# Patient Record
Sex: Female | Born: 1960
Health system: Southern US, Community
[De-identification: ages and names within clinical notes are randomized; demographics above are authoritative.]

## PROBLEM LIST (undated history)

## (undated) DIAGNOSIS — Z8051 Family history of malignant neoplasm of kidney: Secondary | ICD-10-CM

## (undated) DIAGNOSIS — Z9221 Personal history of antineoplastic chemotherapy: Secondary | ICD-10-CM

## (undated) DIAGNOSIS — K219 Gastro-esophageal reflux disease without esophagitis: Secondary | ICD-10-CM

## (undated) DIAGNOSIS — R091 Pleurisy: Secondary | ICD-10-CM

## (undated) DIAGNOSIS — Z923 Personal history of irradiation: Secondary | ICD-10-CM

## (undated) DIAGNOSIS — E785 Hyperlipidemia, unspecified: Secondary | ICD-10-CM

## (undated) DIAGNOSIS — M810 Age-related osteoporosis without current pathological fracture: Secondary | ICD-10-CM

## (undated) DIAGNOSIS — R519 Headache, unspecified: Secondary | ICD-10-CM

## (undated) DIAGNOSIS — Z808 Family history of malignant neoplasm of other organs or systems: Secondary | ICD-10-CM

## (undated) DIAGNOSIS — C50919 Malignant neoplasm of unspecified site of unspecified female breast: Secondary | ICD-10-CM

## (undated) DIAGNOSIS — D649 Anemia, unspecified: Secondary | ICD-10-CM

## (undated) HISTORY — DX: Family history of malignant neoplasm of kidney: Z80.51

## (undated) HISTORY — DX: Hyperlipidemia, unspecified: E78.5

## (undated) HISTORY — DX: Family history of malignant neoplasm of other organs or systems: Z80.8

## (undated) HISTORY — PX: TUBAL LIGATION: SHX77

## (undated) HISTORY — DX: Age-related osteoporosis without current pathological fracture: M81.0

---

## 2015-05-13 DIAGNOSIS — R0602 Shortness of breath: Secondary | ICD-10-CM | POA: Insufficient documentation

## 2015-05-13 DIAGNOSIS — R079 Chest pain, unspecified: Secondary | ICD-10-CM | POA: Insufficient documentation

## 2018-02-23 HISTORY — PX: FRACTURE SURGERY: SHX138

## 2018-03-29 ENCOUNTER — Other Ambulatory Visit: Payer: Self-pay | Admitting: Obstetrics and Gynecology

## 2018-03-29 DIAGNOSIS — N6320 Unspecified lump in the left breast, unspecified quadrant: Secondary | ICD-10-CM

## 2018-03-30 ENCOUNTER — Other Ambulatory Visit: Payer: Self-pay | Admitting: Obstetrics and Gynecology

## 2018-03-30 DIAGNOSIS — N6320 Unspecified lump in the left breast, unspecified quadrant: Secondary | ICD-10-CM

## 2018-04-01 ENCOUNTER — Other Ambulatory Visit: Payer: Self-pay | Admitting: Obstetrics and Gynecology

## 2018-04-01 ENCOUNTER — Ambulatory Visit
Admission: RE | Admit: 2018-04-01 | Discharge: 2018-04-01 | Disposition: A | Discharge status: Home / Self Care | Payer: BLUE CROSS/BLUE SHIELD | Source: Ambulatory Visit | Attending: Obstetrics and Gynecology | Admitting: Obstetrics and Gynecology

## 2018-04-01 DIAGNOSIS — N6320 Unspecified lump in the left breast, unspecified quadrant: Secondary | ICD-10-CM

## 2018-04-01 DIAGNOSIS — R599 Enlarged lymph nodes, unspecified: Secondary | ICD-10-CM

## 2018-04-06 ENCOUNTER — Ambulatory Visit
Admission: RE | Admit: 2018-04-06 | Discharge: 2018-04-06 | Disposition: A | Discharge status: Home / Self Care | Payer: BLUE CROSS/BLUE SHIELD | Source: Ambulatory Visit | Attending: Obstetrics and Gynecology | Admitting: Obstetrics and Gynecology

## 2018-04-06 ENCOUNTER — Other Ambulatory Visit: Payer: Self-pay | Admitting: Obstetrics and Gynecology

## 2018-04-06 DIAGNOSIS — N6320 Unspecified lump in the left breast, unspecified quadrant: Secondary | ICD-10-CM

## 2018-04-06 DIAGNOSIS — R599 Enlarged lymph nodes, unspecified: Secondary | ICD-10-CM

## 2018-04-08 ENCOUNTER — Encounter: Payer: Self-pay | Admitting: *Deleted

## 2018-04-08 ENCOUNTER — Telehealth: Payer: Self-pay | Admitting: Oncology

## 2018-04-08 NOTE — Telephone Encounter (Signed)
Spoke with patient to confirm morning [facility] appointment for 2/19, packet emailed to patient

## 2018-04-11 ENCOUNTER — Other Ambulatory Visit: Payer: Self-pay | Admitting: *Deleted

## 2018-04-11 DIAGNOSIS — C50412 Malignant neoplasm of upper-outer quadrant of left female breast: Secondary | ICD-10-CM

## 2018-04-11 DIAGNOSIS — Z171 Estrogen receptor negative status [ER-]: Principal | ICD-10-CM

## 2018-04-12 NOTE — Progress Notes (Signed)
[facility]  [phone] [phone]    ID: Kathryn [name] DOB: October 03, 1960  MR#: [id number]  GYF#:[id number]  Patient Care Team: Kandace Blitz, MD as PCP - General (Obstetrics and Gynecology) Rockwell Germany, RN as Oncology Nurse Navigator Mauro Kaufmann, RN as Oncology Nurse Navigator Erroll Luna, MD as Consulting Physician (General Surgery) Dylin Ihnen, Virgie Dad, MD as Consulting Physician (Oncology) Kyung Rudd, MD as Consulting Physician (Radiation Oncology) OTHER MD: Rana Snare (OBGYN)   CHIEF COMPLAINT: Triple negative breast cancer  CURRENT TREATMENT: Neoadjuvant chemotherapy   HISTORY OF CURRENT ILLNESS: Kathryn [name] presented with a palpable subareolar left breast lump, nipple retraction, and discharge for approximately 1 week. She underwent bilateral diagnostic mammography with tomography and left breast ultrasonography at [facility] on 04/01/2018 showing: Breast Density Category C. An irregular hyperdense mass is seen in the left subareolar region. An additional oval, circumscribed mass is seen in the inferior central aspect at posterior depth. No additional suspicious findings are identified within the remainder of the left breast. On physical exam, there is a 2-3 cm firm, fixed lump in the subareolar region on the left. Subtle skin changes and retraction is noted along the left nipple. Targeted ultrasound is performed, showing an irregular hypoechoic mass with associated vascularity at the 12 o'clock retroareolar position on the left. Overall measurements are 2.6 x 2.3 x 1.2 cm. This corresponds with the mammographic finding. Two adjacent circumscribed hypoechoic masses are identified in the deep 6 o'clock position 2 cm from the nipple. They measure 0.8 x 0.6 x 0.4 cm and 1.3 x 1.1 x 0.7 cm. There is no seated vascularity. This corresponds with the additional mammographic finding and likely represents minimally complicated cyst. Evaluation  of the left axilla demonstrates a markedly enlarged abnormal lymph node with complete hilar replacement. It measures up to 5 cm in long axis dimension. No suspicious mammographic findings on the right.   Accordingly on 04/06/2018 she proceeded to biopsy of the left breast area in question. The pathology from this procedure showed ([id number]): invasive ductal carcinoma, grade III, with lymphovascular invasion. Prognostic indicators significant for: estrogen receptor, 0% negative and progesterone receptor, 0% negative. Proliferation marker Ki67 at 70%. HER2 equivocal (2+) by immunohistochemistry, but negative by FISH (print3ed report pending).  On the same day she underwent a biopsy of the left axillary lymph node on 04/06/2018 showing ([id number]): metastatic carcinoma in 1 of 1 lymph node (1/1).   The patient's subsequent history is as detailed below.   INTERVAL HISTORY: Kathryn [name] was evaluated in the multidisciplinary breast cancer clinic on 04/13/2018 accompanied by her husband, Kathryn [name], and her mother-in-law.   Her case was also presented at the multidisciplinary breast cancer conference on the same day. At that time a preliminary plan was proposed: Neoadjuvant chemotherapy, breast conserving surgery with targeted axillary dissection, port placement, adjuvant radiation, genetics, and referral to the upbeat trial     REVIEW OF SYSTEMS: Kynzley denies unusual headaches, visual changes, nausea, vomiting, stiff neck, dizziness, or gait imbalance. There has been no cough, phlegm production, or pleurisy, no chest pain or pressure, and no change in bowel or bladder habits. The patient denies fever, rash, bleeding, unexplained fatigue or unexplained weight loss. A detailed review of systems was otherwise entirely negative.   PAST MEDICAL HISTORY: No past medical history on file.   PAST SURGICAL HISTORY: Past Surgical History:  Procedure Laterality Date  . CESAREAN SECTION       FAMILY  HISTORY: Family History  Problem Relation  Age of Onset  . Kidney cancer Sister    Chennel's father died from congestive heart failure at age 48. Patients' mother is 56 as of 03/2018. The patient has 1 brother and 3 sisters. Patient denies anyone in her family having breast, ovarian, prostate, or pancreatic cancer. Kathryn's sister, Dyann [name], was diagnosed with Kidney Cancer at 12. Kathryn [name] has an uncle and a cousin that were diagnosed with lung cancer, but they were both heavy smokers.    GYNECOLOGIC HISTORY:  No LMP recorded. Patient is postmenopausal. 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 Her husband, Kathryn [name], owns [employer]. Together, they have three children, Kathryn [name], Kathryn [name], and Kathryn [name]. 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 Kathryn [name] is 74, lives in [place], and is a Equities trader at [name]. 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    ADVANCED DIRECTIVES: Kathryn [name]'s husband, Kathryn [name], is automatically her healthcare power of attorney.    HEALTH MAINTENANCE: Social History   Tobacco Use  . Smoking status: Never Smoker  Substance Use Topics  . Alcohol use: Not on file  . Drug use: Never    Colonoscopy: no  PAP: 2015  Bone density: no   No Known Allergies  No current outpatient medications on file.   No current facility-administered medications for this visit.      OBJECTIVE: Middle-aged white woman in no acute distress  Vitals:   04/13/18 0850  BP: (!) 156/79  Pulse: 91  Resp: 18  Temp: (!) 97.5 F (36.4 C)  SpO2: 100%     Body mass index is 27.27 kg/m.   Wt Readings from Last 3 Encounters:   04/13/18 160 lb 1.6 oz (72.6 kg)      ECOG FS:0 - Asymptomatic  Ocular: Sclerae unicteric, pupils round and equal Ear-nose-throat: Oropharynx clear and moist Lymphatic: No cervical or supraclavicular adenopathy Lungs no rales or rhonchi Heart regular rate and rhythm Abd soft, nontender, positive bowel sounds MSK no focal spinal tenderness, no joint edema Neuro: non-focal, well-oriented, appropriate affect Breasts: The right breast is unremarkable.  The left breast is imaged below.  There is considerable firmness in the upper outer quadrant area without well-defined edges.  Some of this may be secondary to the recent biopsy.  I do not palpate the axillary lymph node on the left.  Left breast 04/13/2018      LAB RESULTS:  CMP     Component Value Date/Time   NA 142 04/13/2018 0843   K 3.9 04/13/2018 0843   CL 106 04/13/2018 0843   CO2 28 04/13/2018 0843   GLUCOSE 92 04/13/2018 0843   BUN 9 04/13/2018 0843   CREATININE 0.83 04/13/2018 0843   CALCIUM 9.6 04/13/2018 0843   PROT 7.6 04/13/2018 0843   ALBUMIN 4.0 04/13/2018 0843   AST 15 04/13/2018 0843   ALT 16 04/13/2018 0843   ALKPHOS 106 04/13/2018 0843   BILITOT 0.5 04/13/2018 0843   GFRNONAA >60 04/13/2018 0843   GFRAA >60 04/13/2018 0843    No results found for: TOTALPROTELP, ALBUMINELP, A1GS, A2GS, BETS, BETA2SER, GAMS, MSPIKE, SPEI  No results found for: KPAFRELGTCHN, LAMBDASER, KAPLAMBRATIO  Lab Results  Component Value Date   WBC 5.2 04/13/2018   NEUTROABS 3.1 04/13/2018   HGB 13.7 04/13/2018   HCT 42.7 04/13/2018   MCV 91.6 04/13/2018   PLT 280 04/13/2018    '@LASTCHEMISTRY' @  No results found for: LABCA2  No components found for: GBMSXJ155  No results for input(s): INR in the last 168 hours.  No results found for: LABCA2  No results found for: MCE022  No results found for: VVK122  No results found for: ESL753  No results found for: CA2729  No components found for: HGQUANT  No results  found for: CEA1 / No results found for: CEA1   No results found for: AFPTUMOR  No results found for: CHROMOGRNA  No results found for: PSA1  Appointment on 04/13/2018  Component Date Value Ref Range Status  . Sodium 04/13/2018 142  135 - 145 mmol/L Final  . Potassium 04/13/2018 3.9  3.5 - 5.1 mmol/L Final  . Chloride 04/13/2018 106  98 - 111 mmol/L Final  . CO2 04/13/2018 28  22 - 32 mmol/L Final  . Glucose, Bld 04/13/2018 92  70 - 99 mg/dL Final  . BUN 04/13/2018 9  6 - 20 mg/dL Final  . Creatinine 04/13/2018 0.83  0.44 - 1.00 mg/dL Final  . Calcium 04/13/2018 9.6  8.9 - 10.3 mg/dL Final  . Total Protein 04/13/2018 7.6  6.5 - 8.1 g/dL Final  . Albumin 04/13/2018 4.0  3.5 - 5.0 g/dL Final  . AST 04/13/2018 15  15 - 41 U/L Final  . ALT 04/13/2018 16  0 - 44 U/L Final  . Alkaline Phosphatase 04/13/2018 106  38 - 126 U/L Final  . Total Bilirubin 04/13/2018 0.5  0.3 - 1.2 mg/dL Final  . GFR, Est Non Af Am 04/13/2018 >60  >60 mL/min Final  . GFR, Est AFR Am 04/13/2018 >60  >60 mL/min Final  . Anion gap 04/13/2018 8  5 - 15 Final   Performed at [facility], [place] [address], [place], [place] [address]  . WBC Count 04/13/2018 5.2  4.0 - 10.5 K/uL Final  . RBC 04/13/2018 4.66  3.87 - 5.11 MIL/uL Final  . Hemoglobin 04/13/2018 13.7  12.0 - 15.0 g/dL Final  . HCT 04/13/2018 42.7  36.0 - 46.0 % Final  . MCV 04/13/2018 91.6  80.0 - 100.0 fL Final  . MCH 04/13/2018 29.4  26.0 - 34.0 pg Final  . MCHC 04/13/2018 32.1  30.0 - 36.0 g/dL Final  . RDW 04/13/2018 13.0  11.5 - 15.5 % Final  . Platelet Count 04/13/2018 280  150 - 400 K/uL Final  . nRBC 04/13/2018 0.0  0.0 - 0.2 % Final  . Neutrophils Relative % 04/13/2018 61  % Final  . Neutro Abs 04/13/2018 3.1  1.7 - 7.7 K/uL Final  . Lymphocytes Relative 04/13/2018 28  % Final  . Lymphs Abs 04/13/2018 1.5  0.7 - 4.0 K/uL Final  . Monocytes Relative 04/13/2018 9  % Final  . Monocytes Absolute 04/13/2018 0.5  0.1 - 1.0  K/uL Final  . Eosinophils Relative 04/13/2018 1  % Final  . Eosinophils Absolute 04/13/2018 0.1  0.0 - 0.5 K/uL Final  . Basophils Relative 04/13/2018 1  % Final  . Basophils Absolute 04/13/2018 0.1  0.0 - 0.1 K/uL Final  . Immature Granulocytes 04/13/2018 0  % Final  . Abs Immature Granulocytes 04/13/2018 0.01  0.00 - 0.07 K/uL Final   Performed at [facility], [address] [place], [place] [address]    (this displays the last labs from the last 3 days)  No results found for: TOTALPROTELP, ALBUMINELP, A1GS, A2GS, BETS, BETA2SER, GAMS, MSPIKE, SPEI (this displays SPEP labs)  No results found for: KPAFRELGTCHN, LAMBDASER, KAPLAMBRATIO (kappa/lambda light chains)  No results found for: HGBA, HGBA2QUANT, HGBFQUANT, HGBSQUAN (Hemoglobinopathy evaluation)   No results found for: LDH  No results found for: IRON, TIBC, IRONPCTSAT (Iron and TIBC)  No results found for: FERRITIN  Urinalysis No results found for: COLORURINE, APPEARANCEUR, LABSPEC, PHURINE, GLUCOSEU, HGBUR, BILIRUBINUR, KETONESUR, PROTEINUR, UROBILINOGEN, NITRITE, LEUKOCYTESUR   STUDIES:  US Breast Ltd Uni Left Inc Axilla  Result Date: 04/01/2018 CLINICAL DATA:  58 year old female with palpable subareolar left breast lump, nipple retraction and discharge for approximately 1 week. EXAM: DIGITAL DIAGNOSTIC BILATERAL MAMMOGRAM WITH CAD AND TOMO ULTRASOUND LEFT BREAST COMPARISON:  Previous exam(s). ACR Breast Density Category c: The breast tissue is heterogeneously dense, which may obscure small masses. FINDINGS: An irregular hyperdense mass is seen in the left subareolar region. An additional oval, circumscribed mass is seen in the inferior central aspect at posterior depth. No additional suspicious findings are identified within the remainder of the left breast. No suspicious mammographic findings are noted on the right. Mammographic images were processed with CAD. On physical exam, there is a 2-3 cm  firm, fixed lump in the subareolar region on the left. Subtle skin changes and retraction is noted along the left nipple. Targeted ultrasound is performed, showing an irregular hypoechoic mass with associated vascularity at the 12 o'clock retroareolar position on the left. Overall measurements are 2.6 x 2.3 x 1.2 cm. This corresponds with the mammographic finding. Two adjacent circumscribed hypoechoic masses are identified in the deep 6 o'clock position 2 cm from the nipple. They measure 8 x 6 x 4 mm and 1.3 x 1.1 x 0.7 cm. There is no seated vascularity. This corresponds with the additional mammographic finding and likely represents minimally complicated cyst. Evaluation of the left axilla demonstrates a markedly enlarged abnormal lymph node with complete hilar replacement. It measures up to 5 cm in long axis dimension. IMPRESSION: 1. Highly suspicious retroareolar left breast mass. 2. Indeterminate masses at the 6 o'clock position in the left breast, possibly representing complicated cysts. 3. Suspicious left axillary lymphadenopathy. 4. No mammographic evidence of malignancy on the right. RECOMMENDATION: Recommend ultrasound-guided biopsies of the suspicious subareolar left breast mass and left axillary lymphadenopathy. Ultrasound-guided aspiration of the 2 additional masses at the 6 o'clock position on the left is recommended. If these areas do not aspirate to completion, additional ultrasound-guided biopsy is recommended. I have discussed the findings and recommendations with the patient. Results were also provided in writing at the conclusion of the visit. If applicable, a reminder letter will be sent to the patient regarding the next appointment. BI-RADS CATEGORY  5: Highly suggestive of malignancy. Electronically Signed   By: Kristopher Oppenheim M.D.   On: 04/01/2018 13:01   Mm Diag Breast Tomo Bilateral  Result Date: 04/01/2018 CLINICAL DATA:  58 year old female with palpable subareolar left breast lump,  nipple retraction and discharge for approximately 1 week. EXAM: DIGITAL DIAGNOSTIC BILATERAL MAMMOGRAM WITH CAD AND TOMO ULTRASOUND LEFT BREAST COMPARISON:  Previous exam(s). ACR Breast Density Category c: The breast tissue is heterogeneously dense, which may obscure small masses. FINDINGS: An irregular hyperdense mass is seen in the left subareolar region. An additional oval, circumscribed mass is seen in the inferior central aspect at posterior depth. No additional suspicious  findings are identified within the remainder of the left breast. No suspicious mammographic findings are noted on the right. Mammographic images were processed with CAD. On physical exam, there is a 2-3 cm firm, fixed lump in the subareolar region on the left. Subtle skin changes and retraction is noted along the left nipple. Targeted ultrasound is performed, showing an irregular hypoechoic mass with associated vascularity at the 12 o'clock retroareolar position on the left. Overall measurements are 2.6 x 2.3 x 1.2 cm. This corresponds with the mammographic finding. Two adjacent circumscribed hypoechoic masses are identified in the deep 6 o'clock position 2 cm from the nipple. They measure 8 x 6 x 4 mm and 1.3 x 1.1 x 0.7 cm. There is no seated vascularity. This corresponds with the additional mammographic finding and likely represents minimally complicated cyst. Evaluation of the left axilla demonstrates a markedly enlarged abnormal lymph node with complete hilar replacement. It measures up to 5 cm in long axis dimension. IMPRESSION: 1. Highly suspicious retroareolar left breast mass. 2. Indeterminate masses at the 6 o'clock position in the left breast, possibly representing complicated cysts. 3. Suspicious left axillary lymphadenopathy. 4. No mammographic evidence of malignancy on the right. RECOMMENDATION: Recommend ultrasound-guided biopsies of the suspicious subareolar left breast mass and left axillary lymphadenopathy. Ultrasound-guided  aspiration of the 2 additional masses at the 6 o'clock position on the left is recommended. If these areas do not aspirate to completion, additional ultrasound-guided biopsy is recommended. I have discussed the findings and recommendations with the patient. Results were also provided in writing at the conclusion of the visit. If applicable, a reminder letter will be sent to the patient regarding the next appointment. BI-RADS CATEGORY  5: Highly suggestive of malignancy. Electronically Signed   By: Kristopher Oppenheim M.D.   On: 04/01/2018 13:01   [place] Axillary Node Core Biopsy Left  Addendum Date: 04/08/2018   ADDENDUM REPORT: 04/08/2018 07:15 ADDENDUM: Pathology revealed GRADE III INVASIVE DUCTAL CARCINOMA, LYMPHOVASCULAR INVASION IS IDENTIFIED of the Left breast, 12 o'clock, retroareolar mass. METASTATIC CARCINOMA of the Left axillary lymph node. This was found to be concordant by Dr. Lajean Manes. Pathology results were discussed with the patient and her husband by telephone. The patient reported doing well after the biopsies with tenderness at the sites. Post biopsy instructions and care were reviewed and questions were answered. The patient was encouraged to call [facility] for any additional concerns. The patient was referred to [facility] on April 13, 2018. Pathology results reported by Terie Purser, RN on 04/08/2018. Electronically Signed   By: Lajean Manes M.D.   On: 04/08/2018 07:15   Result Date: 04/08/2018 CLINICAL DATA:  Patient presents for ultrasound-guided core needle biopsy a palpable retroareolar breast mass well as an abnormal left axillary lymph node/mass. EXAM: ULTRASOUND GUIDED LEFT BREAST CORE NEEDLE BIOPSY ULTRASOUND GUIDED LEFT AXILLA CORE NEEDLE BIOPSY COMPARISON:  Previous exam(s). FINDINGS: I met with the patient and we discussed the procedure of ultrasound-guided biopsy, including  benefits and alternatives. We discussed the high likelihood of a successful procedure. We discussed the risks of the procedure, including infection, bleeding, tissue injury, clip migration, and inadequate sampling. Informed written consent was given. The usual time-out protocol was performed immediately prior to the procedure. Lesion #1 quadrant: 12 o'clock, retroareolar, slightly outer quadrant. Using sterile technique and 1% Lidocaine as local anesthetic, under direct ultrasound visualization, a 12 gauge spring-loaded device was used to  perform biopsy of the retroareolar, 12 o'clock position left breast mass using a medial approach. At the conclusion of the procedure a ribbon shaped tissue marker clip was deployed into the biopsy cavity. Lesion #2: Inferior left axilla. Using sterile technique and 1% Lidocaine as local anesthetic, under direct ultrasound visualization, a 14 gauge spring-loaded device was used to perform biopsy of left axillary mass/enlarged abnormal lymph node using an inferior approach. At the conclusion of the procedure a HydroMARK tissue marker clip was deployed into the biopsy cavity. Follow up 2 view mammogram was performed and dictated separately. IMPRESSION: Ultrasound guided biopsy of a 12 o'clock position retroareolar left breast mass and a left axillary mass/enlarged abnormal lymph node. No apparent complications. Electronically Signed: By: Lajean Manes M.D. On: 04/06/2018 14:37   Mm Clip Placement Left  Result Date: 04/06/2018 CLINICAL DATA:  Status post ultrasound-guided core needle biopsy of a retroareolar, 12 o'clock position, left breast mass and a left axillary mass/enlarged abnormal lymph node. EXAM: DIAGNOSTIC LEFT MAMMOGRAM POST ULTRASOUND BIOPSY COMPARISON:  Previous exam(s). FINDINGS: Mammographic images were obtained following ultrasound guided biopsy of a retroareolar, 12 o'clock position left breast mass as well as left axillary mass/enlarged abnormal lymph node. The  ribbon shaped biopsy clip lies within the retroareolar mass. The HydroMARK clip and the axillary mass could not be visualized mammographically. The clip was well seen within the left axillary mass/enlarged lymph node on ultrasound. IMPRESSION: Well-positioned ribbon shaped biopsy clip within the retroareolar left breast mass. Final Assessment: Post Procedure Mammograms for Marker Placement Electronically Signed   By: Lajean Manes M.D.   On: 04/06/2018 14:51   [place] Lt Breast Bx W Loc Dev 1st Lesion Img Bx Spec US Guide  Addendum Date: 04/08/2018   ADDENDUM REPORT: 04/08/2018 07:15 ADDENDUM: Pathology revealed GRADE III INVASIVE DUCTAL CARCINOMA, LYMPHOVASCULAR INVASION IS IDENTIFIED of the Left breast, 12 o'clock, retroareolar mass. METASTATIC CARCINOMA of the Left axillary lymph node. This was found to be concordant by Dr. Lajean Manes. Pathology results were discussed with the patient and her husband by telephone. The patient reported doing well after the biopsies with tenderness at the sites. Post biopsy instructions and care were reviewed and questions were answered. The patient was encouraged to call [facility] for any additional concerns. The patient was referred to [facility] on April 13, 2018. Pathology results reported by Terie Purser, RN on 04/08/2018. Electronically Signed   By: Lajean Manes M.D.   On: 04/08/2018 07:15   Result Date: 04/08/2018 CLINICAL DATA:  Patient presents for ultrasound-guided core needle biopsy a palpable retroareolar breast mass well as an abnormal left axillary lymph node/mass. EXAM: ULTRASOUND GUIDED LEFT BREAST CORE NEEDLE BIOPSY ULTRASOUND GUIDED LEFT AXILLA CORE NEEDLE BIOPSY COMPARISON:  Previous exam(s). FINDINGS: I met with the patient and we discussed the procedure of ultrasound-guided biopsy, including benefits and alternatives. We discussed the high likelihood  of a successful procedure. We discussed the risks of the procedure, including infection, bleeding, tissue injury, clip migration, and inadequate sampling. Informed written consent was given. The usual time-out protocol was performed immediately prior to the procedure. Lesion #1 quadrant: 12 o'clock, retroareolar, slightly outer quadrant. Using sterile technique and 1% Lidocaine as local anesthetic, under direct ultrasound visualization, a 12 gauge spring-loaded device was used to perform biopsy of the retroareolar, 12 o'clock position left breast mass using a medial approach. At the conclusion of the procedure a ribbon shaped  tissue marker clip was deployed into the biopsy cavity. Lesion #2: Inferior left axilla. Using sterile technique and 1% Lidocaine as local anesthetic, under direct ultrasound visualization, a 14 gauge spring-loaded device was used to perform biopsy of left axillary mass/enlarged abnormal lymph node using an inferior approach. At the conclusion of the procedure a HydroMARK tissue marker clip was deployed into the biopsy cavity. Follow up 2 view mammogram was performed and dictated separately. IMPRESSION: Ultrasound guided biopsy of a 12 o'clock position retroareolar left breast mass and a left axillary mass/enlarged abnormal lymph node. No apparent complications. Electronically Signed: By: Lajean Manes M.D. On: 04/06/2018 14:37   US Breast Aspiration Left  Result Date: 04/06/2018 CLINICAL DATA:  Patient has a retroareolar left breast mass and an abnormal left axillary lymph node/mass, which were biopsied. She also has a benign appearing hypo to anechoic left breast mass measuring just over 1 cm with a smaller directly adjacent mass. These were felt likely to be cystic and aspiration was therefore performed. EXAM: ULTRASOUND GUIDED LEFT BREAST CYST ASPIRATION COMPARISON:  Previous exams. PROCEDURE: Using sterile technique, 1% lidocaine, under direct ultrasound visualization, needle  aspiration of the 6 o'clock position, 2 cm from the nipple, mass hand its directly adjacent smaller mass was performed. Both of the masses fully decompressed on aspiration. There is no residual mass. Yellowish fluid was recovered, 1-2 mL. IMPRESSION: Ultrasound-guided aspiration of 2 adjacent, apparently communicating, 6 o'clock position left breast cysts. No apparent complications. RECOMMENDATIONS: 1. No follow-up of the 6 o'clock position cysts indicated. 2. Followup recommendations will be according to the results of the biopsy from the retroareolar left breast mass and the left axillary mass/enlarged lymph node. Electronically Signed   By: Lajean Manes M.D.   On: 04/06/2018 14:40     ELIGIBLE FOR AVAILABLE RESEARCH PROTOCOL: UPBEAT; [id number]   ASSESSMENT: 58 y.o. [place], [place] woman status post left breast upper outer quadrant biopsy 04/06/2018 for a clinical T2 N1, stage IIIB invasive ductal carcinoma, grade 3, triple negative, with an MIB-1 of 70%  (a) staging studies pending  (b) baseline echocardiogram pending  (1) neoadjuvant chemotherapy will consist of doxorubicin and cyclophosphamide in dose dense fashion x4, starting 05/03/2018, followed by weekly paclitaxel and carboplatin x12  (2) definitive surgery to follow  (3) adjuvant radiation after surgery  (4) genetics testing pending   PLAN: I spent approximately 60 minutes face to face with Blia with more than 50% of that time spent in counseling and coordination of care. Specifically we reviewed the biology of the patient's diagnosis and the specifics of her situation. We first reviewed the fact that cancer is not one disease but more than 100 different diseases and that it is important to keep them separate-- otherwise when friends and relatives discuss their own cancer experiences with [place] confusion can result. Similarly we explained that if breast cancer spreads to the bone or liver, the patient would not have bone cancer or liver  cancer, but breast cancer in the bone and breast cancer in the liver: one cancer in three places-- not 3 different cancers which otherwise would have to be treated in 3 different ways.  We discussed the difference between local and systemic therapy. In terms of loco-regional treatment, lumpectomy plus radiation is equivalent to mastectomy as far as survival is concerned. For this reason, and because the cosmetic results are generally superior, we recommend breast conserving surgery.   We also noted that in terms of sequencing of treatments, whether systemic therapy or  surgery is done first does not affect the ultimate outcome.  In her case we recommend starting with chemotherapy, because it will make the definitive surgery easier and increase her chances of keeping her breast with a good cosmetic result, and also will give [place] prognostic information depending on results.  Finally if her results are not very good it will make her eligible for our S 1418 study.  Next we went over the options for systemic therapy which are anti-estrogens, anti-HER-2 immunotherapy, and chemotherapy. Cherly does not meet criteria for anti-HER-2 immunotherapy or anti-estrogens.  Her only option for systemic treatment is chemotherapy and that accordingly is what we recommend.  More specifically she will receive doxorubicin and cyclophosphamide in dose dense fashion x4 followed by carboplatin and paclitaxel weekly x12.  She will need an echocardiogram, port placement, and a visit with our chemotherapy school teaching nurse.  She will see me early March to discuss how to take her supportive medications and we are hoping to start her chemotherapy on 05/03/2018.  Shyra has a good understanding of the overall plan. She agrees with it. She knows the goal of treatment in her case is cure. She will call with any problems that may develop before her next visit here.   Chasten Blaze, Virgie Dad, MD  04/13/18 2:17 PM Medical Oncology and  Hematology [facility] [address] [place], [place] [address] Tel. [phone]    Fax. [phone]   I, Jacqualyn Posey am acting as a Education administrator for Chauncey Cruel, MD.   I, Lurline Del MD, have reviewed the above documentation for accuracy and completeness, and I agree with the above.

## 2018-04-13 ENCOUNTER — Ambulatory Visit: Payer: Self-pay | Admitting: Surgery

## 2018-04-13 ENCOUNTER — Inpatient Hospital Stay: Payer: BLUE CROSS/BLUE SHIELD

## 2018-04-13 ENCOUNTER — Inpatient Hospital Stay: Payer: BLUE CROSS/BLUE SHIELD | Attending: Oncology | Admitting: Oncology

## 2018-04-13 ENCOUNTER — Encounter: Payer: Self-pay | Admitting: *Deleted

## 2018-04-13 ENCOUNTER — Encounter: Payer: Self-pay | Admitting: Oncology

## 2018-04-13 ENCOUNTER — Ambulatory Visit
Admission: RE | Admit: 2018-04-13 | Discharge: 2018-04-13 | Disposition: A | Discharge status: Home / Self Care | Payer: BLUE CROSS/BLUE SHIELD | Source: Ambulatory Visit | Attending: Radiation Oncology | Admitting: Radiation Oncology

## 2018-04-13 ENCOUNTER — Ambulatory Visit: Payer: BLUE CROSS/BLUE SHIELD | Admitting: Physical Therapy

## 2018-04-13 ENCOUNTER — Encounter: Payer: Self-pay | Admitting: Physical Therapy

## 2018-04-13 ENCOUNTER — Other Ambulatory Visit: Payer: Self-pay

## 2018-04-13 VITALS — BP 156/79 | HR 91 | Temp 97.5°F | Resp 18 | Ht 64.25 in | Wt 160.1 lb

## 2018-04-13 DIAGNOSIS — Z171 Estrogen receptor negative status [ER-]: Secondary | ICD-10-CM | POA: Insufficient documentation

## 2018-04-13 DIAGNOSIS — R293 Abnormal posture: Secondary | ICD-10-CM | POA: Insufficient documentation

## 2018-04-13 DIAGNOSIS — C773 Secondary and unspecified malignant neoplasm of axilla and upper limb lymph nodes: Secondary | ICD-10-CM | POA: Insufficient documentation

## 2018-04-13 DIAGNOSIS — C50412 Malignant neoplasm of upper-outer quadrant of left female breast: Secondary | ICD-10-CM

## 2018-04-13 DIAGNOSIS — Z8051 Family history of malignant neoplasm of kidney: Secondary | ICD-10-CM

## 2018-04-13 LAB — CMP (CANCER CENTER ONLY)
ALT: 16 U/L (ref 0–44)
AST: 15 U/L (ref 15–41)
Albumin: 4 g/dL (ref 3.5–5.0)
Alkaline Phosphatase: 106 U/L (ref 38–126)
Anion gap: 8 (ref 5–15)
BUN: 9 mg/dL (ref 6–20)
CO2: 28 mmol/L (ref 22–32)
Calcium: 9.6 mg/dL (ref 8.9–10.3)
Chloride: 106 mmol/L (ref 98–111)
Creatinine: 0.83 mg/dL (ref 0.44–1.00)
GFR, Est AFR Am: >60 mL/min (ref >60)
GFR, Estimated: >60 mL/min (ref >60)
GLUCOSE: 92 mg/dL (ref 70–99)
Potassium: 3.9 mmol/L (ref 3.5–5.1)
Sodium: 142 mmol/L (ref 135–145)
Total Bilirubin: 0.5 mg/dL (ref 0.3–1.2)
Total Protein: 7.6 g/dL (ref 6.5–8.1)

## 2018-04-13 LAB — CBC WITH DIFFERENTIAL (CANCER CENTER ONLY)
Abs Immature Granulocytes: 0.01 10*3/uL (ref 0.00–0.07)
Basophils Absolute: 0.1 10*3/uL (ref 0.0–0.1)
Basophils Relative: 1 %
Eosinophils Absolute: 0.1 10*3/uL (ref 0.0–0.5)
Eosinophils Relative: 1 %
HCT: 42.7 % (ref 36.0–46.0)
Hemoglobin: 13.7 g/dL (ref 12.0–15.0)
Immature Granulocytes: 0 %
LYMPHS PCT: 28 %
Lymphs Abs: 1.5 10*3/uL (ref 0.7–4.0)
MCH: 29.4 pg (ref 26.0–34.0)
MCHC: 32.1 g/dL (ref 30.0–36.0)
MCV: 91.6 fL (ref 80.0–100.0)
Monocytes Absolute: 0.5 10*3/uL (ref 0.1–1.0)
Monocytes Relative: 9 %
Neutro Abs: 3.1 10*3/uL (ref 1.7–7.7)
Neutrophils Relative %: 61 %
Platelet Count: 280 10*3/uL (ref 150–400)
RBC: 4.66 MIL/uL (ref 3.87–5.11)
RDW: 13 % (ref 11.5–15.5)
WBC Count: 5.2 10*3/uL (ref 4.0–10.5)
nRBC: 0 % (ref 0.0–0.2)

## 2018-04-13 NOTE — Therapy (Signed)
[place], [place], [address] Phone: [phone]   Fax:  [phone]  Physical Therapy Evaluation  Patient Details  Name: Kathryn [name] MRN: [id number] Date of Birth: 04-04-1960 Referring Provider (PT): Dr. Erroll Luna   Encounter Date: 04/13/2018  PT End of Session - 04/13/18 1144    Visit Number  1    Number of Visits  1    PT Start Time  1026    PT Stop Time  1050    PT Time Calculation (min)  24 min    Activity Tolerance  Patient tolerated treatment well    Behavior During Therapy  [facility] for tasks assessed/performed       History reviewed. No pertinent past medical history.  Past Surgical History:  Procedure Laterality Date  . CESAREAN SECTION      There were no vitals filed for this visit.   Subjective Assessment - 04/13/18 1101    Subjective  Patient reports she is here today to be seen by her medical team for her newly diagnosed left breast cancer.    Patient is accompained by:  Family member    Pertinent History  Patient was diagnosed on 04/01/2018 with left triple negative invasive ductal carcinoma breast cancer. It measures 2.6 cm and is located in the upper outer quadrant with a Ki67 of 70%.    Patient Stated Goals  Reduce lymphedema risk and learn post op shoulder ROM HEP    Currently in Pain?  No/denies         [facility] PT Assessment - 04/13/18 0001      Assessment   Medical Diagnosis  Left breast cancer    Referring Provider (PT)  Dr. [name] Cornett    Onset Date/Surgical Date  04/01/18    Hand Dominance  Right    Prior Therapy  none      Precautions   Precautions  Other (comment)    Precaution Comments  active cancer      Restrictions   Weight Bearing Restrictions  No      Balance Screen   Has the patient fallen in the past 6 months  No    Has the patient had a decrease in activity level because of a fear of falling?   No    Is the patient reluctant to leave their  home because of a fear of falling?   No      Home Environment   Living Environment  Private residence    Living Arrangements  Spouse/significant other;Children   Husband and 50 y.o. 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 [employer]    Leisure  She walks but inconsistently      Cognition   Overall Cognitive Status  Within Functional Limits for tasks assessed      Posture/Postural Control   Posture/Postural Control  Postural limitations    Postural Limitations  Rounded Shoulders;Forward head      ROM / Strength   AROM / PROM / Strength  AROM;Strength      AROM   AROM Assessment Site  Shoulder;Cervical    Right/Left Shoulder  Right;Left    Right Shoulder Extension  55 Degrees    Right Shoulder Flexion  150 Degrees    Right Shoulder ABduction  155  Degrees    Right Shoulder Internal Rotation  62 Degrees    Right Shoulder External Rotation  84 Degrees    Left Shoulder Extension  59 Degrees    Left Shoulder Flexion  130 Degrees    Left Shoulder ABduction  150 Degrees    Left Shoulder Internal Rotation  60 Degrees    Left Shoulder External Rotation  86 Degrees    Cervical Flexion  WNL    Cervical Extension  WNL    Cervical - Right Side Bend  WNL    Cervical - Left Side Bend  WNL    Cervical - Right Rotation  WNL    Cervical - Left Rotation  WNL      Strength   Overall Strength  Within functional limits for tasks performed        LYMPHEDEMA/ONCOLOGY QUESTIONNAIRE - 04/13/18 1143      Type   Cancer Type  Left breast cancer      Lymphedema Assessments   Lymphedema Assessments  Upper extremities      Right Upper Extremity Lymphedema   10 cm Proximal to Olecranon Process  29.9 cm    Olecranon Process  24.7 cm    10 cm Proximal to Ulnar Styloid Process  23.4 cm    Just Proximal to Ulnar Styloid Process  15.9 cm     Across Hand at [employer]  18.6 cm    At [address] [place] of 2nd Digit  6.1 cm      Left Upper Extremity Lymphedema   10 cm Proximal to Olecranon Process  30.5 cm    Olecranon Process  25.8 cm    10 cm Proximal to Ulnar Styloid Process  22 cm    Just Proximal to Ulnar Styloid Process  15 cm    Across Hand at [employer]  17.9 cm    At [place] of 2nd Digit  5.8 cm          Quick Dash - 04/13/18 0001    Open a tight or new jar  No difficulty    Do heavy household chores (wash walls, wash floors)  No difficulty    Carry a shopping bag or briefcase  No difficulty    Wash your back  No difficulty    Use a knife to cut food  No difficulty    Recreational activities in which you take some force or impact through your arm, shoulder, or hand (golf, hammering, tennis)  No difficulty    During the past week, to what extent has your arm, shoulder or hand problem interfered with your normal social activities with family, friends, neighbors, or groups?  Not at all    During the past week, to what extent has your arm, shoulder or hand problem limited your work or other regular daily activities  Not at all    Arm, shoulder, or hand pain.  None    Tingling (pins and needles) in your arm, shoulder, or hand  None    Difficulty Sleeping  No difficulty    DASH Score  0 %        Objective measurements completed on examination: See above findings.        Patient was instructed today in a home exercise program today for post op shoulder range of motion. These included active assist shoulder flexion in sitting, scapular retraction, wall walking with shoulder abduction, and hands behind head external rotation.  She was encouraged to do these twice a  day, holding 3 seconds and repeating 5 times when permitted by her physician.          PT Education - 04/13/18 1144    Education Details  Lymphedema risk reduction and post op shoulder ROM HEP    Person(s) Educated  Patient;Spouse    Methods   Explanation;Demonstration;Handout    Comprehension  Returned demonstration;Verbalized understanding           Breast Clinic Goals - 04/13/18 1147      Patient will be able to verbalize understanding of pertinent lymphedema risk reduction practices relevant to her diagnosis specifically related to skin care.   Time  1    Period  Days    Status  Achieved      Patient will be able to return demonstrate and/or verbalize understanding of the post-op home exercise program related to regaining shoulder range of motion.   Time  1    Period  Days    Status  Achieved      Patient will be able to verbalize understanding of the importance of attending the postoperative After Breast Cancer Class for further lymphedema risk reduction education and therapeutic exercise.   Time  1    Period  Days    Status  Achieved            Plan - 04/13/18 1145    Clinical Impression Statement  Patient was diagnosed on 04/01/2018 with left triple negative invasive ductal carcinoma breast cancer. It measures 2.6 cm and is located in the upper outer quadrant with a Ki67 of 70%. Her multidisciplinary medical team met prior to her assessments to determine a recommended treatment plan. She is planning to have neoadjuvant chemotherapy followed by a left lumpectomy and targeted node dissection and radiation. She will benefit from a post op PT visit to determine needs.    History and Personal Factors relevant to plan of care:  None    Clinical Presentation  Stable    Clinical Decision Making  Low    Rehab Potential  Excellent    Clinical Impairments Affecting Rehab Potential  None    PT Frequency  One time visit    PT Treatment/Interventions  ADLs/Self Care Home Management;Patient/family education;Therapeutic exercise    PT Next Visit Plan  Will reassess if referred by MD post op    PT Home Exercise Plan  Post op shoulder ROM HEP    Consulted and Agree with Plan of Care  Patient;Family member/caregiver     Family Member Consulted  Husband       Patient will benefit from skilled therapeutic intervention in order to improve the following deficits and impairments:  Decreased range of motion, Impaired UE functional use, Pain, Decreased knowledge of precautions, Postural dysfunction  Visit Diagnosis: Malignant neoplasm of upper-outer quadrant of left breast in female, estrogen receptor negative ([place]) - Plan: PT plan of care cert/re-cert  Abnormal posture - Plan: PT plan of care cert/re-cert   Patient will follow up at outpatient cancer rehab 3-4 weeks following surgery.  If the patient requires physical therapy at that time, a specific plan will be dictated and sent to the referring physician for approval. The patient was educated today on appropriate basic range of motion exercises to begin post operatively and the importance of attending the After Breast Cancer class following surgery.  Patient was educated today on lymphedema risk reduction practices as it pertains to recommendations that will benefit the patient immediately following surgery.  She verbalized good understanding.  Problem List Patient Active Problem List   Diagnosis Date Noted  . Malignant neoplasm of upper-outer quadrant of left breast in female, estrogen receptor negative ([place]) 04/11/2018   Annia Friendly, PT 04/13/18 11:49 AM  [place], [place], [address] Phone: [phone]   Fax:  [phone]  Name: Kathryn [name] MRN: [id number] Date of Birth: Dec 12, 1960

## 2018-04-13 NOTE — Progress Notes (Signed)
Nutrition Assessment  Reason for Assessment:  Pt seen in Breast Clinic  ASSESSMENT:   58 year old female with new diagnosis of breast cancer, triple negative.  Past medical history reviewed.  Patient reports normal appetite.  Medications:  reviewed  Labs: reviewed  Anthropometrics:   Height: 64.25 inches Weight: 160 lb  BMI: 27   NUTRITION DIAGNOSIS: Food and nutrition related knowledge deficit related to new diagnosis of breast cancer as evidenced by no prior need for nutrition related information.  INTERVENTION:   Discussed and provided packet of information regarding nutritional tips for breast cancer patients.  Questions answered.  Teachback method used.  Contact information provided and patient knows to contact me with questions/concerns.    MONITORING, EVALUATION, and GOAL: Pt will consume a healthy plant based diet to maintain lean body mass throughout treatment.   Deddrick Saindon B. Zenia Resides, [place], [place] Registered Dietitian [phone] (pager)

## 2018-04-13 NOTE — H&P (Signed)
Kathryn [name] Documented: 04/13/2018 7:24 AM Location: [facility] Patient #: [id number] DOB: 1960/07/02 Undefined / Language: Suszanne [name] / Race: White Female  History of Present Illness Kathryn Moores A. Isiac Breighner MD; 04/13/2018 11:05 AM) Patient words: Pt sent at the request of Dr Kathryn [name] for 1 week history of left breast mass and nipple dischage. No significant pain and more sore since biopsy.  path Triple negative    No family history of breast cancer.  The patient is a 58 year old female.   Past Surgical History Kathryn Pummel, RN; 04/13/2018 7:24 AM) Breast Biopsy Left. Cesarean Section - Multiple  Diagnostic Studies History Kathryn Pummel, RN; 04/13/2018 7:24 AM) Colonoscopy never Mammogram within last year Pap Smear 1-5 years ago  Medication History Kathryn Pummel, RN; 04/13/2018 7:24 AM) Medications Reconciled  Social History Kathryn Pummel, RN; 04/13/2018 7:24 AM) Alcohol use Occasional alcohol use. Caffeine use Carbonated beverages, Coffee, Tea. No drug use Tobacco use Former smoker.  Family History Kathryn Pummel, RN; 04/13/2018 7:24 AM) Alcohol Abuse Father. Arthritis Mother. Breast Cancer Sister. Diabetes Mellitus Father. Heart Disease Father. Kidney Disease Sister. Melanoma Sister. Respiratory Condition Father.  Pregnancy / Birth History Kathryn Pummel, RN; 04/13/2018 7:24 AM) Age at menarche 39 years. Age of menopause 53-55 Contraceptive History Oral contraceptives. Gravida 3 Irregular periods Maternal age 49-35  Other Problems Kathryn Pummel, RN; 04/13/2018 7:24 AM) Anxiety Disorder Chest pain Migraine Headache     Review of Systems Kathryn Spillers Ledford RN; 04/13/2018 7:24 AM) General Not Present- Appetite Loss, Chills, Fatigue, Fever, Night Sweats, Weight Gain and Weight Loss. Skin Not Present- Change in Wart/Mole, Dryness, Hives, Jaundice, New Lesions, Non-Healing Wounds, Rash and Ulcer. HEENT Not Present-  Earache, Hearing Loss, Hoarseness, Nose Bleed, Oral Ulcers, Ringing in the Ears, Seasonal Allergies, Sinus Pain, Sore Throat, Visual Disturbances, Wears glasses/contact lenses and Yellow Eyes. Respiratory Not Present- Bloody sputum, Chronic Cough, Difficulty Breathing, Snoring and Wheezing. Breast Present- Skin Changes. Not Present- Breast Mass, Breast Pain and Nipple Discharge. Cardiovascular Not Present- Chest Pain, Difficulty Breathing Lying Down, Leg Cramps, Palpitations, Rapid Heart Rate, Shortness of Breath and Swelling of Extremities. Gastrointestinal Present- Chronic diarrhea. Not Present- Abdominal Pain, Bloating, Bloody Stool, Change in Bowel Habits, Constipation, Difficulty Swallowing, Excessive gas, Gets full quickly at meals, Hemorrhoids, Indigestion, Nausea, Rectal Pain and Vomiting. Female Genitourinary Not Present- Frequency, Nocturia, Painful Urination, Pelvic Pain and Urgency. Musculoskeletal Not Present- Back Pain, Joint Pain, Joint Stiffness, Muscle Pain, Muscle Weakness and Swelling of Extremities. Neurological Not Present- Decreased Memory, Fainting, Headaches, Numbness, Seizures, Tingling, Tremor, Trouble walking and Weakness. Psychiatric Not Present- Anxiety, Bipolar, Change in Sleep Pattern, Depression, Fearful and Frequent crying. Endocrine Present- Hot flashes. Not Present- Cold Intolerance, Excessive Hunger, Hair Changes, Heat Intolerance and New Diabetes.   Physical Exam (Kathryn [name] A. Kathryn Tibbs MD; 04/13/2018 11:07 AM)  General Mental Status-Alert. General Appearance-Consistent with stated age. Hydration-Well hydrated. Voice-Normal.  Head and Neck Head-normocephalic, atraumatic with no lesions or palpable masses. Trachea-midline. Thyroid Gland Characteristics - normal size and consistency.  Eye Eyeball - Bilateral-Extraocular movements intact. Sclera/Conjunctiva - Bilateral-No scleral icterus.  Chest and Lung Exam Chest and lung exam reveals  -quiet, even and easy respiratory effort with no use of accessory muscles and on auscultation, normal breath sounds, no adventitious sounds and normal vocal resonance. Inspection Chest Wall - Normal. Back - normal.  Breast Note: mass left retroareolar 4 cm nipple involvement right breast no mass  Cardiovascular Cardiovascular examination reveals -normal heart sounds, regular rate and rhythm with no murmurs and normal pedal  pulses bilaterally.  Neurologic Neurologic evaluation reveals -alert and oriented x 3 with no impairment of recent or remote memory. Mental Status-Normal.  Musculoskeletal Normal Exam - Left-Upper Extremity Strength Normal and Lower Extremity Strength Normal. Normal Exam - Right-Upper Extremity Strength Normal and Lower Extremity Strength Normal.  Lymphatic Head & Neck  General Head & Neck Lymphatics: Bilateral - Description - Normal. Axillary  General Axillary Region: Bilateral - Description - Normal. Tenderness - Non Tender.    Assessment & Plan (Kathryn [name] A. Kathryn Hewett MD; 04/13/2018 11:08 AM)  BREAST CANCER, LEFT (C50.912) Impression: neoadjuvant chemotherapy   port placement  Hopefully breast conservation to follow  Pt requires port placement for chemotherapy. Risk include bleeding, infection, pneumothorax, hemothorax, mediastinal injury, nerve injury , blood vessel injury, strke, blood clots, death, migration. embolization and need for additional procedures. Pt agrees to proceed.  Current Plans You are being scheduled for surgery- Our schedulers will call you.  You should hear from our office's scheduling department within 5 working days about the location, date, and time of surgery. We try to make accommodations for patient's preferences in scheduling surgery, but sometimes the OR schedule or the surgeon's schedule prevents [place] from making those accommodations.  If you have not heard from our office [phone]) in 5 working days, call  the office and ask for your surgeon's nurse.  If you have other questions about your diagnosis, plan, or surgery, call the office and ask for your surgeon's nurse.  Pt Education - [facility] Breast Cancer Information Given - [facility] "Breast Journey" Package Use of a central venous catheter for intravenous therapy was discussed. Technique of catheter placement using ultrasound and fluoroscopy guidance was discussed. Risks such as bleeding, infection, pneumothorax, catheter occlusion, reoperation, and other risks were discussed. I noted a good likelihood this will help address the problem. Questions were answered. The patient expressed understanding & wishes to proceed.

## 2018-04-13 NOTE — Patient Instructions (Signed)

## 2018-04-13 NOTE — Progress Notes (Signed)
Radiation Oncology         [phone] ________________________________  Name: Kathryn [name]        MRN: [id number]  Date of Service: 04/13/2018 DOB: [birth year]  CC:Kandace Blitz, MD  Erroll Luna, MD     REFERRING PHYSICIAN: Erroll Luna, MD   DIAGNOSIS: The encounter diagnosis was Malignant neoplasm of upper-outer quadrant of left breast in female, estrogen receptor negative ([place]).   HISTORY OF PRESENT ILLNESS: Kathryn [name] is a 58 y.o. female seen in the multidisciplinary breast clinic for a new diagnosis of left breast cancer. The patient was noted to have a palpable mass with nipple retraction, and nipple discharge for about 1 week. She had diagnostic imaging that revealed a mass at 12:00 measuring 2.6 x 2.3 x 1.2 cm and one enlarged axillary node. She underwent a biopsy on 04/06/2018 that revealed a grade 3 invasive ductal carcinoma with LVSI, and her node was also involved with carcinoma. Her tumor was triple negative with a Ki 67 of 70%. She comes today to discuss options of treatment for her cancer.    PREVIOUS RADIATION THERAPY: No   PAST MEDICAL HISTORY: No past medical history on file.     PAST SURGICAL HISTORY:  C-section x 3  FAMILY HISTORY:  Family History  Problem Relation Age of Onset  . Kidney cancer Sister      SOCIAL HISTORY:  reports that she has never smoked. She does not have any smokeless tobacco history on file. She reports that she does not use drugs. The patient is married and lives in [place]. She has three adult daughters.    ALLERGIES: Patient has no known allergies.   MEDICATIONS:  No current outpatient medications on file.   No current facility-administered medications for this encounter.      REVIEW OF SYSTEMS: On review of systems, the patient reports that she is doing well overall. No complaints are verbalized.   PHYSICAL EXAM:  Wt Readings from Last 3 Encounters:  04/13/18 160 lb 1.6 oz (72.6 kg)   Temp Readings from Last  3 Encounters:  04/13/18 (!) 97.5 F (36.4 C) (Oral)   BP Readings from Last 3 Encounters:  04/13/18 (!) 156/79   Pulse Readings from Last 3 Encounters:  04/13/18 91     In general this is a well appearing caucasian female in no acute distress. She is alert and oriented x4 and appropriate throughout the examination. HEENT reveals that the patient is normocephalic, atraumatic. EOMs are intact. Skin is intact without any evidence of gross lesions. Cardiopulmonary assessment is negative for acute distress and she exhibits normal effort. Bilateral breast exam is deferred.  ECOG = 0  0 - Asymptomatic (Fully active, able to carry on all predisease activities without restriction)  1 - Symptomatic but completely ambulatory (Restricted in physically strenuous activity but ambulatory and able to carry out work of a light or sedentary nature. For example, light housework, office work)  2 - Symptomatic, <50% in bed during the day (Ambulatory and capable of all self care but unable to carry out any work activities. Up and about more than 50% of waking hours)  3 - Symptomatic, >50% in bed, but not bedbound (Capable of only limited self-care, confined to bed or chair 50% or more of waking hours)  4 - Bedbound (Completely disabled. Cannot carry on any self-care. Totally confined to bed or chair)  5 - Death   Eustace Pen MM, Creech RH, Tormey DC, et al. [phone]). "Toxicity and response criteria  of the [facility]". [place] Oncol. 5 (6): 649-55    LABORATORY DATA:  Lab Results  Component Value Date   WBC 5.2 04/13/2018   HGB 13.7 04/13/2018   HCT 42.7 04/13/2018   MCV 91.6 04/13/2018   PLT 280 04/13/2018   Lab Results  Component Value Date   NA 142 04/13/2018   K 3.9 04/13/2018   CL 106 04/13/2018   CO2 28 04/13/2018   Lab Results  Component Value Date   ALT 16 04/13/2018   AST 15 04/13/2018   ALKPHOS 106 04/13/2018   BILITOT 0.5 04/13/2018      RADIOGRAPHY:  US Breast Ltd Uni Left Inc Axilla  Result Date: 04/01/2018 CLINICAL DATA:  58 year old female with palpable subareolar left breast lump, nipple retraction and discharge for approximately 1 week. EXAM: DIGITAL DIAGNOSTIC BILATERAL MAMMOGRAM WITH CAD AND TOMO ULTRASOUND LEFT BREAST COMPARISON:  Previous exam(s). ACR Breast Density Category c: The breast tissue is heterogeneously dense, which may obscure small masses. FINDINGS: An irregular hyperdense mass is seen in the left subareolar region. An additional oval, circumscribed mass is seen in the inferior central aspect at posterior depth. No additional suspicious findings are identified within the remainder of the left breast. No suspicious mammographic findings are noted on the right. Mammographic images were processed with CAD. On physical exam, there is a 2-3 cm firm, fixed lump in the subareolar region on the left. Subtle skin changes and retraction is noted along the left nipple. Targeted ultrasound is performed, showing an irregular hypoechoic mass with associated vascularity at the 12 o'clock retroareolar position on the left. Overall measurements are 2.6 x 2.3 x 1.2 cm. This corresponds with the mammographic finding. Two adjacent circumscribed hypoechoic masses are identified in the deep 6 o'clock position 2 cm from the nipple. They measure 8 x 6 x 4 mm and 1.3 x 1.1 x 0.7 cm. There is no seated vascularity. This corresponds with the additional mammographic finding and likely represents minimally complicated cyst. Evaluation of the left axilla demonstrates a markedly enlarged abnormal lymph node with complete hilar replacement. It measures up to 5 cm in long axis dimension. IMPRESSION: 1. Highly suspicious retroareolar left breast mass. 2. Indeterminate masses at the 6 o'clock position in the left breast, possibly representing complicated cysts. 3. Suspicious left axillary lymphadenopathy. 4. No mammographic evidence of malignancy on the right.  RECOMMENDATION: Recommend ultrasound-guided biopsies of the suspicious subareolar left breast mass and left axillary lymphadenopathy. Ultrasound-guided aspiration of the 2 additional masses at the 6 o'clock position on the left is recommended. If these areas do not aspirate to completion, additional ultrasound-guided biopsy is recommended. I have discussed the findings and recommendations with the patient. Results were also provided in writing at the conclusion of the visit. If applicable, a reminder letter will be sent to the patient regarding the next appointment. BI-RADS CATEGORY  5: Highly suggestive of malignancy. Electronically Signed   By: Kristopher Oppenheim M.D.   On: 04/01/2018 13:01   Mm Diag Breast Tomo Bilateral  Result Date: 04/01/2018 CLINICAL DATA:  58 year old female with palpable subareolar left breast lump, nipple retraction and discharge for approximately 1 week. EXAM: DIGITAL DIAGNOSTIC BILATERAL MAMMOGRAM WITH CAD AND TOMO ULTRASOUND LEFT BREAST COMPARISON:  Previous exam(s). ACR Breast Density Category c: The breast tissue is heterogeneously dense, which may obscure small masses. FINDINGS: An irregular hyperdense mass is seen in the left subareolar region. An additional oval, circumscribed mass is seen in the inferior central  aspect at posterior depth. No additional suspicious findings are identified within the remainder of the left breast. No suspicious mammographic findings are noted on the right. Mammographic images were processed with CAD. On physical exam, there is a 2-3 cm firm, fixed lump in the subareolar region on the left. Subtle skin changes and retraction is noted along the left nipple. Targeted ultrasound is performed, showing an irregular hypoechoic mass with associated vascularity at the 12 o'clock retroareolar position on the left. Overall measurements are 2.6 x 2.3 x 1.2 cm. This corresponds with the mammographic finding. Two adjacent circumscribed hypoechoic masses are  identified in the deep 6 o'clock position 2 cm from the nipple. They measure 8 x 6 x 4 mm and 1.3 x 1.1 x 0.7 cm. There is no seated vascularity. This corresponds with the additional mammographic finding and likely represents minimally complicated cyst. Evaluation of the left axilla demonstrates a markedly enlarged abnormal lymph node with complete hilar replacement. It measures up to 5 cm in long axis dimension. IMPRESSION: 1. Highly suspicious retroareolar left breast mass. 2. Indeterminate masses at the 6 o'clock position in the left breast, possibly representing complicated cysts. 3. Suspicious left axillary lymphadenopathy. 4. No mammographic evidence of malignancy on the right. RECOMMENDATION: Recommend ultrasound-guided biopsies of the suspicious subareolar left breast mass and left axillary lymphadenopathy. Ultrasound-guided aspiration of the 2 additional masses at the 6 o'clock position on the left is recommended. If these areas do not aspirate to completion, additional ultrasound-guided biopsy is recommended. I have discussed the findings and recommendations with the patient. Results were also provided in writing at the conclusion of the visit. If applicable, a reminder letter will be sent to the patient regarding the next appointment. BI-RADS CATEGORY  5: Highly suggestive of malignancy. Electronically Signed   By: Kristopher Oppenheim M.D.   On: 04/01/2018 13:01   [place] Axillary Node Core Biopsy Left  Addendum Date: 04/08/2018   ADDENDUM REPORT: 04/08/2018 07:15 ADDENDUM: Pathology revealed GRADE III INVASIVE DUCTAL CARCINOMA, LYMPHOVASCULAR INVASION IS IDENTIFIED of the Left breast, 12 o'clock, retroareolar mass. METASTATIC CARCINOMA of the Left axillary lymph node. This was found to be concordant by Dr. Lajean Manes. Pathology results were discussed with the patient and her husband by telephone. The patient reported doing well after the biopsies with tenderness at the sites. Post biopsy instructions and  care were reviewed and questions were answered. The patient was encouraged to call [facility] for any additional concerns. The patient was referred to [facility] on April 13, 2018. Pathology results reported by Terie Purser, RN on 04/08/2018. Electronically Signed   By: Lajean Manes M.D.   On: 04/08/2018 07:15   Result Date: 04/08/2018 CLINICAL DATA:  Patient presents for ultrasound-guided core needle biopsy a palpable retroareolar breast mass well as an abnormal left axillary lymph node/mass. EXAM: ULTRASOUND GUIDED LEFT BREAST CORE NEEDLE BIOPSY ULTRASOUND GUIDED LEFT AXILLA CORE NEEDLE BIOPSY COMPARISON:  Previous exam(s). FINDINGS: I met with the patient and we discussed the procedure of ultrasound-guided biopsy, including benefits and alternatives. We discussed the high likelihood of a successful procedure. We discussed the risks of the procedure, including infection, bleeding, tissue injury, clip migration, and inadequate sampling. Informed written consent was given. The usual time-out protocol was performed immediately prior to the procedure. Lesion #1 quadrant: 12 o'clock, retroareolar, slightly outer quadrant. Using sterile technique and 1% Lidocaine as local anesthetic, under direct ultrasound visualization, a  12 gauge spring-loaded device was used to perform biopsy of the retroareolar, 12 o'clock position left breast mass using a medial approach. At the conclusion of the procedure a ribbon shaped tissue marker clip was deployed into the biopsy cavity. Lesion #2: Inferior left axilla. Using sterile technique and 1% Lidocaine as local anesthetic, under direct ultrasound visualization, a 14 gauge spring-loaded device was used to perform biopsy of left axillary mass/enlarged abnormal lymph node using an inferior approach. At the conclusion of the procedure a HydroMARK tissue marker clip was deployed  into the biopsy cavity. Follow up 2 view mammogram was performed and dictated separately. IMPRESSION: Ultrasound guided biopsy of a 12 o'clock position retroareolar left breast mass and a left axillary mass/enlarged abnormal lymph node. No apparent complications. Electronically Signed: By: Lajean Manes M.D. On: 04/06/2018 14:37   Mm Clip Placement Left  Result Date: 04/06/2018 CLINICAL DATA:  Status post ultrasound-guided core needle biopsy of a retroareolar, 12 o'clock position, left breast mass and a left axillary mass/enlarged abnormal lymph node. EXAM: DIAGNOSTIC LEFT MAMMOGRAM POST ULTRASOUND BIOPSY COMPARISON:  Previous exam(s). FINDINGS: Mammographic images were obtained following ultrasound guided biopsy of a retroareolar, 12 o'clock position left breast mass as well as left axillary mass/enlarged abnormal lymph node. The ribbon shaped biopsy clip lies within the retroareolar mass. The HydroMARK clip and the axillary mass could not be visualized mammographically. The clip was well seen within the left axillary mass/enlarged lymph node on ultrasound. IMPRESSION: Well-positioned ribbon shaped biopsy clip within the retroareolar left breast mass. Final Assessment: Post Procedure Mammograms for Marker Placement Electronically Signed   By: Lajean Manes M.D.   On: 04/06/2018 14:51   [place] Lt Breast Bx W Loc Dev 1st Lesion Img Bx Spec US Guide  Addendum Date: 04/08/2018   ADDENDUM REPORT: 04/08/2018 07:15 ADDENDUM: Pathology revealed GRADE III INVASIVE DUCTAL CARCINOMA, LYMPHOVASCULAR INVASION IS IDENTIFIED of the Left breast, 12 o'clock, retroareolar mass. METASTATIC CARCINOMA of the Left axillary lymph node. This was found to be concordant by Dr. Lajean Manes. Pathology results were discussed with the patient and her husband by telephone. The patient reported doing well after the biopsies with tenderness at the sites. Post biopsy instructions and care were reviewed and questions were answered. The patient  was encouraged to call [facility] for any additional concerns. The patient was referred to [facility] on April 13, 2018. Pathology results reported by Terie Purser, RN on 04/08/2018. Electronically Signed   By: Lajean Manes M.D.   On: 04/08/2018 07:15   Result Date: 04/08/2018 CLINICAL DATA:  Patient presents for ultrasound-guided core needle biopsy a palpable retroareolar breast mass well as an abnormal left axillary lymph node/mass. EXAM: ULTRASOUND GUIDED LEFT BREAST CORE NEEDLE BIOPSY ULTRASOUND GUIDED LEFT AXILLA CORE NEEDLE BIOPSY COMPARISON:  Previous exam(s). FINDINGS: I met with the patient and we discussed the procedure of ultrasound-guided biopsy, including benefits and alternatives. We discussed the high likelihood of a successful procedure. We discussed the risks of the procedure, including infection, bleeding, tissue injury, clip migration, and inadequate sampling. Informed written consent was given. The usual time-out protocol was performed immediately prior to the procedure. Lesion #1 quadrant: 12 o'clock, retroareolar, slightly outer quadrant. Using sterile technique and 1% Lidocaine as local anesthetic, under direct ultrasound visualization, a 12 gauge spring-loaded device was used to perform biopsy of the retroareolar, 12 o'clock position left breast mass using a medial approach. At the  conclusion of the procedure a ribbon shaped tissue marker clip was deployed into the biopsy cavity. Lesion #2: Inferior left axilla. Using sterile technique and 1% Lidocaine as local anesthetic, under direct ultrasound visualization, a 14 gauge spring-loaded device was used to perform biopsy of left axillary mass/enlarged abnormal lymph node using an inferior approach. At the conclusion of the procedure a HydroMARK tissue marker clip was deployed into the biopsy cavity. Follow up 2 view mammogram was  performed and dictated separately. IMPRESSION: Ultrasound guided biopsy of a 12 o'clock position retroareolar left breast mass and a left axillary mass/enlarged abnormal lymph node. No apparent complications. Electronically Signed: By: Lajean Manes M.D. On: 04/06/2018 14:37   US Breast Aspiration Left  Result Date: 04/06/2018 CLINICAL DATA:  Patient has a retroareolar left breast mass and an abnormal left axillary lymph node/mass, which were biopsied. She also has a benign appearing hypo to anechoic left breast mass measuring just over 1 cm with a smaller directly adjacent mass. These were felt likely to be cystic and aspiration was therefore performed. EXAM: ULTRASOUND GUIDED LEFT BREAST CYST ASPIRATION COMPARISON:  Previous exams. PROCEDURE: Using sterile technique, 1% lidocaine, under direct ultrasound visualization, needle aspiration of the 6 o'clock position, 2 cm from the nipple, mass hand its directly adjacent smaller mass was performed. Both of the masses fully decompressed on aspiration. There is no residual mass. Yellowish fluid was recovered, 1-2 mL. IMPRESSION: Ultrasound-guided aspiration of 2 adjacent, apparently communicating, 6 o'clock position left breast cysts. No apparent complications. RECOMMENDATIONS: 1. No follow-up of the 6 o'clock position cysts indicated. 2. Followup recommendations will be according to the results of the biopsy from the retroareolar left breast mass and the left axillary mass/enlarged lymph node. Electronically Signed   By: Lajean Manes M.D.   On: 04/06/2018 14:40       IMPRESSION/PLAN: 1. Stage IIIB, cT2N1M0 grade 3 triple negative invasive ductal carcinoma of the left breast. Dr. Lisbeth Renshaw discusses the pathology findings and reviews the nature of triple negative left breast disease. The consensus from the breast conference includes proceeding with neoadjuvant chemotherapy followed by lumpectomy and targeted axillary dissection. She would benefit from external  radiotherapy to the left breast and regional lymph nodes to reduce the risk of local recurrence. We discussed the risks, benefits, short, and long term effects of radiotherapy, and the patient is interested in proceeding at the appropriate interval. Dr. Lisbeth Renshaw discusses the delivery and logistics of radiotherapy and anticipates a course of 6 1/2 weeks of radiotherapy with deep inspiration breath hold technique. We will see her back about 2 weeks after surgery to discuss the simulation process and anticipate we starting radiotherapy about 4-6 weeks after surgery.   The above documentation reflects my direct findings during this shared patient visit. Please see the separate note by Dr. Lisbeth Renshaw on this date for the remainder of the patient's plan of care.    Carola Rhine, PAC

## 2018-04-13 NOTE — Progress Notes (Signed)
Clinical Social Work [place] Psychosocial Distress Screening [place]  Patient completed distress screening protocol and scored a 4 on the Psychosocial Distress Thermometer which indicates mild distress. Clinical Social Worker met with patient and patients family in [facility] to assess for distress and other psychosocial needs. Patient stated she was feeling overwhelmed but felt "better" after meeting with the treatment team and getting more information on her treatment plan. CSW and patient discussed common feeling and emotions when being diagnosed with cancer, and the importance of support during treatment. CSW informed patient of the support team and support services at [facility]. CSW provided contact information and encouraged patient to call with any questions or concerns.  ONCBCN DISTRESS SCREENING 04/13/2018  Screening Type Initial Screening  Distress experienced in past week (1-10) 4  Emotional problem type Nervousness/Anxiety  Information Concerns Type Lack of info about treatment     Johnnye Lana, MSW, LCSW, OSW-C Clinical Social Worker [place] [phone]

## 2018-04-13 NOTE — H&P (View-Only) (Signed)
Kathryn [name] Documented: 04/13/2018 7:24 AM Location: [facility] Patient #: [id number] DOB: [birth year] Undefined / Language: Kathryn [name] / Race: White Female  History of Present Illness Kathryn [name] A. [name] MD; 04/13/2018 11:05 AM) Patient words: Pt sent at the request of Dr Jimmye Norman for 1 week history of left breast mass and nipple dischage. No significant pain and more sore since biopsy.  path Triple negative    No family history of breast cancer.  The patient is a 58 year old female.   Past Surgical History [name], RN; 04/13/2018 7:24 AM) Breast Biopsy Left. Cesarean Section - Multiple  Diagnostic Studies History [name], RN; 04/13/2018 7:24 AM) Colonoscopy never Mammogram within last year Pap Smear 1-5 years ago  Medication History [name], RN; 04/13/2018 7:24 AM) Medications Reconciled  Social History [name], RN; 04/13/2018 7:24 AM) Alcohol use Occasional alcohol use. Caffeine use Carbonated beverages, Coffee, Tea. No drug use Tobacco use Former smoker.  Family History [name], RN; 04/13/2018 7:24 AM) Alcohol Abuse Father. Arthritis Mother. Breast Cancer Sister. Diabetes Mellitus Father. Heart Disease Father. Kidney Disease Sister. Melanoma Sister. Respiratory Condition Father.  Pregnancy / Birth History [name], RN; 04/13/2018 7:24 AM) Age at menarche 57 years. Age of menopause 51-55 Contraceptive History Oral contraceptives. Gravida 3 Irregular periods Maternal age 78-35  Other Problems [name], RN; 04/13/2018 7:24 AM) Anxiety Disorder Chest pain Migraine Headache     Review of Systems [name] RN; 04/13/2018 7:24 AM) General Not Present- Appetite Loss, Chills, Fatigue, Fever, Night Sweats, Weight Gain and Weight Loss. Skin Not Present- Change in Wart/Mole, Dryness, Hives, Jaundice, New Lesions, Non-Healing Wounds, Rash and Ulcer. HEENT Not Present-  Earache, Hearing Loss, Hoarseness, Nose Bleed, Oral Ulcers, Ringing in the Ears, Seasonal Allergies, Sinus Pain, Sore Throat, Visual Disturbances, Wears glasses/contact lenses and Yellow Eyes. Respiratory Not Present- Bloody sputum, Chronic Cough, Difficulty Breathing, Snoring and Wheezing. Breast Present- Skin Changes. Not Present- Breast Mass, Breast Pain and Nipple Discharge. Cardiovascular Not Present- Chest Pain, Difficulty Breathing Lying Down, Leg Cramps, Palpitations, Rapid Heart Rate, Shortness of Breath and Swelling of Extremities. Gastrointestinal Present- Chronic diarrhea. Not Present- Abdominal Pain, Bloating, Bloody Stool, Change in Bowel Habits, Constipation, Difficulty Swallowing, Excessive gas, Gets full quickly at meals, Hemorrhoids, Indigestion, Nausea, Rectal Pain and Vomiting. Female Genitourinary Not Present- Frequency, Nocturia, Painful Urination, Pelvic Pain and Urgency. Musculoskeletal Not Present- Back Pain, Joint Pain, Joint Stiffness, Muscle Pain, Muscle Weakness and Swelling of Extremities. Neurological Not Present- Decreased Memory, Fainting, Headaches, Numbness, Seizures, Tingling, Tremor, Trouble walking and Weakness. Psychiatric Not Present- Anxiety, Bipolar, Change in Sleep Pattern, Depression, Fearful and Frequent crying. Endocrine Present- Hot flashes. Not Present- Cold Intolerance, Excessive Hunger, Hair Changes, Heat Intolerance and New Diabetes.   Physical Exam ([name] A. [name] MD; 04/13/2018 11:07 AM)  General Mental Status-Alert. General Appearance-Consistent with stated age. Hydration-Well hydrated. Voice-Normal.  Head and Neck Head-normocephalic, atraumatic with no lesions or palpable masses. Trachea-midline. Thyroid Gland Characteristics - normal size and consistency.  Eye Eyeball - Bilateral-Extraocular movements intact. Sclera/Conjunctiva - Bilateral-No scleral icterus.  Chest and Lung Exam Chest and lung exam reveals  -quiet, even and easy respiratory effort with no use of accessory muscles and on auscultation, normal breath sounds, no adventitious sounds and normal vocal resonance. Inspection Chest Wall - Normal. Back - normal.  Breast Note: mass left retroareolar 4 cm nipple involvement right breast no mass  Cardiovascular Cardiovascular examination reveals -normal heart sounds, regular rate and rhythm with no murmurs and normal pedal  pulses bilaterally.  Neurologic Neurologic evaluation reveals -alert and oriented x 3 with no impairment of recent or remote memory. Mental Status-Normal.  Musculoskeletal Normal Exam - Left-Upper Extremity Strength Normal and Lower Extremity Strength Normal. Normal Exam - Right-Upper Extremity Strength Normal and Lower Extremity Strength Normal.  Lymphatic Head & Neck  General Head & Neck Lymphatics: Bilateral - Description - Normal. Axillary  General Axillary Region: Bilateral - Description - Normal. Tenderness - Non Tender.    Assessment & Plan ([name] A. Chany Woolworth MD; 04/13/2018 11:08 AM)  BREAST CANCER, LEFT (C50.912) Impression: neoadjuvant chemotherapy   port placement  Hopefully breast conservation to follow  Pt requires port placement for chemotherapy. Risk include bleeding, infection, pneumothorax, hemothorax, mediastinal injury, nerve injury , blood vessel injury, strke, blood clots, death, migration. embolization and need for additional procedures. Pt agrees to proceed.  Current Plans You are being scheduled for surgery- Our schedulers will call you.  You should hear from our office's scheduling department within 5 working days about the location, date, and time of surgery. We try to make accommodations for patient's preferences in scheduling surgery, but sometimes the OR schedule or the surgeon's schedule prevents [place] from making those accommodations.  If you have not heard from our office [phone]) in 5 working days, call  the office and ask for your surgeon's nurse.  If you have other questions about your diagnosis, plan, or surgery, call the office and ask for your surgeon's nurse.  Pt Education - [facility] Breast Cancer Information Given - [facility] "Breast Journey" Package Use of a central venous catheter for intravenous therapy was discussed. Technique of catheter placement using ultrasound and fluoroscopy guidance was discussed. Risks such as bleeding, infection, pneumothorax, catheter occlusion, reoperation, and other risks were discussed. I noted a good likelihood this will help address the problem. Questions were answered. The patient expressed understanding & wishes to proceed.

## 2018-04-13 NOTE — Research (Signed)
[facility] [id number], UNDERSTANDING AND PREDICTING BREAST CANCER EVENTS AFTER TREATMENT (UPBEAT STUDY).  Dr. Jana Hakim referred patient for the Upbeat study. Met with patient, her husband and mother in law in exam room for 10 minutes.  Introduced myself and briefly explained the purpose of this study and reviewed the study procedures and corresponding time points for completing.Informed patient thatall baseline activities including cardiac MRI, questionnaires, neurocognitive tests and physical functions tests will need to be completed prior to starting on treatment. Informed patient that participation is voluntary. Gave patient a study consent and hippa form along with study brochure for her review. Patient stated she was interested and will read the materials this weekend. Patient agreed it is ok for this research nurse to review her chart for eligibility and to contact her again on Monday to follow up on her interest and see if she has any questions.  Gave patient a clinical trials informational brochure and my business card. Encouraged her to call if any questions or concerns prior to next week. Thanked patient for her time todayand willingness to consider this study. Foye Spurling, BSN, RN Clinical Research Nurse 04/13/2018 11:05 AM

## 2018-04-13 NOTE — Progress Notes (Signed)
START ON PATHWAY REGIMEN - Breast     A cycle is every 14 days (cycles 1-4):     Doxorubicin      Cyclophosphamide      Pegfilgrastim-xxxx    A cycle is every 21 days (cycles 5-8):     Paclitaxel      Carboplatin   **Always confirm dose/schedule in your pharmacy ordering system**  Patient Characteristics: Preoperative or Nonsurgical Candidate (Clinical Staging), Neoadjuvant Therapy followed by Surgery, Invasive Disease, Chemotherapy, HER2 Negative/Unknown/Equivocal, ER Negative/Unknown, Platinum Therapy Indicated Therapeutic Status: Preoperative or Nonsurgical Candidate (Clinical Staging) AJCC M Category: cM0 AJCC Grade: G3 Breast Surgical Plan: Neoadjuvant Therapy followed by Surgery ER Status: Negative (-) AJCC 8 Stage Grouping: IIIB HER2 Status: Negative (-) AJCC T Category: cT2 AJCC N Category: cN1 PR Status: Negative (-) Type of Therapy: Platinum Therapy Indicated Intent of Therapy: Curative Intent, Discussed with Patient 

## 2018-04-19 ENCOUNTER — Other Ambulatory Visit: Payer: Self-pay | Admitting: Oncology

## 2018-04-19 ENCOUNTER — Encounter (HOSPITAL_COMMUNITY): Payer: Self-pay

## 2018-04-19 ENCOUNTER — Ambulatory Visit (HOSPITAL_COMMUNITY)
Admission: RE | Admit: 2018-04-19 | Discharge: 2018-04-19 | Disposition: A | Discharge status: Home / Self Care | Payer: BLUE CROSS/BLUE SHIELD | Source: Ambulatory Visit | Attending: Oncology | Admitting: Oncology

## 2018-04-19 DIAGNOSIS — Z171 Estrogen receptor negative status [ER-]: Secondary | ICD-10-CM | POA: Diagnosis present

## 2018-04-19 DIAGNOSIS — C50412 Malignant neoplasm of upper-outer quadrant of left female breast: Secondary | ICD-10-CM | POA: Diagnosis present

## 2018-04-19 MED ORDER — IOHEXOL 300 MG/ML  SOLN
75.0000 mL | Freq: Once | INTRAMUSCULAR | Status: AC | PRN
  Administered 2018-04-19: 75 mL via INTRAVENOUS

## 2018-04-19 MED ORDER — SODIUM CHLORIDE (PF) 0.9 % IJ SOLN
INTRAMUSCULAR | Status: AC
  Filled 2018-04-19: qty 50

## 2018-04-19 MED ORDER — GADOBUTROL 1 MMOL/ML IV SOLN
7.0000 mL | Freq: Once | INTRAVENOUS | Status: AC | PRN
  Administered 2018-04-19: 7 mL via INTRAVENOUS

## 2018-04-20 ENCOUNTER — Other Ambulatory Visit: Payer: Self-pay | Admitting: Oncology

## 2018-04-20 NOTE — Pre-Procedure Instructions (Signed)
Kathryn [name]  04/20/2018      [address], [place], [place] [address] Phone: [phone] Fax: [phone]    Your procedure is scheduled on Thursday March 5th.  Report to [facility] Admitting at 12:00 PM.  Call this number if you have problems the morning of surgery:  [phone]   Remember:  Do not eat after midnight. You may drink clear liquids until 11:00 AM. Clear liquids include water, non-citrus juices without pulp,  carbonated beverages, clear tea, black coffee and gatorade.     Take these medicines the morning of surgery with A SIP OF WATER NONE  7 days prior to surgery STOP taking any Aspirin(unless otherwise instructed by your surgeon), Aleve, Naproxen, Ibuprofen, Motrin, Advil, Goody's, BC's, all herbal medications, fish oil, and all vitamins     Do not wear jewelry, make-up or nail polish.  Do not wear lotions, powders, or perfumes, or deodorant.  Do not shave 48 hours prior to surgery.  Men may shave face and neck.  Do not bring valuables to the hospital.  [facility] is not responsible for any belongings or valuables.  Contacts, dentures or bridgework may not be worn into surgery.  Leave your suitcase in the car.  After surgery it may be brought to your room.  For patients admitted to the hospital, discharge time will be determined by your treatment team.  Patients discharged the day of surgery will not be allowed to drive home.   [facility]- Preparing For Surgery  Before surgery, you can play an important role. Because skin is not sterile, your skin needs to be as free of germs as possible. You can reduce the number of germs on your skin by washing with CHG (chlorahexidine gluconate) Soap before surgery.  CHG is an antiseptic cleaner which kills germs and bonds with the skin to continue killing germs even after washing.    Oral Hygiene is also important to reduce your risk of infection.   Remember - BRUSH YOUR TEETH THE MORNING OF SURGERY WITH YOUR REGULAR TOOTHPASTE  Please do not use if you have an allergy to CHG or antibacterial soaps. If your skin becomes reddened/irritated stop using the CHG.  Do not shave (including legs and underarms) for at least 48 hours prior to first CHG shower. It is OK to shave your face.  Please follow these instructions carefully.   1. Shower the NIGHT BEFORE SURGERY and the MORNING OF SURGERY with CHG.   2. If you chose to wash your hair, wash your hair first as usual with your normal shampoo.  3. After you shampoo, rinse your hair and body thoroughly to remove the shampoo.  4. Use CHG as you would any other liquid soap. You can apply CHG directly to the skin and wash gently with a scrungie or a clean washcloth.   5. Apply the CHG Soap to your body ONLY FROM THE NECK DOWN.  Do not use on open wounds or open sores. Avoid contact with your eyes, ears, mouth and genitals (private parts). Wash Face and genitals (private parts)  with your normal soap.  6. Wash thoroughly, paying special attention to the area where your surgery will be performed.  7. Thoroughly rinse your body with warm water from the neck down.  8. DO NOT shower/wash with your normal soap after using and rinsing off the CHG Soap.  9. Pat yourself dry with a  CLEAN TOWEL.  10. Wear CLEAN PAJAMAS to bed the night before surgery, wear comfortable clothes the morning of surgery  11. Place CLEAN SHEETS on your bed the night of your first shower and DO NOT SLEEP WITH PETS.    Day of Surgery: Shower as stated above. Do not apply any deodorants/lotions.  Please wear clean clothes to the hospital/surgery center.   Remember to brush your teeth WITH YOUR REGULAR TOOTHPASTE.   Please read over the following fact sheets that you were given.

## 2018-04-21 ENCOUNTER — Telehealth: Payer: Self-pay | Admitting: *Deleted

## 2018-04-21 ENCOUNTER — Encounter (HOSPITAL_COMMUNITY): Payer: Self-pay

## 2018-04-21 ENCOUNTER — Encounter (HOSPITAL_COMMUNITY)
Admission: RE | Admit: 2018-04-21 | Discharge: 2018-04-21 | Disposition: A | Discharge status: Home / Self Care | Payer: BLUE CROSS/BLUE SHIELD | Source: Ambulatory Visit | Attending: Surgery | Admitting: Surgery

## 2018-04-21 ENCOUNTER — Other Ambulatory Visit: Payer: Self-pay

## 2018-04-21 DIAGNOSIS — Z01812 Encounter for preprocedural laboratory examination: Secondary | ICD-10-CM | POA: Insufficient documentation

## 2018-04-21 HISTORY — DX: Malignant neoplasm of unspecified site of unspecified female breast: C50.919

## 2018-04-21 LAB — CBC WITH DIFFERENTIAL/PLATELET
Abs Immature Granulocytes: 0.01 10*3/uL (ref 0.00–0.07)
Basophils Absolute: 0.1 10*3/uL (ref 0.0–0.1)
Basophils Relative: 1 %
Eosinophils Absolute: 0.1 10*3/uL (ref 0.0–0.5)
Eosinophils Relative: 1 %
HCT: 42.1 % (ref 36.0–46.0)
Hemoglobin: 13.2 g/dL (ref 12.0–15.0)
IMMATURE GRANULOCYTES: 0 %
Lymphocytes Relative: 33 %
Lymphs Abs: 1.8 10*3/uL (ref 0.7–4.0)
MCH: 28.8 pg (ref 26.0–34.0)
MCHC: 31.4 g/dL (ref 30.0–36.0)
MCV: 91.7 fL (ref 80.0–100.0)
Monocytes Absolute: 0.4 10*3/uL (ref 0.1–1.0)
Monocytes Relative: 7 %
Neutro Abs: 3.2 10*3/uL (ref 1.7–7.7)
Neutrophils Relative %: 58 %
Platelets: 288 10*3/uL (ref 150–400)
RBC: 4.59 MIL/uL (ref 3.87–5.11)
RDW: 12.9 % (ref 11.5–15.5)
WBC: 5.6 10*3/uL (ref 4.0–10.5)
nRBC: 0 % (ref 0.0–0.2)

## 2018-04-21 LAB — COMPREHENSIVE METABOLIC PANEL
ALT: 32 U/L (ref 0–44)
AST: 27 U/L (ref 15–41)
Albumin: 3.9 g/dL (ref 3.5–5.0)
Alkaline Phosphatase: 87 U/L (ref 38–126)
Anion gap: 6 (ref 5–15)
BUN: 10 mg/dL (ref 6–20)
CO2: 25 mmol/L (ref 22–32)
Calcium: 9 mg/dL (ref 8.9–10.3)
Chloride: 106 mmol/L (ref 98–111)
Creatinine, Ser: 0.68 mg/dL (ref 0.44–1.00)
GFR calc Af Amer: >60 mL/min (ref >60)
GFR calc non Af Amer: >60 mL/min (ref >60)
Glucose, Bld: 95 mg/dL (ref 70–99)
Potassium: 3.9 mmol/L (ref 3.5–5.1)
Sodium: 137 mmol/L (ref 135–145)
Total Bilirubin: 0.9 mg/dL (ref 0.3–1.2)
Total Protein: 7.4 g/dL (ref 6.5–8.1)

## 2018-04-21 NOTE — Telephone Encounter (Signed)
  Oncology Nurse Navigator Documentation  Navigator Location: [facility] (04/21/18 1300)   )Navigator Encounter Type: Telephone;[facility] Follow-up (04/21/18 1300) Telephone: Outgoing Call;Clinic/[facility] Follow-up (04/21/18 1300)       Genetic Counseling Date: 05/02/18 (04/21/18 1300) Genetic Counseling Type: Non-Urgent (04/21/18 1300)                                        Time Spent with Patient: 15 (04/21/18 1300)

## 2018-04-21 NOTE — Telephone Encounter (Signed)
LVM for patient to follow up on her interest in the [place] Upbeat Study.   Requested patient return call to research nurse if she is interested or has any questions.   Thanked patient for her time. Foye Spurling, BSN, RN Clinical Research Nurse 04/21/2018 9:32 AM

## 2018-04-21 NOTE — Progress Notes (Signed)
PCP - Kandace Blitz Cardiologist - saw one time [facility]- b/c her father had cardiac issues - no follow up  Chest x-ray - not needed EKG - not needed Stress Test - 05/27/15 ECHO - 05/27/15 Cardiac Cath - denies  Anesthesia review: NO  Patient denies shortness of breath, fever, cough and chest pain at PAT appointment   Patient verbalized understanding of instructions that were given to them at the PAT appointment. Patient was also instructed that they will need to review over the PAT instructions again at home before surgery.

## 2018-04-21 NOTE — Telephone Encounter (Signed)
Spoke with patient to reschedule her appointment with Dr. Jana Hakim from 3/5 to 3/4 due to her port is being placed on 3/5.  Patient is aware.

## 2018-04-22 ENCOUNTER — Encounter: Payer: Self-pay | Admitting: *Deleted

## 2018-04-22 DIAGNOSIS — C50412 Malignant neoplasm of upper-outer quadrant of left female breast: Secondary | ICD-10-CM

## 2018-04-22 DIAGNOSIS — Z171 Estrogen receptor negative status [ER-]: Principal | ICD-10-CM

## 2018-04-25 ENCOUNTER — Ambulatory Visit (HOSPITAL_COMMUNITY)
Admission: RE | Admit: 2018-04-25 | Discharge: 2018-04-25 | Disposition: A | Discharge status: Home / Self Care | Payer: BLUE CROSS/BLUE SHIELD | Source: Ambulatory Visit | Attending: Oncology | Admitting: Oncology

## 2018-04-25 ENCOUNTER — Encounter (HOSPITAL_COMMUNITY)
Admission: RE | Admit: 2018-04-25 | Discharge: 2018-04-25 | Disposition: A | Discharge status: Home / Self Care | Payer: BLUE CROSS/BLUE SHIELD | Source: Ambulatory Visit | Attending: Oncology | Admitting: Oncology

## 2018-04-25 ENCOUNTER — Encounter: Payer: Self-pay | Admitting: Oncology

## 2018-04-25 ENCOUNTER — Ambulatory Visit (HOSPITAL_BASED_OUTPATIENT_CLINIC_OR_DEPARTMENT_OTHER)
Admission: RE | Admit: 2018-04-25 | Discharge: 2018-04-25 | Disposition: A | Discharge status: Home / Self Care | Payer: BLUE CROSS/BLUE SHIELD | Source: Ambulatory Visit | Attending: Oncology | Admitting: Oncology

## 2018-04-25 ENCOUNTER — Inpatient Hospital Stay: Payer: BLUE CROSS/BLUE SHIELD | Attending: Oncology

## 2018-04-25 DIAGNOSIS — R42 Dizziness and giddiness: Secondary | ICD-10-CM | POA: Insufficient documentation

## 2018-04-25 DIAGNOSIS — C50412 Malignant neoplasm of upper-outer quadrant of left female breast: Secondary | ICD-10-CM | POA: Insufficient documentation

## 2018-04-25 DIAGNOSIS — Z7689 Persons encountering health services in other specified circumstances: Secondary | ICD-10-CM | POA: Insufficient documentation

## 2018-04-25 DIAGNOSIS — C773 Secondary and unspecified malignant neoplasm of axilla and upper limb lymph nodes: Secondary | ICD-10-CM | POA: Insufficient documentation

## 2018-04-25 DIAGNOSIS — Z171 Estrogen receptor negative status [ER-]: Secondary | ICD-10-CM | POA: Insufficient documentation

## 2018-04-25 DIAGNOSIS — K5900 Constipation, unspecified: Secondary | ICD-10-CM | POA: Insufficient documentation

## 2018-04-25 DIAGNOSIS — R112 Nausea with vomiting, unspecified: Secondary | ICD-10-CM | POA: Insufficient documentation

## 2018-04-25 DIAGNOSIS — I700 Atherosclerosis of aorta: Secondary | ICD-10-CM | POA: Insufficient documentation

## 2018-04-25 DIAGNOSIS — Z801 Family history of malignant neoplasm of trachea, bronchus and lung: Secondary | ICD-10-CM | POA: Insufficient documentation

## 2018-04-25 DIAGNOSIS — Z5111 Encounter for antineoplastic chemotherapy: Secondary | ICD-10-CM | POA: Insufficient documentation

## 2018-04-25 DIAGNOSIS — D709 Neutropenia, unspecified: Secondary | ICD-10-CM | POA: Insufficient documentation

## 2018-04-25 DIAGNOSIS — I959 Hypotension, unspecified: Secondary | ICD-10-CM | POA: Insufficient documentation

## 2018-04-25 DIAGNOSIS — Z79899 Other long term (current) drug therapy: Secondary | ICD-10-CM | POA: Insufficient documentation

## 2018-04-25 DIAGNOSIS — Z8051 Family history of malignant neoplasm of kidney: Secondary | ICD-10-CM | POA: Insufficient documentation

## 2018-04-25 DIAGNOSIS — R05 Cough: Secondary | ICD-10-CM | POA: Insufficient documentation

## 2018-04-25 DIAGNOSIS — J3489 Other specified disorders of nose and nasal sinuses: Secondary | ICD-10-CM | POA: Insufficient documentation

## 2018-04-25 MED ORDER — TECHNETIUM TC 99M MEDRONATE IV KIT
20.0000 | PACK | Freq: Once | INTRAVENOUS | Status: AC | PRN
  Administered 2018-04-25: 20 via INTRAVENOUS

## 2018-04-25 NOTE — Research (Signed)
Called patient to follow up on her interest in the Upbeat study and to see if she has any questions.  Patient states she does not feel she would have enough time to participate in this study due to family events.  Patient said she will call research nurse back if she changes her mind.  Informed patient that consent visit and all baseline study activities including cardiac MRI would need to be done prior to starting on chemotherapy.  Patient verbalized understanding and at this time states she needs to decline due to time constraints. Thanked patient very much for her time and consideration of this study.  Foye Spurling, BSN, Cabin crew

## 2018-04-25 NOTE — Progress Notes (Signed)
Met w/ pt tointroduce myself as her Arboriculturist and to discuss copay assistance.  Pt gave me consent to apply in her behalf so I enrolled her in the [place] Completeprogramfor [place] $15,000 for 12 monthsfrom 04/25/18.Pt will pay $0 for her first treatment and $0 for each subsequent treatment (up to $7,200 per treatment).  I also informed her of the [employer], went over what it covers and gave her the income requirement.  She would like to apply so she will bring proof of income on 04/27/18.  If approved I will give her an expense sheet.  She has my card for anyquestions or concernsshe may havein the future.

## 2018-04-25 NOTE — Progress Notes (Signed)
  Echocardiogram 2D Echocardiogram has been performed.  Darlina Sicilian M 04/25/2018, 11:50 AM

## 2018-04-25 NOTE — Research (Signed)
DCP-001 Use of a Clinical Trial Screening Tool to Address Cancer Health Disparities in the [place] Program [facility]) Informed patient of the DCP study which involves one time collection of demographic and medical data from patients who were screened on NCI trials.  Informed patient it is voluntary and asked if research nurse can give her more information during her second chemotherapy treatment.  If patient is interested then it would take about 20 minute of her time to consent and answer study questions during her treatment.  Patient agreed for research nurse to meet with her in infusion room during her second treatment.   Thanked patient for her time. Foye Spurling, BSN, Cabin crew

## 2018-04-26 ENCOUNTER — Other Ambulatory Visit: Payer: Self-pay | Admitting: Oncology

## 2018-04-26 NOTE — Progress Notes (Signed)
[facility]  [phone] [phone]    ID: Kathryn [name] DOB: [birth year]  MR#: [id number]  WCB#:[id number]  Patient Care Team: Kandace Blitz, MD as PCP - General (Obstetrics and Gynecology) Rockwell Germany, RN as Oncology Nurse Navigator Mauro Kaufmann, RN as Oncology Nurse Navigator Erroll Luna, MD as Consulting Physician (General Surgery) , Virgie Dad, MD as Consulting Physician (Oncology) Kyung Rudd, MD as Consulting Physician (Radiation Oncology) OTHER MD: Rana Snare (OBGYN)   CHIEF COMPLAINT: Triple negative breast cancer  CURRENT TREATMENT: Neoadjuvant chemotherapy   HISTORY OF CURRENT ILLNESS: From the original intake note:  Kathryn [name] presented with a palpable subareolar left breast lump, nipple retraction, and discharge for approximately 1 week. She underwent bilateral diagnostic mammography with tomography and left breast ultrasonography at [facility] on 04/01/2018 showing: Breast Density Category C. An irregular hyperdense mass is seen in the left subareolar region. An additional oval, circumscribed mass is seen in the inferior central aspect at posterior depth. No additional suspicious findings are identified within the remainder of the left breast. On physical exam, there is a 2-3 cm firm, fixed lump in the subareolar region on the left. Subtle skin changes and retraction is noted along the left nipple. Targeted ultrasound is performed, showing an irregular hypoechoic mass with associated vascularity at the 12 o'clock retroareolar position on the left. Overall measurements are 2.6 x 2.3 x 1.2 cm. This corresponds with the mammographic finding. Two adjacent circumscribed hypoechoic masses are identified in the deep 6 o'clock position 2 cm from the nipple. They measure 0.8 x 0.6 x 0.4 cm and 1.3 x 1.1 x 0.7 cm. There is no seated vascularity. This corresponds with the additional mammographic finding and likely represents  minimally complicated cyst. Evaluation of the left axilla demonstrates a markedly enlarged abnormal lymph node with complete hilar replacement. It measures up to 5 cm in long axis dimension. No suspicious mammographic findings on the right.   Accordingly on 04/06/2018 she proceeded to biopsy of the left breast area in question. The pathology from this procedure showed ([id number]): invasive ductal carcinoma, grade III, with lymphovascular invasion. Prognostic indicators significant for: estrogen receptor, 0% negative and progesterone receptor, 0% negative. Proliferation marker Ki67 at 70%. HER2 equivocal (2+) by immunohistochemistry, but negative by FISH (print3ed report pending).  On the same day she underwent a biopsy of the left axillary lymph node on 04/06/2018 showing ([id number]): metastatic carcinoma in 1 of 1 lymph node (1/1).   The patient's subsequent history is as detailed below.   INTERVAL HISTORY: Tashiba returns today for follow-up and treatment of her triple negative breast cancer. She is accompanied by her husband.  Since her last visit here, she underwent a chest CT with contrast on 03/30/2018 showing Large left breast periareolar mass with nipple involvement is identified compatible with primary breast carcinoma. A smaller, separate nodule is identified slightly more laterally which may represent an additional site of disease. Large left axillary lymph nodes compatible with metastatic adenopathy. No additional sites of disease identified within the chest. No mediastinal or hilar adenopathy or suspicious pulmonary nodules. No evidence for osseous metastasis.  She then underwent a bilateral breast MRI with and without contrast on 04/19/2018 showing Breast Density Category C. 5.0 centimeter mass involving the LEFT nipple in central portion of the LEFT breast. Numerous small surrounding satellite lesions are suspicious for malignancy. Enlarged LEFT axillary level 1 lymph node is 5  centimeters and previously biopsied. Enlarged LEFT axillary level 2 lymph  node is 6 millimeters, not Biopsied. Four 5-6 millimeter LEFT internal mammary lymph nodes are suspicious in light of the distribution. RIGHT breast, axilla, and internal mammary chain are negative.  In addition, she underwent an echocardiogram on 04/25/2018, showing an ejection fraction in the 60% - 65% range.   Finally, she underwent a whole body bone scan on 04/25/2018 showing Uptake at the shoulders, elbows, wrists, hips, knees, and feet, typically degenerative. Questionable abnormal increased osseous tracer accumulation at the RIGHT lateral fifth rib, anterior LEFT fifth rib, and anterior LEFT seventh rib. These could represent subtle sites of osseous metastases though the sites at the fifth ribs could be related to overlying breast localization of tracer. No definite abnormalities are seen at these sites on CT. No additional sites of abnormal osseous tracer accumulation are Identified. Expected urinary tract and soft tissue distribution of tracer.   REVIEW OF SYSTEMS: Aliyanna had questions about how change is measured to determine if chemotherapy is working. She also was curious about the duration of her treatment and if her regimen would change if she was having excellent results. Both her and her husband had a lot of questions regarding medications that were ok to take while she was receiving chemotherapy. She was also curious about the Dignicap.  She notes that she has a light cough and rhinorrhea, which she says is likely due to allergies. The patient denies unusual headaches, visual changes, nausea, vomiting, or dizziness. There has been no unusual cough, phlegm production, or pleurisy. This been no change in bowel or bladder habits. The patient denies unexplained fatigue or unexplained weight loss, bleeding, rash, or fever. A detailed review of systems was otherwise noncontributory.    PAST MEDICAL HISTORY: Past Medical  History:  Diagnosis Date  . Breast cancer ([place])    stage 3 - left     PAST SURGICAL HISTORY: Past Surgical History:  Procedure Laterality Date  . CESAREAN SECTION     x3     FAMILY HISTORY: Family History  Problem Relation Age of Onset  . Kidney cancer Sister    Samanatha's father died from congestive heart failure at age 8. Patients' mother is 80 as of 03/2018. The patient has 1 brother and 3 sisters. Patient denies anyone in her family having breast, ovarian, prostate, or pancreatic cancer. Jarah's sister, [name], was diagnosed with Kidney Cancer at 62. Katrine has an uncle and a cousin that were diagnosed with lung cancer, but they were both heavy smokers.    GYNECOLOGIC HISTORY:  No LMP recorded. Patient is postmenopausal. 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 Her husband, Sarabella Caprio, owns [employer]. Together, they have three children, [name], [place], and [place]. Bailynn Dyk is 28, lives in [place], and works as a Theme park manager for the [employer]. Emberli Ballester is 14, lives in [place], and is a Equities trader at [employer]. Inessa Wardrop is 85, is a Ship broker, and lives at home with [name] and [name].    ADVANCED DIRECTIVES: Chloee's husband, Sakinah Rosamond, is automatically her healthcare power of attorney.    HEALTH MAINTENANCE: Social History   Tobacco Use  . Smoking status: Never Smoker  . Smokeless tobacco: Never Used  Substance Use Topics  . Alcohol use: Yes    Comment:  socially  . Drug use: Never    Colonoscopy: no  PAP: 2015  Bone density: no   No Known Allergies  Current Outpatient Medications  Medication Sig Dispense Refill  . dexamethasone (DECADRON) 4 MG tablet Take 2 tablets by  mouth once a day on the day after chemotherapy and then take 2 tablets two times a day for 2 days. Take with food. 30 tablet 1  . lidocaine-prilocaine (EMLA) cream Apply to affected area once 30 g 3  . LORazepam (ATIVAN) 0.5 MG tablet Take 1 tablet (0.5 mg total) by mouth at bedtime as needed (Nausea or vomiting). 30 tablet 0  . prochlorperazine (COMPAZINE) 10 MG tablet Take 1 tablet (10 mg total) by mouth every 6 (six) hours as needed (Nausea or vomiting). 30 tablet 1   No current facility-administered medications for this visit.      OBJECTIVE: Middle-aged white woman who appears stated age  80:   04/27/18 0937  BP: 130/78  Pulse: 86  Resp: 18  Temp: 98 F (36.7 C)  SpO2: 100%     Body mass index is 27.05 kg/m.   Wt Readings from Last 3 Encounters:  04/27/18 158 lb 12.8 oz (72 kg)  04/21/18 158 lb 12.8 oz (72 kg)  04/13/18 160 lb 1.6 oz (72.6 kg)      ECOG FS:0 - Asymptomatic  Sclerae unicteric, EOMs intact Oropharynx clear and moist No cervical or supraclavicular adenopathy Lungs no rales or rhonchi Heart regular rate and rhythm Abd soft, nontender, positive bowel sounds MSK no focal spinal tenderness, no upper extremity lymphedema Neuro: nonfocal, well oriented, appropriate affect Breasts: The right breast is benign.  The left breast shows significant induration particularly inferiorly and laterally, without frank erythema or peau d'orange.  The nipple is markedly abnormal and is imaged below.  There is a fixed hard palpable mass in the left axilla.  Left breast 04/13/2018      LAB RESULTS:  CMP     Component Value Date/Time   NA 137 04/21/2018 1415   K 3.9 04/21/2018 1415   CL 106 04/21/2018 1415   CO2 25 04/21/2018 1415   GLUCOSE 95 04/21/2018 1415   BUN 10 04/21/2018 1415   CREATININE 0.68 04/21/2018 1415   CREATININE 0.83 04/13/2018 0843   CALCIUM 9.0 04/21/2018 1415   PROT 7.4 04/21/2018 1415   ALBUMIN 3.9 04/21/2018 1415   AST 27 04/21/2018  1415   AST 15 04/13/2018 0843   ALT 32 04/21/2018 1415   ALT 16 04/13/2018 0843   ALKPHOS 87 04/21/2018 1415   BILITOT 0.9 04/21/2018 1415   BILITOT 0.5 04/13/2018 0843   GFRNONAA >60 04/21/2018 1415   GFRNONAA >60 04/13/2018 0843   GFRAA >60 04/21/2018 1415   GFRAA >60 04/13/2018 0843    No results found for: TOTALPROTELP, ALBUMINELP, A1GS, A2GS, BETS, BETA2SER, GAMS, MSPIKE, SPEI  No results found for: KPAFRELGTCHN, LAMBDASER, [facility]  Lab Results  Component Value Date   WBC 5.6 04/21/2018   NEUTROABS 3.2 04/21/2018   HGB 13.2 04/21/2018   HCT 42.1 04/21/2018   MCV 91.7 04/21/2018   PLT 288 04/21/2018    _0 @  No results found for: LABCA2  No components found for: DXAJOI786  No results for input(s): INR in the last 168 hours.  No results found for: LABCA2  No results found for: VEH209  No results found for: OBS962  No results found for: EZM629  No results found for: CA2729  No components found for: HGQUANT  No results found for: CEA1 / No results found for: CEA1   No results found for: AFPTUMOR  No results found for: CHROMOGRNA  No results found for: PSA1  No visits with results within 3 Day(s) from this visit.  Latest known visit with results is:  Hospital Outpatient Visit on 04/21/2018  Component Date Value Ref Range Status  . WBC 04/21/2018 5.6  4.0 - 10.5 K/uL Final  . RBC 04/21/2018 4.59  3.87 - 5.11 MIL/uL Final  . Hemoglobin 04/21/2018 13.2  12.0 - 15.0 g/dL Final  . HCT 04/21/2018 42.1  36.0 - 46.0 % Final  . MCV 04/21/2018 91.7  80.0 - 100.0 fL Final  . MCH 04/21/2018 28.8  26.0 - 34.0 pg Final  . MCHC 04/21/2018 31.4  30.0 - 36.0 g/dL Final  . RDW 04/21/2018 12.9  11.5 - 15.5 % Final  . Platelets 04/21/2018 288  150 - 400 K/uL Final  . nRBC 04/21/2018 0.0  0.0 - 0.2 % Final  . Neutrophils Relative % 04/21/2018 58  % Final  . Neutro Abs 04/21/2018 3.2  1.7 - 7.7 K/uL Final  . Lymphocytes Relative 04/21/2018 33  % Final    . Lymphs Abs 04/21/2018 1.8  0.7 - 4.0 K/uL Final  . Monocytes Relative 04/21/2018 7  % Final  . Monocytes Absolute 04/21/2018 0.4  0.1 - 1.0 K/uL Final  . Eosinophils Relative 04/21/2018 1  % Final  . Eosinophils Absolute 04/21/2018 0.1  0.0 - 0.5 K/uL Final  . Basophils Relative 04/21/2018 1  % Final  . Basophils Absolute 04/21/2018 0.1  0.0 - 0.1 K/uL Final  . Immature Granulocytes 04/21/2018 0  % Final  . Abs Immature Granulocytes 04/21/2018 0.01  0.00 - 0.07 K/uL Final   Performed at [facility], [place] [address] [place], [place] [address]  . Sodium 04/21/2018 137  135 - 145 mmol/L Final  . Potassium 04/21/2018 3.9  3.5 - 5.1 mmol/L Final  . Chloride 04/21/2018 106  98 - 111 mmol/L Final  . CO2 04/21/2018 25  22 - 32 mmol/L Final  . Glucose, Bld 04/21/2018 95  70 - 99 mg/dL Final  . BUN 04/21/2018 10  6 - 20 mg/dL Final  . Creatinine, Ser 04/21/2018 0.68  0.44 - 1.00 mg/dL Final  . Calcium 04/21/2018 9.0  8.9 - 10.3 mg/dL Final  . Total Protein 04/21/2018 7.4  6.5 - 8.1 g/dL Final  . Albumin 04/21/2018 3.9  3.5 - 5.0 g/dL Final  . AST 04/21/2018 27  15 - 41 U/L Final  . ALT 04/21/2018 32  0 - 44 U/L Final  . Alkaline Phosphatase 04/21/2018 87  38 - 126 U/L Final  . Total Bilirubin 04/21/2018 0.9  0.3 - 1.2 mg/dL Final  . GFR calc non Af Amer 04/21/2018 >60  >60 mL/min Final  . GFR calc Af Amer 04/21/2018 >60  >60 mL/min Final  . Anion gap 04/21/2018 6  5 - 15 Final   Performed at [facility], [place] [address], [place], [place] [address]    (this displays the last labs from the last 3 days)  No results found for: TOTALPROTELP, ALBUMINELP, A1GS, A2GS, BETS, BETA2SER, GAMS, MSPIKE, SPEI (this displays SPEP labs)  No results found for: KPAFRELGTCHN, LAMBDASER, KAPLAMBRATIO (kappa/lambda light chains)  No results found for: HGBA, HGBA2QUANT, HGBFQUANT, HGBSQUAN (Hemoglobinopathy evaluation)   No results found for: LDH  No results found for: IRON, TIBC,  IRONPCTSAT (Iron and TIBC)  No  results found for: FERRITIN  Urinalysis No results found for: COLORURINE, APPEARANCEUR, LABSPEC, PHURINE, GLUCOSEU, HGBUR, BILIRUBINUR, KETONESUR, PROTEINUR, UROBILINOGEN, NITRITE, LEUKOCYTESUR   STUDIES:  Ct Chest W Contrast  Result Date: 04/19/2018 CLINICAL DATA:  New diagnosis of left breast cancer. EXAM: CT CHEST WITH CONTRAST TECHNIQUE: Multidetector CT imaging of the chest was performed during intravenous contrast administration. CONTRAST:  74m OMNIPAQUE IOHEXOL 300 MG/ML  SOLN COMPARISON:  None FINDINGS: Cardiovascular: Heart size normal. No pericardial effusion. Aortic atherosclerosis. Mediastinum/Nodes: Large left axillary lymph nodes are identified. Index node measures 4.2 by 2.5 cm, image 47/2. No enlarged retropectoral lymph nodes. No supraclavicular adenopathy. No enlarged mediastinal, or hilar lymph nodes. Thyroid gland, trachea, and esophagus demonstrate no significant findings. Lungs/Pleura: No pleural effusion. No suspicious pulmonary nodule or mass. Upper Abdomen: No acute abnormality. Musculoskeletal: No acute or significant osseous findings. Left breast periareolar mass, involving the nipple, measures 4.9 by 2.8 by 2.6 cm, image 74/2. A separate nodule is identified more laterally measuring 1 cm, image 70/2. IMPRESSION: 1. Large left breast periareolar mass with nipple involvement is identified compatible with primary breast carcinoma. A smaller, separate nodule is identified slightly more laterally which may represent an additional site of disease. 2. Large left axillary lymph nodes compatible with metastatic adenopathy. 3. No additional sites of disease identified within the chest. No mediastinal or hilar adenopathy or suspicious pulmonary nodules. No evidence for osseous metastasis. Electronically Signed   By: TKerby MoorsM.D.   On: 04/19/2018 09:37   Nm Bone Scan Whole Body  Result Date: 04/25/2018 CLINICAL DATA:  New diagnosis of invasive  breast cancer, staging EXAM: NUCLEAR MEDICINE WHOLE BODY BONE SCAN TECHNIQUE: Whole body anterior and posterior images were obtained approximately 3 hours after intravenous injection of radiopharmaceutical. RADIOPHARMACEUTICALS:  20 mCi Technetium-[id number] IV COMPARISON:  None Correlation: CT chest 04/19/2018 FINDINGS: Uptake at the shoulders, elbows, wrists, hips, knees, and feet, typically degenerative. Questionable abnormal increased osseous tracer accumulation at the RIGHT lateral fifth rib, anterior LEFT fifth rib, and anterior LEFT seventh rib. These could represent subtle sites of osseous metastases though the sites at the fifth ribs could be related to overlying breast localization of tracer. No definite abnormalities are seen at these sites on CT. No additional sites of abnormal osseous tracer accumulation are identified. Expected urinary tract and soft tissue distribution of tracer. IMPRESSION: Unable to exclude subtle osseous metastatic lesions involving anterior BILATERAL ribs as above. Electronically Signed   By: MaLavonia Dana.D.   On: 04/25/2018 16:55   Mr Breast Bilateral W Wo Contrast Inc Cad  Result Date: 04/19/2018 CLINICAL DATA:  Recent diagnosis of LEFT breast grade 3 invasive ductal carcinoma with lymphovascular invasion in the 12 o'clock location of the LEFT breast. Biopsy of enlarged LEFT axillary lymph node shows metastatic disease. LABS:  None obtained at the time of imaging. EXAM: BILATERAL BREAST MRI WITH AND WITHOUT CONTRAST TECHNIQUE: Multiplanar, multisequence MR images of both breasts were obtained prior to and following the intravenous administration of 7 ml of Gadavist Three-dimensional MR images were rendered by post-processing of the original MR data on an independent workstation. The three-dimensional MR images were interpreted, and findings are reported in the following complete MRI report for this study. Three dimensional images were evaluated at the independent DynaCad  workstation COMPARISON:  04/06/2018 and earlier FINDINGS: Breast composition: c. Heterogeneous fibroglandular tissue. Background parenchymal enhancement: Mild Right breast: No mass or abnormal enhancement. Left breast: Large central mass measures 5.0 x 2.6 x 3.4 centimeters  and involves the LEFT nipple, expanding the nipple and obscuring normal anatomy. Mass demonstrates heterogeneous enhancement and washout type kinetics. Numerous satellite nodules are identified around the mass. The 1 centimeter nodule in the posterior aspect of the LEFT breast identified on CT represents a cyst, and does not enhance. Lymph nodes: LEFT axillary mass is 5.0 x 2.6 centimeters, consistent with metastatic lymph node recently biopsied. A 6 millimeter level 2 LEFT axillary lymph node is present. There are 4 LEFT internal mammary lymph nodes, measuring 5 millimeters, 6 millimeters, 6 millimeters, and 6 millimeters. The RIGHT internal mammary chain and axilla are negative. Ancillary findings:  None. IMPRESSION: 1. 5.0 centimeter mass involving the LEFT nipple in central portion of the LEFT breast. Numerous small surrounding satellite lesions are suspicious for malignancy. 2. Enlarged LEFT axillary level 1 lymph node is 5 centimeters and previously biopsied. Enlarged LEFT axillary level 2 lymph node is 6 millimeters, not biopsied. 3. Four 5-6 millimeter LEFT internal mammary lymph nodes are suspicious in light of the distribution. 4. RIGHT breast, axilla, and internal mammary chain are negative. RECOMMENDATION: Treatment plan for known LEFT breast cancer. BI-RADS CATEGORY  6: Known biopsy-proven malignancy. Electronically Signed   By: Nolon Nations M.D.   On: 04/19/2018 13:16   US Breast Ltd Uni Left Inc Axilla  Result Date: 04/01/2018 CLINICAL DATA:  58 year old female with palpable subareolar left breast lump, nipple retraction and discharge for approximately 1 week. EXAM: DIGITAL DIAGNOSTIC BILATERAL MAMMOGRAM WITH CAD AND TOMO  ULTRASOUND LEFT BREAST COMPARISON:  Previous exam(s). ACR Breast Density Category c: The breast tissue is heterogeneously dense, which may obscure small masses. FINDINGS: An irregular hyperdense mass is seen in the left subareolar region. An additional oval, circumscribed mass is seen in the inferior central aspect at posterior depth. No additional suspicious findings are identified within the remainder of the left breast. No suspicious mammographic findings are noted on the right. Mammographic images were processed with CAD. On physical exam, there is a 2-3 cm firm, fixed lump in the subareolar region on the left. Subtle skin changes and retraction is noted along the left nipple. Targeted ultrasound is performed, showing an irregular hypoechoic mass with associated vascularity at the 12 o'clock retroareolar position on the left. Overall measurements are 2.6 x 2.3 x 1.2 cm. This corresponds with the mammographic finding. Two adjacent circumscribed hypoechoic masses are identified in the deep 6 o'clock position 2 cm from the nipple. They measure 8 x 6 x 4 mm and 1.3 x 1.1 x 0.7 cm. There is no seated vascularity. This corresponds with the additional mammographic finding and likely represents minimally complicated cyst. Evaluation of the left axilla demonstrates a markedly enlarged abnormal lymph node with complete hilar replacement. It measures up to 5 cm in long axis dimension. IMPRESSION: 1. Highly suspicious retroareolar left breast mass. 2. Indeterminate masses at the 6 o'clock position in the left breast, possibly representing complicated cysts. 3. Suspicious left axillary lymphadenopathy. 4. No mammographic evidence of malignancy on the right. RECOMMENDATION: Recommend ultrasound-guided biopsies of the suspicious subareolar left breast mass and left axillary lymphadenopathy. Ultrasound-guided aspiration of the 2 additional masses at the 6 o'clock position on the left is recommended. If these areas do not  aspirate to completion, additional ultrasound-guided biopsy is recommended. I have discussed the findings and recommendations with the patient. Results were also provided in writing at the conclusion of the visit. If applicable, a reminder letter will be sent to the patient regarding the next appointment. BI-RADS CATEGORY  5: Highly suggestive of malignancy. Electronically Signed   By: Kristopher Oppenheim M.D.   On: 04/01/2018 13:01   Mm Diag Breast Tomo Bilateral  Result Date: 04/01/2018 CLINICAL DATA:  58 year old female with palpable subareolar left breast lump, nipple retraction and discharge for approximately 1 week. EXAM: DIGITAL DIAGNOSTIC BILATERAL MAMMOGRAM WITH CAD AND TOMO ULTRASOUND LEFT BREAST COMPARISON:  Previous exam(s). ACR Breast Density Category c: The breast tissue is heterogeneously dense, which may obscure small masses. FINDINGS: An irregular hyperdense mass is seen in the left subareolar region. An additional oval, circumscribed mass is seen in the inferior central aspect at posterior depth. No additional suspicious findings are identified within the remainder of the left breast. No suspicious mammographic findings are noted on the right. Mammographic images were processed with CAD. On physical exam, there is a 2-3 cm firm, fixed lump in the subareolar region on the left. Subtle skin changes and retraction is noted along the left nipple. Targeted ultrasound is performed, showing an irregular hypoechoic mass with associated vascularity at the 12 o'clock retroareolar position on the left. Overall measurements are 2.6 x 2.3 x 1.2 cm. This corresponds with the mammographic finding. Two adjacent circumscribed hypoechoic masses are identified in the deep 6 o'clock position 2 cm from the nipple. They measure 8 x 6 x 4 mm and 1.3 x 1.1 x 0.7 cm. There is no seated vascularity. This corresponds with the additional mammographic finding and likely represents minimally complicated cyst. Evaluation of the  left axilla demonstrates a markedly enlarged abnormal lymph node with complete hilar replacement. It measures up to 5 cm in long axis dimension. IMPRESSION: 1. Highly suspicious retroareolar left breast mass. 2. Indeterminate masses at the 6 o'clock position in the left breast, possibly representing complicated cysts. 3. Suspicious left axillary lymphadenopathy. 4. No mammographic evidence of malignancy on the right. RECOMMENDATION: Recommend ultrasound-guided biopsies of the suspicious subareolar left breast mass and left axillary lymphadenopathy. Ultrasound-guided aspiration of the 2 additional masses at the 6 o'clock position on the left is recommended. If these areas do not aspirate to completion, additional ultrasound-guided biopsy is recommended. I have discussed the findings and recommendations with the patient. Results were also provided in writing at the conclusion of the visit. If applicable, a reminder letter will be sent to the patient regarding the next appointment. BI-RADS CATEGORY  5: Highly suggestive of malignancy. Electronically Signed   By: Kristopher Oppenheim M.D.   On: 04/01/2018 13:01   [place] Axillary Node Core Biopsy Left  Addendum Date: 04/08/2018   ADDENDUM REPORT: 04/08/2018 07:15 ADDENDUM: Pathology revealed GRADE III INVASIVE DUCTAL CARCINOMA, LYMPHOVASCULAR INVASION IS IDENTIFIED of the Left breast, 12 o'clock, retroareolar mass. METASTATIC CARCINOMA of the Left axillary lymph node. This was found to be concordant by Dr. Lajean Manes. Pathology results were discussed with the patient and her husband by telephone. The patient reported doing well after the biopsies with tenderness at the sites. Post biopsy instructions and care were reviewed and questions were answered. The patient was encouraged to call [facility] for any additional concerns. The patient was referred to [facility] at [facility] on  April 13, 2018. Pathology results reported by Terie Purser, RN on 04/08/2018. Electronically Signed   By: Lajean Manes M.D.   On: 04/08/2018 07:15   Result Date: 04/08/2018 CLINICAL DATA:  Patient presents for ultrasound-guided core needle biopsy a palpable retroareolar breast mass well as an abnormal left axillary  lymph node/mass. EXAM: ULTRASOUND GUIDED LEFT BREAST CORE NEEDLE BIOPSY ULTRASOUND GUIDED LEFT AXILLA CORE NEEDLE BIOPSY COMPARISON:  Previous exam(s). FINDINGS: I met with the patient and we discussed the procedure of ultrasound-guided biopsy, including benefits and alternatives. We discussed the high likelihood of a successful procedure. We discussed the risks of the procedure, including infection, bleeding, tissue injury, clip migration, and inadequate sampling. Informed written consent was given. The usual time-out protocol was performed immediately prior to the procedure. Lesion #1 quadrant: 12 o'clock, retroareolar, slightly outer quadrant. Using sterile technique and 1% Lidocaine as local anesthetic, under direct ultrasound visualization, a 12 gauge spring-loaded device was used to perform biopsy of the retroareolar, 12 o'clock position left breast mass using a medial approach. At the conclusion of the procedure a ribbon shaped tissue marker clip was deployed into the biopsy cavity. Lesion #2: Inferior left axilla. Using sterile technique and 1% Lidocaine as local anesthetic, under direct ultrasound visualization, a 14 gauge spring-loaded device was used to perform biopsy of left axillary mass/enlarged abnormal lymph node using an inferior approach. At the conclusion of the procedure a HydroMARK tissue marker clip was deployed into the biopsy cavity. Follow up 2 view mammogram was performed and dictated separately. IMPRESSION: Ultrasound guided biopsy of a 12 o'clock position retroareolar left breast mass and a left axillary mass/enlarged abnormal lymph node. No apparent complications.  Electronically Signed: By: Lajean Manes M.D. On: 04/06/2018 14:37   Mm Clip Placement Left  Result Date: 04/06/2018 CLINICAL DATA:  Status post ultrasound-guided core needle biopsy of a retroareolar, 12 o'clock position, left breast mass and a left axillary mass/enlarged abnormal lymph node. EXAM: DIAGNOSTIC LEFT MAMMOGRAM POST ULTRASOUND BIOPSY COMPARISON:  Previous exam(s). FINDINGS: Mammographic images were obtained following ultrasound guided biopsy of a retroareolar, 12 o'clock position left breast mass as well as left axillary mass/enlarged abnormal lymph node. The ribbon shaped biopsy clip lies within the retroareolar mass. The HydroMARK clip and the axillary mass could not be visualized mammographically. The clip was well seen within the left axillary mass/enlarged lymph node on ultrasound. IMPRESSION: Well-positioned ribbon shaped biopsy clip within the retroareolar left breast mass. Final Assessment: Post Procedure Mammograms for Marker Placement Electronically Signed   By: Lajean Manes M.D.   On: 04/06/2018 14:51   [place] Lt Breast Bx W Loc Dev 1st Lesion Img Bx Spec US Guide  Addendum Date: 04/08/2018   ADDENDUM REPORT: 04/08/2018 07:15 ADDENDUM: Pathology revealed GRADE III INVASIVE DUCTAL CARCINOMA, LYMPHOVASCULAR INVASION IS IDENTIFIED of the Left breast, 12 o'clock, retroareolar mass. METASTATIC CARCINOMA of the Left axillary lymph node. This was found to be concordant by Dr. Lajean Manes. Pathology results were discussed with the patient and her husband by telephone. The patient reported doing well after the biopsies with tenderness at the sites. Post biopsy instructions and care were reviewed and questions were answered. The patient was encouraged to call [facility] for any additional concerns. The patient was referred to [facility] at [facility] on April 13, 2018. Pathology results reported by  Terie Purser, RN on 04/08/2018. Electronically Signed   By: Lajean Manes M.D.   On: 04/08/2018 07:15   Result Date: 04/08/2018 CLINICAL DATA:  Patient presents for ultrasound-guided core needle biopsy a palpable retroareolar breast mass well as an abnormal left axillary lymph node/mass. EXAM: ULTRASOUND GUIDED LEFT BREAST CORE NEEDLE BIOPSY ULTRASOUND GUIDED LEFT AXILLA CORE NEEDLE BIOPSY COMPARISON:  Previous exam(s). FINDINGS: I met  with the patient and we discussed the procedure of ultrasound-guided biopsy, including benefits and alternatives. We discussed the high likelihood of a successful procedure. We discussed the risks of the procedure, including infection, bleeding, tissue injury, clip migration, and inadequate sampling. Informed written consent was given. The usual time-out protocol was performed immediately prior to the procedure. Lesion #1 quadrant: 12 o'clock, retroareolar, slightly outer quadrant. Using sterile technique and 1% Lidocaine as local anesthetic, under direct ultrasound visualization, a 12 gauge spring-loaded device was used to perform biopsy of the retroareolar, 12 o'clock position left breast mass using a medial approach. At the conclusion of the procedure a ribbon shaped tissue marker clip was deployed into the biopsy cavity. Lesion #2: Inferior left axilla. Using sterile technique and 1% Lidocaine as local anesthetic, under direct ultrasound visualization, a 14 gauge spring-loaded device was used to perform biopsy of left axillary mass/enlarged abnormal lymph node using an inferior approach. At the conclusion of the procedure a HydroMARK tissue marker clip was deployed into the biopsy cavity. Follow up 2 view mammogram was performed and dictated separately. IMPRESSION: Ultrasound guided biopsy of a 12 o'clock position retroareolar left breast mass and a left axillary mass/enlarged abnormal lymph node. No apparent complications. Electronically Signed: By: Lajean Manes M.D. On:  04/06/2018 14:37   US Breast Aspiration Left  Result Date: 04/06/2018 CLINICAL DATA:  Patient has a retroareolar left breast mass and an abnormal left axillary lymph node/mass, which were biopsied. She also has a benign appearing hypo to anechoic left breast mass measuring just over 1 cm with a smaller directly adjacent mass. These were felt likely to be cystic and aspiration was therefore performed. EXAM: ULTRASOUND GUIDED LEFT BREAST CYST ASPIRATION COMPARISON:  Previous exams. PROCEDURE: Using sterile technique, 1% lidocaine, under direct ultrasound visualization, needle aspiration of the 6 o'clock position, 2 cm from the nipple, mass hand its directly adjacent smaller mass was performed. Both of the masses fully decompressed on aspiration. There is no residual mass. Yellowish fluid was recovered, 1-2 mL. IMPRESSION: Ultrasound-guided aspiration of 2 adjacent, apparently communicating, 6 o'clock position left breast cysts. No apparent complications. RECOMMENDATIONS: 1. No follow-up of the 6 o'clock position cysts indicated. 2. Followup recommendations will be according to the results of the biopsy from the retroareolar left breast mass and the left axillary mass/enlarged lymph node. Electronically Signed   By: Lajean Manes M.D.   On: 04/06/2018 14:40     ELIGIBLE FOR AVAILABLE RESEARCH PROTOCOL: [id number]   ASSESSMENT: 58 y.o. [place], [place] woman status post left breast upper outer quadrant biopsy 04/06/2018 for a clinical T2 N2, stage IIIC invasive ductal carcinoma, grade 3, triple negative, with an MIB-1 of 70%  (a) breast MRI 04/19/2018 shows a 5 cm central breast lesion with multiple satellite nodules and greater then 3 abnormal axillary lymph nodes  (b) baseline echocardiogram 04/25/2018 shows an ejection fraction in the 60-65% range  (c) chest CT scan and bone scan showed no metastatic disease.  Nonspecific rib changes will need to be followed  (1) neoadjuvant chemotherapy will consist of  doxorubicin and cyclophosphamide in dose dense fashion x4, starting 05/03/2018, to be followed by weekly paclitaxel and carboplatin x12  (2) definitive surgery to follow  (3) adjuvant radiation after surgery  (4) genetics testing 04/27/2018   PLAN: Racquel has been doing quite a bit of praying to get herself ready to deal with her breast cancer and she is very prepared to start chemo next week.  We reviewed her echocardiogram results,  her CT scan results, and her bone scan results.  She understands that there are some stable abnormalities which are very subtle over some of her ribs and that these will need to be followed.  She tells me she has broken several ribs in the past and possibly we are seeing the residual of this.  Brought me a copy of her healthcare power of attorney which I am separately scanning.  Her husband is her healthcare power of attorney agent.  They are getting some advice from friends including the use of liquid silver to prevent breast cancer and other nonstandard interventions.  They are appropriately wary of proceeding with any of them and I think the best strategy for her is to try to take only the medications we prescribed while she is receiving chemotherapy to make sure there are no untoward interactions.  She has met with our chemotherapy teaching nurse and today we reviewed in detail how to take her supportive medications  We also discussed the [employer] And if she decides to use it she will let us know.  At this point she is not motivated.  She will meet with genetics today.  She will have a port placed this week.  She will have her first cycle of chemotherapy 05/03/2018 and see my nurse practitioner at that time.  She will see me a week later to review side effects and I have asked her to keep a detailed diary of side effects from the day of treatment on  They know to call for any other issues that may develop before her return visit.  , Virgie Dad, MD  04/27/18  1:16 PM Medical Oncology and Hematology [facility] [address] [place], [place] [address] Tel. [phone]    Fax. [phone]   I, Jacqualyn Posey am acting as a Education administrator for Chauncey Cruel, MD.   I, Lurline Del MD, have reviewed the above documentation for accuracy and completeness, and I agree with the above.

## 2018-04-27 ENCOUNTER — Encounter: Payer: Self-pay | Admitting: Oncology

## 2018-04-27 ENCOUNTER — Inpatient Hospital Stay: Payer: BLUE CROSS/BLUE SHIELD

## 2018-04-27 ENCOUNTER — Inpatient Hospital Stay (HOSPITAL_BASED_OUTPATIENT_CLINIC_OR_DEPARTMENT_OTHER): Payer: BLUE CROSS/BLUE SHIELD | Admitting: Oncology

## 2018-04-27 ENCOUNTER — Encounter: Payer: Self-pay | Admitting: Genetic Counselor

## 2018-04-27 ENCOUNTER — Inpatient Hospital Stay (HOSPITAL_BASED_OUTPATIENT_CLINIC_OR_DEPARTMENT_OTHER): Payer: BLUE CROSS/BLUE SHIELD | Admitting: Genetic Counselor

## 2018-04-27 VITALS — BP 130/78 | HR 86 | Temp 98.0°F | Resp 18 | Ht 64.25 in | Wt 158.8 lb

## 2018-04-27 DIAGNOSIS — Z7689 Persons encountering health services in other specified circumstances: Secondary | ICD-10-CM | POA: Diagnosis not present

## 2018-04-27 DIAGNOSIS — C773 Secondary and unspecified malignant neoplasm of axilla and upper limb lymph nodes: Secondary | ICD-10-CM | POA: Diagnosis not present

## 2018-04-27 DIAGNOSIS — Z801 Family history of malignant neoplasm of trachea, bronchus and lung: Secondary | ICD-10-CM

## 2018-04-27 DIAGNOSIS — Z808 Family history of malignant neoplasm of other organs or systems: Secondary | ICD-10-CM

## 2018-04-27 DIAGNOSIS — Z171 Estrogen receptor negative status [ER-]: Secondary | ICD-10-CM

## 2018-04-27 DIAGNOSIS — C50412 Malignant neoplasm of upper-outer quadrant of left female breast: Secondary | ICD-10-CM

## 2018-04-27 DIAGNOSIS — R112 Nausea with vomiting, unspecified: Secondary | ICD-10-CM | POA: Diagnosis not present

## 2018-04-27 DIAGNOSIS — Z79899 Other long term (current) drug therapy: Secondary | ICD-10-CM

## 2018-04-27 DIAGNOSIS — K5900 Constipation, unspecified: Secondary | ICD-10-CM | POA: Diagnosis not present

## 2018-04-27 DIAGNOSIS — Z8051 Family history of malignant neoplasm of kidney: Secondary | ICD-10-CM | POA: Diagnosis not present

## 2018-04-27 DIAGNOSIS — R42 Dizziness and giddiness: Secondary | ICD-10-CM | POA: Diagnosis not present

## 2018-04-27 DIAGNOSIS — I700 Atherosclerosis of aorta: Secondary | ICD-10-CM | POA: Diagnosis not present

## 2018-04-27 DIAGNOSIS — R05 Cough: Secondary | ICD-10-CM

## 2018-04-27 DIAGNOSIS — Z5111 Encounter for antineoplastic chemotherapy: Secondary | ICD-10-CM

## 2018-04-27 DIAGNOSIS — J3489 Other specified disorders of nose and nasal sinuses: Secondary | ICD-10-CM

## 2018-04-27 DIAGNOSIS — D709 Neutropenia, unspecified: Secondary | ICD-10-CM | POA: Diagnosis not present

## 2018-04-27 DIAGNOSIS — I959 Hypotension, unspecified: Secondary | ICD-10-CM | POA: Diagnosis not present

## 2018-04-27 DIAGNOSIS — Z7183 Encounter for nonprocreative genetic counseling: Secondary | ICD-10-CM

## 2018-04-27 LAB — CBC WITH DIFFERENTIAL/PLATELET
Abs Immature Granulocytes: 0.01 10*3/uL (ref 0.00–0.07)
Basophils Absolute: 0 10*3/uL (ref 0.0–0.1)
Basophils Relative: 1 %
Eosinophils Absolute: 0.1 10*3/uL (ref 0.0–0.5)
Eosinophils Relative: 1 %
HCT: 40.7 % (ref 36.0–46.0)
Hemoglobin: 12.9 g/dL (ref 12.0–15.0)
Immature Granulocytes: 0 %
Lymphocytes Relative: 27 %
Lymphs Abs: 1.6 10*3/uL (ref 0.7–4.0)
MCH: 29.2 pg (ref 26.0–34.0)
MCHC: 31.7 g/dL (ref 30.0–36.0)
MCV: 92.1 fL (ref 80.0–100.0)
MONO ABS: 0.4 10*3/uL (ref 0.1–1.0)
Monocytes Relative: 7 %
Neutro Abs: 3.8 10*3/uL (ref 1.7–7.7)
Neutrophils Relative %: 64 %
PLATELETS: 290 10*3/uL (ref 150–400)
RBC: 4.42 MIL/uL (ref 3.87–5.11)
RDW: 13.1 % (ref 11.5–15.5)
WBC: 5.8 10*3/uL (ref 4.0–10.5)
nRBC: 0 % (ref 0.0–0.2)

## 2018-04-27 LAB — COMPREHENSIVE METABOLIC PANEL
ALBUMIN: 4.2 g/dL (ref 3.5–5.0)
ALT: 30 U/L (ref 0–44)
AST: 25 U/L (ref 15–41)
Alkaline Phosphatase: 106 U/L (ref 38–126)
Anion gap: 7 (ref 5–15)
BUN: 11 mg/dL (ref 6–20)
CO2: 25 mmol/L (ref 22–32)
Calcium: 8.6 mg/dL — ABNORMAL LOW (ref 8.9–10.3)
Chloride: 107 mmol/L (ref 98–111)
Creatinine, Ser: 0.74 mg/dL (ref 0.44–1.00)
GFR calc Af Amer: >60 mL/min (ref >60)
GFR calc non Af Amer: >60 mL/min (ref >60)
GLUCOSE: 101 mg/dL — AB (ref 70–99)
Potassium: 4 mmol/L (ref 3.5–5.1)
Sodium: 139 mmol/L (ref 135–145)
TOTAL PROTEIN: 7.6 g/dL (ref 6.5–8.1)
Total Bilirubin: 0.5 mg/dL (ref 0.3–1.2)

## 2018-04-27 MED ORDER — LORAZEPAM 0.5 MG PO TABS: 0.5000 mg | ORAL_TABLET | Freq: Every evening | ORAL | 0 refills | Status: DC | PRN

## 2018-04-27 MED ORDER — DEXAMETHASONE 4 MG PO TABS: ORAL_TABLET | ORAL | 1 refills | Status: DC

## 2018-04-27 MED ORDER — PROCHLORPERAZINE MALEATE 10 MG PO TABS: 10.0000 mg | ORAL_TABLET | Freq: Four times a day (QID) | ORAL | 1 refills | Status: DC | PRN

## 2018-04-27 MED ORDER — LIDOCAINE-PRILOCAINE 2.5-2.5 % EX CREA: TOPICAL_CREAM | CUTANEOUS | 3 refills | Status: DC

## 2018-04-27 NOTE — Progress Notes (Signed)
Pt brought her proof of income to apply for the [employer] but unfortunately she is over the income requirement.  If anything changes with her income she can apply at that time if she's still in active treatment.

## 2018-04-27 NOTE — Progress Notes (Signed)
REFERRING PROVIDER: Chauncey Cruel, MD [address] [place], [place] [address]  PRIMARY PROVIDER:  Kandace Blitz, MD  PRIMARY REASON FOR VISIT:  1. Malignant neoplasm of upper-outer quadrant of left breast in female, estrogen receptor negative ([place])   2. Family history of melanoma   3. Family history of kidney cancer      HISTORY OF PRESENT ILLNESS:   Kathryn [name], a 58 y.o. female, was seen for a [facility] cancer genetics consultation at the request of Dr. Jana Hakim due to a personal and family history of cancer.  Kathryn [name] presents to clinic today to discuss the possibility of a hereditary predisposition to cancer, genetic testing, and to further clarify her future cancer risks, as well as potential cancer risks for family members.   In February 2020, at the age of 72, Kathryn [name] was diagnosed with triple negative invasive ductal carcinoma of the left breast. This will be treated with chemotherapy, surgery and radiation.      CANCER HISTORY:    Malignant neoplasm of upper-outer quadrant of left breast in female, estrogen receptor negative ([place])   04/11/2018 Initial Diagnosis    Malignant neoplasm of upper-outer quadrant of left breast in female, estrogen receptor negative ([place])    05/03/2018 -  Chemotherapy    The patient had DOXOrubicin (ADRIAMYCIN) chemo injection 108 mg, 60 mg/m2 = 108 mg, Intravenous,  Once, 0 of 4 cycles palonosetron (ALOXI) injection 0.25 mg, 0.25 mg, Intravenous,  Once, 0 of 8 cycles pegfilgrastim-cbqv (UDENYCA) injection 6 mg, 6 mg, Subcutaneous, Once, 0 of 4 cycles CARBOplatin (PARAPLATIN) in sodium chloride 0.9 % 100 mL chemo infusion, , Intravenous,  Once, 0 of 4 cycles cyclophosphamide (CYTOXAN) 1,080 mg in sodium chloride 0.9 % 250 mL chemo infusion, 600 mg/m2 = 1,080 mg, Intravenous,  Once, 0 of 4 cycles PACLitaxel (TAXOL) 144 mg in sodium chloride 0.9 % 250 mL chemo infusion (</= 40m/m2), 80 mg/m2, Intravenous,  Once, 0 of 4  cycles fosaprepitant (EMEND) 150 mg, dexamethasone (DECADRON) 12 mg in sodium chloride 0.9 % 145 mL IVPB, , Intravenous,  Once, 0 of 8 cycles  for chemotherapy treatment.       HORMONAL RISK FACTORS:  Menarche was at age 58  First live birth at age 58  OCP use for approximately 3 years.  Ovaries intact: yes.  Hysterectomy: no.  Menopausal status: postmenopausal.  HRT use: 0 years. Colonoscopy: no; not examined. Mammogram within the last year: yes. Number of breast biopsies: 1. Up to date with pelvic exams:  no. Any excessive radiation exposure in the past:  no  Past Medical History:  Diagnosis Date  . [place]    stage 11 - left  . Family history of kidney cancer   . Family history of melanoma     Past Surgical History:  Procedure Laterality Date  . CESAREAN SECTION     x3    Social History   Socioeconomic History  . Marital status: Married    Spouse name: Not on file  . Number of children: Not on file  . Years of education: Not on file  . Highest education level: Not on file  Occupational History  . Not on file  Social Needs  . Financial resource strain: Not on file  . Food insecurity:    Worry: Not on file    Inability: Not on file  . Transportation needs:    Medical: Not on file    Non-medical: Not on file  Tobacco  Use  . Smoking status: Never Smoker  . Smokeless tobacco: Never Used  Substance and Sexual Activity  . Alcohol use: Yes    Comment: socially  . Drug use: Never  . Sexual activity: Not on file  Lifestyle  . Physical activity:    Days per week: Not on file    Minutes per session: Not on file  . Stress: Not on file  Relationships  . Social connections:    Talks on phone: Not on file    Gets together: Not on file    Attends religious service: Not on file    Active member of club or organization: Not on file    Attends meetings of clubs or organizations: Not on file    Relationship status: Not on file  Other Topics Concern   . Not on file  Social History Narrative  . Not on file     FAMILY HISTORY:  We obtained a detailed, 4-generation family history.  Significant diagnoses are listed below: Family History  Problem Relation Age of Onset  . Kidney cancer Sister 9       d. 54  . Lung cancer Paternal Uncle   . Melanoma Sister   . Melanoma Sister     The patient has three daughters who are cancer free.  She has three sisters and a brother.  One sister had kidney cancer at 87 and died at 87, and the two other sisters have had melanoma.  Her father is deceased and her mother is living.  The patient's mother is alive at 2.  She is an only child.  Both of her parents died from non cancer related issues.  The patient's father is deceased at 40.  He had a sister and two brothers.  One brother had lung cancer, as did his daughter - both were smokers.  The paternal grandparents are deceased from non cancer related issues.  Kathryn [name] is unaware of previous family history of genetic testing for hereditary cancer risks. Patient's maternal ancestors are of Caucasian descent, and paternal ancestors are of Caucasian descent. There is no reported Ashkenazi Jewish ancestry. There is no known consanguinity.  GENETIC COUNSELING ASSESSMENT: Kathryn [name] is a 58 y.o. female with a personal and family history of cancer which is somewhat suggestive of a hereditary cancer syndrome and predisposition to cancer. We, therefore, discussed and recommended the following at today's visit.   DISCUSSION: We discussed that about 5-10% of breast cancer is hereditary with most cases due to BRCA mutation.  There are other genes associated with hereditary breast cancer, including PALB2 with an increased risk for triple negative cancer, as well as ATM and CHEK2.  We discussed that about 5-10% of melanoma is hereditary.  One gene called MITF has a specific mutation that can increase the risk for both melanoma and kidney cancer.    We reviewed  the characteristics, features and inheritance patterns of hereditary cancer syndromes. We also discussed genetic testing, including the appropriate family members to test, the process of testing, insurance coverage and turn-around-time for results. We discussed the implications of a negative, positive and/or variant of uncertain significant result. We recommended [name] pursue genetic testing for the multi-cancer gene panel. The Multi-Gene Panel offered by [facility] includes sequencing and/or deletion duplication testing of the following 84 genes: AIP, ALK, APC, ATM, AXIN2,BAP1,  BARD1, BLM, BMPR1A, BRCA1, BRCA2, BRIP1, CASR, CDC73, CDH1, CDK4, CDKN1B, CDKN1C, CDKN2A (p14ARF), CDKN2A (p16INK4a), CEBPA, CHEK2, CTNNA1, DICER1, DIS3L2, EGFR (c.2369C>T, p.Thr790Met  variant only), EPCAM (Deletion/duplication testing only), FH, FLCN, GATA2, GPC3, GREM1 (Promoter region deletion/duplication testing only), HOXB13 (c.251G>A, p.Gly84Glu), HRAS, KIT, MAX, MEN1, MET, MITF (c.952G>A, p.Glu318Lys variant only), MLH1, MSH2, MSH3, MSH6, MUTYH, NBN, NF1, NF2, NTHL1, PALB2, PDGFRA, PHOX2B, PMS2, POLD1, POLE, POT1, PRKAR1A, PTCH1, PTEN, RAD50, RAD51C, RAD51D, RB1, RECQL4, RET, RUNX1, SDHAF2, SDHA (sequence changes only), SDHB, SDHC, SDHD, SMAD4, SMARCA4, SMARCB1, SMARCE1, STK11, SUFU, TERC, TERT, TMEM127, TP53, TSC1, TSC2, VHL, WRN and WT1.    Based on Kathryn [name]'s personal and family history of cancer, she meets medical criteria for genetic testing. Despite that she meets criteria, she may still have an out of pocket cost. We discussed that if her out of pocket cost for testing is over $100, the laboratory will call and confirm whether she wants to proceed with testing.  If the out of pocket cost of testing is less than $100 she will be billed by the genetic testing laboratory.   PLAN: After considering the risks, benefits, and limitations, Kathryn [name]  provided informed consent to pursue genetic testing and the blood sample was  sent to [facility] for analysis of the Multi-cancer gene panel. Results should be available within approximately 2-3 weeks' time, at which point they will be disclosed by telephone to [name], as will any additional recommendations warranted by these results. Kathryn [name] will receive a summary of her genetic counseling visit and a copy of her results once available. This information will also be available in Epic. We encouraged Kathryn [name] to remain in contact with cancer genetics annually so that we can continuously update the family history and inform her of any changes in cancer genetics and testing that may be of benefit for her family. Kathryn [name] questions were answered to her satisfaction today. Our contact information was provided should additional questions or concerns arise.  Lastly, we encouraged Kathryn [name] to remain in contact with cancer genetics annually so that we can continuously update the family history and inform her of any changes in cancer genetics and testing that may be of benefit for this family.   Ms.  [name] questions were answered to her satisfaction today. Our contact information was provided should additional questions or concerns arise. Thank you for the referral and allowing [place] to share in the care of your patient.   Karen P. Florene Glen, [place], [facility] Certified Genetic Counselor [email or web address] phone: [phone]  The patient was seen for a total of 45 minutes in face-to-face genetic counseling.  This patient was discussed with Drs. Magrinat, Lindi Adie and/or Burr Medico who agrees with the above.    _______________________________________________________________________ For Office Staff:  Number of people involved in session: 2 Was an Intern/ student involved with case: yes: [employer]

## 2018-04-28 ENCOUNTER — Ambulatory Visit (HOSPITAL_COMMUNITY): Payer: BLUE CROSS/BLUE SHIELD | Admitting: Certified Registered Nurse Anesthetist

## 2018-04-28 ENCOUNTER — Ambulatory Visit: Payer: BLUE CROSS/BLUE SHIELD | Admitting: Oncology

## 2018-04-28 ENCOUNTER — Ambulatory Visit (HOSPITAL_COMMUNITY)
Admission: RE | Admit: 2018-04-28 | Discharge: 2018-04-28 | Disposition: A | Discharge status: Home / Self Care | Payer: BLUE CROSS/BLUE SHIELD | Attending: Surgery | Admitting: Surgery

## 2018-04-28 ENCOUNTER — Ambulatory Visit (HOSPITAL_COMMUNITY): Payer: BLUE CROSS/BLUE SHIELD

## 2018-04-28 ENCOUNTER — Encounter (HOSPITAL_COMMUNITY): Payer: Self-pay

## 2018-04-28 ENCOUNTER — Encounter (HOSPITAL_COMMUNITY)
Admission: RE | Disposition: A | Discharge status: Home / Self Care | Payer: Self-pay | Source: Home / Self Care | Attending: Surgery

## 2018-04-28 DIAGNOSIS — Z87891 Personal history of nicotine dependence: Secondary | ICD-10-CM | POA: Insufficient documentation

## 2018-04-28 DIAGNOSIS — C50919 Malignant neoplasm of unspecified site of unspecified female breast: Secondary | ICD-10-CM

## 2018-04-28 DIAGNOSIS — G43909 Migraine, unspecified, not intractable, without status migrainosus: Secondary | ICD-10-CM | POA: Insufficient documentation

## 2018-04-28 DIAGNOSIS — Z79899 Other long term (current) drug therapy: Secondary | ICD-10-CM | POA: Insufficient documentation

## 2018-04-28 DIAGNOSIS — C50912 Malignant neoplasm of unspecified site of left female breast: Secondary | ICD-10-CM | POA: Diagnosis not present

## 2018-04-28 DIAGNOSIS — F419 Anxiety disorder, unspecified: Secondary | ICD-10-CM | POA: Diagnosis not present

## 2018-04-28 DIAGNOSIS — Z95828 Presence of other vascular implants and grafts: Secondary | ICD-10-CM

## 2018-04-28 HISTORY — PX: PORTACATH PLACEMENT: SHX2246

## 2018-04-28 SURGERY — INSERTION, TUNNELED CENTRAL VENOUS DEVICE, WITH PORT
Anesthesia: General | Site: Chest

## 2018-04-28 MED ORDER — FENTANYL CITRATE (PF) 250 MCG/5ML IJ SOLN
INTRAMUSCULAR | Status: AC
  Filled 2018-04-28: qty 5

## 2018-04-28 MED ORDER — SODIUM CHLORIDE 0.9 % IV SOLN
INTRAVENOUS | Status: AC
  Filled 2018-04-28: qty 1.2

## 2018-04-28 MED ORDER — OXYCODONE HCL 5 MG PO TABS: 5.0000 mg | ORAL_TABLET | Freq: Four times a day (QID) | ORAL | 0 refills | Status: DC | PRN

## 2018-04-28 MED ORDER — ONDANSETRON HCL 4 MG/2ML IJ SOLN
INTRAMUSCULAR | Status: DC | PRN
  Administered 2018-04-28: 4 mg via INTRAVENOUS

## 2018-04-28 MED ORDER — BUPIVACAINE-EPINEPHRINE 0.25% -1:200000 IJ SOLN
INTRAMUSCULAR | Status: DC | PRN
  Administered 2018-04-28: 10 mL

## 2018-04-28 MED ORDER — HEPARIN SOD (PORK) LOCK FLUSH 100 UNIT/ML IV SOLN
INTRAVENOUS | Status: DC | PRN
  Administered 2018-04-28: 500 [IU] via INTRAVENOUS

## 2018-04-28 MED ORDER — ACETAMINOPHEN 500 MG PO TABS
1000.0000 mg | ORAL_TABLET | ORAL | Status: AC
  Administered 2018-04-28: 1000 mg via ORAL
  Filled 2018-04-28: qty 2

## 2018-04-28 MED ORDER — MIDAZOLAM HCL 2 MG/2ML IJ SOLN
INTRAMUSCULAR | Status: AC
  Filled 2018-04-28: qty 2

## 2018-04-28 MED ORDER — MIDAZOLAM HCL 2 MG/2ML IJ SOLN
INTRAMUSCULAR | Status: DC | PRN
  Administered 2018-04-28: 2 mg via INTRAVENOUS

## 2018-04-28 MED ORDER — PROPOFOL 10 MG/ML IV BOLUS
INTRAVENOUS | Status: AC
  Filled 2018-04-28: qty 20

## 2018-04-28 MED ORDER — PROPOFOL 10 MG/ML IV BOLUS
INTRAVENOUS | Status: DC | PRN
  Administered 2018-04-28: 120 mg via INTRAVENOUS

## 2018-04-28 MED ORDER — DEXAMETHASONE SODIUM PHOSPHATE 10 MG/ML IJ SOLN
INTRAMUSCULAR | Status: DC | PRN
  Administered 2018-04-28: 5 mg via INTRAVENOUS

## 2018-04-28 MED ORDER — BUPIVACAINE-EPINEPHRINE (PF) 0.25% -1:200000 IJ SOLN
INTRAMUSCULAR | Status: AC
  Filled 2018-04-28: qty 30

## 2018-04-28 MED ORDER — FENTANYL CITRATE (PF) 250 MCG/5ML IJ SOLN
INTRAMUSCULAR | Status: DC | PRN
  Administered 2018-04-28: 50 ug via INTRAVENOUS

## 2018-04-28 MED ORDER — CHLORHEXIDINE GLUCONATE CLOTH 2 % EX PADS: 6.0000 | MEDICATED_PAD | Freq: Once | CUTANEOUS | Status: DC

## 2018-04-28 MED ORDER — CEFAZOLIN SODIUM-DEXTROSE 2-4 GM/100ML-% IV SOLN
2.0000 g | INTRAVENOUS | Status: AC
  Administered 2018-04-28: 2 g via INTRAVENOUS
  Filled 2018-04-28: qty 100

## 2018-04-28 MED ORDER — GABAPENTIN 300 MG PO CAPS
300.0000 mg | ORAL_CAPSULE | ORAL | Status: AC
  Administered 2018-04-28: 300 mg via ORAL
  Filled 2018-04-28: qty 1

## 2018-04-28 MED ORDER — LACTATED RINGERS IV SOLN
INTRAVENOUS | Status: DC | PRN
  Administered 2018-04-28: 12:00:00 via INTRAVENOUS

## 2018-04-28 MED ORDER — IBUPROFEN 800 MG PO TABS: 800.0000 mg | ORAL_TABLET | Freq: Three times a day (TID) | ORAL | 0 refills | Status: DC | PRN

## 2018-04-28 MED ORDER — PHENYLEPHRINE 40 MCG/ML (10ML) SYRINGE FOR IV PUSH (FOR BLOOD PRESSURE SUPPORT)
PREFILLED_SYRINGE | INTRAVENOUS | Status: DC | PRN
  Administered 2018-04-28: 80 ug via INTRAVENOUS

## 2018-04-28 MED ORDER — SODIUM CHLORIDE 0.9 % IV SOLN
INTRAVENOUS | Status: DC | PRN
  Administered 2018-04-28: 14:00:00

## 2018-04-28 MED ORDER — HEPARIN SOD (PORK) LOCK FLUSH 100 UNIT/ML IV SOLN
INTRAVENOUS | Status: AC
  Filled 2018-04-28: qty 5

## 2018-04-28 MED ORDER — 0.9 % SODIUM CHLORIDE (POUR BTL) OPTIME
TOPICAL | Status: DC | PRN
  Administered 2018-04-28: 1000 mL

## 2018-04-28 MED ORDER — LIDOCAINE 2% (20 MG/ML) 5 ML SYRINGE
INTRAMUSCULAR | Status: DC | PRN
  Administered 2018-04-28: 60 mg via INTRAVENOUS

## 2018-04-28 MED ORDER — CELECOXIB 200 MG PO CAPS
200.0000 mg | ORAL_CAPSULE | ORAL | Status: AC
  Administered 2018-04-28: 200 mg via ORAL
  Filled 2018-04-28: qty 1

## 2018-04-28 SURGICAL SUPPLY — 39 items
BAG DECANTER FOR FLEXI CONT (MISCELLANEOUS) ×3 IMPLANT
CHLORAPREP W/TINT 26ML (MISCELLANEOUS) ×3 IMPLANT
COVER SURGICAL LIGHT HANDLE (MISCELLANEOUS) ×3 IMPLANT
COVER TRANSDUCER ULTRASND GEL (DRAPE) ×3 IMPLANT
COVER WAND RF STERILE (DRAPES) ×3 IMPLANT
CRADLE DONUT ADULT HEAD (MISCELLANEOUS) ×3 IMPLANT
DERMABOND ADVANCED (GAUZE/BANDAGES/DRESSINGS) ×2
DERMABOND ADVANCED .7 DNX12 (GAUZE/BANDAGES/DRESSINGS) ×1 IMPLANT
DRAPE C-ARM 42X72 X-RAY (DRAPES) ×3 IMPLANT
ELECT CAUTERY BLADE 6.4 (BLADE) ×3 IMPLANT
ELECT REM PT RETURN 9FT ADLT (ELECTROSURGICAL) ×3
ELECTRODE REM PT RTRN 9FT ADLT (ELECTROSURGICAL) ×1 IMPLANT
GAUZE 4X4 16PLY RFD (DISPOSABLE) ×3 IMPLANT
GEL ULTRASOUND 20GR AQUASONIC (MISCELLANEOUS) IMPLANT
GLOVE BIO SURGEON STRL SZ8 (GLOVE) ×3 IMPLANT
GLOVE BIOGEL PI IND STRL 8 (GLOVE) ×1 IMPLANT
GLOVE BIOGEL PI INDICATOR 8 (GLOVE) ×2
GOWN STRL REUS W/ TWL LRG LVL3 (GOWN DISPOSABLE) ×1 IMPLANT
GOWN STRL REUS W/ TWL XL LVL3 (GOWN DISPOSABLE) ×1 IMPLANT
GOWN STRL REUS W/TWL LRG LVL3 (GOWN DISPOSABLE) ×2
GOWN STRL REUS W/TWL XL LVL3 (GOWN DISPOSABLE) ×2
INTRODUCER COOK 11FR (CATHETERS) IMPLANT
KIT BASIN OR (CUSTOM PROCEDURE TRAY) ×3 IMPLANT
KIT PORT POWER 8FR ISP CVUE (Port) ×3 IMPLANT
KIT TURNOVER KIT B (KITS) ×3 IMPLANT
NS IRRIG 1000ML POUR BTL (IV SOLUTION) ×3 IMPLANT
PAD ARMBOARD 7.5X6 YLW CONV (MISCELLANEOUS) ×3 IMPLANT
PENCIL BUTTON HOLSTER BLD 10FT (ELECTRODE) ×3 IMPLANT
SET INTRODUCER 12FR PACEMAKER (INTRODUCER) IMPLANT
SET SHEATH INTRODUCER 10FR (MISCELLANEOUS) IMPLANT
SHEATH COOK PEEL AWAY SET 9F (SHEATH) IMPLANT
SUT MNCRL AB 4-0 PS2 18 (SUTURE) ×3 IMPLANT
SUT PROLENE 2 0 SH 30 (SUTURE) ×3 IMPLANT
SUT VIC AB 3-0 SH 27 (SUTURE) ×2
SUT VIC AB 3-0 SH 27X BRD (SUTURE) ×1 IMPLANT
SYR 5ML LUER SLIP (SYRINGE) ×3 IMPLANT
TOWEL OR 17X24 6PK STRL BLUE (TOWEL DISPOSABLE) ×3 IMPLANT
TOWEL OR 17X26 10 PK STRL BLUE (TOWEL DISPOSABLE) ×3 IMPLANT
TRAY LAPAROSCOPIC MC (CUSTOM PROCEDURE TRAY) ×3 IMPLANT

## 2018-04-28 NOTE — Discharge Instructions (Signed)
    PORT-A-CATH: POST OP INSTRUCTIONS  Always review your discharge instruction sheet given to you by the facility where your surgery was performed.   1. A prescription for pain medication may be given to you upon discharge. Take your pain medication as prescribed, if needed. If narcotic pain medicine is not needed, then you make take acetaminophen (Tylenol) or ibuprofen (Advil) as needed.  2. Take your usually prescribed medications unless otherwise directed. 3. If you need a refill on your pain medication, please contact our office. All narcotic pain medicine now requires a paper prescription.  Phoned in and fax refills are no longer allowed by law.  Prescriptions will not be filled after 5 pm or on weekends.  4. You should follow a light diet for the remainder of the day after your procedure. 5. Most patients will experience some mild swelling and/or bruising in the area of the incision. It may take several days to resolve. 6. It is common to experience some constipation if taking pain medication after surgery. Increasing fluid intake and taking a stool softener (such as Colace) will usually help or prevent this problem from occurring. A mild laxative (Milk of Magnesia or Miralax) should be taken according to package directions if there are no bowel movements after 48 hours.  7. Unless discharge instructions indicate otherwise, you may remove your bandages 48 hours after surgery, and you may shower at that time. You may have steri-strips (small white skin tapes) in place directly over the incision.  These strips should be left on the skin for 7-10 days.  If your surgeon used Dermabond (skin glue) on the incision, you may shower in 24 hours.  The glue will flake off over the next 2-3 weeks.  8. If your port is left accessed at the end of surgery (needle left in port), the dressing cannot get wet and should only by changed by a healthcare professional. When the port is no longer accessed (when the  needle has been removed), follow step 7.   9. ACTIVITIES:  Limit activity involving your arms for the next 72 hours. Do no strenuous exercise or activity for 1 week. You may drive when you are no longer taking prescription pain medication, you can comfortably wear a seatbelt, and you can maneuver your car. 10.You may need to see your doctor in the office for a follow-up appointment.  Please       check with your doctor.  11.When you receive a new Port-a-Cath, you will get a product guide and        ID card.  Please keep them in case you need them.  WHEN TO CALL YOUR DOCTOR ([phone]): 1. Fever over 101.0 2. Chills 3. Continued bleeding from incision 4. Increased redness and tenderness at the site 5. Shortness of breath, difficulty breathing   The clinic staff is available to answer your questions during regular business hours. Please don't hesitate to call and ask to speak to one of the nurses or medical assistants for clinical concerns. If you have a medical emergency, go to the nearest emergency room or call 911.  A surgeon from [facility] is always on call at the hospital.     For further information, please visit [email or web address]      

## 2018-04-28 NOTE — Anesthesia Postprocedure Evaluation (Signed)
Anesthesia Post Note  Patient: Kathryn [name]  Procedure(s) Performed: INSERTION PORT-A-CATH WITH ULTRASOUND (N/A Chest)     Patient location during evaluation: PACU Anesthesia Type: General Level of consciousness: awake and alert Pain management: pain level controlled Vital Signs Assessment: post-procedure vital signs reviewed and stable Respiratory status: spontaneous breathing, nonlabored ventilation and respiratory function stable Cardiovascular status: blood pressure returned to baseline and stable Postop Assessment: no apparent nausea or vomiting Anesthetic complications: no    Last Vitals:  Vitals:   04/28/18 1450 04/28/18 1520  BP: 122/65 122/69  Pulse: 92 69  Resp: 13 11  Temp: (!) 36.1 C   SpO2: 100% 98%    Last Pain:  Vitals:   04/28/18 1450  TempSrc:   PainSc: 0-No pain                 [name],W. EDMOND

## 2018-04-28 NOTE — Op Note (Signed)
Preoperative diagnosis: PAC needed for chemotherapy   Postoperative diagnosis: Same  Procedure: Portacath Placement with C arm and U/S guidance   Surgeon: Rene Sizelove A Morse Brueggemann, MD, FACS  Anesthesia: General and 0.25 % marcaine with epinephrine  Clinical History and Indications: The patient is getting ready to begin chemotherapy for her cancer. She  needs a Port-A-Cath for venous access. Risk of bleeding, infection,  Collapse lung,  Death,  DVT,  Organ injury,  Mediastinal injury,  Injury to heart,  Injury to blood vessels,  Nerves,  Migration of catheter,  Embolization of catheter and the need for more surgery.  Description of Procedure: I have seen the patient in the holding area and confirmed the plans for the procedure as noted above. I reviewed the risks and complications again and the patient has no further questions. She wishes to proceed.   The patient was then taken to the operating room. After satisfactory general  anesthesia had been obtained the upper chest and lower neck were prepped and draped as a sterile field. The timeout was done.  The right internal jugular vein  was entered under U/S guidance  and the guidewire threaded into the superior vena cava right atrial area under fluoroscopic guidance. An incision was then made on the anterior chest wall and a subcutaneous pocket fashioned for the port reservoir.  The port tubing was then brought through a subcutaneous tunnel from the port site to the guidewire site.  The port and catheter were attached, locked  and flushed. The catheter was measured and cut to appropriate length.The dilator and peel-away sheath were then advanced over the guidewire while monitoring this with fluoroscopy. The guidewire and dilator were removed and the tubing threaded to approximately 21 cm. The peel-away sheath was then removed. The catheter aspirated and flushed easily. Using fluoroscopy the tip was in the superior vena cava right atrial junction area. It  aspirated and flushed easily. That aspirated and flushed easily.  The reservoir was secured to the fascia with 1 sutures of 2-0 Prolene. A final check with fluoroscopy was done to make sure we had no kinks and good positioning of the tip of the catheter. Everything appeared to be okay. The catheter was aspirated, flushed with dilute heparin and then concentrated aqueous heparin.  The incision was then closed with interrupted 3-0 Vicryl, and 4-0 Monocryl subcuticular with Dermabond on the skin.  There were no operative complications. Estimated blood loss was minimal. All counts were correct. The patient tolerated the procedure well.  Terik Haughey A Devlin Brink, MD, FACS  

## 2018-04-28 NOTE — Transfer of Care (Signed)
Immediate Anesthesia Transfer of Care Note  Patient: Kathryn [name]  Procedure(s) Performed: INSERTION PORT-A-CATH WITH ULTRASOUND (N/A Chest)  Patient Location: PACU  Anesthesia Type:General  Level of Consciousness: awake, alert  and patient cooperative  Airway & Oxygen Therapy: Patient Spontanous Breathing  Post-op Assessment: Report given to RN and Post -op Vital signs reviewed and stable  Post vital signs: Reviewed and stable  Last Vitals:  Vitals Value Taken Time  BP 122/65 04/28/2018  2:49 PM  Temp    Pulse 92 04/28/2018  2:54 PM  Resp 20 04/28/2018  2:54 PM  SpO2 98 % 04/28/2018  2:54 PM  Vitals shown include unvalidated device data.  Last Pain:  Vitals:   04/28/18 1216  TempSrc: Oral  PainSc: 0-No pain      Patients Stated Pain Goal: 3 (55/21/74 [id number])  Complications: No apparent anesthesia complications

## 2018-04-28 NOTE — Anesthesia Preprocedure Evaluation (Addendum)
Anesthesia Evaluation  Patient identified by MRN, date of birth, ID band Patient awake    Reviewed: Allergy & Precautions, NPO status , Patient's Chart, lab work & pertinent test results  History of Anesthesia Complications Negative for: history of anesthetic complications  Airway Mallampati: II  TM Distance: >3 FB Neck ROM: Full    Dental no notable dental hx. (+) Dental Advisory Given   Pulmonary neg pulmonary ROS,    Pulmonary exam normal        Cardiovascular negative cardio ROS Normal cardiovascular exam     Neuro/Psych negative neurological ROS  negative psych ROS   GI/Hepatic negative GI ROS, Neg liver ROS,   Endo/Other  negative endocrine ROS  Renal/GU negative Renal ROS  negative genitourinary   Musculoskeletal negative musculoskeletal ROS (+)   Abdominal   Peds negative pediatric ROS (+)  Hematology negative hematology ROS (+)   Anesthesia Other Findings   Reproductive/Obstetrics negative OB ROS                             Anesthesia Physical Anesthesia Plan  ASA: II  Anesthesia Plan: General   Post-op Pain Management:    Induction: Intravenous  PONV Risk Score and Plan: 3 and Ondansetron, Dexamethasone and Diphenhydramine  Airway Management Planned: LMA  Additional Equipment:   Intra-op Plan:   Post-operative Plan: Extubation in OR  Informed Consent: I have reviewed the patients History and Physical, chart, labs and discussed the procedure including the risks, benefits and alternatives for the proposed anesthesia with the patient or authorized representative who has indicated his/her understanding and acceptance.     Dental advisory given  Plan Discussed with: Anesthesiologist  Anesthesia Plan Comments:         Anesthesia Quick Evaluation

## 2018-04-28 NOTE — Interval H&P Note (Signed)
History and Physical Interval Note:  04/28/2018 1:39 PM  Kathryn [name]  has presented today for surgery, with the diagnosis of breast cancer  The various methods of treatment have been discussed with the patient and family. After consideration of risks, benefits and other options for treatment, the patient has consented to  Procedure(s): INSERTION PORT-A-CATH WITH ULTRASOUND (N/A) as a surgical intervention .  The patient's history has been reviewed, patient examined, no change in status, stable for surgery.  I have reviewed the patient's chart and labs.  Questions were answered to the patient's satisfaction.     [place]

## 2018-04-29 ENCOUNTER — Encounter (HOSPITAL_COMMUNITY): Payer: Self-pay | Admitting: Surgery

## 2018-05-02 ENCOUNTER — Encounter: Payer: BLUE CROSS/BLUE SHIELD | Admitting: Licensed Clinical Social Worker

## 2018-05-02 ENCOUNTER — Other Ambulatory Visit: Payer: BLUE CROSS/BLUE SHIELD

## 2018-05-03 ENCOUNTER — Inpatient Hospital Stay: Payer: BLUE CROSS/BLUE SHIELD

## 2018-05-03 ENCOUNTER — Encounter: Payer: Self-pay | Admitting: Adult Health

## 2018-05-03 ENCOUNTER — Telehealth: Payer: Self-pay | Admitting: Adult Health

## 2018-05-03 ENCOUNTER — Inpatient Hospital Stay (HOSPITAL_BASED_OUTPATIENT_CLINIC_OR_DEPARTMENT_OTHER): Payer: BLUE CROSS/BLUE SHIELD | Admitting: Adult Health

## 2018-05-03 ENCOUNTER — Telehealth: Payer: Self-pay

## 2018-05-03 VITALS — BP 140/79 | HR 83 | Temp 97.8°F | Resp 19 | Ht 64.25 in | Wt 158.7 lb

## 2018-05-03 DIAGNOSIS — C773 Secondary and unspecified malignant neoplasm of axilla and upper limb lymph nodes: Secondary | ICD-10-CM

## 2018-05-03 DIAGNOSIS — Z5111 Encounter for antineoplastic chemotherapy: Secondary | ICD-10-CM

## 2018-05-03 DIAGNOSIS — Z171 Estrogen receptor negative status [ER-]: Secondary | ICD-10-CM

## 2018-05-03 DIAGNOSIS — I700 Atherosclerosis of aorta: Secondary | ICD-10-CM

## 2018-05-03 DIAGNOSIS — J3489 Other specified disorders of nose and nasal sinuses: Secondary | ICD-10-CM

## 2018-05-03 DIAGNOSIS — R05 Cough: Secondary | ICD-10-CM

## 2018-05-03 DIAGNOSIS — C50412 Malignant neoplasm of upper-outer quadrant of left female breast: Secondary | ICD-10-CM

## 2018-05-03 DIAGNOSIS — Z8051 Family history of malignant neoplasm of kidney: Secondary | ICD-10-CM

## 2018-05-03 DIAGNOSIS — Z79899 Other long term (current) drug therapy: Secondary | ICD-10-CM

## 2018-05-03 DIAGNOSIS — Z7689 Persons encountering health services in other specified circumstances: Secondary | ICD-10-CM

## 2018-05-03 DIAGNOSIS — Z801 Family history of malignant neoplasm of trachea, bronchus and lung: Secondary | ICD-10-CM

## 2018-05-03 LAB — CBC WITH DIFFERENTIAL/PLATELET
Abs Immature Granulocytes: 0.01 10*3/uL (ref 0.00–0.07)
Basophils Absolute: 0 10*3/uL (ref 0.0–0.1)
Basophils Relative: 1 %
Eosinophils Absolute: 0.1 10*3/uL (ref 0.0–0.5)
Eosinophils Relative: 2 %
HCT: 42.5 % (ref 36.0–46.0)
Hemoglobin: 13.5 g/dL (ref 12.0–15.0)
Immature Granulocytes: 0 %
Lymphocytes Relative: 24 %
Lymphs Abs: 1.4 10*3/uL (ref 0.7–4.0)
MCH: 29.3 pg (ref 26.0–34.0)
MCHC: 31.8 g/dL (ref 30.0–36.0)
MCV: 92.4 fL (ref 80.0–100.0)
MONOS PCT: 8 %
Monocytes Absolute: 0.4 10*3/uL (ref 0.1–1.0)
Neutro Abs: 3.8 10*3/uL (ref 1.7–7.7)
Neutrophils Relative %: 65 %
Platelets: 255 10*3/uL (ref 150–400)
RBC: 4.6 MIL/uL (ref 3.87–5.11)
RDW: 13.4 % (ref 11.5–15.5)
WBC: 5.7 10*3/uL (ref 4.0–10.5)
nRBC: 0 % (ref 0.0–0.2)

## 2018-05-03 LAB — COMPREHENSIVE METABOLIC PANEL
ALT: 47 U/L — ABNORMAL HIGH (ref 0–44)
ANION GAP: 8 (ref 5–15)
AST: 34 U/L (ref 15–41)
Albumin: 3.7 g/dL (ref 3.5–5.0)
Alkaline Phosphatase: 116 U/L (ref 38–126)
BUN: 13 mg/dL (ref 6–20)
CO2: 26 mmol/L (ref 22–32)
Calcium: 9.1 mg/dL (ref 8.9–10.3)
Chloride: 106 mmol/L (ref 98–111)
Creatinine, Ser: 0.75 mg/dL (ref 0.44–1.00)
GFR calc Af Amer: >60 mL/min (ref >60)
GFR calc non Af Amer: >60 mL/min (ref >60)
Glucose, Bld: 73 mg/dL (ref 70–99)
Potassium: 4.2 mmol/L (ref 3.5–5.1)
Sodium: 140 mmol/L (ref 135–145)
Total Bilirubin: 0.7 mg/dL (ref 0.3–1.2)
Total Protein: 7.4 g/dL (ref 6.5–8.1)

## 2018-05-03 MED ORDER — PALONOSETRON HCL INJECTION 0.25 MG/5ML
INTRAVENOUS | Status: AC
  Filled 2018-05-03: qty 5

## 2018-05-03 MED ORDER — PALONOSETRON HCL INJECTION 0.25 MG/5ML
0.2500 mg | Freq: Once | INTRAVENOUS | Status: AC
  Administered 2018-05-03: 0.25 mg via INTRAVENOUS

## 2018-05-03 MED ORDER — SODIUM CHLORIDE 0.9 % IV SOLN
Freq: Once | INTRAVENOUS | Status: AC
  Administered 2018-05-03: 11:00:00 via INTRAVENOUS
  Filled 2018-05-03: qty 5

## 2018-05-03 MED ORDER — SODIUM CHLORIDE 0.9 % IV SOLN
Freq: Once | INTRAVENOUS | Status: AC
  Administered 2018-05-03: 10:00:00 via INTRAVENOUS
  Filled 2018-05-03: qty 250

## 2018-05-03 MED ORDER — SODIUM CHLORIDE 0.9% FLUSH
10.0000 mL | INTRAVENOUS | Status: DC | PRN
  Administered 2018-05-03: 10 mL
  Filled 2018-05-03: qty 10

## 2018-05-03 MED ORDER — DOXORUBICIN HCL CHEMO IV INJECTION 2 MG/ML
60.0000 mg/m2 | Freq: Once | INTRAVENOUS | Status: AC
  Administered 2018-05-03: 108 mg via INTRAVENOUS
  Filled 2018-05-03: qty 54

## 2018-05-03 MED ORDER — SODIUM CHLORIDE 0.9 % IV SOLN
600.0000 mg/m2 | Freq: Once | INTRAVENOUS | Status: AC
  Administered 2018-05-03: 1080 mg via INTRAVENOUS
  Filled 2018-05-03: qty 54

## 2018-05-03 MED ORDER — HEPARIN SOD (PORK) LOCK FLUSH 100 UNIT/ML IV SOLN
500.0000 [IU] | Freq: Once | INTRAVENOUS | Status: AC | PRN
  Administered 2018-05-03: 500 [IU]
  Filled 2018-05-03: qty 5

## 2018-05-03 NOTE — Progress Notes (Signed)
[facility]  [phone] [phone]    ID: Kathryn [name] DOB: [birth year]  MR#: [id number]  MCN#:[id number]  Patient Care Team: Kandace Blitz, MD as PCP - General (Obstetrics and Gynecology) Rockwell Germany, RN as Oncology Nurse Navigator Mauro Kaufmann, RN as Oncology Nurse Navigator Erroll Luna, MD as Consulting Physician (General Surgery) Magrinat, Virgie Dad, MD as Consulting Physician (Oncology) Kyung Rudd, MD as Consulting Physician (Radiation Oncology) OTHER MD: Rana Snare (OBGYN)   CHIEF COMPLAINT: Triple negative breast cancer  CURRENT TREATMENT: Neoadjuvant chemotherapy   HISTORY OF CURRENT ILLNESS: From the original intake note:  Kathryn [name] presented with a palpable subareolar left breast lump, nipple retraction, and discharge for approximately 1 week. She underwent bilateral diagnostic mammography with tomography and left breast ultrasonography at [facility] on 04/01/2018 showing: Breast Density Category C. An irregular hyperdense mass is seen in the left subareolar region. An additional oval, circumscribed mass is seen in the inferior central aspect at posterior depth. No additional suspicious findings are identified within the remainder of the left breast. On physical exam, there is a 2-3 cm firm, fixed lump in the subareolar region on the left. Subtle skin changes and retraction is noted along the left nipple. Targeted ultrasound is performed, showing an irregular hypoechoic mass with associated vascularity at the 12 o'clock retroareolar position on the left. Overall measurements are 2.6 x 2.3 x 1.2 cm. This corresponds with the mammographic finding. Two adjacent circumscribed hypoechoic masses are identified in the deep 6 o'clock position 2 cm from the nipple. They measure 0.8 x 0.6 x 0.4 cm and 1.3 x 1.1 x 0.7 cm. There is no seated vascularity. This corresponds with the additional mammographic finding and likely represents  minimally complicated cyst. Evaluation of the left axilla demonstrates a markedly enlarged abnormal lymph node with complete hilar replacement. It measures up to 5 cm in long axis dimension. No suspicious mammographic findings on the right.   Accordingly on 04/06/2018 she proceeded to biopsy of the left breast area in question. The pathology from this procedure showed ([id number]): invasive ductal carcinoma, grade III, with lymphovascular invasion. Prognostic indicators significant for: estrogen receptor, 0% negative and progesterone receptor, 0% negative. Proliferation marker Ki67 at 70%. HER2 equivocal (2+) by immunohistochemistry, but negative by FISH (print3ed report pending).  On the same day she underwent a biopsy of the left axillary lymph node on 04/06/2018 showing ([id number]): metastatic carcinoma in 1 of 1 lymph node (1/1).   The patient's subsequent history is as detailed below.   INTERVAL HISTORY: Kathryn [name] returns today for follow-up and treatment of her triple negative breast cancer. She is accompanied by her daughter Fara Chute.    Today Kathryn [name] will start receiving neoadjuvant chemotherapy with Doxorubicin and Cyclophosphamide.  She will receive this on day 1 of a 14 day cycle with udenyca on day 3.    She has undergone her chemotherapy education, she has received her anti emetic map and has her medications.  She has undergone port placement and tolerated it well.    REVIEW OF SYSTEMS: Kathryn [name] is feeling positive and ready to start receiving chemotherapy today.  She denies any unusual headaches, vision changes, new lumps or bumps, new pain.  She is without chest pain, palpitations, cough, or shortness of breath.  She denies nausea, vomiting, bowel or bladder changes.  A detailed ROS was otherwise non contributory.   PAST MEDICAL HISTORY: Past Medical History:  Diagnosis Date  . Breast cancer ([place])  stage 3 - left  . Family history of kidney cancer   . Family history of melanoma       PAST SURGICAL HISTORY: Past Surgical History:  Procedure Laterality Date  . CESAREAN SECTION     x3  . PORTACATH PLACEMENT N/A 04/28/2018   Procedure: INSERTION PORT-A-CATH WITH ULTRASOUND;  Surgeon: Erroll Luna, MD;  Location: [facility] OR;  Service: General;  Laterality: N/A;     FAMILY HISTORY: Family History  Problem Relation Age of Onset  . Kidney cancer Sister 65       d. 36  . Lung cancer Paternal Uncle   . Melanoma Sister   . Melanoma Sister    Lovene's father died from congestive heart failure at age 23. Patients' mother is 55 as of 03/2018. The patient has 1 brother and 3 sisters. Patient denies anyone in her family having breast, ovarian, prostate, or pancreatic cancer. Carlos's sister, [name], was diagnosed with Kidney Cancer at 48. Essa has an uncle and a cousin that were diagnosed with lung cancer, but they were both heavy smokers.    GYNECOLOGIC HISTORY:  No LMP recorded. Patient is postmenopausal. 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 Her husband, Keatyn Luck, owns [employer]. Together, they have three children, [name], [place], and [place]. Henryetta Corriveau is 34, lives in [place], and works as a Theme park manager for the [employer]. Xela Oregel is 2, lives in [place], and is a Equities trader at [employer]. Averly Ericson is 63, is a Ship broker, and lives at home with [name] and [name].    ADVANCED DIRECTIVES: Kathryn [name]'s husband, Anaija Wissink, is automatically her healthcare power of attorney.    HEALTH MAINTENANCE: Social History   Tobacco Use  . Smoking status: Never Smoker  . Smokeless tobacco: Never Used  Substance Use Topics  . Alcohol use: Yes    Comment: socially  . Drug use: Never     Colonoscopy: no  PAP: 2015  Bone density: no   No Known Allergies  Current Outpatient Medications  Medication Sig Dispense Refill  . dexamethasone (DECADRON) 4 MG tablet Take 2 tablets by mouth once a day on the day after chemotherapy and then take 2 tablets two times a day for 2 days. Take with food. 30 tablet 1  . lidocaine-prilocaine (EMLA) cream Apply to affected area once 30 g 3  . LORazepam (ATIVAN) 0.5 MG tablet Take 1 tablet (0.5 mg total) by mouth at bedtime as needed (Nausea or vomiting). 30 tablet 0  . prochlorperazine (COMPAZINE) 10 MG tablet Take 1 tablet (10 mg total) by mouth every 6 (six) hours as needed (Nausea or vomiting). 30 tablet 1  . ibuprofen (ADVIL,MOTRIN) 800 MG tablet Take 1 tablet (800 mg total) by mouth every 8 (eight) hours as needed. (Patient not taking: Reported on 05/03/2018) 30 tablet 0  . oxyCODONE (OXY IR/ROXICODONE) 5 MG immediate release tablet Take 1 tablet (5 mg total) by mouth every 6 (six) hours as needed for severe pain. (Patient not taking: Reported on 05/03/2018) 12 tablet 0   No current facility-administered medications for this visit.      OBJECTIVE:   Vitals:   05/03/18 0919  BP: 140/79  Pulse: 83  Resp: 19  Temp: 97.8 F (36.6 C)  SpO2: 100%     Body mass index is 27.03 kg/m.   Wt Readings from Last 3 Encounters:  05/03/18 158 lb 11.2 oz (72 kg)  04/28/18 158 lb 12.8 oz (72 kg)  04/27/18 158 lb 12.8 oz (72 kg)  ECOG FS:0 - Asymptomatic GENERAL: Patient is a well appearing female in no acute distress HEENT:  Sclerae anicteric.  Oropharynx clear and moist. No ulcerations or evidence of oropharyngeal candidiasis. Neck is supple.  NODES:  No cervical, supraclavicular, or right axillary lymphadenopathy palpated. Left 2cm axillary adenopathy palpated BREAST EXAM:  2-3cm firm mass underneath left nipple nipple is flattened and fixed  LUNGS:  Clear to auscultation bilaterally.  No wheezes or rhonchi. HEART:  Regular rate and rhythm.  No murmur appreciated. ABDOMEN:  Soft, nontender.  Positive, normoactive bowel sounds. No organomegaly palpated. MSK:  No focal spinal tenderness to palpation. Full range of motion bilaterally in the upper extremities. EXTREMITIES:  No peripheral edema.   SKIN:  Clear with no obvious rashes or skin changes. No nail dyscrasia. NEURO:  Nonfocal. Well oriented.  Appropriate affect.         LAB RESULTS:  CMP     Component Value Date/Time   NA 140 05/03/2018 0841   K 4.2 05/03/2018 0841   CL 106 05/03/2018 0841   CO2 26 05/03/2018 0841   GLUCOSE 73 05/03/2018 0841   BUN 13 05/03/2018 0841   CREATININE 0.75 05/03/2018 0841   CREATININE 0.83 04/13/2018 0843   CALCIUM 9.1 05/03/2018 0841   PROT 7.4 05/03/2018 0841   ALBUMIN 3.7 05/03/2018 0841   AST 34 05/03/2018 0841   AST 15 04/13/2018 0843   ALT 47 (H) 05/03/2018 0841   ALT 16 04/13/2018 0843   ALKPHOS 116 05/03/2018 0841   BILITOT 0.7 05/03/2018 0841   BILITOT 0.5 04/13/2018 0843   GFRNONAA >60 05/03/2018 0841   GFRNONAA >60 04/13/2018 0843   GFRAA >60 05/03/2018 0841   GFRAA >60 04/13/2018 0843    No results found for: TOTALPROTELP, ALBUMINELP, A1GS, A2GS, BETS, BETA2SER, GAMS, MSPIKE, SPEI  No results found for: Nils Pyle, [facility]  Lab Results  Component Value Date   WBC 5.7 05/03/2018   NEUTROABS 3.8 05/03/2018   HGB 13.5 05/03/2018   HCT 42.5 05/03/2018   MCV 92.4 05/03/2018   PLT 255 05/03/2018    '@LASTCHEMISTRY' @  No results found for: LABCA2  No components found for: ZOXWRU045  No results for input(s): INR in the last 168 hours.  No results found for: LABCA2  No results found for: WUJ811  No results found for: BJY782  No results found for: NFA213  No results found for: CA2729  No components found for: HGQUANT  No results found for: CEA1 / No results found for: CEA1   No results found for: AFPTUMOR  No results found for: CHROMOGRNA  No results found for:  PSA1  Appointment on 05/03/2018  Component Date Value Ref Range Status  . WBC 05/03/2018 5.7  4.0 - 10.5 K/uL Final  . RBC 05/03/2018 4.60  3.87 - 5.11 MIL/uL Final  . Hemoglobin 05/03/2018 13.5  12.0 - 15.0 g/dL Final  . HCT 05/03/2018 42.5  36.0 - 46.0 % Final  . MCV 05/03/2018 92.4  80.0 - 100.0 fL Final  . MCH 05/03/2018 29.3  26.0 - 34.0 pg Final  . MCHC 05/03/2018 31.8  30.0 - 36.0 g/dL Final  . RDW 05/03/2018 13.4  11.5 - 15.5 % Final  . Platelets 05/03/2018 255  150 - 400 K/uL Final  . nRBC 05/03/2018 0.0  0.0 - 0.2 % Final  . Neutrophils Relative % 05/03/2018 65  % Final  . Neutro Abs 05/03/2018 3.8  1.7 - 7.7 K/uL Final  . Lymphocytes Relative 05/03/2018 24  % Final  . Lymphs Abs 05/03/2018 1.4  0.7 - 4.0 K/uL Final  . Monocytes Relative 05/03/2018 8  % Final  . Monocytes Absolute 05/03/2018 0.4  0.1 - 1.0 K/uL Final  . Eosinophils Relative 05/03/2018 2  % Final  . Eosinophils Absolute 05/03/2018 0.1  0.0 - 0.5 K/uL Final  . Basophils Relative 05/03/2018 1  % Final  . Basophils Absolute 05/03/2018 0.0  0.0 - 0.1 K/uL Final  . Immature Granulocytes 05/03/2018 0  % Final  . Abs Immature Granulocytes 05/03/2018 0.01  0.00 - 0.07 K/uL Final   Performed at [facility], [place] [address], [place], [place] [address]  . Sodium 05/03/2018 140  135 - 145 mmol/L Final  . Potassium 05/03/2018 4.2  3.5 - 5.1 mmol/L Final  . Chloride 05/03/2018 106  98 - 111 mmol/L Final  . CO2 05/03/2018 26  22 - 32 mmol/L Final  . Glucose, Bld 05/03/2018 73  70 - 99 mg/dL Final  . BUN 05/03/2018 13  6 - 20 mg/dL Final  . Creatinine, Ser 05/03/2018 0.75  0.44 - 1.00 mg/dL Final  . Calcium 05/03/2018 9.1  8.9 - 10.3 mg/dL Final  . Total Protein 05/03/2018 7.4  6.5 - 8.1 g/dL Final  . Albumin 05/03/2018 3.7  3.5 - 5.0 g/dL Final  . AST 05/03/2018 34  15 - 41 U/L Final  . ALT 05/03/2018 47* 0 - 44 U/L Final  . Alkaline Phosphatase 05/03/2018 116  38 - 126 U/L Final  . Total  Bilirubin 05/03/2018 0.7  0.3 - 1.2 mg/dL Final  . GFR calc non Af Amer 05/03/2018 >60  >60 mL/min Final  . GFR calc Af Amer 05/03/2018 >60  >60 mL/min Final  . Anion gap 05/03/2018 8  5 - 15 Final   Performed at [facility], [place] [address] [place], [place] [address]    (this displays the last labs from the last 3 days)  No results found for: TOTALPROTELP, ALBUMINELP, A1GS, A2GS, BETS, BETA2SER, GAMS, MSPIKE, SPEI (this displays SPEP labs)  No results found for: KPAFRELGTCHN, LAMBDASER, KAPLAMBRATIO (kappa/lambda light chains)  No results found for: HGBA, HGBA2QUANT, HGBFQUANT, HGBSQUAN (Hemoglobinopathy evaluation)   No results found for: LDH  No results found for: IRON, TIBC, IRONPCTSAT (Iron and TIBC)  No results found for: FERRITIN  Urinalysis No results found for: COLORURINE, APPEARANCEUR, LABSPEC, PHURINE, GLUCOSEU, HGBUR, BILIRUBINUR, KETONESUR, PROTEINUR, UROBILINOGEN, NITRITE, LEUKOCYTESUR   STUDIES:     ELIGIBLE FOR AVAILABLE RESEARCH PROTOCOL: [id number]   ASSESSMENT: 58 y.o. [place], [place] woman status post left breast upper outer quadrant biopsy 04/06/2018 for a clinical T2 N2, stage IIIC invasive ductal carcinoma, grade 3, triple negative, with an MIB-1 of 70%  (a) breast MRI 04/19/2018 shows a 5 cm central breast lesion with multiple satellite nodules and greater then 3 abnormal axillary lymph nodes  (b) baseline echocardiogram 04/25/2018 shows an ejection fraction in the 60-65% range  (c) chest CT scan and bone scan showed no metastatic disease.  Nonspecific rib changes will need to be followed  (1) neoadjuvant chemotherapy will consist of doxorubicin and cyclophosphamide in dose dense fashion x4, starting 05/03/2018, to be followed by weekly paclitaxel and carboplatin x12  (  2) definitive surgery to follow  (3) adjuvant radiation after surgery  (4) genetics testing 04/27/2018   PLAN: Kathryn [name] is doing well today.  Her labs are normal.   She has undergone her chemotherapy school and port placement.  Her echocardiogram was normal.  She has her anti emetics and understands how to take them.  She will proceed with her first cycle of neoadjuvant Doxorubicin and Cyclophosphamide today.  She will return in 2 days for [facility] support.    We reviewed her anti nausea medication and possible side effects she may experience.  I reviewed with her to please call us for any questions or concerns whatsoever, as we are happy to answer anything she is unsure about, or see her acutely even, if needed.  Kathryn [name] will return in 2 days for injection, and then in one week for labs and f/u with Dr. Jana Hakim to review how she tolerated her chemotherapy.  She knows to call for any questions or concerns prior to her next appointment with [place].  A total of (20) minutes of face-to-face time was spent with this patient with greater than 50% of that time in counseling and care-coordination.   Wilber Bihari, NP  05/03/18 9:49 AM Medical Oncology and Hematology [facility] [address] [place], [place] [address] Tel. [phone]    Fax. [phone]

## 2018-05-03 NOTE — Telephone Encounter (Signed)
Magic mouthwash called in to [facility] drug for patient.

## 2018-05-03 NOTE — Telephone Encounter (Signed)
Tried to reach regarding 3/24 I did leave a message

## 2018-05-03 NOTE — Patient Instructions (Signed)
[place] Discharge Instructions for Patients Receiving Chemotherapy  Today you received the following chemotherapy agents Adriamycin and Cytoxan  To help prevent nausea and vomiting after your treatment, we encourage you to take your nausea medication as directed   If you develop nausea and vomiting that is not controlled by your nausea medication, call the clinic.   BELOW ARE SYMPTOMS THAT SHOULD BE REPORTED IMMEDIATELY:  *FEVER GREATER THAN 100.5 F  *CHILLS WITH OR WITHOUT FEVER  NAUSEA AND VOMITING THAT IS NOT CONTROLLED WITH YOUR NAUSEA MEDICATION  *UNUSUAL SHORTNESS OF BREATH  *UNUSUAL BRUISING OR BLEEDING  TENDERNESS IN MOUTH AND THROAT WITH OR WITHOUT PRESENCE OF ULCERS  *URINARY PROBLEMS  *BOWEL PROBLEMS  UNUSUAL RASH Items with * indicate a potential emergency and should be followed up as soon as possible.  Feel free to call the clinic should you have any questions or concerns. The clinic phone number is [phone].  Please show the [facility] at check-in to the Emergency Department and triage nurse.   Doxorubicin (adriamycin) injection What is this medicine? DOXORUBICIN (dox oh ROO bi sin) is a chemotherapy drug. It is used to treat many kinds of cancer like leukemia, lymphoma, neuroblastoma, sarcoma, and Wilms' tumor. It is also used to treat bladder cancer, breast cancer, lung cancer, ovarian cancer, stomach cancer, and thyroid cancer. This medicine may be used for other purposes; ask your health care provider or pharmacist if you have questions. COMMON BRAND NAME(S): Adriamycin, Adriamycin PFS, Adriamycin RDF, Rubex What should I tell my health care provider before I take this medicine? They need to know if you have any of these conditions: -heart disease -history of low blood counts caused by a medicine -liver disease -recent or ongoing radiation therapy -an unusual or allergic reaction to doxorubicin, other chemotherapy agents,  other medicines, foods, dyes, or preservatives -pregnant or trying to get pregnant -breast-feeding How should I use this medicine? This drug is given as an infusion into a vein. It is administered in a hospital or clinic by a specially trained health care professional. If you have pain, swelling, burning or any unusual feeling around the site of your injection, tell your health care professional right away. Talk to your pediatrician regarding the use of this medicine in children. Special care may be needed. Overdosage: If you think you have taken too much of this medicine contact a poison control center or emergency room at once. NOTE: This medicine is only for you. Do not share this medicine with others. What if I miss a dose? It is important not to miss your dose. Call your doctor or health care professional if you are unable to keep an appointment. What may interact with this medicine? This medicine may interact with the following medications: -6-mercaptopurine -paclitaxel -phenytoin -St. John's Wort -trastuzumab -verapamil This list may not describe all possible interactions. Give your health care provider a list of all the medicines, herbs, non-prescription drugs, or dietary supplements you use. Also tell them if you smoke, drink alcohol, or use illegal drugs. Some items may interact with your medicine. What should I watch for while using this medicine? This drug may make you feel generally unwell. This is not uncommon, as chemotherapy can affect healthy cells as well as cancer cells. Report any side effects. Continue your course of treatment even though you feel ill unless your doctor tells you to stop. There is a maximum amount of this medicine you should receive throughout your life. The amount  depends on the medical condition being treated and your overall health. Your doctor will watch how much of this medicine you receive in your lifetime. Tell your doctor if you have taken this  medicine before. You may need blood work done while you are taking this medicine. Your urine may turn red for a few days after your dose. This is not blood. If your urine is dark or brown, call your doctor. In some cases, you may be given additional medicines to help with side effects. Follow all directions for their use. Call your doctor or health care professional for advice if you get a fever, chills or sore throat, or other symptoms of a cold or flu. Do not treat yourself. This drug decreases your body's ability to fight infections. Try to avoid being around people who are sick. This medicine may increase your risk to bruise or bleed. Call your doctor or health care professional if you notice any unusual bleeding. Talk to your doctor about your risk of cancer. You may be more at risk for certain types of cancers if you take this medicine. Do not become pregnant while taking this medicine or for 6 months after stopping it. Women should inform their doctor if they wish to become pregnant or think they might be pregnant. Men should not father a child while taking this medicine and for 6 months after stopping it. There is a potential for serious side effects to an unborn child. Talk to your health care professional or pharmacist for more information. Do not breast-feed an infant while taking this medicine. This medicine has caused ovarian failure in some women and reduced sperm counts in some men This medicine may interfere with the ability to have a child. Talk with your doctor or health care professional if you are concerned about your fertility. This medicine may cause a decrease in Co-Enzyme Q-10. You should make sure that you get enough Co-Enzyme Q-10 while you are taking this medicine. Discuss the foods you eat and the vitamins you take with your health care professional. What side effects may I notice from receiving this medicine? Side effects that you should report to your doctor or health care  professional as soon as possible: -allergic reactions like skin rash, itching or hives, swelling of the face, lips, or tongue -breathing problems -chest pain -fast or irregular heartbeat -low blood counts - this medicine may decrease the number of white blood cells, red blood cells and platelets. You may be at increased risk for infections and bleeding. -pain, redness, or irritation at site where injected -signs of infection - fever or chills, cough, sore throat, pain or difficulty passing urine -signs of decreased platelets or bleeding - bruising, pinpoint red spots on the skin, black, tarry stools, blood in the urine -swelling of the ankles, feet, hands -tiredness -weakness Side effects that usually do not require medical attention (report to your doctor or health care professional if they continue or are bothersome): -diarrhea -hair loss -mouth sores -nail discoloration or damage -nausea -red colored urine -vomiting This list may not describe all possible side effects. Call your doctor for medical advice about side effects. You may report side effects to FDA at 1-800-FDA-1088. Where should I keep my medicine? This drug is given in a hospital or clinic and will not be stored at home. NOTE: This sheet is a summary. It may not cover all possible information. If you have questions about this medicine, talk to your doctor, pharmacist, or health care provider.  2019 Elsevier/Gold Standard (2016-09-23 11:01:26)  Cyclophosphamide (cytoxan) injection What is this medicine? CYCLOPHOSPHAMIDE (sye kloe FOSS fa mide) is a chemotherapy drug. It slows the growth of cancer cells. This medicine is used to treat many types of cancer like lymphoma, myeloma, leukemia, breast cancer, and ovarian cancer, to name a few. This medicine may be used for other purposes; ask your health care provider or pharmacist if you have questions. COMMON BRAND NAME(S): Cytoxan, Neosar What should I tell my health care  provider before I take this medicine? They need to know if you have any of these conditions: -blood disorders -history of other chemotherapy -infection -kidney disease -liver disease -recent or ongoing radiation therapy -tumors in the bone marrow -an unusual or allergic reaction to cyclophosphamide, other chemotherapy, other medicines, foods, dyes, or preservatives -pregnant or trying to get pregnant -breast-feeding How should I use this medicine? This drug is usually given as an injection into a vein or muscle or by infusion into a vein. It is administered in a hospital or clinic by a specially trained health care professional. Talk to your pediatrician regarding the use of this medicine in children. Special care may be needed. Overdosage: If you think you have taken too much of this medicine contact a poison control center or emergency room at once. NOTE: This medicine is only for you. Do not share this medicine with others. What if I miss a dose? It is important not to miss your dose. Call your doctor or health care professional if you are unable to keep an appointment. What may interact with this medicine? This medicine may interact with the following medications: -amiodarone -amphotericin B -azathioprine -certain antiviral medicines for HIV or AIDS such as protease inhibitors (e.g., indinavir, ritonavir) and zidovudine -certain blood pressure medications such as benazepril, captopril, enalapril, fosinopril, lisinopril, moexipril, monopril, perindopril, quinapril, ramipril, trandolapril -certain cancer medications such as anthracyclines (e.g., daunorubicin, doxorubicin), busulfan, cytarabine, paclitaxel, pentostatin, tamoxifen, trastuzumab -certain diuretics such as chlorothiazide, chlorthalidone, hydrochlorothiazide, indapamide, metolazone -certain medicines that treat or prevent blood clots like warfarin -certain muscle relaxants such as  succinylcholine -cyclosporine -etanercept -indomethacin -medicines to increase blood counts like filgrastim, pegfilgrastim, sargramostim -medicines used as general anesthesia -metronidazole -natalizumab This list may not describe all possible interactions. Give your health care provider a list of all the medicines, herbs, non-prescription drugs, or dietary supplements you use. Also tell them if you smoke, drink alcohol, or use illegal drugs. Some items may interact with your medicine. What should I watch for while using this medicine? Visit your doctor for checks on your progress. This drug may make you feel generally unwell. This is not uncommon, as chemotherapy can affect healthy cells as well as cancer cells. Report any side effects. Continue your course of treatment even though you feel ill unless your doctor tells you to stop. Drink water or other fluids as directed. Urinate often, even at night. In some cases, you may be given additional medicines to help with side effects. Follow all directions for their use. Call your doctor or health care professional for advice if you get a fever, chills or sore throat, or other symptoms of a cold or flu. Do not treat yourself. This drug decreases your body's ability to fight infections. Try to avoid being around people who are sick. This medicine may increase your risk to bruise or bleed. Call your doctor or health care professional if you notice any unusual bleeding. Be careful brushing and flossing your teeth or using a toothpick because  you may get an infection or bleed more easily. If you have any dental work done, tell your dentist you are receiving this medicine. You may get drowsy or dizzy. Do not drive, use machinery, or do anything that needs mental alertness until you know how this medicine affects you. Do not become pregnant while taking this medicine or for 1 year after stopping it. Women should inform their doctor if they wish to become  pregnant or think they might be pregnant. Men should not father a child while taking this medicine and for 4 months after stopping it. There is a potential for serious side effects to an unborn child. Talk to your health care professional or pharmacist for more information. Do not breast-feed an infant while taking this medicine. This medicine may interfere with the ability to have a child. This medicine has caused ovarian failure in some women. This medicine has caused reduced sperm counts in some men. You should talk with your doctor or health care professional if you are concerned about your fertility. If you are going to have surgery, tell your doctor or health care professional that you have taken this medicine. What side effects may I notice from receiving this medicine? Side effects that you should report to your doctor or health care professional as soon as possible: -allergic reactions like skin rash, itching or hives, swelling of the face, lips, or tongue -low blood counts - this medicine may decrease the number of white blood cells, red blood cells and platelets. You may be at increased risk for infections and bleeding. -signs of infection - fever or chills, cough, sore throat, pain or difficulty passing urine -signs of decreased platelets or bleeding - bruising, pinpoint red spots on the skin, black, tarry stools, blood in the urine -signs of decreased red blood cells - unusually weak or tired, fainting spells, lightheadedness -breathing problems -dark urine -dizziness -palpitations -swelling of the ankles, feet, hands -trouble passing urine or change in the amount of urine -weight gain -yellowing of the eyes or skin Side effects that usually do not require medical attention (report to your doctor or health care professional if they continue or are bothersome): -changes in nail or skin color -hair loss -missed menstrual periods -mouth sores -nausea, vomiting This list may not  describe all possible side effects. Call your doctor for medical advice about side effects. You may report side effects to FDA at 1-800-FDA-1088. Where should I keep my medicine? This drug is given in a hospital or clinic and will not be stored at home. NOTE: This sheet is a summary. It may not cover all possible information. If you have questions about this medicine, talk to your doctor, pharmacist, or health care provider.  2019 Elsevier/Gold Standard (2011-12-25 16:22:58)

## 2018-05-04 ENCOUNTER — Telehealth: Payer: Self-pay

## 2018-05-04 NOTE — Telephone Encounter (Signed)
Spoke with patient regarding first time chemo follow up.  Patient "feels great."  Denies any fever, nausea/vomiting, or diarrhea.  Pt tolerating liquids and foods well.    Questions answered regarding anti emetics.  No further needs at this time.

## 2018-05-05 ENCOUNTER — Other Ambulatory Visit: Payer: Self-pay

## 2018-05-05 ENCOUNTER — Inpatient Hospital Stay: Payer: BLUE CROSS/BLUE SHIELD

## 2018-05-05 DIAGNOSIS — C50412 Malignant neoplasm of upper-outer quadrant of left female breast: Secondary | ICD-10-CM | POA: Diagnosis not present

## 2018-05-05 DIAGNOSIS — Z171 Estrogen receptor negative status [ER-]: Principal | ICD-10-CM

## 2018-05-05 MED ORDER — PEGFILGRASTIM-CBQV 6 MG/0.6ML ~~LOC~~ SOSY
6.0000 mg | PREFILLED_SYRINGE | Freq: Once | SUBCUTANEOUS | Status: AC
  Administered 2018-05-05: 6 mg via SUBCUTANEOUS

## 2018-05-05 NOTE — Patient Instructions (Signed)
Pegfilgrastim injection  What is this medicine?  PEGFILGRASTIM (PEG fil gra stim) is a long-acting granulocyte colony-stimulating factor that stimulates the growth of neutrophils, a type of white blood cell important in the body's fight against infection. It is used to reduce the incidence of fever and infection in patients with certain types of cancer who are receiving chemotherapy that affects the bone marrow, and to increase survival after being exposed to high doses of radiation.  This medicine may be used for other purposes; ask your health care provider or pharmacist if you have questions.  COMMON BRAND NAME(S): Fulphila, Neulasta, UDENYCA  What should I tell my health care provider before I take this medicine?  They need to know if you have any of these conditions:  -kidney disease  -latex allergy  -ongoing radiation therapy  -sickle cell disease  -skin reactions to acrylic adhesives (On-Body Injector only)  -an unusual or allergic reaction to pegfilgrastim, filgrastim, other medicines, foods, dyes, or preservatives  -pregnant or trying to get pregnant  -breast-feeding  How should I use this medicine?  This medicine is for injection under the skin. If you get this medicine at home, you will be taught how to prepare and give the pre-filled syringe or how to use the On-body Injector. Refer to the patient Instructions for Use for detailed instructions. Use exactly as directed. Tell your healthcare provider immediately if you suspect that the On-body Injector may not have performed as intended or if you suspect the use of the On-body Injector resulted in a missed or partial dose.  It is important that you put your used needles and syringes in a special sharps container. Do not put them in a trash can. If you do not have a sharps container, call your pharmacist or healthcare provider to get one.  Talk to your pediatrician regarding the use of this medicine in children. While this drug may be prescribed for  selected conditions, precautions do apply.  Overdosage: If you think you have taken too much of this medicine contact a poison control center or emergency room at once.  NOTE: This medicine is only for you. Do not share this medicine with others.  What if I miss a dose?  It is important not to miss your dose. Call your doctor or health care professional if you miss your dose. If you miss a dose due to an On-body Injector failure or leakage, a new dose should be administered as soon as possible using a single prefilled syringe for manual use.  What may interact with this medicine?  Interactions have not been studied.  Give your health care provider a list of all the medicines, herbs, non-prescription drugs, or dietary supplements you use. Also tell them if you smoke, drink alcohol, or use illegal drugs. Some items may interact with your medicine.  This list may not describe all possible interactions. Give your health care provider a list of all the medicines, herbs, non-prescription drugs, or dietary supplements you use. Also tell them if you smoke, drink alcohol, or use illegal drugs. Some items may interact with your medicine.  What should I watch for while using this medicine?  You may need blood work done while you are taking this medicine.  If you are going to need a MRI, CT scan, or other procedure, tell your doctor that you are using this medicine (On-Body Injector only).  What side effects may I notice from receiving this medicine?  Side effects that you should report to   your doctor or health care professional as soon as possible:  -allergic reactions like skin rash, itching or hives, swelling of the face, lips, or tongue  -back pain  -dizziness  -fever  -pain, redness, or irritation at site where injected  -pinpoint red spots on the skin  -red or dark-brown urine  -shortness of breath or breathing problems  -stomach or side pain, or pain at the shoulder  -swelling  -tiredness  -trouble passing urine or  change in the amount of urine  Side effects that usually do not require medical attention (report to your doctor or health care professional if they continue or are bothersome):  -bone pain  -muscle pain  This list may not describe all possible side effects. Call your doctor for medical advice about side effects. You may report side effects to FDA at 1-800-FDA-1088.  Where should I keep my medicine?  Keep out of the reach of children.  If you are using this medicine at home, you will be instructed on how to store it. Throw away any unused medicine after the expiration date on the label.  NOTE: This sheet is a summary. It may not cover all possible information. If you have questions about this medicine, talk to your doctor, pharmacist, or health care provider.   2019 [employer]vier/Gold Standard (2017-05-17 16:57:08)

## 2018-05-09 NOTE — Progress Notes (Signed)
[place]  [phone]) [phone] [phone]    ID: Kathryn [name] DOB: 11-02-1960  MR#: [id number]  GYI#:[id number]  Patient Care Team: Kandace Blitz, MD as PCP - General (Obstetrics and Gynecology) Rockwell Germany, RN as Oncology Nurse Navigator Mauro Kaufmann, RN as Oncology Nurse Navigator Erroll Luna, MD as Consulting Physician (General Surgery) Shanora Christensen, Virgie Dad, MD as Consulting Physician (Oncology) Kyung Rudd, MD as Consulting Physician (Radiation Oncology) OTHER MD: Rana Snare (OBGYN)   CHIEF COMPLAINT: Triple negative breast cancer  CURRENT TREATMENT: Neoadjuvant chemotherapy   HISTORY OF CURRENT ILLNESS: From the original intake note:  Kathryn [name] presented with a palpable subareolar left breast lump, nipple retraction, and discharge for approximately 1 week. She underwent bilateral diagnostic mammography with tomography and left breast ultrasonography at [facility] on 04/01/2018 showing: Breast Density Category C. An irregular hyperdense mass is seen in the left subareolar region. An additional oval, circumscribed mass is seen in the inferior central aspect at posterior depth. No additional suspicious findings are identified within the remainder of the left breast. On physical exam, there is a 2-3 cm firm, fixed lump in the subareolar region on the left. Subtle skin changes and retraction is noted along the left nipple. Targeted ultrasound is performed, showing an irregular hypoechoic mass with associated vascularity at the 12 o'clock retroareolar position on the left. Overall measurements are 2.6 x 2.3 x 1.2 cm. This corresponds with the mammographic finding. Two adjacent circumscribed hypoechoic masses are identified in the deep 6 o'clock position 2 cm from the nipple. They measure 0.8 x 0.6 x 0.4 cm and 1.3 x 1.1 x 0.7 cm. There is no seated vascularity. This corresponds with the additional mammographic finding and likely represents  minimally complicated cyst. Evaluation of the left axilla demonstrates a markedly enlarged abnormal lymph node with complete hilar replacement. It measures up to 5 cm in long axis dimension. No suspicious mammographic findings on the right.   Accordingly on 04/06/2018 she proceeded to biopsy of the left breast area in question. The pathology from this procedure showed ([id number]): invasive ductal carcinoma, grade III, with lymphovascular invasion. Prognostic indicators significant for: estrogen receptor, 0% negative and progesterone receptor, 0% negative. Proliferation marker Ki67 at 70%. HER2 equivocal (2+) by immunohistochemistry, but negative by FISH (print3ed report pending).  On the same day she underwent a biopsy of the left axillary lymph node on 04/06/2018 showing ([id number]): metastatic carcinoma in 1 of 1 lymph node (1/1).   The patient's subsequent history is as detailed below.   INTERVAL HISTORY: Kathryn [name] returns today for follow-up and treatment of her triple negative breast cancer. She is accompanied by her husband.  She continues on neoadjuvant chemotherapy with Doxorubicin and Cyclophosphamide, on day 1 of a 14 day cycle with udenyca on day 3. Today is day 8 cycle 1. For the first three days following chemotherapy, she felt fine and was able to be active. She did have a mild headache on day 3. On day 4, however, she was unable to even keep water down. She said that when she took Compazine, it would make her extremely nauseous as well. She developed diarrhea on Day 7, which she describes as very soft. She feels very weak, but she says it may be due to dehydration. She has a dry cough that developed Day 7.   Since her last visit here, she has not undergone any additional studies.     REVIEW OF SYSTEMS: The patient denies unusual visual changes  or dizziness. There has been no unusual phlegm production, or pleurisy. This been no change in bladder habits. The patient denies unexplained  weight loss, bleeding, rash, or fever. A detailed review of systems was otherwise noncontributory.    PAST MEDICAL HISTORY: Past Medical History:  Diagnosis Date  . Breast cancer ([place])    stage 3 - left  . Family history of kidney cancer   . Family history of melanoma      PAST SURGICAL HISTORY: Past Surgical History:  Procedure Laterality Date  . CESAREAN SECTION     x3  . PORTACATH PLACEMENT N/A 04/28/2018   Procedure: INSERTION PORT-A-CATH WITH ULTRASOUND;  Surgeon: Erroll Luna, MD;  Location: [facility] OR;  Service: General;  Laterality: N/A;     FAMILY HISTORY: Family History  Problem Relation Age of Onset  . Kidney cancer [name] 69       d. 45  . Lung cancer Paternal Uncle   . Melanoma [name]   . Melanoma [name]    Kathryn [name] died from congestive heart failure at age 5. Patients' mother is 26 as of 03/2018. The patient has 1 brother and 3 sisters. Patient denies anyone in her family having breast, ovarian, prostate, or pancreatic cancer. Kathryn [name], Kathryn [name], was diagnosed with Kidney Cancer at 5. Kamri has an uncle and a cousin that were diagnosed with lung cancer, but they were both heavy smokers.    GYNECOLOGIC HISTORY:  No LMP recorded. Patient is postmenopausal. 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 Her husband, Kathryn [name], owns [employer]. Together, they have three children, Kathryn [name], Kathryn [name], and Kathryn [name]. 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 Kathryn [name] is 15, lives in [place], and is a Equities trader at [employer]. 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   ADVANCED DIRECTIVES: Kathryn [name]'s husband, Kathryn [name], is automatically her healthcare power of attorney.    HEALTH MAINTENANCE: Social History   Tobacco Use  . Smoking status: Never Smoker  . Smokeless tobacco: Never Used  Substance Use Topics  . Alcohol use: Yes    Comment: socially  . Drug use: Never    Colonoscopy: no  PAP: 2015  Bone density: no   No Known Allergies  Current Outpatient Medications  Medication Sig Dispense Refill  . ciprofloxacin (CIPRO) 500 MG tablet Take 1 tablet (500 mg total) by mouth 2 (two) times daily. 40 tablet 1  . dexamethasone (DECADRON) 4 MG tablet Take 2 tablets by mouth once a day on the day after chemotherapy and then take 2 tablets two times a day for 2 days. Take with food. 30 tablet 1  . ibuprofen (ADVIL,MOTRIN) 800 MG tablet Take 1 tablet (800 mg total) by mouth every 8 (eight) hours as needed. (Patient not taking: Reported on 05/03/2018) 30 tablet 0  . lidocaine-prilocaine (EMLA) cream Apply to affected area once 30 g 3  . loratadine (CLARITIN) 10 MG tablet Take 1 tablet (10 mg total) by mouth daily.    Marland Kitchen LORazepam (ATIVAN) 0.5 MG tablet Take 1 tablet (0.5 mg total) by mouth at bedtime as needed (Nausea or vomiting). 30 tablet 0  . omeprazole (PRILOSEC) 20 MG  capsule Take 1 capsule (20 mg total) by mouth daily as needed. 30 capsule 3  . ondansetron (ZOFRAN) 8 MG tablet Take 0.5-1 tablets (4-8 mg total) by mouth 3 (three) times daily as needed for nausea or vomiting. 20 tablet 0   No current facility-administered medications for this visit.      OBJECTIVE: Middle-aged white woman in no acute distress  Vitals:   05/10/18 1317  BP: 118/85  Pulse: 91  Resp: 18  Temp: 97.7 F (36.5 C)  SpO2: 100%     Body mass index is 26.18 kg/m.   Wt Readings from Last 3 Encounters:  05/10/18 153 lb 11.2 oz (69.7 kg)  05/03/18 158 lb 11.2 oz (72 kg)  04/28/18 158 lb 12.8 oz (72 kg)  ECOG FS:0 - Asymptomatic  Sclerae unicteric, pupils round and equal Oropharynx clear and moist No cervical  or supraclavicular adenopathy Lungs no rales or rhonchi Heart regular rate and rhythm Abd soft, nontender, positive bowel sounds MSK no focal spinal tenderness, no upper extremity lymphedema Neuro: nonfocal, well oriented, appropriate affect Breasts: The right breast is unremarkable.  The nipple and upper outer quadrant of the left breast continue to look abnormal as before and there is still palpable adenopathy in the left axilla.  LAB RESULTS:  CMP     Component Value Date/Time   NA 135 05/10/2018 1259   K 4.5 05/10/2018 1259   CL 103 05/10/2018 1259   CO2 22 05/10/2018 1259   GLUCOSE 91 05/10/2018 1259   BUN 13 05/10/2018 1259   CREATININE 0.67 05/10/2018 1259   CREATININE 0.83 04/13/2018 0843   CALCIUM 8.7 (L) 05/10/2018 1259   PROT 7.3 05/10/2018 1259   ALBUMIN 3.5 05/10/2018 1259   AST 14 (L) 05/10/2018 1259   AST 15 04/13/2018 0843   ALT 35 05/10/2018 1259   ALT 16 04/13/2018 0843   ALKPHOS 119 05/10/2018 1259   BILITOT 0.9 05/10/2018 1259   BILITOT 0.5 04/13/2018 0843   GFRNONAA >60 05/10/2018 1259   GFRNONAA >60 04/13/2018 0843   GFRAA >60 05/10/2018 1259   GFRAA >60 04/13/2018 0843    No results found for: TOTALPROTELP, ALBUMINELP, A1GS, A2GS, BETS, BETA2SER, GAMS, MSPIKE, SPEI  No results found for: KPAFRELGTCHN, LAMBDASER, KAPLAMBRATIO  Lab Results  Component Value Date   WBC 0.6 (LL) 05/10/2018   NEUTROABS PENDING 05/10/2018   HGB 13.1 05/10/2018   HCT 40.6 05/10/2018   MCV 91.9 05/10/2018   PLT 93 (L) 05/10/2018    _0 @  No results found for: LABCA2  No components found for: MGQQPY195  No results for input(s): INR in the last 168 hours.  No results found for: LABCA2  No results found for: KDT267  No results found for: TIW580  No results found for: DXI338  No results found for: CA2729  No components found for: HGQUANT  No results found for: CEA1 / No results found for: CEA1   No results found for: AFPTUMOR  No results  found for: CHROMOGRNA  No results found for: PSA1  Appointment on 05/10/2018  Component Date Value Ref Range Status  . Sodium 05/10/2018 135  135 - 145 mmol/L Final  . Potassium 05/10/2018 4.5  3.5 - 5.1 mmol/L Final  . Chloride 05/10/2018 103  98 - 111 mmol/L Final  . CO2 05/10/2018 22  22 - 32 mmol/L Final  . Glucose, Bld 05/10/2018 91  70 - 99 mg/dL Final  . BUN 05/10/2018 13  6 - 20 mg/dL Final  .  Creatinine, Ser 05/10/2018 0.67  0.44 - 1.00 mg/dL Final  . Calcium 05/10/2018 8.7* 8.9 - 10.3 mg/dL Final  . Total Protein 05/10/2018 7.3  6.5 - 8.1 g/dL Final  . Albumin 05/10/2018 3.5  3.5 - 5.0 g/dL Final  . AST 05/10/2018 14* 15 - 41 U/L Final  . ALT 05/10/2018 35  0 - 44 U/L Final  . Alkaline Phosphatase 05/10/2018 119  38 - 126 U/L Final  . Total Bilirubin 05/10/2018 0.9  0.3 - 1.2 mg/dL Final  . GFR calc non Af Amer 05/10/2018 >60  >60 mL/min Final  . GFR calc Af Amer 05/10/2018 >60  >60 mL/min Final  . Anion gap 05/10/2018 10  5 - 15 Final   Performed at [facility], [place] [address] [place], [place] [address]  . WBC 05/10/2018 0.6* 4.0 - 10.5 K/uL Final   This critical result has verified and been called to VAL DODD, RN by Modena Slater on 03 17 2020 at 1319, and has been read back.   Marland Kitchen RBC 05/10/2018 4.42  3.87 - 5.11 MIL/uL Final  . Hemoglobin 05/10/2018 13.1  12.0 - 15.0 g/dL Final  . HCT 05/10/2018 40.6  36.0 - 46.0 % Final  . MCV 05/10/2018 91.9  80.0 - 100.0 fL Final  . MCH 05/10/2018 29.6  26.0 - 34.0 pg Final  . MCHC 05/10/2018 32.3  30.0 - 36.0 g/dL Final  . RDW 05/10/2018 12.7  11.5 - 15.5 % Final  . Platelets 05/10/2018 93* 150 - 400 K/uL Final  . nRBC 05/10/2018 0.0  0.0 - 0.2 % Final   Performed at [facility], [place] [address], [place], [place] [address]  . Neutrophils Relative % 05/10/2018 PENDING  % Incomplete  . Neutro Abs 05/10/2018 PENDING  1.7 - 7.7 K/uL Incomplete  . Band Neutrophils 05/10/2018  PENDING  % Incomplete  . Lymphocytes Relative 05/10/2018 PENDING  % Incomplete  . Lymphs Abs 05/10/2018 PENDING  0.7 - 4.0 K/uL Incomplete  . Monocytes Relative 05/10/2018 PENDING  % Incomplete  . Monocytes Absolute 05/10/2018 PENDING  0.1 - 1.0 K/uL Incomplete  . Eosinophils Relative 05/10/2018 PENDING  % Incomplete  . Eosinophils Absolute 05/10/2018 PENDING  0.0 - 0.5 K/uL Incomplete  . Basophils Relative 05/10/2018 PENDING  % Incomplete  . Basophils Absolute 05/10/2018 PENDING  0.0 - 0.1 K/uL Incomplete  . WBC Morphology 05/10/2018 PENDING   Incomplete  . RBC Morphology 05/10/2018 PENDING   Incomplete  . Smear Review 05/10/2018 PENDING   Incomplete  . Other 05/10/2018 PENDING  % Incomplete  . nRBC 05/10/2018 PENDING  0 /100 WBC Incomplete  . Metamyelocytes Relative 05/10/2018 PENDING  % Incomplete  . Myelocytes 05/10/2018 PENDING  % Incomplete  . Promyelocytes Relative 05/10/2018 PENDING  % Incomplete  . Blasts 05/10/2018 PENDING  % Incomplete    (this displays the last labs from the last 3 days)  No results found for: TOTALPROTELP, ALBUMINELP, A1GS, A2GS, BETS, BETA2SER, GAMS, MSPIKE, SPEI (this displays SPEP labs)  No results found for: KPAFRELGTCHN, LAMBDASER, KAPLAMBRATIO (kappa/lambda light chains)  No results found for: HGBA, HGBA2QUANT, HGBFQUANT, HGBSQUAN (Hemoglobinopathy evaluation)   No results found for: LDH  No results found for: IRON, TIBC, IRONPCTSAT (Iron and TIBC)  No results found for: FERRITIN  Urinalysis No results found for: COLORURINE, APPEARANCEUR, LABSPEC, PHURINE, GLUCOSEU, HGBUR, BILIRUBINUR, KETONESUR, PROTEINUR, UROBILINOGEN, NITRITE, LEUKOCYTESUR   STUDIES:     ELIGIBLE FOR AVAILABLE RESEARCH PROTOCOL: [id number]   ASSESSMENT: 58  y.o. [place], [place] woman status post left breast upper outer quadrant biopsy 04/06/2018 for a clinical T2 N2, stage IIIC invasive ductal carcinoma, grade 3, triple negative, with an MIB-1 of 70%  (a) breast MRI  04/19/2018 shows a 5 cm central breast lesion with multiple satellite nodules and greater then 3 abnormal axillary lymph nodes  (b) baseline echocardiogram 04/25/2018 shows an ejection fraction in the 60-65% range  (c) chest CT scan and bone scan showed no metastatic disease.  Nonspecific rib changes will need to be followed  (1) neoadjuvant chemotherapy will consist of doxorubicin and cyclophosphamide in dose dense fashion x4, starting 05/03/2018, to be followed by weekly paclitaxel and carboplatin x12  (2) definitive surgery to follow  (3) adjuvant radiation after surgery  (4) genetics testing 04/27/2018   PLAN: Dona did moderately well with her first cycle of chemotherapy but there are some things that we can improve on.  Her ANC is quite low.  She will receive ciprofloxacin 500 mg twice daily for the next 5 days.  If she develops a fever she needs to call and in that case we would consider admission but also lower the dose of the next cycle by 20%.  I am hoping not to have to do that  She does not tolerate Compazine.  I have eliminated that and written for ondansetron instead.  She may take 4-8 mg 3 times a day as needed on days 2-3.  Of course she will continue the dexamethasone.  I have eliminated the palonosetron from intravenous premeds.  I am hoping with this change she will feel better and still have quite well controlled nausea.  For the burning she feels on days 4 and 5 she will take Prilosec.  She understands she will lose her hair in about a week.  She already has some hats with hair attached to them and is planning to get a wig.  She knows to call for any other issues that may develop before the next visit here.   Tamotsu Wiederholt, Virgie Dad, MD  05/10/18 1:52 PM Medical Oncology and Hematology [facility] [address] [place], [place] [address] Tel. [phone]    Fax. [phone]  I, Jacqualyn Posey am acting as a Education administrator for Chauncey Cruel, MD.   I,  Lurline Del MD, have reviewed the above documentation for accuracy and completeness, and I agree with the above.

## 2018-05-10 ENCOUNTER — Encounter: Payer: Self-pay | Admitting: Genetic Counselor

## 2018-05-10 ENCOUNTER — Other Ambulatory Visit: Payer: Self-pay

## 2018-05-10 ENCOUNTER — Inpatient Hospital Stay: Payer: BLUE CROSS/BLUE SHIELD

## 2018-05-10 ENCOUNTER — Telehealth: Payer: Self-pay | Admitting: Genetic Counselor

## 2018-05-10 ENCOUNTER — Inpatient Hospital Stay (HOSPITAL_BASED_OUTPATIENT_CLINIC_OR_DEPARTMENT_OTHER): Payer: BLUE CROSS/BLUE SHIELD | Admitting: Oncology

## 2018-05-10 VITALS — BP 118/85 | HR 91 | Temp 97.7°F | Resp 18 | Ht 64.25 in | Wt 153.7 lb

## 2018-05-10 DIAGNOSIS — R05 Cough: Secondary | ICD-10-CM

## 2018-05-10 DIAGNOSIS — Z801 Family history of malignant neoplasm of trachea, bronchus and lung: Secondary | ICD-10-CM

## 2018-05-10 DIAGNOSIS — Z5111 Encounter for antineoplastic chemotherapy: Secondary | ICD-10-CM | POA: Diagnosis not present

## 2018-05-10 DIAGNOSIS — C50412 Malignant neoplasm of upper-outer quadrant of left female breast: Secondary | ICD-10-CM | POA: Diagnosis not present

## 2018-05-10 DIAGNOSIS — R112 Nausea with vomiting, unspecified: Secondary | ICD-10-CM

## 2018-05-10 DIAGNOSIS — J3489 Other specified disorders of nose and nasal sinuses: Secondary | ICD-10-CM

## 2018-05-10 DIAGNOSIS — Z7689 Persons encountering health services in other specified circumstances: Secondary | ICD-10-CM | POA: Diagnosis not present

## 2018-05-10 DIAGNOSIS — Z79899 Other long term (current) drug therapy: Secondary | ICD-10-CM

## 2018-05-10 DIAGNOSIS — C773 Secondary and unspecified malignant neoplasm of axilla and upper limb lymph nodes: Secondary | ICD-10-CM

## 2018-05-10 DIAGNOSIS — Z8051 Family history of malignant neoplasm of kidney: Secondary | ICD-10-CM

## 2018-05-10 DIAGNOSIS — I700 Atherosclerosis of aorta: Secondary | ICD-10-CM

## 2018-05-10 DIAGNOSIS — Z1379 Encounter for other screening for genetic and chromosomal anomalies: Secondary | ICD-10-CM | POA: Insufficient documentation

## 2018-05-10 DIAGNOSIS — Z171 Estrogen receptor negative status [ER-]: Principal | ICD-10-CM

## 2018-05-10 DIAGNOSIS — Z95828 Presence of other vascular implants and grafts: Secondary | ICD-10-CM

## 2018-05-10 LAB — CBC WITH DIFFERENTIAL/PLATELET
ABS IMMATURE GRANULOCYTES: 0 10*3/uL (ref 0.00–0.07)
Band Neutrophils: 1 %
Basophils Absolute: 0 10*3/uL (ref 0.0–0.1)
Basophils Relative: 3 %
Eosinophils Absolute: 0.1 10*3/uL (ref 0.0–0.5)
Eosinophils Relative: 10 %
HCT: 40.6 % (ref 36.0–46.0)
HEMOGLOBIN: 13.1 g/dL (ref 12.0–15.0)
Lymphocytes Relative: 82 %
Lymphs Abs: 0.5 10*3/uL — ABNORMAL LOW (ref 0.7–4.0)
MCH: 29.6 pg (ref 26.0–34.0)
MCHC: 32.3 g/dL (ref 30.0–36.0)
MCV: 91.9 fL (ref 80.0–100.0)
Monocytes Absolute: 0 10*3/uL — ABNORMAL LOW (ref 0.1–1.0)
Monocytes Relative: 1 %
NEUTROS ABS: 0 10*3/uL — AB (ref 1.7–17.7)
Neutrophils Relative %: 3 %
Platelets: 93 10*3/uL — ABNORMAL LOW (ref 150–400)
RBC: 4.42 MIL/uL (ref 3.87–5.11)
RDW: 12.7 % (ref 11.5–15.5)
WBC: 0.6 10*3/uL — CL (ref 4.0–10.5)
nRBC: 0 % (ref 0.0–0.2)

## 2018-05-10 LAB — COMPREHENSIVE METABOLIC PANEL
ALT: 35 U/L (ref 0–44)
AST: 14 U/L — ABNORMAL LOW (ref 15–41)
Albumin: 3.5 g/dL (ref 3.5–5.0)
Alkaline Phosphatase: 119 U/L (ref 38–126)
Anion gap: 10 (ref 5–15)
BUN: 13 mg/dL (ref 6–20)
CO2: 22 mmol/L (ref 22–32)
Calcium: 8.7 mg/dL — ABNORMAL LOW (ref 8.9–10.3)
Chloride: 103 mmol/L (ref 98–111)
Creatinine, Ser: 0.67 mg/dL (ref 0.44–1.00)
GFR calc Af Amer: >60 mL/min (ref >60)
GFR calc non Af Amer: >60 mL/min (ref >60)
GLUCOSE: 91 mg/dL (ref 70–99)
Potassium: 4.5 mmol/L (ref 3.5–5.1)
Sodium: 135 mmol/L (ref 135–145)
Total Bilirubin: 0.9 mg/dL (ref 0.3–1.2)
Total Protein: 7.3 g/dL (ref 6.5–8.1)

## 2018-05-10 MED ORDER — SODIUM CHLORIDE 0.9% FLUSH
10.0000 mL | INTRAVENOUS | Status: DC | PRN
  Administered 2018-05-10: 10 mL via INTRAVENOUS
  Filled 2018-05-10: qty 10

## 2018-05-10 MED ORDER — HEPARIN SOD (PORK) LOCK FLUSH 100 UNIT/ML IV SOLN
500.0000 [IU] | Freq: Once | INTRAVENOUS | Status: AC
  Administered 2018-05-10: 500 [IU] via INTRAVENOUS
  Filled 2018-05-10: qty 5

## 2018-05-10 MED ORDER — OMEPRAZOLE 20 MG PO CPDR: 20.0000 mg | DELAYED_RELEASE_CAPSULE | Freq: Every day | ORAL | 3 refills | Status: DC | PRN

## 2018-05-10 MED ORDER — CIPROFLOXACIN HCL 500 MG PO TABS: 500.0000 mg | ORAL_TABLET | Freq: Two times a day (BID) | ORAL | 1 refills | Status: DC

## 2018-05-10 MED ORDER — ONDANSETRON HCL 8 MG PO TABS: 4.0000 mg | ORAL_TABLET | Freq: Three times a day (TID) | ORAL | 0 refills | Status: DC | PRN

## 2018-05-10 NOTE — Telephone Encounter (Signed)
She is waiting to see the Dr.  She will call back when finished.

## 2018-05-11 ENCOUNTER — Ambulatory Visit: Payer: Self-pay | Admitting: Genetic Counselor

## 2018-05-11 DIAGNOSIS — Z1379 Encounter for other screening for genetic and chromosomal anomalies: Secondary | ICD-10-CM

## 2018-05-11 NOTE — Telephone Encounter (Signed)
Revealed negative genetic testing.  Discussed that we do not know why she has breast cancer or why there is cancer in the family. It could be due to a different gene that we are not testing, or maybe our current technology may not be able to pick something up.  It will be important for her to keep in contact with genetics to keep up with whether additional testing may be needed.  We recommend that her sisters undergo genetic testing based on their diagnosis of Melanoma and the kidney cancer in the family.

## 2018-05-11 NOTE — Progress Notes (Signed)
HPI:  [name] was previously seen in the [facility] clinic due to a personal and family history of cancer and concerns regarding a hereditary predisposition to cancer. Please refer to our prior cancer genetics clinic note for more information regarding our discussion, assessment and recommendations, at the time. [name] recent genetic test results were disclosed to her, as were recommendations warranted by these results. These results and recommendations are discussed in more detail below.  CANCER HISTORY:    Malignant neoplasm of upper-outer quadrant of left breast in female, estrogen receptor negative ([place])   04/11/2018 Initial Diagnosis    Malignant neoplasm of upper-outer quadrant of left breast in female, estrogen receptor negative ([place])    05/03/2018 -  Chemotherapy    The patient had DOXOrubicin (ADRIAMYCIN) chemo injection 108 mg, 60 mg/m2 = 108 mg, Intravenous,  Once, 1 of 4 cycles Administration: 108 mg (05/03/2018) palonosetron (ALOXI) injection 0.25 mg, 0.25 mg, Intravenous,  Once, 1 of 1 cycle Administration: 0.25 mg (05/03/2018) pegfilgrastim-cbqv (UDENYCA) injection 6 mg, 6 mg, Subcutaneous, Once, 1 of 4 cycles Administration: 6 mg (05/05/2018) CARBOplatin (PARAPLATIN) in sodium chloride 0.9 % 100 mL chemo infusion, , Intravenous,  Once, 0 of 4 cycles cyclophosphamide (CYTOXAN) 1,080 mg in sodium chloride 0.9 % 250 mL chemo infusion, 600 mg/m2 = 1,080 mg, Intravenous,  Once, 1 of 4 cycles Administration: 1,080 mg (05/03/2018) PACLitaxel (TAXOL) 144 mg in sodium chloride 0.9 % 250 mL chemo infusion (</= 70m/m2), 80 mg/m2, Intravenous,  Once, 0 of 4 cycles fosaprepitant (EMEND) 150 mg, dexamethasone (DECADRON) 12 mg in sodium chloride 0.9 % 145 mL IVPB, , Intravenous,  Once, 1 of 8 cycles Administration:  (05/03/2018)  for chemotherapy treatment.      FAMILY HISTORY:  We obtained a detailed, 4-generation family history.  Significant diagnoses are listed  below: Family History  Problem Relation Age of Onset  . Kidney cancer Sister 33      d. 335 . Lung cancer Paternal Uncle   . Melanoma Sister   . Melanoma Sister     The patient has three daughters who are cancer free.  She has three sisters and a brother.  One sister had kidney cancer at 37and died at 359 and the two other sisters have had melanoma.  Her father is deceased and her mother is living.  The patient's mother is alive at 963  She is an only child.  Both of her parents died from non cancer related issues.  The patient's father is deceased at 871  He had a sister and two brothers.  One brother had lung cancer, as did his daughter - both were smokers.  The paternal grandparents are deceased from non cancer related issues.  [name] unaware of previous family history of genetic testing for hereditary cancer risks. Patient's maternal ancestors are of Caucasian descent, and paternal ancestors are of Caucasian descent. There is no reported Ashkenazi Jewish ancestry. There is no known consanguinity.  GENETIC TEST RESULTS: Genetic testing reported out on May 09, 2018 through the Multi-cancer panel found no pathogenic mutations.The Multi-Gene Panel offered by [facility] includes sequencing and/or deletion duplication testing of the following 85 genes: AIP, ALK, APC, ATM, AXIN2,BAP1,  BARD1, BLM, BMPR1A, BRCA1, BRCA2, BRIP1, CASR, CDC73, CDH1, CDK4, CDKN1B, CDKN1C, CDKN2A (p14ARF), CDKN2A (p16INK4a), CEBPA, CHEK2, CTNNA1, DICER1, DIS3L2, EGFR (c.2369C>T, p.Thr790Met variant only), EPCAM (Deletion/duplication testing only), FH, FLCN, GATA2, GPC3, GREM1 (Promoter region deletion/duplication testing only), HOXB13 (c.251G>A, p.Gly84Glu), HRAS, KIT,  MAX, MEN1, MET, MITF (c.952G>A, p.Glu318Lys variant only), MLH1, MSH2, MSH3, MSH6, MUTYH, NBN, NF1, NF2, NTHL1, PALB2, PDGFRA, PHOX2B, PMS2, POLD1, POLE, POT1, PRKAR1A, PTCH1, PTEN, RAD50, RAD51C, RAD51D, RB1, RECQL4, RET, RNF43, RUNX1, SDHAF2, SDHA  (sequence changes only), SDHB, SDHC, SDHD, SMAD4, SMARCA4, SMARCB1, SMARCE1, STK11, SUFU, TERC, TERT, TMEM127, TP53, TSC1, TSC2, VHL, WRN and WT1.  The test report has been scanned into EPIC and is located under the Molecular Pathology section of the Results Review tab.  A portion of the result report is included below for reference.     We discussed with [name] that because current genetic testing is not perfect, it is possible there may be a gene mutation in one of these genes that current testing cannot detect, but that chance is small.  We also discussed, that there could be another gene that has not yet been discovered, or that we have not yet tested, that is responsible for the cancer diagnoses in the family. It is also possible there is a hereditary cause for the cancer in the family that [name] did not inherit and therefore was not identified in her testing.  Therefore, it is important to remain in touch with cancer genetics in the future so that we can continue to offer [name] the most up to date genetic testing.   CANCER SCREENING RECOMMENDATIONS: [name] test result is considered negative (normal).  This means that we have not identified a hereditary cause for her personal and family history of cancer at this time. Most cancers happen by chance and this negative test suggests that her cancer may fall into this category.    While reassuring, this does not definitively rule out a hereditary predisposition to cancer. It is still possible that there could be genetic mutations that are undetectable by current technology. There could be genetic mutations in genes that have not been tested or identified to increase cancer risk.  Therefore, it is recommended she continue to follow the cancer management and screening guidelines provided by her oncology and primary healthcare provider.   An individual's cancer risk and medical management are not determined by genetic test results alone.  Overall cancer risk assessment incorporates additional factors, including personal medical history, family history, and any available genetic information that may result in a personalized plan for cancer prevention and surveillance  RECOMMENDATIONS FOR FAMILY MEMBERS:  Individuals in this family might be at some increased risk of developing cancer, over the general population risk, simply due to the family history of cancer.  We recommended women in this family have a yearly mammogram beginning at age 81, or 9 years younger than the earliest onset of cancer, an annual clinical breast exam, and perform monthly breast self-exams. Women in this family should also have a gynecological exam as recommended by their primary provider. All family members should have a colonoscopy by age 45.  It is also possible there is a hereditary cause for the cancer in [name]'s family that she did not inherit and therefore was not identified in her.  Based on [name]'s family history, we recommended her sisters, who was diagnosed with melanoma and have a sister who passed away at 62 from kidney cancer, have genetic counseling and testing. [name] will let us know if we can be of any assistance in coordinating genetic counseling and/or testing for this family member.   FOLLOW-UP: Lastly, we discussed with [name] that cancer genetics is a rapidly advancing field and it is  possible that new genetic tests will be appropriate for her and/or her family members in the future. We encouraged her to remain in contact with cancer genetics on an annual basis so we can update her personal and family histories and let her know of advances in cancer genetics that may benefit this family.   Our contact number was provided. [name] questions were answered to her satisfaction, and she knows she is welcome to call us at anytime with additional questions or concerns.   Roma Kayser, MS, [facility] Certified Genetic Counselor  [email or web address]

## 2018-05-16 ENCOUNTER — Encounter: Payer: BLUE CROSS/BLUE SHIELD | Admitting: Licensed Clinical Social Worker

## 2018-05-17 ENCOUNTER — Inpatient Hospital Stay: Payer: BLUE CROSS/BLUE SHIELD

## 2018-05-17 ENCOUNTER — Encounter: Payer: Self-pay | Admitting: *Deleted

## 2018-05-17 ENCOUNTER — Other Ambulatory Visit: Payer: Self-pay

## 2018-05-17 ENCOUNTER — Inpatient Hospital Stay (HOSPITAL_BASED_OUTPATIENT_CLINIC_OR_DEPARTMENT_OTHER): Payer: BLUE CROSS/BLUE SHIELD | Admitting: Adult Health

## 2018-05-17 ENCOUNTER — Encounter: Payer: Self-pay | Admitting: Adult Health

## 2018-05-17 ENCOUNTER — Ambulatory Visit: Payer: BLUE CROSS/BLUE SHIELD

## 2018-05-17 VITALS — BP 130/71 | HR 89 | Temp 97.6°F | Resp 18 | Ht 64.25 in | Wt 157.1 lb

## 2018-05-17 DIAGNOSIS — C50412 Malignant neoplasm of upper-outer quadrant of left female breast: Secondary | ICD-10-CM

## 2018-05-17 DIAGNOSIS — Z5111 Encounter for antineoplastic chemotherapy: Secondary | ICD-10-CM | POA: Diagnosis not present

## 2018-05-17 DIAGNOSIS — Z171 Estrogen receptor negative status [ER-]: Principal | ICD-10-CM

## 2018-05-17 DIAGNOSIS — R112 Nausea with vomiting, unspecified: Secondary | ICD-10-CM

## 2018-05-17 DIAGNOSIS — Z801 Family history of malignant neoplasm of trachea, bronchus and lung: Secondary | ICD-10-CM

## 2018-05-17 DIAGNOSIS — R05 Cough: Secondary | ICD-10-CM

## 2018-05-17 DIAGNOSIS — Z7689 Persons encountering health services in other specified circumstances: Secondary | ICD-10-CM | POA: Diagnosis not present

## 2018-05-17 DIAGNOSIS — C773 Secondary and unspecified malignant neoplasm of axilla and upper limb lymph nodes: Secondary | ICD-10-CM | POA: Diagnosis not present

## 2018-05-17 DIAGNOSIS — Z79899 Other long term (current) drug therapy: Secondary | ICD-10-CM

## 2018-05-17 DIAGNOSIS — Z95828 Presence of other vascular implants and grafts: Secondary | ICD-10-CM

## 2018-05-17 DIAGNOSIS — D709 Neutropenia, unspecified: Secondary | ICD-10-CM

## 2018-05-17 DIAGNOSIS — I700 Atherosclerosis of aorta: Secondary | ICD-10-CM

## 2018-05-17 DIAGNOSIS — J3489 Other specified disorders of nose and nasal sinuses: Secondary | ICD-10-CM

## 2018-05-17 DIAGNOSIS — Z8051 Family history of malignant neoplasm of kidney: Secondary | ICD-10-CM

## 2018-05-17 LAB — CBC WITH DIFFERENTIAL/PLATELET
Abs Immature Granulocytes: 0.52 10*3/uL — ABNORMAL HIGH (ref 0.00–0.07)
Basophils Absolute: 0.1 10*3/uL (ref 0.0–0.1)
Basophils Relative: 1 %
Eosinophils Absolute: 0 10*3/uL (ref 0.0–0.5)
Eosinophils Relative: 0 %
HCT: 36.3 % (ref 36.0–46.0)
Hemoglobin: 11.7 g/dL — ABNORMAL LOW (ref 12.0–15.0)
Immature Granulocytes: 9 %
Lymphocytes Relative: 20 %
Lymphs Abs: 1.2 10*3/uL (ref 0.7–4.0)
MCH: 29.8 pg (ref 26.0–34.0)
MCHC: 32.2 g/dL (ref 30.0–36.0)
MCV: 92.4 fL (ref 80.0–100.0)
Monocytes Absolute: 0.7 10*3/uL (ref 0.1–1.0)
Monocytes Relative: 13 %
NEUTROS PCT: 57 %
Neutro Abs: 3.3 10*3/uL (ref 1.7–7.7)
Platelets: 256 10*3/uL (ref 150–400)
RBC: 3.93 MIL/uL (ref 3.87–5.11)
RDW: 12.4 % (ref 11.5–15.5)
WBC: 5.8 10*3/uL (ref 4.0–10.5)
nRBC: 0.3 % — ABNORMAL HIGH (ref 0.0–0.2)

## 2018-05-17 LAB — COMPREHENSIVE METABOLIC PANEL
ALT: 29 U/L (ref 0–44)
ANION GAP: 11 (ref 5–15)
AST: 19 U/L (ref 15–41)
Albumin: 3.3 g/dL — ABNORMAL LOW (ref 3.5–5.0)
Alkaline Phosphatase: 105 U/L (ref 38–126)
BUN: 10 mg/dL (ref 6–20)
CO2: 22 mmol/L (ref 22–32)
Calcium: 8.5 mg/dL — ABNORMAL LOW (ref 8.9–10.3)
Chloride: 107 mmol/L (ref 98–111)
Creatinine, Ser: 0.67 mg/dL (ref 0.44–1.00)
GFR calc Af Amer: >60 mL/min (ref >60)
GFR calc non Af Amer: >60 mL/min (ref >60)
Glucose, Bld: 105 mg/dL — ABNORMAL HIGH (ref 70–99)
POTASSIUM: 4 mmol/L (ref 3.5–5.1)
Sodium: 140 mmol/L (ref 135–145)
Total Bilirubin: <0.2 mg/dL — ABNORMAL LOW (ref 0.3–1.2)
Total Protein: 6.6 g/dL (ref 6.5–8.1)

## 2018-05-17 MED ORDER — HEPARIN SOD (PORK) LOCK FLUSH 100 UNIT/ML IV SOLN
500.0000 [IU] | Freq: Once | INTRAVENOUS | Status: AC | PRN
  Administered 2018-05-17: 500 [IU]
  Filled 2018-05-17: qty 5

## 2018-05-17 MED ORDER — SODIUM CHLORIDE 0.9 % IV SOLN
Freq: Once | INTRAVENOUS | Status: AC
  Administered 2018-05-17: 11:00:00 via INTRAVENOUS
  Filled 2018-05-17: qty 5

## 2018-05-17 MED ORDER — DOXORUBICIN HCL CHEMO IV INJECTION 2 MG/ML
60.0000 mg/m2 | Freq: Once | INTRAVENOUS | Status: AC
  Administered 2018-05-17: 108 mg via INTRAVENOUS
  Filled 2018-05-17: qty 54

## 2018-05-17 MED ORDER — SODIUM CHLORIDE 0.9 % IV SOLN
Freq: Once | INTRAVENOUS | Status: AC
  Administered 2018-05-17: 10:00:00 via INTRAVENOUS
  Filled 2018-05-17: qty 250

## 2018-05-17 MED ORDER — SODIUM CHLORIDE 0.9% FLUSH
10.0000 mL | INTRAVENOUS | Status: DC | PRN
  Administered 2018-05-17: 10 mL
  Filled 2018-05-17: qty 10

## 2018-05-17 MED ORDER — SODIUM CHLORIDE 0.9% FLUSH
10.0000 mL | Freq: Once | INTRAVENOUS | Status: AC
  Administered 2018-05-17: 10 mL
  Filled 2018-05-17: qty 10

## 2018-05-17 MED ORDER — SODIUM CHLORIDE 0.9 % IV SOLN
600.0000 mg/m2 | Freq: Once | INTRAVENOUS | Status: AC
  Administered 2018-05-17: 1080 mg via INTRAVENOUS
  Filled 2018-05-17: qty 54

## 2018-05-17 NOTE — Patient Instructions (Signed)
[facility] Discharge Instructions for Patients Receiving Chemotherapy  Today you received the following chemotherapy agents Doxorubicin (ADRIAMYCIN) & Cyclophosphamide (CYTOXAN).  To help prevent nausea and vomiting after your treatment, we encourage you to take your nausea medication as prescribed.   If you develop nausea and vomiting that is not controlled by your nausea medication, call the clinic.   BELOW ARE SYMPTOMS THAT SHOULD BE REPORTED IMMEDIATELY:  *FEVER GREATER THAN 100.5 F  *CHILLS WITH OR WITHOUT FEVER  NAUSEA AND VOMITING THAT IS NOT CONTROLLED WITH YOUR NAUSEA MEDICATION  *UNUSUAL SHORTNESS OF BREATH  *UNUSUAL BRUISING OR BLEEDING  TENDERNESS IN MOUTH AND THROAT WITH OR WITHOUT PRESENCE OF ULCERS  *URINARY PROBLEMS  *BOWEL PROBLEMS  UNUSUAL RASH Items with * indicate a potential emergency and should be followed up as soon as possible.  Feel free to call the clinic should you have any questions or concerns. The clinic phone number is [phone].  Please show the CHEMO ALERT CARD at check-in to the Emergency Department and triage nurse.  Coronavirus (COVID-19) Are you at risk?  Are you at risk for the Coronavirus (COVID-19)?  To be considered HIGH RISK for Coronavirus (COVID-19), you have to meet the following criteria:  . Traveled to [place], [place], [place], [place] or [place]; or in the [place] to [place], [place], [place], or [place]; and have fever, cough, and shortness of breath within the last 2 weeks of travel OR . Been in close contact with a person diagnosed with COVID-19 within the last 2 weeks and have fever, cough, and shortness of breath . IF YOU DO NOT MEET THESE CRITERIA, YOU ARE CONSIDERED LOW RISK FOR COVID-19.  What to do if you are HIGH RISK for COVID-19?  . If you are having a medical emergency, call 911. . Seek medical care right away. Before you go to a doctor's office, urgent care or emergency  department, call ahead and tell them about your recent travel, contact with someone diagnosed with COVID-19, and your symptoms. You should receive instructions from your physician's office regarding next steps of care.  . When you arrive at healthcare provider, tell the healthcare staff immediately you have returned from visiting [place], [place], [place], [place] or [place]; or traveled in the [place] to [place], [place], [place], or [place]; in the last two weeks or you have been in close contact with a person diagnosed with COVID-19 in the last 2 weeks.   . Tell the health care staff about your symptoms: fever, cough and shortness of breath. . After you have been seen by a medical provider, you will be either: o Tested for (COVID-19) and discharged home on quarantine except to seek medical care if symptoms worsen, and asked to  - Stay home and avoid contact with others until you get your results (4-5 days)  - Avoid travel on public transportation if possible (such as bus, train, or airplane) or o Sent to the Emergency Department by EMS for evaluation, COVID-19 testing, and possible admission depending on your condition and test results.  What to do if you are LOW RISK for COVID-19?  Reduce your risk of any infection by using the same precautions used for avoiding the common cold or flu:  . Wash your hands often with soap and warm water for at least 20 seconds.  If soap and water are not readily available, use an alcohol-based hand sanitizer with at least 60% alcohol.  .   If coughing or sneezing, cover your mouth and nose by coughing or sneezing into the elbow areas of your shirt or coat, into a tissue or into your sleeve (not your hands). . Avoid shaking hands with others and consider head nods or verbal greetings only. . Avoid touching your eyes, nose, or mouth with unwashed hands.  . Avoid close contact with people who are sick. . Avoid places or events with large numbers of people  in one location, like concerts or sporting events. . Carefully consider travel plans you have or are making. . If you are planning any travel outside or inside the [place], visit the CDC's Travelers' Health webpage for the latest health notices. . If you have some symptoms but not all symptoms, continue to monitor at home and seek medical attention if your symptoms worsen. . If you are having a medical emergency, call 911.   [place] / e-Visit: [email or web address]         [facility]: [facility]: [phone]                   [facility]: [phone]

## 2018-05-17 NOTE — Research (Signed)
DCP-001 USE OF A CLINICAL TRIAL SCREENING TOOL TO ADDRESS CANCER HEALTH DISPARITIES IN THE [employer] COMMUNITY ONCOLOGY RESEARCH PROGRAM (NCORP). Patient declined to enroll on the [employer] study and in clinic today for her chemotherapy treatment. Met with patient in infusion room and asked her if she was still interested in participating in theDCP study to which she answered "yes." Informed patient her participation in this study is completelyvoluntary. Reminded patient that this study involvesa one time consent and collection of demographic variables with the majority ofdata collected fromhermedical record. No patient identifiers are being reported via the screening tool. Reviewed the consent form for protocol version date2/21/19 and HIPPA form dated 04/16/14 in their entirety with patient, includingthe purpose of the study, potential risksand benefits. Patient verbalized understanding, denied any questions and agreed to participate. She signed and dated the above forms. Research nurse then spent 5 minutes asking patient the demographic questions for the study. Copy of signed consent/hippa forms given topatient for her records.  Thanked patient for her time today and participation in this study. Patient meets eligibility and will be enrolled on this study. Original signed consent and HIPPA forms along with completed worksheet placed in basket for research specialist, Remer Macho, to register patient to study.  Foye Spurling, BSN, RN Clinical Research Nurse 05/17/2018 12:00 PM

## 2018-05-17 NOTE — Progress Notes (Signed)
Kathryn [name]  [phone] [phone]    ID: Kathryn [name] DOB: 11-14-1960  MR#: [id number]  BDZ#:[id number]  Patient Care Team: Kathryn Blitz, MD as PCP - General (Obstetrics and Gynecology) Kathryn Germany, RN as Oncology Nurse Navigator Kathryn Kaufmann, RN as Oncology Nurse Navigator Kathryn Luna, MD as Consulting Physician (General Surgery) [name], Kathryn Dad, MD as Consulting Physician (Oncology) Kathryn Rudd, MD as Consulting Physician (Radiation Oncology) OTHER MD: Kathryn [name] (OBGYN)   CHIEF COMPLAINT: Triple negative breast cancer  CURRENT TREATMENT: Neoadjuvant chemotherapy   HISTORY OF CURRENT ILLNESS: From the original intake note:  Kathryn [name] presented with a palpable subareolar left breast lump, nipple retraction, and discharge for approximately 1 week. She underwent bilateral diagnostic mammography with tomography and left breast ultrasonography at [facility] on 04/01/2018 showing: Breast Density Category C. An irregular hyperdense mass is seen in the left subareolar region. An additional oval, circumscribed mass is seen in the inferior central aspect at posterior depth. No additional suspicious findings are identified within the remainder of the left breast. On physical exam, there is a 2-3 cm firm, fixed lump in the subareolar region on the left. Subtle skin changes and retraction is noted along the left nipple. Targeted ultrasound is performed, showing an irregular hypoechoic mass with associated vascularity at the 12 o'clock retroareolar position on the left. Overall measurements are 2.6 x 2.3 x 1.2 cm. This corresponds with the mammographic finding. Two adjacent circumscribed hypoechoic masses are identified in the deep 6 o'clock position 2 cm from the nipple. They measure 0.8 x 0.6 x 0.4 cm and 1.3 x 1.1 x 0.7 cm. There is no seated vascularity. This corresponds with the additional mammographic finding and likely represents  minimally complicated cyst. Evaluation of the left axilla demonstrates a markedly enlarged abnormal lymph node with complete hilar replacement. It measures up to 5 cm in long axis dimension. No suspicious mammographic findings on the right.   Accordingly on 04/06/2018 she proceeded to biopsy of the left breast area in question. The pathology from this procedure showed ([id number]): invasive ductal carcinoma, grade III, with lymphovascular invasion. Prognostic indicators significant for: estrogen receptor, 0% negative and progesterone receptor, 0% negative. Proliferation marker Ki67 at 70%. HER2 equivocal (2+) by immunohistochemistry, but negative by FISH (print3ed report pending).  On the same day she underwent a biopsy of the left axillary lymph node on 04/06/2018 showing ([id number]): metastatic carcinoma in 1 of 1 lymph node (1/1).   The patient's subsequent history is as detailed below.   INTERVAL HISTORY: Kathryn [name] returns today for follow-up and treatment of her triple negative breast cancer. She is unaccompanied.    She continues on neoadjuvant chemotherapy with Doxorubicin and Cyclophosphamide, on day 1 of a 14 day cycle with udenyca on day 3. Today is day 1 cycle 2.  REVIEW OF SYSTEMS: She had a difficult time with nausea and subsequent dehydration following cycle 1 of her treatment.  She notes the compazine didn't work and her anti emetics were adjusted by Dr. Jana [name].  She also received IV fluids.  She is feeling much better now that all of that was operationalized.    Kathryn [name] is doing well today.  She denies any new issues today such as fever, chills, cough, shortness of breath, nausea or vomiting.  She denies headaches, bowel/bladder changes.  She notes she is feeling well and is ready to proceed with treatment.  She does want to know what days to take Cipro on, as  Dr. Jana [name] had instructed her to take it daily for 5 days following chemotherapy.  Otherwise, a detailed ROS was non  contributory.     PAST MEDICAL HISTORY: Past Medical History:  Diagnosis Date   Breast cancer ([name])    stage 3 - left   Family history of kidney cancer    Family history of melanoma      PAST SURGICAL HISTORY: Past Surgical History:  Procedure Laterality Date   CESAREAN SECTION     x3   PORTACATH PLACEMENT N/A 04/28/2018   Procedure: INSERTION PORT-A-CATH WITH ULTRASOUND;  Surgeon: Kathryn Luna, MD;  Location: [facility] OR;  Service: General;  Laterality: N/A;     FAMILY HISTORY: Family History  Problem Relation Age of Onset   Kidney cancer Sister 64       d. 46   Lung cancer Paternal Uncle    Melanoma Sister    Melanoma Sister    Kathryn [name]'s father died from congestive heart failure at age 67. Patients' mother is 51 as of 03/2018. The patient has 1 brother and 3 sisters. Patient denies anyone in her family having breast, ovarian, prostate, or pancreatic cancer. Kathryn [name]'s sister, Kathryn [name], was diagnosed with Kidney Cancer at 33. Kathryn [name] has an uncle and a cousin that were diagnosed with lung cancer, but they were both heavy smokers.    GYNECOLOGIC HISTORY:  No LMP recorded. Patient is postmenopausal. 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 Her husband, Kathryn [name], owns [employer]. Together, they have three children, Kathryn [name], Kathryn [name], and Kathryn [name]. 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 Kathryn [name] is 76, lives in [place], and is a Equities trader at [employer]. 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   ADVANCED DIRECTIVES: Kathryn [name]'s husband, Kathryn [name], is automatically her healthcare power of attorney.    HEALTH  MAINTENANCE: Social History   Tobacco Use   Smoking status: Never Smoker   Smokeless tobacco: Never Used  Substance Use Topics   Alcohol use: Yes    Comment: socially   Drug use: Never    Colonoscopy: no  PAP: 2015  Bone density: no   No Known Allergies  Current Outpatient Medications  Medication Sig Dispense Refill   ciprofloxacin (CIPRO) 500 MG tablet Take 1 tablet (500 mg total) by mouth 2 (two) times daily. 40 tablet 1   dexamethasone (DECADRON) 4 MG tablet Take 2 tablets by mouth once a day on the day after chemotherapy and then take 2 tablets two times a day for 2 days. Take with food. 30 tablet 1   ibuprofen (ADVIL,MOTRIN) 800 MG tablet Take 1 tablet (800 mg total) by mouth every 8 (eight) hours as needed. 30 tablet 0   lidocaine-prilocaine (EMLA) cream Apply to affected area once 30 g 3   loratadine (CLARITIN) 10 MG tablet Take 1 tablet (10 mg total) by mouth daily.     LORazepam (ATIVAN) 0.5 MG tablet Take 1 tablet (0.5 mg total) by mouth at bedtime as needed (Nausea or vomiting). 30 tablet 0   magic mouthwash w/lidocaine SOLN swish, gargle & spit 5-10 MILLILITER EVERY 6 HOURS AS NEEDED     omeprazole (PRILOSEC) 20 MG  capsule Take 1 capsule (20 mg total) by mouth daily as needed. 30 capsule 3   ondansetron (ZOFRAN) 8 MG tablet Take 0.5-1 tablets (4-8 mg total) by mouth 3 (three) times daily as needed for nausea or vomiting. 20 tablet 0   No current facility-administered medications for this visit.      OBJECTIVE:  Vitals:   05/17/18 0922  BP: 130/71  Pulse: 89  Resp: 18  Temp: 97.6 F (36.4 C)  SpO2: 100%     Body mass index is 26.76 kg/m.   Wt Readings from Last 3 Encounters:  05/17/18 157 lb 1.6 oz (71.3 kg)  05/10/18 153 lb 11.2 oz (69.7 kg)  05/03/18 158 lb 11.2 oz (72 kg)  ECOG FS:1  GENERAL: Patient is a well appearing female in no acute distress HEENT:  Sclerae anicteric.  Oropharynx clear and moist. No ulcerations or evidence of  oropharyngeal candidiasis. Neck is supple.  NODES:  No cervical, supraclavicular, or axillary lymphadenopathy palpated.  BREAST EXAM: left breast with palpable tumor, and fixed nipple, tumor softer per patient. LUNGS:  Clear to auscultation bilaterally.  No wheezes or rhonchi. HEART:  Regular rate and rhythm. No murmur appreciated. ABDOMEN:  Soft, nontender.  Positive, normoactive bowel sounds. No organomegaly palpated. MSK:  No focal spinal tenderness to palpation. Full range of motion bilaterally in the upper extremities. EXTREMITIES:  No peripheral edema.   SKIN:  Clear with no obvious rashes or skin changes. No nail dyscrasia. NEURO:  Nonfocal. Well oriented.  Appropriate affect.    LAB RESULTS:  CMP     Component Value Date/Time   NA 135 05/10/2018 1259   K 4.5 05/10/2018 1259   CL 103 05/10/2018 1259   CO2 22 05/10/2018 1259   GLUCOSE 91 05/10/2018 1259   BUN 13 05/10/2018 1259   CREATININE 0.67 05/10/2018 1259   CREATININE 0.83 04/13/2018 0843   CALCIUM 8.7 (L) 05/10/2018 1259   PROT 7.3 05/10/2018 1259   ALBUMIN 3.5 05/10/2018 1259   AST 14 (L) 05/10/2018 1259   AST 15 04/13/2018 0843   ALT 35 05/10/2018 1259   ALT 16 04/13/2018 0843   ALKPHOS 119 05/10/2018 1259   BILITOT 0.9 05/10/2018 1259   BILITOT 0.5 04/13/2018 0843   GFRNONAA >60 05/10/2018 1259   GFRNONAA >60 04/13/2018 0843   GFRAA >60 05/10/2018 1259   GFRAA >60 04/13/2018 0843    No results found for: TOTALPROTELP, ALBUMINELP, A1GS, A2GS, BETS, BETA2SER, GAMS, MSPIKE, SPEI  No results found for: KPAFRELGTCHN, LAMBDASER, KAPLAMBRATIO  Lab Results  Component Value Date   WBC 5.8 05/17/2018   NEUTROABS PENDING 05/17/2018   HGB 11.7 (L) 05/17/2018   HCT 36.3 05/17/2018   MCV 92.4 05/17/2018   PLT 256 05/17/2018    _0 @  No results found for: LABCA2  No components found for: TDVVOH607  No results for input(s): INR in the last 168 hours.  No results found for: LABCA2  No  results found for: PXT062  No results found for: IRS854  No results found for: OEV035  No results found for: CA2729  No components found for: HGQUANT  No results found for: CEA1 / No results found for: CEA1   No results found for: AFPTUMOR  No results found for: CHROMOGRNA  No results found for: PSA1  Appointment on 05/17/2018  Component Date Value Ref Range Status   WBC 05/17/2018 5.8  4.0 - 10.5 K/uL Final   RBC 05/17/2018 3.93  3.87 - 5.11 MIL/uL Final  Hemoglobin 05/17/2018 11.7* 12.0 - 15.0 g/dL Final   HCT 05/17/2018 36.3  36.0 - 46.0 % Final   MCV 05/17/2018 92.4  80.0 - 100.0 fL Final   MCH 05/17/2018 29.8  26.0 - 34.0 pg Final   MCHC 05/17/2018 32.2  30.0 - 36.0 g/dL Final   RDW 05/17/2018 12.4  11.5 - 15.5 % Final   Platelets 05/17/2018 256  150 - 400 K/uL Final   nRBC 05/17/2018 0.3* 0.0 - 0.2 % Final   Performed at [facility], [place] [address], [place], [place] [address]   Neutrophils Relative % 05/17/2018 PENDING  % Incomplete   Neutro Abs 05/17/2018 PENDING  1.7 - 7.7 K/uL Incomplete   Band Neutrophils 05/17/2018 PENDING  % Incomplete   Lymphocytes Relative 05/17/2018 PENDING  % Incomplete   Lymphs Abs 05/17/2018 PENDING  0.7 - 4.0 K/uL Incomplete   Monocytes Relative 05/17/2018 PENDING  % Incomplete   Monocytes Absolute 05/17/2018 PENDING  0.1 - 1.0 K/uL Incomplete   Eosinophils Relative 05/17/2018 PENDING  % Incomplete   Eosinophils Absolute 05/17/2018 PENDING  0.0 - 0.5 K/uL Incomplete   Basophils Relative 05/17/2018 PENDING  % Incomplete   Basophils Absolute 05/17/2018 PENDING  0.0 - 0.1 K/uL Incomplete   WBC Morphology 05/17/2018 PENDING   Incomplete   RBC Morphology 05/17/2018 PENDING   Incomplete   Smear Review 05/17/2018 PENDING   Incomplete   Other 05/17/2018 PENDING  % Incomplete   nRBC 05/17/2018 PENDING  0 /100 WBC Incomplete   Metamyelocytes Relative 05/17/2018 PENDING  % Incomplete    Myelocytes 05/17/2018 PENDING  % Incomplete   Promyelocytes Relative 05/17/2018 PENDING  % Incomplete   Blasts 05/17/2018 PENDING  % Incomplete    (this displays the last labs from the last 3 days)  No results found for: TOTALPROTELP, ALBUMINELP, A1GS, A2GS, BETS, BETA2SER, GAMS, MSPIKE, SPEI (this displays SPEP labs)  No results found for: KPAFRELGTCHN, LAMBDASER, KAPLAMBRATIO (kappa/lambda light chains)  No results found for: HGBA, HGBA2QUANT, HGBFQUANT, HGBSQUAN (Hemoglobinopathy evaluation)   No results found for: LDH  No results found for: IRON, TIBC, IRONPCTSAT (Iron and TIBC)  No results found for: FERRITIN  Urinalysis No results found for: COLORURINE, APPEARANCEUR, LABSPEC, PHURINE, GLUCOSEU, HGBUR, BILIRUBINUR, KETONESUR, PROTEINUR, UROBILINOGEN, NITRITE, LEUKOCYTESUR   STUDIES:     ELIGIBLE FOR AVAILABLE RESEARCH PROTOCOL: [id number]   ASSESSMENT: 58 y.o. [place], [place] woman status post left breast upper outer quadrant biopsy 04/06/2018 for a clinical T2 N2, stage IIIC invasive ductal carcinoma, grade 3, triple negative, with an MIB-1 of 70%  (a) breast MRI 04/19/2018 shows a 5 cm central breast lesion with multiple satellite nodules and greater then 3 abnormal axillary lymph nodes  (b) baseline echocardiogram 04/25/2018 shows an ejection fraction in the 60-65% range  (c) chest CT scan and bone scan showed no metastatic disease.  Nonspecific rib changes will need to be followed  (1) neoadjuvant chemotherapy will consist of doxorubicin and cyclophosphamide in dose dense fashion x4, starting 05/03/2018, to be followed by weekly paclitaxel and carboplatin x12  (2) definitive surgery to follow  (3) adjuvant radiation after surgery  (4) genetics testing 04/27/2018   PLAN: Kathryn [name] is doing well today.  Her labs are stable and she will proceed with her second cycle of chemotherapy with Doxorubicin and Cyclophosphamide today.  She will return in two days for [facility].     Yaileen had issues with neutropenia and Dr. Jana [name] sent in 40 tablets of Cipro to cover her for 5  days following her chemotherapy.  I recommended she start the Cipro 566m PO BID on Saturday, 05/21/2018.  She tolerated the cipro well.    Aliceson has IV fluids scheduled for two days following her treatments.  I have placed fluid orders for such in the supportive care area of the treatment plan.  She knows that if she is eating and drinking well, and feeling well she can call and cancel those appointments.    We will see her back in one week for labs and f/u.  She knows to call for any other issues that may develop before the next visit here.  A total of (30) minutes of face-to-face time was spent with this patient with greater than 50% of that time in counseling and care-coordination.  LWilber Bihari NP  05/17/18 9:50 AM Medical Oncology and Hematology [facility]ADamascus [place] [id number]. [phone]   Fax. [phone]

## 2018-05-19 ENCOUNTER — Other Ambulatory Visit: Payer: Self-pay

## 2018-05-19 ENCOUNTER — Inpatient Hospital Stay: Payer: BLUE CROSS/BLUE SHIELD

## 2018-05-19 DIAGNOSIS — Z171 Estrogen receptor negative status [ER-]: Principal | ICD-10-CM

## 2018-05-19 DIAGNOSIS — C50412 Malignant neoplasm of upper-outer quadrant of left female breast: Secondary | ICD-10-CM | POA: Diagnosis not present

## 2018-05-19 MED ORDER — PEGFILGRASTIM-CBQV 6 MG/0.6ML ~~LOC~~ SOSY
PREFILLED_SYRINGE | SUBCUTANEOUS | Status: AC
  Filled 2018-05-19: qty 0.6

## 2018-05-19 MED ORDER — PEGFILGRASTIM-CBQV 6 MG/0.6ML ~~LOC~~ SOSY
6.0000 mg | PREFILLED_SYRINGE | Freq: Once | SUBCUTANEOUS | Status: AC
  Administered 2018-05-19: 6 mg via SUBCUTANEOUS

## 2018-05-19 NOTE — Patient Instructions (Signed)
Pegfilgrastim injection  What is this medicine?  PEGFILGRASTIM (PEG fil gra stim) is a long-acting granulocyte colony-stimulating factor that stimulates the growth of neutrophils, a type of white blood cell important in the body's fight against infection. It is used to reduce the incidence of fever and infection in patients with certain types of cancer who are receiving chemotherapy that affects the bone marrow, and to increase survival after being exposed to high doses of radiation.  This medicine may be used for other purposes; ask your health care provider or pharmacist if you have questions.  COMMON BRAND NAME(S): Fulphila, Neulasta, UDENYCA  What should I tell my health care provider before I take this medicine?  They need to know if you have any of these conditions:  -kidney disease  -latex allergy  -ongoing radiation therapy  -sickle cell disease  -skin reactions to acrylic adhesives (On-Body Injector only)  -an unusual or allergic reaction to pegfilgrastim, filgrastim, other medicines, foods, dyes, or preservatives  -pregnant or trying to get pregnant  -breast-feeding  How should I use this medicine?  This medicine is for injection under the skin. If you get this medicine at home, you will be taught how to prepare and give the pre-filled syringe or how to use the On-body Injector. Refer to the patient Instructions for Use for detailed instructions. Use exactly as directed. Tell your healthcare provider immediately if you suspect that the On-body Injector may not have performed as intended or if you suspect the use of the On-body Injector resulted in a missed or partial dose.  It is important that you put your used needles and syringes in a special sharps container. Do not put them in a trash can. If you do not have a sharps container, call your pharmacist or healthcare provider to get one.  Talk to your pediatrician regarding the use of this medicine in children. While this drug may be prescribed for  selected conditions, precautions do apply.  Overdosage: If you think you have taken too much of this medicine contact a poison control center or emergency room at once.  NOTE: This medicine is only for you. Do not share this medicine with others.  What if I miss a dose?  It is important not to miss your dose. Call your doctor or health care professional if you miss your dose. If you miss a dose due to an On-body Injector failure or leakage, a new dose should be administered as soon as possible using a single prefilled syringe for manual use.  What may interact with this medicine?  Interactions have not been studied.  Give your health care provider a list of all the medicines, herbs, non-prescription drugs, or dietary supplements you use. Also tell them if you smoke, drink alcohol, or use illegal drugs. Some items may interact with your medicine.  This list may not describe all possible interactions. Give your health care provider a list of all the medicines, herbs, non-prescription drugs, or dietary supplements you use. Also tell them if you smoke, drink alcohol, or use illegal drugs. Some items may interact with your medicine.  What should I watch for while using this medicine?  You may need blood work done while you are taking this medicine.  If you are going to need a MRI, CT scan, or other procedure, tell your doctor that you are using this medicine (On-Body Injector only).  What side effects may I notice from receiving this medicine?  Side effects that you should report to   your doctor or health care professional as soon as possible:  -allergic reactions like skin rash, itching or hives, swelling of the face, lips, or tongue  -back pain  -dizziness  -fever  -pain, redness, or irritation at site where injected  -pinpoint red spots on the skin  -red or dark-brown urine  -shortness of breath or breathing problems  -stomach or side pain, or pain at the shoulder  -swelling  -tiredness  -trouble passing urine or  change in the amount of urine  Side effects that usually do not require medical attention (report to your doctor or health care professional if they continue or are bothersome):  -bone pain  -muscle pain  This list may not describe all possible side effects. Call your doctor for medical advice about side effects. You may report side effects to FDA at 1-800-FDA-1088.  Where should I keep my medicine?  Keep out of the reach of children.  If you are using this medicine at home, you will be instructed on how to store it. Throw away any unused medicine after the expiration date on the label.  NOTE: This sheet is a summary. It may not cover all possible information. If you have questions about this medicine, talk to your doctor, pharmacist, or health care provider.   2019 [employer]vier/Gold Standard (2017-05-17 16:57:08)

## 2018-05-20 ENCOUNTER — Inpatient Hospital Stay: Payer: BLUE CROSS/BLUE SHIELD

## 2018-05-20 ENCOUNTER — Other Ambulatory Visit: Payer: Self-pay

## 2018-05-20 VITALS — BP 113/70 | HR 77 | Temp 97.7°F | Resp 18

## 2018-05-20 DIAGNOSIS — Z171 Estrogen receptor negative status [ER-]: Secondary | ICD-10-CM

## 2018-05-20 DIAGNOSIS — Z95828 Presence of other vascular implants and grafts: Secondary | ICD-10-CM

## 2018-05-20 DIAGNOSIS — C50412 Malignant neoplasm of upper-outer quadrant of left female breast: Secondary | ICD-10-CM

## 2018-05-20 MED ORDER — SODIUM CHLORIDE 0.9 % IV SOLN
INTRAVENOUS | Status: AC
  Administered 2018-05-20: 09:00:00 via INTRAVENOUS
  Filled 2018-05-20 (×2): qty 250

## 2018-05-20 MED ORDER — SODIUM CHLORIDE 0.9% FLUSH
10.0000 mL | Freq: Once | INTRAVENOUS | Status: AC
  Administered 2018-05-20: 10 mL
  Filled 2018-05-20: qty 10

## 2018-05-20 MED ORDER — HEPARIN SOD (PORK) LOCK FLUSH 100 UNIT/ML IV SOLN
500.0000 [IU] | Freq: Once | INTRAVENOUS | Status: AC
  Administered 2018-05-20: 500 [IU]
  Filled 2018-05-20: qty 5

## 2018-05-20 NOTE — Patient Instructions (Signed)

## 2018-05-21 ENCOUNTER — Other Ambulatory Visit: Payer: Self-pay

## 2018-05-21 ENCOUNTER — Inpatient Hospital Stay: Payer: BLUE CROSS/BLUE SHIELD

## 2018-05-21 VITALS — BP 108/65 | HR 73 | Temp 97.9°F | Resp 18

## 2018-05-21 DIAGNOSIS — Z95828 Presence of other vascular implants and grafts: Secondary | ICD-10-CM

## 2018-05-21 DIAGNOSIS — Z171 Estrogen receptor negative status [ER-]: Secondary | ICD-10-CM

## 2018-05-21 DIAGNOSIS — C50412 Malignant neoplasm of upper-outer quadrant of left female breast: Secondary | ICD-10-CM

## 2018-05-21 MED ORDER — SODIUM CHLORIDE 0.9 % IV SOLN
INTRAVENOUS | Status: AC
  Administered 2018-05-21: 10:00:00 via INTRAVENOUS
  Filled 2018-05-21 (×2): qty 250

## 2018-05-21 MED ORDER — SODIUM CHLORIDE 0.9% FLUSH
10.0000 mL | Freq: Once | INTRAVENOUS | Status: AC
  Administered 2018-05-21: 10 mL
  Filled 2018-05-21: qty 10

## 2018-05-21 MED ORDER — HEPARIN SOD (PORK) LOCK FLUSH 100 UNIT/ML IV SOLN
500.0000 [IU] | Freq: Once | INTRAVENOUS | Status: AC
  Administered 2018-05-21: 500 [IU]
  Filled 2018-05-21: qty 5

## 2018-05-21 NOTE — Patient Instructions (Signed)
Coronavirus (COVID-19) Are you at risk?  Are you at risk for the Coronavirus (COVID-19)?  To be considered HIGH RISK for Coronavirus (COVID-19), you have to meet the following criteria:  . Traveled to [place], [place], [place], [place] or [place]; or in the [place] to [place], [place], [place], or [place]; and have fever, cough, and shortness of breath within the last 2 weeks of travel OR . Been in close contact with a person diagnosed with COVID-19 within the last 2 weeks and have fever, cough, and shortness of breath . IF YOU DO NOT MEET THESE CRITERIA, YOU ARE CONSIDERED LOW RISK FOR COVID-19.  What to do if you are HIGH RISK for COVID-19?  . If you are having a medical emergency, call 911. . Seek medical care right away. Before you go to a doctor's office, urgent care or emergency department, call ahead and tell them about your recent travel, contact with someone diagnosed with COVID-19, and your symptoms. You should receive instructions from your physician's office regarding next steps of care.  . When you arrive at healthcare provider, tell the healthcare staff immediately you have returned from visiting [place], [place], [place], [place] or [place]; or traveled in the [place] to [place], [place], [place], or [place]; in the last two weeks or you have been in close contact with a person diagnosed with COVID-19 in the last 2 weeks.   . Tell the health care staff about your symptoms: fever, cough and shortness of breath. . After you have been seen by a medical provider, you will be either: o Tested for (COVID-19) and discharged home on quarantine except to seek medical care if symptoms worsen, and asked to  - Stay home and avoid contact with others until you get your results (4-5 days)  - Avoid travel on public transportation if possible (such as bus, train, or airplane) or o Sent to the Emergency Department by EMS for evaluation, COVID-19 testing, and possible  admission depending on your condition and test results.  What to do if you are LOW RISK for COVID-19?  Reduce your risk of any infection by using the same precautions used for avoiding the common cold or flu:  . Wash your hands often with soap and warm water for at least 20 seconds.  If soap and water are not readily available, use an alcohol-based hand sanitizer with at least 60% alcohol.  . If coughing or sneezing, cover your mouth and nose by coughing or sneezing into the elbow areas of your shirt or coat, into a tissue or into your sleeve (not your hands). . Avoid shaking hands with others and consider head nods or verbal greetings only. . Avoid touching your eyes, nose, or mouth with unwashed hands.  . Avoid close contact with people who are sick. . Avoid places or events with large numbers of people in one location, like concerts or sporting events. . Carefully consider travel plans you have or are making. . If you are planning any travel outside or inside the [place], visit the CDC's Travelers' Health webpage for the latest health notices. . If you have some symptoms but not all symptoms, continue to monitor at home and seek medical attention if your symptoms worsen. . If you are having a medical emergency, call 911.   ADDITIONAL HEALTHCARE OPTIONS FOR PATIENTS  [facility] / e-Visit: [email or web address]         [facility]: [phone]  [facility]: [phone]                   [facility]: [phone]    Dehydration, Adult  Dehydration is a condition in which there is not enough fluid or water in the body. This happens when you lose more fluids than you take in. Important organs, such as the kidneys, brain, and heart, cannot function without a proper amount of fluids. Any loss of fluids from the body can lead to dehydration. Dehydration can range from mild to severe. This condition  should be treated right away to prevent it from becoming severe. What are the causes? This condition may be caused by:  Vomiting.  Diarrhea.  Excessive sweating, such as from heat exposure or exercise.  Not drinking enough fluid, especially: ? When ill. ? While doing activity that requires a lot of energy.  Excessive urination.  Fever.  Infection.  Certain medicines, such as medicines that cause the body to lose excess fluid (diuretics).  Inability to access safe drinking water.  Reduced physical ability to get adequate water and food. What increases the risk? This condition is more likely to develop in people:  Who have a poorly controlled long-term (chronic) illness, such as diabetes, heart disease, or kidney disease.  Who are age 65 or older.  Who are disabled.  Who live in a place with high altitude.  Who play endurance sports. What are the signs or symptoms? Symptoms of mild dehydration may include:  Thirst.  Dry lips.  Slightly dry mouth.  Dry, warm skin.  Dizziness. Symptoms of moderate dehydration may include:  Very dry mouth.  Muscle cramps.  Dark urine. Urine may be the color of tea.  Decreased urine production.  Decreased tear production.  Heartbeat that is irregular or faster than normal (palpitations).  Headache.  Light-headedness, especially when you stand up from a sitting position.  Fainting (syncope). Symptoms of severe dehydration may include:  Changes in skin, such as: ? Cold and clammy skin. ? Blotchy (mottled) or pale skin. ? Skin that does not quickly return to normal after being lightly pinched and released (poor skin turgor).  Changes in body fluids, such as: ? Extreme thirst. ? No tear production. ? Inability to sweat when body temperature is high, such as in hot weather. ? Very little urine production.  Changes in vital signs, such as: ? Weak pulse. ? Pulse that is more than 100 beats a minute when sitting  still. ? Rapid breathing. ? Low blood pressure.  Other changes, such as: ? Sunken eyes. ? Cold hands and feet. ? Confusion. ? Lack of energy (lethargy). ? Difficulty waking up from sleep. ? Short-term weight loss. ? Unconsciousness. How is this diagnosed? This condition is diagnosed based on your symptoms and a physical exam. Blood and urine tests may be done to help confirm the diagnosis. How is this treated? Treatment for this condition depends on the severity. Mild or moderate dehydration can often be treated at home. Treatment should be started right away. Do not wait until dehydration becomes severe. Severe dehydration is an emergency and it needs to be treated in a hospital. Treatment for mild dehydration may include:  Drinking more fluids.  Replacing salts and minerals in your blood (electrolytes) that you may have lost. Treatment for moderate dehydration may include:  Drinking an oral rehydration solution (ORS). This is a drink that helps you replace fluids and electrolytes (rehydrate). It can be found at pharmacies and retail   stores. Treatment for severe dehydration may include:  Receiving fluids through an IV tube.  Receiving an electrolyte solution through a feeding tube that is passed through your nose and into your stomach (nasogastric tube, or NG tube).  Correcting any abnormalities in electrolytes.  Treating the underlying cause of dehydration. Follow these instructions at home:  If directed by your health care provider, drink an ORS: ? Make an ORS by following instructions on the package. ? Start by drinking small amounts, about  cup (120 mL) every 5-10 minutes. ? Slowly increase how much you drink until you have taken the amount recommended by your health care provider.  Drink enough clear fluid to keep your urine clear or pale yellow. If you were told to drink an ORS, finish the ORS first, then start slowly drinking other clear fluids. Drink fluids such as:  ? Water. Do not drink only water. Doing that can lead to having too little salt (sodium) in the body (hyponatremia). ? Ice chips. ? Fruit juice that you have added water to (diluted fruit juice). ? Low-calorie sports drinks.  Avoid: ? Alcohol. ? Drinks that contain a lot of sugar. These include high-calorie sports drinks, fruit juice that is not diluted, and soda. ? Caffeine. ? Foods that are greasy or contain a lot of fat or sugar.  Take over-the-counter and prescription medicines only as told by your health care provider.  Do not take sodium tablets. This can lead to having too much sodium in the body (hypernatremia).  Eat foods that contain a healthy balance of electrolytes, such as bananas, oranges, potatoes, tomatoes, and spinach.  Keep all follow-up visits as told by your health care provider. This is important. Contact a health care provider if:  You have abdominal pain that: ? Gets worse. ? Stays in one area (localizes).  You have a rash.  You have a stiff neck.  You are more irritable than usual.  You are sleepier or more difficult to wake up than usual.  You feel weak or dizzy.  You feel very thirsty.  You have urinated only a small amount of very dark urine over 6-8 hours. Get help right away if:  You have symptoms of severe dehydration.  You cannot drink fluids without vomiting.  Your symptoms get worse with treatment.  You have a fever.  You have a severe headache.  You have vomiting or diarrhea that: ? Gets worse. ? Does not go away.  You have blood or green matter (bile) in your vomit.  You have blood in your stool. This may cause stool to look black and tarry.  You have not urinated in 6-8 hours.  You faint.  Your heart rate while sitting still is over 100 beats a minute.  You have trouble breathing. This information is not intended to replace advice given to you by your health care provider. Make sure you discuss any questions you have  with your health care provider. Document Released: 02/09/2005 Document Revised: 09/06/2015 Document Reviewed: 04/05/2015 Elsevier Interactive Patient Education  2019 [employer]  

## 2018-05-24 ENCOUNTER — Telehealth: Payer: Self-pay | Admitting: *Deleted

## 2018-05-24 ENCOUNTER — Encounter: Payer: Self-pay | Admitting: Oncology

## 2018-05-24 ENCOUNTER — Inpatient Hospital Stay: Payer: BLUE CROSS/BLUE SHIELD

## 2018-05-24 ENCOUNTER — Inpatient Hospital Stay (HOSPITAL_BASED_OUTPATIENT_CLINIC_OR_DEPARTMENT_OTHER): Payer: BLUE CROSS/BLUE SHIELD | Admitting: Adult Health

## 2018-05-24 ENCOUNTER — Other Ambulatory Visit: Payer: Self-pay

## 2018-05-24 ENCOUNTER — Encounter: Payer: Self-pay | Admitting: Adult Health

## 2018-05-24 VITALS — BP 98/54 | HR 96 | Resp 18

## 2018-05-24 VITALS — BP 98/51 | HR 111 | Temp 97.1°F | Resp 18 | Ht 64.0 in | Wt 151.9 lb

## 2018-05-24 DIAGNOSIS — Z7689 Persons encountering health services in other specified circumstances: Secondary | ICD-10-CM

## 2018-05-24 DIAGNOSIS — J3489 Other specified disorders of nose and nasal sinuses: Secondary | ICD-10-CM

## 2018-05-24 DIAGNOSIS — I959 Hypotension, unspecified: Secondary | ICD-10-CM

## 2018-05-24 DIAGNOSIS — Z8051 Family history of malignant neoplasm of kidney: Secondary | ICD-10-CM

## 2018-05-24 DIAGNOSIS — C50412 Malignant neoplasm of upper-outer quadrant of left female breast: Secondary | ICD-10-CM | POA: Diagnosis not present

## 2018-05-24 DIAGNOSIS — K5900 Constipation, unspecified: Secondary | ICD-10-CM

## 2018-05-24 DIAGNOSIS — Z5111 Encounter for antineoplastic chemotherapy: Secondary | ICD-10-CM

## 2018-05-24 DIAGNOSIS — D709 Neutropenia, unspecified: Secondary | ICD-10-CM

## 2018-05-24 DIAGNOSIS — Z171 Estrogen receptor negative status [ER-]: Principal | ICD-10-CM

## 2018-05-24 DIAGNOSIS — I700 Atherosclerosis of aorta: Secondary | ICD-10-CM

## 2018-05-24 DIAGNOSIS — Z95828 Presence of other vascular implants and grafts: Secondary | ICD-10-CM

## 2018-05-24 DIAGNOSIS — C773 Secondary and unspecified malignant neoplasm of axilla and upper limb lymph nodes: Secondary | ICD-10-CM

## 2018-05-24 DIAGNOSIS — R42 Dizziness and giddiness: Secondary | ICD-10-CM

## 2018-05-24 DIAGNOSIS — Z801 Family history of malignant neoplasm of trachea, bronchus and lung: Secondary | ICD-10-CM

## 2018-05-24 DIAGNOSIS — R05 Cough: Secondary | ICD-10-CM

## 2018-05-24 DIAGNOSIS — Z79899 Other long term (current) drug therapy: Secondary | ICD-10-CM

## 2018-05-24 DIAGNOSIS — R112 Nausea with vomiting, unspecified: Secondary | ICD-10-CM

## 2018-05-24 LAB — CBC WITH DIFFERENTIAL/PLATELET
Abs Immature Granulocytes: 0.01 10*3/uL (ref 0.00–0.07)
Basophils Absolute: 0 10*3/uL (ref 0.0–0.1)
Basophils Relative: 0 %
EOS ABS: 0 10*3/uL (ref 0.0–0.5)
Eosinophils Relative: 0 %
HCT: 37.8 % (ref 36.0–46.0)
Hemoglobin: 12.3 g/dL (ref 12.0–15.0)
IMMATURE GRANULOCYTES: 4 %
Lymphocytes Relative: 84 %
Lymphs Abs: 0.2 10*3/uL — ABNORMAL LOW (ref 0.7–4.0)
MCH: 29.4 pg (ref 26.0–34.0)
MCHC: 32.5 g/dL (ref 30.0–36.0)
MCV: 90.2 fL (ref 80.0–100.0)
Monocytes Absolute: 0 10*3/uL — ABNORMAL LOW (ref 0.1–1.0)
Monocytes Relative: 4 %
Neutro Abs: 0 10*3/uL — CL (ref 1.7–7.7)
Neutrophils Relative %: 8 %
Platelets: 168 10*3/uL (ref 150–400)
RBC: 4.19 MIL/uL (ref 3.87–5.11)
RDW: 12.4 % (ref 11.5–15.5)
WBC: 0.3 10*3/uL — CL (ref 4.0–10.5)
nRBC: 0 % (ref 0.0–0.2)

## 2018-05-24 LAB — COMPREHENSIVE METABOLIC PANEL
ALT: 24 U/L (ref 0–44)
AST: 11 U/L — ABNORMAL LOW (ref 15–41)
Albumin: 3.4 g/dL — ABNORMAL LOW (ref 3.5–5.0)
Alkaline Phosphatase: 91 U/L (ref 38–126)
Anion gap: 11 (ref 5–15)
BUN: 13 mg/dL (ref 6–20)
CALCIUM: 8.7 mg/dL — AB (ref 8.9–10.3)
CO2: 22 mmol/L (ref 22–32)
Chloride: 102 mmol/L (ref 98–111)
Creatinine, Ser: 0.65 mg/dL (ref 0.44–1.00)
GFR calc Af Amer: >60 mL/min (ref >60)
Glucose, Bld: 126 mg/dL — ABNORMAL HIGH (ref 70–99)
Potassium: 4.1 mmol/L (ref 3.5–5.1)
Sodium: 135 mmol/L (ref 135–145)
Total Bilirubin: 0.8 mg/dL (ref 0.3–1.2)
Total Protein: 6.7 g/dL (ref 6.5–8.1)

## 2018-05-24 MED ORDER — SODIUM CHLORIDE 0.9 % IV SOLN
INTRAVENOUS | Status: AC
  Administered 2018-05-24: 10:00:00 via INTRAVENOUS
  Filled 2018-05-24 (×2): qty 250

## 2018-05-24 MED ORDER — HEPARIN SOD (PORK) LOCK FLUSH 100 UNIT/ML IV SOLN
500.0000 [IU] | Freq: Once | INTRAVENOUS | Status: AC
  Administered 2018-05-24: 500 [IU]
  Filled 2018-05-24: qty 5

## 2018-05-24 MED ORDER — ONDANSETRON HCL 4 MG/2ML IJ SOLN
8.0000 mg | Freq: Once | INTRAMUSCULAR | Status: AC
  Administered 2018-05-24: 8 mg via INTRAVENOUS

## 2018-05-24 MED ORDER — SODIUM CHLORIDE 0.9% FLUSH
10.0000 mL | Freq: Once | INTRAVENOUS | Status: AC
  Administered 2018-05-24: 10 mL
  Filled 2018-05-24: qty 10

## 2018-05-24 MED ORDER — ONDANSETRON HCL 4 MG/2ML IJ SOLN
INTRAMUSCULAR | Status: AC
  Filled 2018-05-24: qty 4

## 2018-05-24 NOTE — Telephone Encounter (Signed)
[facility] WBC 0.3  and ANC 0.02 called report received from lab and given in writing to LCC/NP

## 2018-05-24 NOTE — Patient Instructions (Signed)
You can take Senokot-S as much as two tablets twice a day for your constipation (I would start out taking it like this).  You can take Miralax daily as needed for constipation.  If you go three to four days without a bowel movement, we recommend magnesium citrate 1/2 bottle, followed by 8 ounces of water.  If no response in 6 hours you can take the rest of the bottle.    If you develop any abdominal pain, cramping, or vomiting, please call us immediately.      Constipation, Adult Constipation is when a person has fewer bowel movements in a week than normal, has difficulty having a bowel movement, or has stools that are dry, hard, or larger than normal. Constipation may be caused by an underlying condition. It may become worse with age if a person takes certain medicines and does not take in enough fluids. Follow these instructions at home: Eating and drinking   Eat foods that have a lot of fiber, such as fresh fruits and vegetables, whole grains, and beans.  Limit foods that are high in fat, low in fiber, or overly processed, such as french fries, hamburgers, cookies, candies, and soda.  Drink enough fluid to keep your urine clear or pale yellow. General instructions  Exercise regularly or as told by your health care provider.  Go to the restroom when you have the urge to go. Do not hold it in.  Take over-the-counter and prescription medicines only as told by your health care provider. These include any fiber supplements.  Practice pelvic floor retraining exercises, such as deep breathing while relaxing the lower abdomen and pelvic floor relaxation during bowel movements.  Watch your condition for any changes.  Keep all follow-up visits as told by your health care provider. This is important. Contact a health care provider if:  You have pain that gets worse.  You have a fever.  You do not have a bowel movement after 4 days.  You vomit.  You are not hungry.  You lose  weight.  You are bleeding from the anus.  You have thin, pencil-like stools. Get help right away if:  You have a fever and your symptoms suddenly get worse.  You leak stool or have blood in your stool.  Your abdomen is bloated.  You have severe pain in your abdomen.  You feel dizzy or you faint. This information is not intended to replace advice given to you by your health care provider. Make sure you discuss any questions you have with your health care provider. Document Released: 11/08/2003 Document Revised: 08/30/2015 Document Reviewed: 07/31/2015 Elsevier Interactive Patient Education  2019 [employer].

## 2018-05-24 NOTE — Patient Instructions (Signed)
Coronavirus (COVID-19) Are you at risk?  Are you at risk for the Coronavirus (COVID-19)?  To be considered HIGH RISK for Coronavirus (COVID-19), you have to meet the following criteria:  . Traveled to [place], [place], [place], [place] or [place]; or in the [place] to [place], [place], [place], or [place]; and have fever, cough, and shortness of breath within the last 2 weeks of travel OR . Been in close contact with a person diagnosed with COVID-19 within the last 2 weeks and have fever, cough, and shortness of breath . IF YOU DO NOT MEET THESE CRITERIA, YOU ARE CONSIDERED LOW RISK FOR COVID-19.  What to do if you are HIGH RISK for COVID-19?  . If you are having a medical emergency, call 911. . Seek medical care right away. Before you go to a doctor's office, urgent care or emergency department, call ahead and tell them about your recent travel, contact with someone diagnosed with COVID-19, and your symptoms. You should receive instructions from your physician's office regarding next steps of care.  . When you arrive at healthcare provider, tell the healthcare staff immediately you have returned from visiting [place], [place], [place], [place] or [place]; or traveled in the [place] to [place], [place], [place], or [place]; in the last two weeks or you have been in close contact with a person diagnosed with COVID-19 in the last 2 weeks.   . Tell the health care staff about your symptoms: fever, cough and shortness of breath. . After you have been seen by a medical provider, you will be either: o Tested for (COVID-19) and discharged home on quarantine except to seek medical care if symptoms worsen, and asked to  - Stay home and avoid contact with others until you get your results (4-5 days)  - Avoid travel on public transportation if possible (such as bus, train, or airplane) or o Sent to the Emergency Department by EMS for evaluation, COVID-19 testing, and possible  admission depending on your condition and test results.  What to do if you are LOW RISK for COVID-19?  Reduce your risk of any infection by using the same precautions used for avoiding the common cold or flu:  . Wash your hands often with soap and warm water for at least 20 seconds.  If soap and water are not readily available, use an alcohol-based hand sanitizer with at least 60% alcohol.  . If coughing or sneezing, cover your mouth and nose by coughing or sneezing into the elbow areas of your shirt or coat, into a tissue or into your sleeve (not your hands). . Avoid shaking hands with others and consider head nods or verbal greetings only. . Avoid touching your eyes, nose, or mouth with unwashed hands.  . Avoid close contact with people who are sick. . Avoid places or events with large numbers of people in one location, like concerts or sporting events. . Carefully consider travel plans you have or are making. . If you are planning any travel outside or inside the [place], visit the CDC's Travelers' Health webpage for the latest health notices. . If you have some symptoms but not all symptoms, continue to monitor at home and seek medical attention if your symptoms worsen. . If you are having a medical emergency, call 911.   ADDITIONAL HEALTHCARE OPTIONS FOR PATIENTS  [facility] / e-Visit: [email or web address]         [facility]: [phone]  [facility]: [phone]                   [facility]: [phone]    Dehydration, Adult  Dehydration is a condition in which there is not enough fluid or water in the body. This happens when you lose more fluids than you take in. Important organs, such as the kidneys, brain, and heart, cannot function without a proper amount of fluids. Any loss of fluids from the body can lead to dehydration. Dehydration can range from mild to severe. This condition  should be treated right away to prevent it from becoming severe. What are the causes? This condition may be caused by:  Vomiting.  Diarrhea.  Excessive sweating, such as from heat exposure or exercise.  Not drinking enough fluid, especially: ? When ill. ? While doing activity that requires a lot of energy.  Excessive urination.  Fever.  Infection.  Certain medicines, such as medicines that cause the body to lose excess fluid (diuretics).  Inability to access safe drinking water.  Reduced physical ability to get adequate water and food. What increases the risk? This condition is more likely to develop in people:  Who have a poorly controlled long-term (chronic) illness, such as diabetes, heart disease, or kidney disease.  Who are age 65 or older.  Who are disabled.  Who live in a place with high altitude.  Who play endurance sports. What are the signs or symptoms? Symptoms of mild dehydration may include:  Thirst.  Dry lips.  Slightly dry mouth.  Dry, warm skin.  Dizziness. Symptoms of moderate dehydration may include:  Very dry mouth.  Muscle cramps.  Dark urine. Urine may be the color of tea.  Decreased urine production.  Decreased tear production.  Heartbeat that is irregular or faster than normal (palpitations).  Headache.  Light-headedness, especially when you stand up from a sitting position.  Fainting (syncope). Symptoms of severe dehydration may include:  Changes in skin, such as: ? Cold and clammy skin. ? Blotchy (mottled) or pale skin. ? Skin that does not quickly return to normal after being lightly pinched and released (poor skin turgor).  Changes in body fluids, such as: ? Extreme thirst. ? No tear production. ? Inability to sweat when body temperature is high, such as in hot weather. ? Very little urine production.  Changes in vital signs, such as: ? Weak pulse. ? Pulse that is more than 100 beats a minute when sitting  still. ? Rapid breathing. ? Low blood pressure.  Other changes, such as: ? Sunken eyes. ? Cold hands and feet. ? Confusion. ? Lack of energy (lethargy). ? Difficulty waking up from sleep. ? Short-term weight loss. ? Unconsciousness. How is this diagnosed? This condition is diagnosed based on your symptoms and a physical exam. Blood and urine tests may be done to help confirm the diagnosis. How is this treated? Treatment for this condition depends on the severity. Mild or moderate dehydration can often be treated at home. Treatment should be started right away. Do not wait until dehydration becomes severe. Severe dehydration is an emergency and it needs to be treated in a hospital. Treatment for mild dehydration may include:  Drinking more fluids.  Replacing salts and minerals in your blood (electrolytes) that you may have lost. Treatment for moderate dehydration may include:  Drinking an oral rehydration solution (ORS). This is a drink that helps you replace fluids and electrolytes (rehydrate). It can be found at pharmacies and retail   stores. Treatment for severe dehydration may include:  Receiving fluids through an IV tube.  Receiving an electrolyte solution through a feeding tube that is passed through your nose and into your stomach (nasogastric tube, or NG tube).  Correcting any abnormalities in electrolytes.  Treating the underlying cause of dehydration. Follow these instructions at home:  If directed by your health care provider, drink an ORS: ? Make an ORS by following instructions on the package. ? Start by drinking small amounts, about  cup (120 mL) every 5-10 minutes. ? Slowly increase how much you drink until you have taken the amount recommended by your health care provider.  Drink enough clear fluid to keep your urine clear or pale yellow. If you were told to drink an ORS, finish the ORS first, then start slowly drinking other clear fluids. Drink fluids such as:  ? Water. Do not drink only water. Doing that can lead to having too little salt (sodium) in the body (hyponatremia). ? Ice chips. ? Fruit juice that you have added water to (diluted fruit juice). ? Low-calorie sports drinks.  Avoid: ? Alcohol. ? Drinks that contain a lot of sugar. These include high-calorie sports drinks, fruit juice that is not diluted, and soda. ? Caffeine. ? Foods that are greasy or contain a lot of fat or sugar.  Take over-the-counter and prescription medicines only as told by your health care provider.  Do not take sodium tablets. This can lead to having too much sodium in the body (hypernatremia).  Eat foods that contain a healthy balance of electrolytes, such as bananas, oranges, potatoes, tomatoes, and spinach.  Keep all follow-up visits as told by your health care provider. This is important. Contact a health care provider if:  You have abdominal pain that: ? Gets worse. ? Stays in one area (localizes).  You have a rash.  You have a stiff neck.  You are more irritable than usual.  You are sleepier or more difficult to wake up than usual.  You feel weak or dizzy.  You feel very thirsty.  You have urinated only a small amount of very dark urine over 6-8 hours. Get help right away if:  You have symptoms of severe dehydration.  You cannot drink fluids without vomiting.  Your symptoms get worse with treatment.  You have a fever.  You have a severe headache.  You have vomiting or diarrhea that: ? Gets worse. ? Does not go away.  You have blood or green matter (bile) in your vomit.  You have blood in your stool. This may cause stool to look black and tarry.  You have not urinated in 6-8 hours.  You faint.  Your heart rate while sitting still is over 100 beats a minute.  You have trouble breathing. This information is not intended to replace advice given to you by your health care provider. Make sure you discuss any questions you have  with your health care provider. Document Released: 02/09/2005 Document Revised: 09/06/2015 Document Reviewed: 04/05/2015 Elsevier Interactive Patient Education  2019 [employer]  

## 2018-05-24 NOTE — Progress Notes (Signed)
[place]  Telephone:([phone] Fax:([phone]) [phone]    ID: Kathryn [name] DOB: 07-19-1960  MR#: [id number]  CLE#:[id number]  Patient Care Team: Kandace Blitz, MD as PCP - General (Obstetrics and Gynecology) Rockwell Germany, RN as Oncology Nurse Navigator Mauro Kaufmann, RN as Oncology Nurse Navigator Erroll Luna, MD as Consulting Physician (General Surgery) Magrinat, Virgie Dad, MD as Consulting Physician (Oncology) Kyung Rudd, MD as Consulting Physician (Radiation Oncology) OTHER MD: Rana Snare (OBGYN)   CHIEF COMPLAINT: Triple negative breast cancer  CURRENT TREATMENT: Neoadjuvant chemotherapy   HISTORY OF CURRENT ILLNESS: From the original intake note:  Kathryn [name] presented with a palpable subareolar left breast lump, nipple retraction, and discharge for approximately 1 week. She underwent bilateral diagnostic mammography with tomography and left breast ultrasonography at [facility] on 04/01/2018 showing: Breast Density Category C. An irregular hyperdense mass is seen in the left subareolar region. An additional oval, circumscribed mass is seen in the inferior central aspect at posterior depth. No additional suspicious findings are identified within the remainder of the left breast. On physical exam, there is a 2-3 cm firm, fixed lump in the subareolar region on the left. Subtle skin changes and retraction is noted along the left nipple. Targeted ultrasound is performed, showing an irregular hypoechoic mass with associated vascularity at the 12 o'clock retroareolar position on the left. Overall measurements are 2.6 x 2.3 x 1.2 cm. This corresponds with the mammographic finding. Two adjacent circumscribed hypoechoic masses are identified in the deep 6 o'clock position 2 cm from the nipple. They measure 0.8 x 0.6 x 0.4 cm and 1.3 x 1.1 x 0.7 cm. There is no seated vascularity. This corresponds with the additional mammographic finding and likely represents  minimally complicated cyst. Evaluation of the left axilla demonstrates a markedly enlarged abnormal lymph node with complete hilar replacement. It measures up to 5 cm in long axis dimension. No suspicious mammographic findings on the right.   Accordingly on 04/06/2018 she proceeded to biopsy of the left breast area in question. The pathology from this procedure showed ([id number]): invasive ductal carcinoma, grade III, with lymphovascular invasion. Prognostic indicators significant for: estrogen receptor, 0% negative and progesterone receptor, 0% negative. Proliferation marker Ki67 at 70%. HER2 equivocal (2+) by immunohistochemistry, but negative by FISH (print3ed report pending).  On the same day she underwent a biopsy of the left axillary lymph node on 04/06/2018 showing ([id number]): metastatic carcinoma in 1 of 1 lymph node (1/1).   The patient's subsequent history is as detailed below.   INTERVAL HISTORY: Kaari returns today for follow-up and treatment of her triple negative breast cancer. She is unaccompanied.    She continues on neoadjuvant chemotherapy with Doxorubicin and Cyclophosphamide, on day 1 of a 14 day cycle with udenyca on day 3. Today is day 8 cycle 2.  She also received IV fluids on days 4 and 5 of treatment.  REVIEW OF SYSTEMS: Colie is not feeling well today.  She reports feeling very tired. This has been worse with this cycle.  She notes that she was taken off of compazine after her last treatment and changed to Zofran. Her nausea has been improved.  She is eating and drinking well.  She notes that she is constipated much more than with her first cycle.  She says that she had some nausea and dry heaves last night. This resolved after going to the restroom and having a bowel movement.  She is planning on getting some colace  today for her bowels.    She is taking Cipro BID.  She says that she is tolerating this moderately well.  She does occasionally get dizzy.  She is tired.   She denies fevers, chills, cough, shortness of breath, chest pain, or palpitations.  She hasn't had any urinary issues.  She says that her eyes are matting together on occasion.  She has no drainage, and this resolves after warm compresses.  Otherwise, a detailed ROS was non contributory.     PAST MEDICAL HISTORY: Past Medical History:  Diagnosis Date   Breast cancer ([place])    stage 3 - left   Family history of kidney cancer    Family history of melanoma      PAST SURGICAL HISTORY: Past Surgical History:  Procedure Laterality Date   CESAREAN SECTION     x3   PORTACATH PLACEMENT N/A 04/28/2018   Procedure: INSERTION PORT-A-CATH WITH ULTRASOUND;  Surgeon: Erroll Luna, MD;  Location: [facility] OR;  Service: General;  Laterality: N/A;     FAMILY HISTORY: Family History  Problem Relation Age of Onset   Kidney cancer Sister 48       d. 85   Lung cancer Paternal Uncle    Melanoma Sister    Melanoma Sister    Neeka's father died from congestive heart failure at age 56. Patients' mother is 56 as of 03/2018. The patient has 1 brother and 3 sisters. Patient denies anyone in her family having breast, ovarian, prostate, or pancreatic cancer. Kenlee's sister, [name], was diagnosed with Kidney Cancer at 50. Niasha has an uncle and a cousin that were diagnosed with lung cancer, but they were both heavy smokers.    GYNECOLOGIC HISTORY:  No LMP recorded. Patient is postmenopausal. 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 Her husband, Cortina Vultaggio, owns [employer]. Together, they have three children, [name], [place], and [place]. Ashyah Quizon is 56, lives in [place], and works as a Theme park manager for the [employer]. Dawnn Nam is 58, lives in [place], and is  a Equities trader at [employer]. Tauri Ethington is 56, is a Ship broker, and lives at home with [name] and [name].   ADVANCED DIRECTIVES: Claudine's husband, Jerzy Crotteau, is automatically her healthcare power of attorney.    HEALTH MAINTENANCE: Social History   Tobacco Use   Smoking status: Never Smoker   Smokeless tobacco: Never Used  Substance Use Topics   Alcohol use: Yes    Comment: socially   Drug use: Never    Colonoscopy: no  PAP: 2015  Bone density: no   No Known Allergies  Current Outpatient Medications  Medication Sig Dispense Refill   ciprofloxacin (CIPRO) 500 MG tablet Take 1 tablet (500 mg total) by mouth 2 (two) times daily. 40 tablet 1   dexamethasone (DECADRON) 4 MG tablet Take 2 tablets by mouth once a day on the day after chemotherapy and then take 2 tablets two times a day for 2 days. Take with food. 30 tablet 1   Docusate Sodium (COLACE PO) Take by mouth as needed.     ibuprofen (ADVIL,MOTRIN) 800 MG tablet Take 1 tablet (800 mg total) by mouth every 8 (eight) hours as needed. 30 tablet 0   lidocaine-prilocaine (EMLA) cream Apply to affected  area once 30 g 3   loratadine (CLARITIN) 10 MG tablet Take 1 tablet (10 mg total) by mouth daily.     LORazepam (ATIVAN) 0.5 MG tablet Take 1 tablet (0.5 mg total) by mouth at bedtime as needed (Nausea or vomiting). 30 tablet 0   magic mouthwash w/lidocaine SOLN swish, gargle & spit 5-10 MILLILITER EVERY 6 HOURS AS NEEDED     omeprazole (PRILOSEC) 20 MG capsule Take 1 capsule (20 mg total) by mouth daily as needed. 30 capsule 3   ondansetron (ZOFRAN) 8 MG tablet Take 0.5-1 tablets (4-8 mg total) by mouth 3 (three) times daily as needed for nausea or vomiting. 20 tablet 0   No current facility-administered medications for this visit.      OBJECTIVE:  Vitals:   05/24/18 0952  BP: (!) 98/51  Pulse: (!) 111  Resp: 18  Temp: (!) 97.1 F (36.2 C)  SpO2: 100%     Body mass  index is 26.07 kg/m.   Wt Readings from Last 3 Encounters:  05/24/18 151 lb 14.4 oz (68.9 kg)  05/17/18 157 lb 1.6 oz (71.3 kg)  05/10/18 153 lb 11.2 oz (69.7 kg)  ECOG FS:1 GENERAL: Patient is a well appearing female in no acute distress HEENT:  Sclerae anicteric.  Oropharynx clear and moist. No ulcerations or evidence of oropharyngeal candidiasis. Neck is supple.  NODES:  No cervical, supraclavicular, or axillary lymphadenopathy palpated.  BREAST EXAM: left breast with palpable tumor, and fixed nipple, tumor softer per patient, skin change is improved as compared to last weeks exam LUNGS:  Clear to auscultation bilaterally.  No wheezes or rhonchi. HEART:  Regular rate and rhythm. No murmur appreciated. ABDOMEN:  Soft, nontender.  Positive, normoactive bowel sounds. No organomegaly palpated. MSK:  No focal spinal tenderness to palpation. Full range of motion bilaterally in the upper extremities. EXTREMITIES:  No peripheral edema.   SKIN:  Clear with no obvious rashes or skin changes. No nail dyscrasia. NEURO:  Nonfocal. Well oriented.  Appropriate affect.    LAB RESULTS:  CMP     Component Value Date/Time   NA 140 05/17/2018 0835   K 4.0 05/17/2018 0835   CL 107 05/17/2018 0835   CO2 22 05/17/2018 0835   GLUCOSE 105 (H) 05/17/2018 0835   BUN 10 05/17/2018 0835   CREATININE 0.67 05/17/2018 0835   CREATININE 0.83 04/13/2018 0843   CALCIUM 8.5 (L) 05/17/2018 0835   PROT 6.6 05/17/2018 0835   ALBUMIN 3.3 (L) 05/17/2018 0835   AST 19 05/17/2018 0835   AST 15 04/13/2018 0843   ALT 29 05/17/2018 0835   ALT 16 04/13/2018 0843   ALKPHOS 105 05/17/2018 0835   BILITOT <0.2 (L) 05/17/2018 0835   BILITOT 0.5 04/13/2018 0843   GFRNONAA >60 05/17/2018 0835   GFRNONAA >60 04/13/2018 0843   GFRAA >60 05/17/2018 0835   GFRAA >60 04/13/2018 0843    No results found for: TOTALPROTELP, ALBUMINELP, A1GS, A2GS, BETS, BETA2SER, GAMS, MSPIKE, SPEI  No results found for: KPAFRELGTCHN,  LAMBDASER, KAPLAMBRATIO  Lab Results  Component Value Date   WBC 0.3 (LL) 05/24/2018   NEUTROABS 0.0 (LL) 05/24/2018   HGB 12.3 05/24/2018   HCT 37.8 05/24/2018   MCV 90.2 05/24/2018   PLT 168 05/24/2018    _0 @  No results found for: LABCA2  No components found for: FYBOFB510  No results for input(s): INR in the last 168 hours.  No results found for: LABCA2  No results found for: CHE527  No results found for: UMP536  No results found for: RWE315  No results found for: CA2729  No components found for: HGQUANT  No results found for: CEA1 / No results found for: CEA1   No results found for: AFPTUMOR  No results found for: CHROMOGRNA  No results found for: PSA1  Appointment on 05/24/2018  Component Date Value Ref Range Status   WBC 05/24/2018 0.3* 4.0 - 10.5 K/uL Final   This critical result has verified and been called to [place] DODD, RN by Modena Slater on 03 31 2020 at [id number], and has been read back.    RBC 05/24/2018 4.19  3.87 - 5.11 MIL/uL Final   Hemoglobin 05/24/2018 12.3  12.0 - 15.0 g/dL Final   HCT 05/24/2018 37.8  36.0 - 46.0 % Final   MCV 05/24/2018 90.2  80.0 - 100.0 fL Final   MCH 05/24/2018 29.4  26.0 - 34.0 pg Final   MCHC 05/24/2018 32.5  30.0 - 36.0 g/dL Final   RDW 05/24/2018 12.4  11.5 - 15.5 % Final   Platelets 05/24/2018 168  150 - 400 K/uL Final   nRBC 05/24/2018 0.0  0.0 - 0.2 % Final   Neutrophils Relative % 05/24/2018 8  % Final   Neutro Abs 05/24/2018 0.0* 1.7 - 7.7 K/uL Final   This critical result has verified and been called to [place], 019 by Lianne Moris on 03 31 2020 at 0955, and has been read back.    Lymphocytes Relative 05/24/2018 84  % Final   Lymphs Abs 05/24/2018 0.2* 0.7 - 4.0 K/uL Final   Monocytes Relative 05/24/2018 4  % Final   Monocytes Absolute 05/24/2018 0.0* 0.1 - 1.0 K/uL Final   Eosinophils Relative 05/24/2018 0  % Final   Eosinophils Absolute 05/24/2018 0.0  0.0 - 0.5 K/uL  Final   Basophils Relative 05/24/2018 0  % Final   Basophils Absolute 05/24/2018 0.0  0.0 - 0.1 K/uL Final   Immature Granulocytes 05/24/2018 4  % Final   Abs Immature Granulocytes 05/24/2018 0.01  0.00 - 0.07 K/uL Final   Performed at [facility], [place] [address], [place], [place] [address]    (this displays the last labs from the last 3 days)  No results found for: TOTALPROTELP, ALBUMINELP, A1GS, A2GS, BETS, BETA2SER, GAMS, MSPIKE, SPEI (this displays SPEP labs)  No results found for: KPAFRELGTCHN, LAMBDASER, KAPLAMBRATIO (kappa/lambda light chains)  No results found for: HGBA, HGBA2QUANT, HGBFQUANT, HGBSQUAN (Hemoglobinopathy evaluation)   No results found for: LDH  No results found for: IRON, TIBC, IRONPCTSAT (Iron and TIBC)  No results found for: FERRITIN  Urinalysis No results found for: COLORURINE, APPEARANCEUR, LABSPEC, PHURINE, GLUCOSEU, HGBUR, BILIRUBINUR, KETONESUR, PROTEINUR, UROBILINOGEN, NITRITE, LEUKOCYTESUR   STUDIES:     ELIGIBLE FOR AVAILABLE RESEARCH PROTOCOL: [id number]   ASSESSMENT: 58 y.o. [place], [place] woman status post left breast upper outer quadrant biopsy 04/06/2018 for a clinical T2 N2, stage IIIC invasive ductal carcinoma, grade 3, triple negative, with an MIB-1 of 70%  (a) breast MRI 04/19/2018 shows a 5 cm central breast lesion with multiple satellite nodules and greater then 3 abnormal axillary lymph nodes  (b) baseline echocardiogram 04/25/2018 shows an ejection fraction in the 60-65% range  (c) chest CT scan and bone scan showed no metastatic disease.  Nonspecific rib changes will need to be followed  (1) neoadjuvant chemotherapy will consist of doxorubicin and cyclophosphamide in dose dense fashion x4, starting 05/03/2018, to be followed by weekly paclitaxel  and carboplatin x12   (a) Doxorubicin and Cyclophosphamide dose reduced for cycles 3 and 4 due to neutropenia  (2) definitive surgery to follow  (3) adjuvant  radiation after surgery  (4) genetics testing 04/27/2018   PLAN: Jatoria is having a difficult time today.  She is neutropenic.  Her WBC is 0 and her ANC is 0.3.  She is not febrile.  She is taking Cipro 569m PO bid and started this on Saturday, 05/21/2018.  She will continue this.  I reviewed neutropenic precautions with her in detail.  I also reviewed her persistent neutropenia with her chemotherapy with Dr. MJana Hakim  She will have her chemotherapy dose reduced for cycles three and four of treatment.    RNdiais also increasingly constipated.  She is still passing gas and is not having any stomach pain.  This is likely due to the Zofran, as it can be more constipating than Prochlorperazine is.  I wrote out a bowel regimen in her AVS for her to start which included Senokot S two tabs PO BID, Miralax daily PRN, and magnesium citrate if needed if no bowel movement after four days.  I also gave her information in her AVS about constipation, and red flags associated with it that may be a sign for her to call or seek immediate care.    RDailahis mildly hypotensive today, and has some mild dizziness.  We are going to give her one liter of IV fluids today.  I called the pharmacy and we have no constipation medication on hand to give her while she is in treatment.    I reviewed with her our recommendations to continue with social distancing and isolating from others during her chemotherapy.  As she and I reviewed at her last visit, she is aware that she is at increased risk for complications should she contract the COVID-19 virus.    Due to her low counts, increased fatigue and difficulty with this most recent cycle, Felita will take an extra week off before returning to get her third cycle of treatment.  I reviewed this with her in detail.  She will instead return on 4/14 for labs, flu, and chemotherapy. I requested her schedule be updated accordingly.    A total of (30) minutes of face-to-face time was spent  with this patient with greater than 50% of that time in counseling and care-coordination.  LWilber Bihari NP  05/24/18 10:07 AM Medical Oncology and H[facility] [address]Tel. [phone]   Fax. [phone]

## 2018-05-30 ENCOUNTER — Telehealth: Payer: Self-pay | Admitting: *Deleted

## 2018-05-30 NOTE — Telephone Encounter (Signed)
Received call from patient stating someone was supposed to call her with her new schedule as her chemo was being delayed by a week.  Confirmed appointment with patient for 4/14 at 915am for lab,flush, see Dr. Jana Hakim and treatment.

## 2018-05-31 ENCOUNTER — Ambulatory Visit: Payer: BLUE CROSS/BLUE SHIELD | Admitting: Oncology

## 2018-05-31 ENCOUNTER — Other Ambulatory Visit: Payer: BLUE CROSS/BLUE SHIELD

## 2018-05-31 ENCOUNTER — Ambulatory Visit: Payer: BLUE CROSS/BLUE SHIELD

## 2018-06-02 ENCOUNTER — Ambulatory Visit: Payer: BLUE CROSS/BLUE SHIELD

## 2018-06-03 ENCOUNTER — Ambulatory Visit: Payer: BLUE CROSS/BLUE SHIELD

## 2018-06-04 ENCOUNTER — Ambulatory Visit: Payer: BLUE CROSS/BLUE SHIELD

## 2018-06-06 NOTE — Progress Notes (Signed)
[place]  [phone] [phone]    ID: Kathryn [name] DOB: [birth year]  MR#: [id number]  IHK#:[id number]  Patient Care Team: Kathryn Blitz, MD as PCP - General (Obstetrics and Gynecology) Kathryn Germany, RN as Oncology Nurse Navigator Kathryn Kaufmann, RN as Oncology Nurse Navigator Kathryn Luna, MD as Consulting Physician (General Surgery) Kathryn [name], Kathryn Dad, MD as Consulting Physician (Oncology) Kathryn Rudd, MD as Consulting Physician (Radiation Oncology) OTHER MD: Kathryn [name] (OBGYN)   CHIEF COMPLAINT: Triple negative breast cancer  CURRENT TREATMENT: Neoadjuvant chemotherapy   HISTORY OF CURRENT ILLNESS: From the original intake note:  Kathryn [name] presented with a palpable subareolar left breast lump, nipple retraction, and discharge for approximately 1 week. She underwent bilateral diagnostic mammography with tomography and left breast ultrasonography at [facility] on 04/01/2018 showing: Breast Density Category C. An irregular hyperdense mass is seen in the left subareolar region. An additional oval, circumscribed mass is seen in the inferior central aspect at posterior depth. No additional suspicious findings are identified within the remainder of the left breast. On physical exam, there is a 2-3 cm firm, fixed lump in the subareolar region on the left. Subtle skin changes and retraction is noted along the left nipple. Targeted ultrasound is performed, showing an irregular hypoechoic mass with associated vascularity at the 12 o'clock retroareolar position on the left. Overall measurements are 2.6 x 2.3 x 1.2 cm. This corresponds with the mammographic finding. Two adjacent circumscribed hypoechoic masses are identified in the deep 6 o'clock position 2 cm from the nipple. They measure 0.8 x 0.6 x 0.4 cm and 1.3 x 1.1 x 0.7 cm. There is no seated vascularity. This corresponds with the additional mammographic finding and likely represents  minimally complicated cyst. Evaluation of the left axilla demonstrates a markedly enlarged abnormal lymph node with complete hilar replacement. It measures up to 5 cm in long axis dimension. No suspicious mammographic findings on the right.   Accordingly on 04/06/2018 she proceeded to biopsy of the left breast area in question. The pathology from this procedure showed ([id number]): invasive ductal carcinoma, grade III, with lymphovascular invasion. Prognostic indicators significant for: estrogen receptor, 0% negative and progesterone receptor, 0% negative. Proliferation marker Ki67 at 70%. HER2 equivocal (2+) by immunohistochemistry, but negative by FISH (print3ed report pending).  On the same day she underwent a biopsy of the left axillary lymph node on 04/06/2018 showing ([id number]): metastatic carcinoma in 1 of 1 lymph node (1/1).   The patient's subsequent history is as detailed below.   INTERVAL HISTORY: Kathryn [name] returns today for follow-up and treatment of her triple negative breast cancer.   She continues on neoadjuvant chemotherapy with Doxorubicin and Cyclophosphamide, on day 1 of a 14 day cycle with udenyca on day 3.  However after cycle 2 because of significant side effects and cytopenias we moved her treatments to every 3 weeks.  Today is day 1 cycle 3.   Since her last visit here, she has not undergone any additional studies.     REVIEW OF SYSTEMS: Kathryn [name] tells me she is feeling "much better" since we gave her that extra week off.  She is more active, blowing out some, but is having significant pollen allergies.  She is taking the Claritin every day now.  She has had no intercurrent fevers, shortness of breath, cough, or diarrhea.  Her [name] is doing all the shopping for them.  Her 30 year old mother currently is staying with them for protection also.  A  detailed review of systems today was stable.   PAST MEDICAL HISTORY: Past Medical History:  Diagnosis Date   Breast cancer ([place])     stage 3 - left   Family history of kidney cancer    Family history of melanoma      PAST SURGICAL HISTORY: Past Surgical History:  Procedure Laterality Date   CESAREAN SECTION     x3   PORTACATH PLACEMENT N/A 04/28/2018   Procedure: INSERTION PORT-A-CATH WITH ULTRASOUND;  Surgeon: Kathryn Luna, MD;  Location: [facility] OR;  Service: General;  Laterality: N/A;     FAMILY HISTORY: Family History  Problem Relation Age of Onset   Kidney cancer [name] 77       d. 2   Lung cancer Paternal Uncle    Melanoma [name]    Melanoma [name]    Kathryn [name] died from congestive heart failure at age 47. Patients' mother is 26 as of 03/2018. The patient has 1 brother and 3 sisters. Patient denies anyone in her family having breast, ovarian, prostate, or pancreatic cancer. Kathryn [name], Kathryn [name], was diagnosed with Kidney Cancer at 56. Kathryn [name] has an uncle and a cousin that were diagnosed with lung cancer, but they were both heavy smokers.    GYNECOLOGIC HISTORY:  No LMP recorded. Patient is postmenopausal. 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 Her [name], Kathryn [name], owns [employer]. Together, they have three children, Kathryn [name], Kathryn [name], and Kathryn [name]. Kathryn [name] is 56, lives in [place], and works as a Theme park manager for the [employer]. Kathryn [name] is 96, lives in [place], and is a Equities trader at [employer]. Kathryn [name] is 38, is a Ship broker, and lives at home with Kathryn [name] and Kathryn [name].   ADVANCED DIRECTIVES: Kathryn [name], Kathryn [name], is automatically her healthcare power of attorney.    HEALTH MAINTENANCE: Social History   Tobacco Use   Smoking status: Never Smoker   Smokeless tobacco: Never Used    Substance Use Topics   Alcohol use: Yes    Comment: socially   Drug use: Never    Colonoscopy: no  PAP: 2015  Bone density: no   No Known Allergies  Current Outpatient Medications  Medication Sig Dispense Refill   ciprofloxacin (CIPRO) 500 MG tablet Take 1 tablet (500 mg total) by mouth 2 (two) times daily. 40 tablet 1   dexamethasone (DECADRON) 4 MG tablet Take 2 tablets by mouth once a day on the day after chemotherapy and then take 2 tablets two times a day for 2 days. Take with food. 30 tablet 1   Docusate Sodium (COLACE PO) Take by mouth as needed.     ibuprofen (ADVIL,MOTRIN) 800 MG tablet Take 1 tablet (800 mg total) by mouth every 8 (eight) hours as needed. 30 tablet 0   lidocaine-prilocaine (EMLA) cream Apply to affected area once 30 g 3   loratadine (CLARITIN) 10 MG tablet Take 1 tablet (10 mg total) by mouth daily.     LORazepam (ATIVAN) 0.5 MG tablet Take 1 tablet (0.5 mg total) by mouth at bedtime as needed (Nausea or vomiting). 30 tablet 0   magic mouthwash w/lidocaine SOLN swish, gargle & spit 5-10 MILLILITER EVERY 6 HOURS AS NEEDED     omeprazole (PRILOSEC) 20 MG capsule Take 1  capsule (20 mg total) by mouth daily as needed. 30 capsule 3   ondansetron (ZOFRAN) 8 MG tablet Take 0.5-1 tablets (4-8 mg total) by mouth 3 (three) times daily as needed for nausea or vomiting. 20 tablet 0   No current facility-administered medications for this visit.      OBJECTIVE: Middle-aged white woman in no acute distress Vitals:   06/07/18 1049  BP: 116/70  Pulse: (!) 101  Resp: 18  Temp: 98.1 F (36.7 C)  SpO2: 100%     Body mass index is 26.19 kg/m.   Wt Readings from Last 3 Encounters:  06/07/18 152 lb 9.6 oz (69.2 kg)  05/24/18 151 lb 14.4 oz (68.9 kg)  05/17/18 157 lb 1.6 oz (71.3 kg)  ECOG FS:1  Sclerae unicteric, EOMs intact Oropharynx clear and moist No cervical or supraclavicular adenopathy Lungs no rales or rhonchi Heart regular rate and  rhythm Abd soft, nontender, positive bowel sounds MSK no focal spinal tenderness, no Kathryn extremity lymphedema Neuro: nonfocal, well oriented, appropriate affect Breasts: Deferred    LAB RESULTS:  CMP     Component Value Date/Time   NA 140 06/07/2018 0937   K 3.7 06/07/2018 0937   CL 107 06/07/2018 0937   CO2 21 (L) 06/07/2018 0937   GLUCOSE 97 06/07/2018 0937   BUN 11 06/07/2018 0937   CREATININE 0.72 06/07/2018 0937   CREATININE 0.83 04/13/2018 0843   CALCIUM 8.9 06/07/2018 0937   PROT 6.8 06/07/2018 0937   ALBUMIN 3.5 06/07/2018 0937   AST 31 06/07/2018 0937   AST 15 04/13/2018 0843   ALT 47 (H) 06/07/2018 0937   ALT 16 04/13/2018 0843   ALKPHOS 81 06/07/2018 0937   BILITOT 0.2 (L) 06/07/2018 0937   BILITOT 0.5 04/13/2018 0843   GFRNONAA >60 06/07/2018 0937   GFRNONAA >60 04/13/2018 0843   GFRAA >60 06/07/2018 0937   GFRAA >60 04/13/2018 0843    No results found for: TOTALPROTELP, ALBUMINELP, A1GS, A2GS, BETS, BETA2SER, GAMS, MSPIKE, SPEI  No results found for: KPAFRELGTCHN, LAMBDASER, KAPLAMBRATIO  Lab Results  Component Value Date   WBC 7.1 06/07/2018   NEUTROABS 5.2 06/07/2018   HGB 11.4 (L) 06/07/2018   HCT 35.6 (L) 06/07/2018   MCV 92.0 06/07/2018   PLT 373 06/07/2018    _0 @  No results found for: LABCA2  No components found for: KYHCWC376  No results for input(s): INR in the last 168 hours.  No results found for: LABCA2  No results found for: EGB151  No results found for: VOH607  No results found for: PXT062  No results found for: CA2729  No components found for: HGQUANT  No results found for: CEA1 / No results found for: CEA1   No results found for: AFPTUMOR  No results found for: CHROMOGRNA  No results found for: PSA1  Appointment on 06/07/2018  Component Date Value Ref Range Status   Sodium 06/07/2018 140  135 - 145 mmol/L Final   Potassium 06/07/2018 3.7  3.5 - 5.1 mmol/L Final   Chloride 06/07/2018 107   98 - 111 mmol/L Final   CO2 06/07/2018 21* 22 - 32 mmol/L Final   Glucose, Bld 06/07/2018 97  70 - 99 mg/dL Final   BUN 06/07/2018 11  6 - 20 mg/dL Final   Creatinine, Ser 06/07/2018 0.72  0.44 - 1.00 mg/dL Final   Calcium 06/07/2018 8.9  8.9 - 10.3 mg/dL Final   Total Protein 06/07/2018 6.8  6.5 - 8.1 g/dL Final  Albumin 06/07/2018 3.5  3.5 - 5.0 g/dL Final   AST 06/07/2018 31  15 - 41 U/L Final   ALT 06/07/2018 47* 0 - 44 U/L Final   Alkaline Phosphatase 06/07/2018 81  38 - 126 U/L Final   Total Bilirubin 06/07/2018 0.2* 0.3 - 1.2 mg/dL Final   GFR calc non Af Amer 06/07/2018 >60  >60 mL/min Final   GFR calc Af Amer 06/07/2018 >60  >60 mL/min Final   Anion gap 06/07/2018 12  5 - 15 Final   Performed at [facility], [place] [address], [place], [place] [address]   WBC 06/07/2018 7.1  4.0 - 10.5 K/uL Final   RBC 06/07/2018 3.87  3.87 - 5.11 MIL/uL Final   Hemoglobin 06/07/2018 11.4* 12.0 - 15.0 g/dL Final   HCT 06/07/2018 35.6* 36.0 - 46.0 % Final   MCV 06/07/2018 92.0  80.0 - 100.0 fL Final   MCH 06/07/2018 29.5  26.0 - 34.0 pg Final   MCHC 06/07/2018 32.0  30.0 - 36.0 g/dL Final   RDW 06/07/2018 14.5  11.5 - 15.5 % Final   Platelets 06/07/2018 373  150 - 400 K/uL Final   nRBC 06/07/2018 0.3* 0.0 - 0.2 % Final   Neutrophils Relative % 06/07/2018 73  % Final   Neutro Abs 06/07/2018 5.2  1.7 - 7.7 K/uL Final   Lymphocytes Relative 06/07/2018 9  % Final   Lymphs Abs 06/07/2018 0.7  0.7 - 4.0 K/uL Final   Monocytes Relative 06/07/2018 14  % Final   Monocytes Absolute 06/07/2018 1.0  0.1 - 1.0 K/uL Final   Eosinophils Relative 06/07/2018 0  % Final   Eosinophils Absolute 06/07/2018 0.0  0.0 - 0.5 K/uL Final   Basophils Relative 06/07/2018 1  % Final   Basophils Absolute 06/07/2018 0.1  0.0 - 0.1 K/uL Final   Immature Granulocytes 06/07/2018 3  % Final   Abs Immature Granulocytes 06/07/2018 0.21* 0.00 - 0.07 K/uL Final    Performed at [facility], [place] [address], [place], [place] [address]    (this displays the last labs from the last 3 days)  No results found for: TOTALPROTELP, ALBUMINELP, A1GS, A2GS, BETS, BETA2SER, GAMS, MSPIKE, SPEI (this displays SPEP labs)  No results found for: KPAFRELGTCHN, LAMBDASER, KAPLAMBRATIO (kappa/lambda light chains)  No results found for: HGBA, HGBA2QUANT, HGBFQUANT, HGBSQUAN (Hemoglobinopathy evaluation)   No results found for: LDH  No results found for: IRON, TIBC, IRONPCTSAT (Iron and TIBC)  No results found for: FERRITIN  Urinalysis No results found for: COLORURINE, APPEARANCEUR, LABSPEC, PHURINE, GLUCOSEU, HGBUR, BILIRUBINUR, KETONESUR, PROTEINUR, UROBILINOGEN, NITRITE, LEUKOCYTESUR   STUDIES:  No results found.  ELIGIBLE FOR AVAILABLE RESEARCH PROTOCOL: A1937   ASSESSMENT: 58 y.o. [place] woman status post left breast Kathryn outer quadrant biopsy 04/06/2018 for a clinical T2 N2, stage IIIC invasive ductal carcinoma, grade 3, triple negative, with an MIB-1 of 70%  (a) breast MRI 04/19/2018 shows a 5 cm central breast lesion with multiple satellite nodules and greater then 3 abnormal axillary lymph nodes  (b) baseline echocardiogram 04/25/2018 shows an ejection fraction in the 60-65% range  (c) chest CT scan and bone scan showed no metastatic disease.  Nonspecific rib changes will need to be followed  (1) neoadjuvant chemotherapy will consist of doxorubicin and cyclophosphamide in dose dense fashion x4, starting 05/03/2018, to be followed by weekly paclitaxel and carboplatin x12   (a) Doxorubicin and Cyclophosphamide dose reduced for cycles 3 and 4 due to  neutropenia  (b) dose dense changed to every 3 weeks for doxorubicin/cyclophosphamide secondary to cytopenias and side effects  (2) definitive surgery to follow  (3) adjuvant radiation after surgery  (4) genetics testing 04/27/2018   PLAN: Alyana is tolerating the  chemotherapy much better given every 3 weeks and we are going to give her the fourth cycle also with that extra week in between.  We again discussed today neutropenic precautions.  She will start ciprofloxacin on day 7.  Of course she will call us if she develops any fever shortness of breath or cough.  We have set her up for fluids on days 3 and 4, but she may cancel those if she feels they are not necessary.  She will see me again 06/28/2018, for her final cycle of AC.  We will follow-up on a full breast exam that day.  And she will be set up for the carboplatin and paclitaxel weekly treatments to follow  She knows to call for any other issue that may develop before the next visit here.  Eda Magnussen, Kathryn Dad, MD  06/07/18 11:12 AM Medical Oncology and Hematology [facility] [address] [place], [place] [address] Tel. [phone]    Fax. [phone]  I, Jacqualyn Posey am acting as a Education administrator for Chauncey Cruel, MD.   I, Lurline Del MD, have reviewed the above documentation for accuracy and completeness, and I agree with the above.

## 2018-06-07 ENCOUNTER — Encounter: Payer: Self-pay | Admitting: *Deleted

## 2018-06-07 ENCOUNTER — Inpatient Hospital Stay: Payer: BLUE CROSS/BLUE SHIELD

## 2018-06-07 ENCOUNTER — Other Ambulatory Visit: Payer: Self-pay

## 2018-06-07 ENCOUNTER — Inpatient Hospital Stay (HOSPITAL_BASED_OUTPATIENT_CLINIC_OR_DEPARTMENT_OTHER): Payer: BLUE CROSS/BLUE SHIELD | Admitting: Oncology

## 2018-06-07 ENCOUNTER — Inpatient Hospital Stay: Payer: BLUE CROSS/BLUE SHIELD | Attending: Oncology

## 2018-06-07 VITALS — HR 92

## 2018-06-07 VITALS — BP 116/70 | HR 101 | Temp 98.1°F | Resp 18 | Ht 64.0 in | Wt 152.6 lb

## 2018-06-07 DIAGNOSIS — Z801 Family history of malignant neoplasm of trachea, bronchus and lung: Secondary | ICD-10-CM | POA: Insufficient documentation

## 2018-06-07 DIAGNOSIS — C50412 Malignant neoplasm of upper-outer quadrant of left female breast: Secondary | ICD-10-CM | POA: Insufficient documentation

## 2018-06-07 DIAGNOSIS — Z85820 Personal history of malignant melanoma of skin: Secondary | ICD-10-CM | POA: Diagnosis not present

## 2018-06-07 DIAGNOSIS — Z79899 Other long term (current) drug therapy: Secondary | ICD-10-CM | POA: Insufficient documentation

## 2018-06-07 DIAGNOSIS — C773 Secondary and unspecified malignant neoplasm of axilla and upper limb lymph nodes: Secondary | ICD-10-CM

## 2018-06-07 DIAGNOSIS — Z171 Estrogen receptor negative status [ER-]: Secondary | ICD-10-CM

## 2018-06-07 DIAGNOSIS — Z7689 Persons encountering health services in other specified circumstances: Secondary | ICD-10-CM

## 2018-06-07 DIAGNOSIS — Z8051 Family history of malignant neoplasm of kidney: Secondary | ICD-10-CM

## 2018-06-07 DIAGNOSIS — Z5111 Encounter for antineoplastic chemotherapy: Secondary | ICD-10-CM

## 2018-06-07 DIAGNOSIS — Z95828 Presence of other vascular implants and grafts: Secondary | ICD-10-CM

## 2018-06-07 LAB — COMPREHENSIVE METABOLIC PANEL
ALT: 47 U/L — ABNORMAL HIGH (ref 0–44)
AST: 31 U/L (ref 15–41)
Albumin: 3.5 g/dL (ref 3.5–5.0)
Alkaline Phosphatase: 81 U/L (ref 38–126)
Anion gap: 12 (ref 5–15)
BUN: 11 mg/dL (ref 6–20)
CO2: 21 mmol/L — ABNORMAL LOW (ref 22–32)
Calcium: 8.9 mg/dL (ref 8.9–10.3)
Chloride: 107 mmol/L (ref 98–111)
Creatinine, Ser: 0.72 mg/dL (ref 0.44–1.00)
GFR calc Af Amer: >60 mL/min (ref >60)
GFR calc non Af Amer: >60 mL/min (ref >60)
Glucose, Bld: 97 mg/dL (ref 70–99)
Potassium: 3.7 mmol/L (ref 3.5–5.1)
Sodium: 140 mmol/L (ref 135–145)
Total Bilirubin: 0.2 mg/dL — ABNORMAL LOW (ref 0.3–1.2)
Total Protein: 6.8 g/dL (ref 6.5–8.1)

## 2018-06-07 LAB — CBC WITH DIFFERENTIAL/PLATELET
Abs Immature Granulocytes: 0.21 10*3/uL — ABNORMAL HIGH (ref 0.00–0.07)
Basophils Absolute: 0.1 10*3/uL (ref 0.0–0.1)
Basophils Relative: 1 %
Eosinophils Absolute: 0 10*3/uL (ref 0.0–0.5)
Eosinophils Relative: 0 %
HCT: 35.6 % — ABNORMAL LOW (ref 36.0–46.0)
Hemoglobin: 11.4 g/dL — ABNORMAL LOW (ref 12.0–15.0)
Immature Granulocytes: 3 %
Lymphocytes Relative: 9 %
Lymphs Abs: 0.7 10*3/uL (ref 0.7–4.0)
MCH: 29.5 pg (ref 26.0–34.0)
MCHC: 32 g/dL (ref 30.0–36.0)
MCV: 92 fL (ref 80.0–100.0)
Monocytes Absolute: 1 10*3/uL (ref 0.1–1.0)
Monocytes Relative: 14 %
Neutro Abs: 5.2 10*3/uL (ref 1.7–7.7)
Neutrophils Relative %: 73 %
Platelets: 373 10*3/uL (ref 150–400)
RBC: 3.87 MIL/uL (ref 3.87–5.11)
RDW: 14.5 % (ref 11.5–15.5)
WBC: 7.1 10*3/uL (ref 4.0–10.5)
nRBC: 0.3 % — ABNORMAL HIGH (ref 0.0–0.2)

## 2018-06-07 MED ORDER — SODIUM CHLORIDE 0.9% FLUSH
10.0000 mL | Freq: Once | INTRAVENOUS | Status: AC
  Administered 2018-06-07: 10 mL
  Filled 2018-06-07: qty 10

## 2018-06-07 MED ORDER — SODIUM CHLORIDE 0.9 % IV SOLN
Freq: Once | INTRAVENOUS | Status: AC
  Administered 2018-06-07: 12:00:00 via INTRAVENOUS
  Filled 2018-06-07: qty 5

## 2018-06-07 MED ORDER — SODIUM CHLORIDE 0.9 % IV SOLN
500.0000 mg/m2 | Freq: Once | INTRAVENOUS | Status: AC
  Administered 2018-06-07: 13:00:00 900 mg via INTRAVENOUS
  Filled 2018-06-07: qty 45

## 2018-06-07 MED ORDER — SODIUM CHLORIDE 0.9 % IV SOLN
Freq: Once | INTRAVENOUS | Status: AC
  Administered 2018-06-07: 12:00:00 via INTRAVENOUS
  Filled 2018-06-07: qty 250

## 2018-06-07 MED ORDER — SODIUM CHLORIDE 0.9% FLUSH
10.0000 mL | INTRAVENOUS | Status: DC | PRN
  Administered 2018-06-07: 14:00:00 10 mL
  Filled 2018-06-07: qty 10

## 2018-06-07 MED ORDER — HEPARIN SOD (PORK) LOCK FLUSH 100 UNIT/ML IV SOLN
500.0000 [IU] | Freq: Once | INTRAVENOUS | Status: AC | PRN
  Administered 2018-06-07: 14:00:00 500 [IU]
  Filled 2018-06-07: qty 5

## 2018-06-07 MED ORDER — DOXORUBICIN HCL CHEMO IV INJECTION 2 MG/ML
50.0000 mg/m2 | Freq: Once | INTRAVENOUS | Status: AC
  Administered 2018-06-07: 90 mg via INTRAVENOUS
  Filled 2018-06-07: qty 45

## 2018-06-07 NOTE — Patient Instructions (Signed)
[facility] Discharge Instructions for Patients Receiving Chemotherapy  Today you received the following chemotherapy agents Doxorubicin (ADRIAMYCIN) & Cyclophosphamide (CYTOXAN).  To help prevent nausea and vomiting after your treatment, we encourage you to take your nausea medication as prescribed.   If you develop nausea and vomiting that is not controlled by your nausea medication, call the clinic.   BELOW ARE SYMPTOMS THAT SHOULD BE REPORTED IMMEDIATELY:  *FEVER GREATER THAN 100.5 F  *CHILLS WITH OR WITHOUT FEVER  NAUSEA AND VOMITING THAT IS NOT CONTROLLED WITH YOUR NAUSEA MEDICATION  *UNUSUAL SHORTNESS OF BREATH  *UNUSUAL BRUISING OR BLEEDING  TENDERNESS IN MOUTH AND THROAT WITH OR WITHOUT PRESENCE OF ULCERS  *URINARY PROBLEMS  *BOWEL PROBLEMS  UNUSUAL RASH Items with * indicate a potential emergency and should be followed up as soon as possible.  Feel free to call the clinic should you have any questions or concerns. The clinic phone number is [phone].  Please show the CHEMO ALERT CARD at check-in to the Emergency Department and triage nurse.  Coronavirus (COVID-19) Are you at risk?  Are you at risk for the Coronavirus (COVID-19)?  To be considered HIGH RISK for Coronavirus (COVID-19), you have to meet the following criteria:  . Traveled to [place], [place], [place], [place] or [place]; or in the [place] to [place], [place], [place], or [place]; and have fever, cough, and shortness of breath within the last 2 weeks of travel OR . Been in close contact with a person diagnosed with COVID-19 within the last 2 weeks and have fever, cough, and shortness of breath . IF YOU DO NOT MEET THESE CRITERIA, YOU ARE CONSIDERED LOW RISK FOR COVID-19.  What to do if you are HIGH RISK for COVID-19?  . If you are having a medical emergency, call 911. . Seek medical care right away. Before you go to a doctor's office, urgent care or emergency  department, call ahead and tell them about your recent travel, contact with someone diagnosed with COVID-19, and your symptoms. You should receive instructions from your physician's office regarding next steps of care.  . When you arrive at healthcare provider, tell the healthcare staff immediately you have returned from visiting [place], [place], [place], [place] or [place]; or traveled in the [place] to [place], [place], [place], or [place]; in the last two weeks or you have been in close contact with a person diagnosed with COVID-19 in the last 2 weeks.   . Tell the health care staff about your symptoms: fever, cough and shortness of breath. . After you have been seen by a medical provider, you will be either: o Tested for (COVID-19) and discharged home on quarantine except to seek medical care if symptoms worsen, and asked to  - Stay home and avoid contact with others until you get your results (4-5 days)  - Avoid travel on public transportation if possible (such as bus, train, or airplane) or o Sent to the Emergency Department by EMS for evaluation, COVID-19 testing, and possible admission depending on your condition and test results.  What to do if you are LOW RISK for COVID-19?  Reduce your risk of any infection by using the same precautions used for avoiding the common cold or flu:  . Wash your hands often with soap and warm water for at least 20 seconds.  If soap and water are not readily available, use an alcohol-based hand sanitizer with at least 60% alcohol.  .   If coughing or sneezing, cover your mouth and nose by coughing or sneezing into the elbow areas of your shirt or coat, into a tissue or into your sleeve (not your hands). . Avoid shaking hands with others and consider head nods or verbal greetings only. . Avoid touching your eyes, nose, or mouth with unwashed hands.  . Avoid close contact with people who are sick. . Avoid places or events with large numbers of people  in one location, like concerts or sporting events. . Carefully consider travel plans you have or are making. . If you are planning any travel outside or inside the [place], visit the CDC's Travelers' Health webpage for the latest health notices. . If you have some symptoms but not all symptoms, continue to monitor at home and seek medical attention if your symptoms worsen. . If you are having a medical emergency, call 911.   [place] / e-Visit: [email or web address]         [facility]: [facility]: [phone]                   [facility]: [phone]

## 2018-06-07 NOTE — Progress Notes (Signed)
Discussed Cytoxan infusion time with RN. She will set pump to infuse over 45 minutes as with cycle 2.   B. Corey Skains, PharmD, BCPS, BCOP

## 2018-06-09 ENCOUNTER — Telehealth: Payer: Self-pay

## 2018-06-09 ENCOUNTER — Inpatient Hospital Stay: Payer: BLUE CROSS/BLUE SHIELD

## 2018-06-09 ENCOUNTER — Other Ambulatory Visit: Payer: Self-pay

## 2018-06-09 VITALS — BP 120/70 | HR 98 | Temp 98.1°F | Resp 16

## 2018-06-09 DIAGNOSIS — Z95828 Presence of other vascular implants and grafts: Secondary | ICD-10-CM

## 2018-06-09 DIAGNOSIS — Z171 Estrogen receptor negative status [ER-]: Secondary | ICD-10-CM

## 2018-06-09 DIAGNOSIS — C50412 Malignant neoplasm of upper-outer quadrant of left female breast: Secondary | ICD-10-CM

## 2018-06-09 MED ORDER — SODIUM CHLORIDE 0.9% FLUSH
10.0000 mL | Freq: Once | INTRAVENOUS | Status: AC
  Administered 2018-06-09: 12:00:00 10 mL
  Filled 2018-06-09: qty 10

## 2018-06-09 MED ORDER — SODIUM CHLORIDE 0.9 % IV SOLN
INTRAVENOUS | Status: AC
  Administered 2018-06-09: 10:00:00 via INTRAVENOUS
  Filled 2018-06-09 (×2): qty 250

## 2018-06-09 MED ORDER — PEGFILGRASTIM-CBQV 6 MG/0.6ML ~~LOC~~ SOSY
PREFILLED_SYRINGE | SUBCUTANEOUS | Status: AC
  Filled 2018-06-09: qty 0.6

## 2018-06-09 MED ORDER — PEGFILGRASTIM-CBQV 6 MG/0.6ML ~~LOC~~ SOSY
6.0000 mg | PREFILLED_SYRINGE | Freq: Once | SUBCUTANEOUS | Status: AC
  Administered 2018-06-09: 11:00:00 6 mg via SUBCUTANEOUS

## 2018-06-09 MED ORDER — HEPARIN SOD (PORK) LOCK FLUSH 100 UNIT/ML IV SOLN
500.0000 [IU] | Freq: Once | INTRAVENOUS | Status: AC
  Administered 2018-06-09: 500 [IU]
  Filled 2018-06-09: qty 5

## 2018-06-09 NOTE — Telephone Encounter (Signed)
Spoke with pt about switching to Neulasta On-Pro. Pt states she would prefer to discuss this change with MD at next office visit before making a decision.

## 2018-06-10 ENCOUNTER — Inpatient Hospital Stay: Payer: BLUE CROSS/BLUE SHIELD

## 2018-06-14 ENCOUNTER — Ambulatory Visit: Payer: BLUE CROSS/BLUE SHIELD | Admitting: Adult Health

## 2018-06-14 ENCOUNTER — Other Ambulatory Visit: Payer: BLUE CROSS/BLUE SHIELD

## 2018-06-14 ENCOUNTER — Ambulatory Visit: Payer: BLUE CROSS/BLUE SHIELD

## 2018-06-16 ENCOUNTER — Telehealth: Payer: Self-pay | Admitting: *Deleted

## 2018-06-16 ENCOUNTER — Ambulatory Visit: Payer: BLUE CROSS/BLUE SHIELD

## 2018-06-16 NOTE — Telephone Encounter (Signed)
Neulasta Onpro Patient Outreach Note  Patient was contacted on 06/15/2018 in regards to switching G-CSF therapy to Neulasta Onpro ([email or web address]). Patient was educated on the purpose of this proposed change in therapy due to COVID-19 pandemic. Patient was educated about Neulasta Onpro on-body injector and patient will be provided with an educational video while in infusion on their next scheduled date if changed to Neulasta Onpro.   [x]  Patient agrees to change in therapy. Begin process to change to Neulasta Onpro therapy.  []  Patient does not agree to change in therapy. No change to Neulasta Onpro at this time.    Thank You,  Caprice Renshaw  06/16/2018 3:12 PM

## 2018-06-20 ENCOUNTER — Other Ambulatory Visit: Payer: Self-pay | Admitting: Oncology

## 2018-06-21 ENCOUNTER — Inpatient Hospital Stay: Payer: BLUE CROSS/BLUE SHIELD

## 2018-06-21 ENCOUNTER — Inpatient Hospital Stay: Payer: BLUE CROSS/BLUE SHIELD | Admitting: Oncology

## 2018-06-23 ENCOUNTER — Inpatient Hospital Stay: Payer: BLUE CROSS/BLUE SHIELD

## 2018-06-27 ENCOUNTER — Telehealth: Payer: Self-pay | Admitting: Adult Health

## 2018-06-27 NOTE — Telephone Encounter (Signed)
Spoke with Dr. Heron Nay to request approval of Onpro for Kathryn [name].  Reviewed the fact that patient has bene persistently neutropenic following her chemotherapy with use of Udenyca despite dose reductions and the fact that we would like to avoid her having repeat appointments in clinic due to the [place] 19 pandemic.  I was told that this was reviewed by a medical oncologist and that they would not approve it because, "Udenyca and Neulasta work the same as they are biosimilars" and that, "she only has one more dose left."    She gave me the choice to withdraw the case so the patient would not receive a denial.  I let her know that I would not withdraw the case, that the patient knows that we recommend the onpro due to the above concerns/medical issues listed and that it was her insurance company's choice to deny it.  When I asked her how to spell her name, her tone became aggressive and she wanted to know why I needed to know her name.  I reviewed that it was for my documentation purposes.  She then spelled her name for me and then before I could respond, she said goodbye and abruptly ended call.    Time spent: 11 minutes.    Wilber Bihari, NP

## 2018-06-27 NOTE — Progress Notes (Signed)
Kathryn [name]  [phone] [phone]    ID: Kathryn [name] DOB: Jul 22, 1960  MR#: [id number]  LNL#:[id number]  Patient Care Team: Kandace Blitz, MD as PCP - General (Obstetrics and Gynecology) Rockwell Germany, RN as Oncology Nurse Navigator Mauro Kaufmann, RN as Oncology Nurse Navigator Erroll Luna, MD as Consulting Physician (General Surgery) Zionah Criswell, Virgie Dad, MD as Consulting Physician (Oncology) Kyung Rudd, MD as Consulting Physician (Radiation Oncology) OTHER MD: Rana Snare (OBGYN)   CHIEF COMPLAINT: Triple negative breast cancer  CURRENT TREATMENT: Neoadjuvant chemotherapy   INTERVAL HISTORY: Kathryn [name] returns today for follow-up and treatment of her triple negative breast cancer.   She continues on neoadjuvant chemotherapy with Doxorubicin and Cyclophosphamide, on day 1 of a 14 day cycle with udenyca on day 3.  However after cycle 2 because of significant side effects and cytopenias we moved her treatments to every 3 weeks.  Today is day 1 of cycle 4.  Also we have obtained OnPro for her for this last cycle.  Since her last visit here, she has not undergone any additional studies.     REVIEW OF SYSTEMS: Kathryn [name] reports doing well overall. She also reports the Cipro caused excessive salivation, which lead her to stop taking it. She states trying to get outside when weather permits. She notes her allergies can prevent her from going out as they cause her eyes to close shut. She uses baby shampoo for relief. She reports constipation with relief from colace. The patient denies unusual headaches, visual changes, nausea, vomiting, stiff neck, dizziness, or gait imbalance. There has been no cough, phlegm production, or pleurisy, no chest pain or pressure, and no change in bowel or bladder habits. The patient denies fever, rash, bleeding, unexplained fatigue or unexplained weight loss. A detailed review of systems was otherwise entirely  negative.   HISTORY OF CURRENT ILLNESS: From the original intake note:  Kathryn [name] presented with a palpable subareolar left breast lump, nipple retraction, and discharge for approximately 1 week. She underwent bilateral diagnostic mammography with tomography and left breast ultrasonography at [facility] on 04/01/2018 showing: Breast Density Category C. An irregular hyperdense mass is seen in the left subareolar region. An additional oval, circumscribed mass is seen in the inferior central aspect at posterior depth. No additional suspicious findings are identified within the remainder of the left breast. On physical exam, there is a 2-3 cm firm, fixed lump in the subareolar region on the left. Subtle skin changes and retraction is noted along the left nipple. Targeted ultrasound is performed, showing an irregular hypoechoic mass with associated vascularity at the 12 o'clock retroareolar position on the left. Overall measurements are 2.6 x 2.3 x 1.2 cm. This corresponds with the mammographic finding. Two adjacent circumscribed hypoechoic masses are identified in the deep 6 o'clock position 2 cm from the nipple. They measure 0.8 x 0.6 x 0.4 cm and 1.3 x 1.1 x 0.7 cm. There is no seated vascularity. This corresponds with the additional mammographic finding and likely represents minimally complicated cyst. Evaluation of the left axilla demonstrates a markedly enlarged abnormal lymph node with complete hilar replacement. It measures up to 5 cm in long axis dimension. No suspicious mammographic findings on the right.   Accordingly on 04/06/2018 she proceeded to biopsy of the left breast area in question. The pathology from this procedure showed ([id number]): invasive ductal carcinoma, grade III, with lymphovascular invasion. Prognostic indicators significant for: estrogen receptor, 0% negative and progesterone receptor, 0% negative. Proliferation  marker Ki67 at 70%. HER2 equivocal (2+) by  immunohistochemistry, but negative by FISH (print3ed report pending).  On the same day she underwent a biopsy of the left axillary lymph node on 04/06/2018 showing ([id number]): metastatic carcinoma in 1 of 1 lymph node (1/1).   The patient's subsequent history is as detailed below.   PAST MEDICAL HISTORY: Past Medical History:  Diagnosis Date   Breast cancer ([place])    stage 3 - left   Family history of kidney cancer    Family history of melanoma      PAST SURGICAL HISTORY: Past Surgical History:  Procedure Laterality Date   CESAREAN SECTION     x3   PORTACATH PLACEMENT N/A 04/28/2018   Procedure: INSERTION PORT-A-CATH WITH ULTRASOUND;  Surgeon: Erroll Luna, MD;  Location: [facility] OR;  Service: General;  Laterality: N/A;     FAMILY HISTORY: Family History  Problem Relation Age of Onset   Kidney cancer Sister 14       d. 69   Lung cancer Paternal Uncle    Melanoma Sister    Melanoma Sister    Kathryn [name]'s father died from congestive heart failure at age 98. Patients' mother is 15 as of 03/2018. The patient has 1 brother and 3 sisters. Patient denies anyone in her family having breast, ovarian, prostate, or pancreatic cancer. Kathryn [name]'s sister, Kathryn [name], was diagnosed with Kidney Cancer at 46. Kathryn [name] has an uncle and a cousin that were diagnosed with lung cancer, but they were both heavy smokers.    GYNECOLOGIC HISTORY:  No LMP recorded. Patient is postmenopausal. 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 Her husband, Kathryn [name], owns [employer]. Together, they have three children, Kathryn [name], Kathryn [name], and Kathryn [name]. 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 Kathryn [name] is 33, lives in [place], and is a  Equities trader at [employer]. 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   ADVANCED DIRECTIVES: Kathryn [name]'s husband, Kathryn [name], is automatically her healthcare power of attorney.    HEALTH MAINTENANCE: Social History   Tobacco Use   Smoking status: Never Smoker   Smokeless tobacco: Never Used  Substance Use Topics   Alcohol use: Yes    Comment: socially   Drug use: Never    Colonoscopy: no  PAP: 2015  Bone density: no   No Known Allergies  Current Outpatient Medications  Medication Sig Dispense Refill   ciprofloxacin (CIPRO) 500 MG tablet Take 1 tablet (500 mg total) by mouth 2 (two) times daily. 40 tablet 1   dexamethasone (DECADRON) 4 MG tablet Take 2 tablets by mouth once a day on the day after chemotherapy and then take 2 tablets two times a day for 2 days. Take with food. 30 tablet 1   Docusate Sodium (COLACE PO) Take by mouth as needed.     ibuprofen (ADVIL,MOTRIN) 800 MG tablet Take 1 tablet (800 mg total) by mouth every 8 (eight) hours as needed. 30 tablet 0   lidocaine-prilocaine (EMLA) cream Apply to affected area once 30 g 3   loratadine (CLARITIN) 10 MG tablet Take 1 tablet (10 mg total) by mouth daily.     LORazepam (ATIVAN) 0.5 MG tablet  Take 1 tablet (0.5 mg total) by mouth at bedtime as needed (Nausea or vomiting). 30 tablet 0   magic mouthwash w/lidocaine SOLN swish, gargle & spit 5-10 MILLILITER EVERY 6 HOURS AS NEEDED     omeprazole (PRILOSEC) 20 MG capsule Take 1 capsule (20 mg total) by mouth daily as needed. 30 capsule 3   ondansetron (ZOFRAN) 8 MG tablet Take 0.5-1 tablets (4-8 mg total) by mouth 3 (three) times daily as needed for nausea or vomiting. 20 tablet 0   No current facility-administered medications for this visit.      OBJECTIVE: Middle-aged white woman who appears stated age 37:   06/28/18 1205  BP: (!) 121/59  Pulse: (!) 115  Resp: 18  Temp: 97.9 F  (36.6 C)  SpO2: 98%     Body mass index is 25.8 kg/m.   Wt Readings from Last 3 Encounters:  06/28/18 150 lb 4.8 oz (68.2 kg)  06/07/18 152 lb 9.6 oz (69.2 kg)  05/24/18 151 lb 14.4 oz (68.9 kg)  ECOG FS:1  Sclerae unicteric, pupils round and equal Wearing a mask No cervical or supraclavicular adenopathy Lungs no rales or rhonchi Heart regular rate and rhythm Abd soft, nontender, positive bowel sounds MSK no focal spinal tenderness, no upper extremity lymphedema Neuro: nonfocal, well oriented, appropriate affect Breasts: The right breast is unremarkable.  I do not palpate a mass in the left breast.  The left nipple is minimally inverted, with a little bit of scaliness at the tip.  Both axillae are benign.     LAB RESULTS:  CMP     Component Value Date/Time   NA 140 06/28/2018 1136   K 3.9 06/28/2018 1136   CL 106 06/28/2018 1136   CO2 24 06/28/2018 1136   GLUCOSE 103 (H) 06/28/2018 1136   BUN 9 06/28/2018 1136   CREATININE 0.68 06/28/2018 1136   CREATININE 0.83 04/13/2018 0843   CALCIUM 9.1 06/28/2018 1136   PROT 6.7 06/28/2018 1136   ALBUMIN 3.4 (L) 06/28/2018 1136   AST 23 06/28/2018 1136   AST 15 04/13/2018 0843   ALT 29 06/28/2018 1136   ALT 16 04/13/2018 0843   ALKPHOS 106 06/28/2018 1136   BILITOT 0.3 06/28/2018 1136   BILITOT 0.5 04/13/2018 0843   GFRNONAA >60 06/28/2018 1136   GFRNONAA >60 04/13/2018 0843   GFRAA >60 06/28/2018 1136   GFRAA >60 04/13/2018 0843    No results found for: TOTALPROTELP, ALBUMINELP, A1GS, A2GS, BETS, BETA2SER, GAMS, MSPIKE, SPEI  No results found for: KPAFRELGTCHN, LAMBDASER, KAPLAMBRATIO  Lab Results  Component Value Date   WBC 5.6 06/28/2018   NEUTROABS 3.9 06/28/2018   HGB 10.6 (L) 06/28/2018   HCT 33.4 (L) 06/28/2018   MCV 94.4 06/28/2018   PLT 305 06/28/2018    '@LASTCHEMISTRY' @  No results found for: LABCA2  No components found for: [id number]  No results for input(s): INR in the last 168 hours.  No  results found for: LABCA2  No results found for: PHK327  No results found for: MDY709  No results found for: KHV747  No results found for: CA2729  No components found for: HGQUANT  No results found for: CEA1 / No results found for: CEA1   No results found for: AFPTUMOR  No results found for: CHROMOGRNA  No results found for: PSA1  Appointment on 06/28/2018  Component Date Value Ref Range Status   Sodium 06/28/2018 140  135 - 145 mmol/L Final   Potassium 06/28/2018 3.9  3.5 -  5.1 mmol/L Final   Chloride 06/28/2018 106  98 - 111 mmol/L Final   CO2 06/28/2018 24  22 - 32 mmol/L Final   Glucose, Bld 06/28/2018 103* 70 - 99 mg/dL Final   BUN 06/28/2018 9  6 - 20 mg/dL Final   Creatinine, Ser 06/28/2018 0.68  0.44 - 1.00 mg/dL Final   Calcium 06/28/2018 9.1  8.9 - 10.3 mg/dL Final   Total Protein 06/28/2018 6.7  6.5 - 8.1 g/dL Final   Albumin 06/28/2018 3.4* 3.5 - 5.0 g/dL Final   AST 06/28/2018 23  15 - 41 U/L Final   ALT 06/28/2018 29  0 - 44 U/L Final   Alkaline Phosphatase 06/28/2018 106  38 - 126 U/L Final   Total Bilirubin 06/28/2018 0.3  0.3 - 1.2 mg/dL Final   GFR calc non Af Amer 06/28/2018 >60  >60 mL/min Final   GFR calc Af Amer 06/28/2018 >60  >60 mL/min Final   Anion gap 06/28/2018 10  5 - 15 Final   Performed at [facility], [place] [address], [place], [place] [address]   WBC 06/28/2018 5.6  4.0 - 10.5 K/uL Final   RBC 06/28/2018 3.54* 3.87 - 5.11 MIL/uL Final   Hemoglobin 06/28/2018 10.6* 12.0 - 15.0 g/dL Final   HCT 06/28/2018 33.4* 36.0 - 46.0 % Final   MCV 06/28/2018 94.4  80.0 - 100.0 fL Final   MCH 06/28/2018 29.9  26.0 - 34.0 pg Final   MCHC 06/28/2018 31.7  30.0 - 36.0 g/dL Final   RDW 06/28/2018 17.8* 11.5 - 15.5 % Final   Platelets 06/28/2018 305  150 - 400 K/uL Final   nRBC 06/28/2018 0.0  0.0 - 0.2 % Final   Neutrophils Relative % 06/28/2018 70  % Final   Neutro Abs 06/28/2018 3.9  1.7 - 7.7  K/uL Final   Lymphocytes Relative 06/28/2018 10  % Final   Lymphs Abs 06/28/2018 0.6* 0.7 - 4.0 K/uL Final   Monocytes Relative 06/28/2018 17  % Final   Monocytes Absolute 06/28/2018 0.9  0.1 - 1.0 K/uL Final   Eosinophils Relative 06/28/2018 0  % Final   Eosinophils Absolute 06/28/2018 0.0  0.0 - 0.5 K/uL Final   Basophils Relative 06/28/2018 2  % Final   Basophils Absolute 06/28/2018 0.1  0.0 - 0.1 K/uL Final   Immature Granulocytes 06/28/2018 1  % Final   Abs Immature Granulocytes 06/28/2018 0.04  0.00 - 0.07 K/uL Final   Performed at [facility], [place] [address], [place], [place] [address]    (this displays the last labs from the last 3 days)  No results found for: TOTALPROTELP, ALBUMINELP, A1GS, A2GS, BETS, BETA2SER, GAMS, MSPIKE, SPEI (this displays SPEP labs)  No results found for: KPAFRELGTCHN, LAMBDASER, KAPLAMBRATIO (kappa/lambda light chains)  No results found for: HGBA, HGBA2QUANT, HGBFQUANT, HGBSQUAN (Hemoglobinopathy evaluation)   No results found for: LDH  No results found for: IRON, TIBC, IRONPCTSAT (Iron and TIBC)  No results found for: FERRITIN  Urinalysis No results found for: COLORURINE, APPEARANCEUR, LABSPEC, PHURINE, GLUCOSEU, HGBUR, BILIRUBINUR, KETONESUR, PROTEINUR, UROBILINOGEN, NITRITE, LEUKOCYTESUR   STUDIES:  No results found.  ELIGIBLE FOR AVAILABLE RESEARCH PROTOCOL: [id number]   ASSESSMENT: 59 y.o. [place], [place] woman status post left breast upper outer quadrant biopsy 04/06/2018 for a clinical T2 N2, stage IIIC invasive ductal carcinoma, grade 3, triple negative, with an MIB-1 of 70%  (a) breast MRI 04/19/2018 shows a 5 cm central breast lesion with multiple satellite  nodules and greater then 3 abnormal axillary lymph nodes  (b) baseline echocardiogram 04/25/2018 shows an ejection fraction in the 60-65% range  (c) chest CT scan and bone scan showed no metastatic disease.  Nonspecific rib changes will need to be  followed  (1) neoadjuvant chemotherapy will consist of doxorubicin and cyclophosphamide in dose dense fashion x4, starting 05/03/2018, to be followed by weekly paclitaxel and carboplatin x12   (a) Doxorubicin and Cyclophosphamide dose reduced for cycles 3 and 4 due to neutropenia  (b) dose dense changed to every 3 weeks for doxorubicin/cyclophosphamide secondary to cytopenias and side effects  (2) definitive surgery to follow  (3) adjuvant radiation after surgery  (4) genetics testing 04/27/2018   PLAN: Kathryn [name] will complete the first part of her chemotherapy today.  This is of course the more intense part.  There have been some delays in dose reductions but overall she has gotten it done which is the most important thing.  She had very unusual side effects from the ciprofloxacin which we are stopping.  Instead she will take Zithromax, beginning a week from today.  She has taken this in the past with good tolerance.  We are going to do cryo prevention and she understands she will need to get some of the containers to hold the ice when she returns 3 weeks from now  I have encouraged her to increase her exercise tolerance if possible  They are taking appropriate pandemic precautions  She knows to call for any other issue that may develop before her next treatment.  Craig Wisnewski, Virgie Dad, MD  06/28/18 12:29 PM Medical Oncology and Hematology [facility] [address] [place], [place] [address] Tel. [phone]    Fax. [phone]   I, Wilburn Mylar, am acting as scribe for Dr. Virgie Dad. Joylene Wescott.  I, Lurline Del MD, have reviewed the above documentation for accuracy and completeness, and I agree with the above.

## 2018-06-28 ENCOUNTER — Inpatient Hospital Stay: Payer: BC Managed Care – PPO | Attending: Oncology

## 2018-06-28 ENCOUNTER — Inpatient Hospital Stay (HOSPITAL_BASED_OUTPATIENT_CLINIC_OR_DEPARTMENT_OTHER): Payer: BC Managed Care – PPO | Admitting: Oncology

## 2018-06-28 ENCOUNTER — Other Ambulatory Visit: Payer: Self-pay

## 2018-06-28 ENCOUNTER — Inpatient Hospital Stay: Payer: BC Managed Care – PPO

## 2018-06-28 ENCOUNTER — Telehealth: Payer: Self-pay | Admitting: *Deleted

## 2018-06-28 VITALS — BP 121/59 | HR 115 | Temp 97.9°F | Resp 18 | Ht 64.0 in | Wt 150.3 lb

## 2018-06-28 VITALS — HR 89

## 2018-06-28 DIAGNOSIS — Z5111 Encounter for antineoplastic chemotherapy: Secondary | ICD-10-CM

## 2018-06-28 DIAGNOSIS — Z8051 Family history of malignant neoplasm of kidney: Secondary | ICD-10-CM

## 2018-06-28 DIAGNOSIS — Z95828 Presence of other vascular implants and grafts: Secondary | ICD-10-CM

## 2018-06-28 DIAGNOSIS — Z171 Estrogen receptor negative status [ER-]: Principal | ICD-10-CM

## 2018-06-28 DIAGNOSIS — C50412 Malignant neoplasm of upper-outer quadrant of left female breast: Secondary | ICD-10-CM

## 2018-06-28 DIAGNOSIS — Z7689 Persons encountering health services in other specified circumstances: Secondary | ICD-10-CM | POA: Diagnosis not present

## 2018-06-28 DIAGNOSIS — Z803 Family history of malignant neoplasm of breast: Secondary | ICD-10-CM | POA: Diagnosis not present

## 2018-06-28 DIAGNOSIS — Z808 Family history of malignant neoplasm of other organs or systems: Secondary | ICD-10-CM | POA: Insufficient documentation

## 2018-06-28 DIAGNOSIS — Z79899 Other long term (current) drug therapy: Secondary | ICD-10-CM

## 2018-06-28 DIAGNOSIS — C773 Secondary and unspecified malignant neoplasm of axilla and upper limb lymph nodes: Secondary | ICD-10-CM | POA: Insufficient documentation

## 2018-06-28 DIAGNOSIS — K5900 Constipation, unspecified: Secondary | ICD-10-CM | POA: Diagnosis not present

## 2018-06-28 LAB — COMPREHENSIVE METABOLIC PANEL
ALT: 29 U/L (ref 0–44)
AST: 23 U/L (ref 15–41)
Albumin: 3.4 g/dL — ABNORMAL LOW (ref 3.5–5.0)
Alkaline Phosphatase: 106 U/L (ref 38–126)
Anion gap: 10 (ref 5–15)
BUN: 9 mg/dL (ref 6–20)
CO2: 24 mmol/L (ref 22–32)
Calcium: 9.1 mg/dL (ref 8.9–10.3)
Chloride: 106 mmol/L (ref 98–111)
Creatinine, Ser: 0.68 mg/dL (ref 0.44–1.00)
GFR calc Af Amer: >60 mL/min (ref >60)
GFR calc non Af Amer: >60 mL/min (ref >60)
Glucose, Bld: 103 mg/dL — ABNORMAL HIGH (ref 70–99)
Potassium: 3.9 mmol/L (ref 3.5–5.1)
Sodium: 140 mmol/L (ref 135–145)
Total Bilirubin: 0.3 mg/dL (ref 0.3–1.2)
Total Protein: 6.7 g/dL (ref 6.5–8.1)

## 2018-06-28 LAB — CBC WITH DIFFERENTIAL/PLATELET
Abs Immature Granulocytes: 0.04 10*3/uL (ref 0.00–0.07)
Basophils Absolute: 0.1 10*3/uL (ref 0.0–0.1)
Basophils Relative: 2 %
Eosinophils Absolute: 0 10*3/uL (ref 0.0–0.5)
Eosinophils Relative: 0 %
HCT: 33.4 % — ABNORMAL LOW (ref 36.0–46.0)
Hemoglobin: 10.6 g/dL — ABNORMAL LOW (ref 12.0–15.0)
Immature Granulocytes: 1 %
Lymphocytes Relative: 10 %
Lymphs Abs: 0.6 10*3/uL — ABNORMAL LOW (ref 0.7–4.0)
MCH: 29.9 pg (ref 26.0–34.0)
MCHC: 31.7 g/dL (ref 30.0–36.0)
MCV: 94.4 fL (ref 80.0–100.0)
Monocytes Absolute: 0.9 10*3/uL (ref 0.1–1.0)
Monocytes Relative: 17 %
Neutro Abs: 3.9 10*3/uL (ref 1.7–7.7)
Neutrophils Relative %: 70 %
Platelets: 305 10*3/uL (ref 150–400)
RBC: 3.54 MIL/uL — ABNORMAL LOW (ref 3.87–5.11)
RDW: 17.8 % — ABNORMAL HIGH (ref 11.5–15.5)
WBC: 5.6 10*3/uL (ref 4.0–10.5)
nRBC: 0 % (ref 0.0–0.2)

## 2018-06-28 MED ORDER — PEGFILGRASTIM 6 MG/0.6ML ~~LOC~~ PSKT
PREFILLED_SYRINGE | SUBCUTANEOUS | Status: AC
  Filled 2018-06-28: qty 0.6

## 2018-06-28 MED ORDER — HEPARIN SOD (PORK) LOCK FLUSH 100 UNIT/ML IV SOLN
500.0000 [IU] | Freq: Once | INTRAVENOUS | Status: AC | PRN
  Administered 2018-06-28: 15:00:00 500 [IU]
  Filled 2018-06-28: qty 5

## 2018-06-28 MED ORDER — SODIUM CHLORIDE 0.9 % IV SOLN
Freq: Once | INTRAVENOUS | Status: AC
  Administered 2018-06-28: 13:00:00 via INTRAVENOUS
  Filled 2018-06-28: qty 250

## 2018-06-28 MED ORDER — SODIUM CHLORIDE 0.9% FLUSH
10.0000 mL | Freq: Once | INTRAVENOUS | Status: AC
  Administered 2018-06-28: 10 mL
  Filled 2018-06-28: qty 10

## 2018-06-28 MED ORDER — DOXORUBICIN HCL CHEMO IV INJECTION 2 MG/ML
50.0000 mg/m2 | Freq: Once | INTRAVENOUS | Status: AC
  Administered 2018-06-28: 15:00:00 90 mg via INTRAVENOUS
  Filled 2018-06-28: qty 45

## 2018-06-28 MED ORDER — AZITHROMYCIN 250 MG PO TABS: ORAL_TABLET | ORAL | 0 refills | Status: DC

## 2018-06-28 MED ORDER — SODIUM CHLORIDE 0.9 % IV SOLN
Freq: Once | INTRAVENOUS | Status: AC
  Administered 2018-06-28: 14:00:00 via INTRAVENOUS
  Filled 2018-06-28: qty 5

## 2018-06-28 MED ORDER — SODIUM CHLORIDE 0.9% FLUSH
10.0000 mL | INTRAVENOUS | Status: DC | PRN
  Administered 2018-06-28: 15:00:00 10 mL
  Filled 2018-06-28: qty 10

## 2018-06-28 MED ORDER — SODIUM CHLORIDE 0.9 % IV SOLN
500.0000 mg/m2 | Freq: Once | INTRAVENOUS | Status: AC
  Administered 2018-06-28: 15:00:00 900 mg via INTRAVENOUS
  Filled 2018-06-28: qty 45

## 2018-06-28 MED ORDER — PEGFILGRASTIM 6 MG/0.6ML ~~LOC~~ PSKT
6.0000 mg | PREFILLED_SYRINGE | Freq: Once | SUBCUTANEOUS | Status: AC
  Administered 2018-06-28: 15:00:00 6 mg via SUBCUTANEOUS

## 2018-06-28 NOTE — Telephone Encounter (Signed)
Called patient to check in to see how she is tolerating her chemo.  Unable to leave voicemail because it was full.

## 2018-06-28 NOTE — Patient Instructions (Signed)
[facility] Discharge Instructions for Patients Receiving Chemotherapy  Today you received the following chemotherapy agents Doxorubicin (ADRIAMYCIN) & Cyclophosphamide (CYTOXAN).  To help prevent nausea and vomiting after your treatment, we encourage you to take your nausea medication as prescribed.   If you develop nausea and vomiting that is not controlled by your nausea medication, call the clinic.   BELOW ARE SYMPTOMS THAT SHOULD BE REPORTED IMMEDIATELY:  *FEVER GREATER THAN 100.5 F  *CHILLS WITH OR WITHOUT FEVER  NAUSEA AND VOMITING THAT IS NOT CONTROLLED WITH YOUR NAUSEA MEDICATION  *UNUSUAL SHORTNESS OF BREATH  *UNUSUAL BRUISING OR BLEEDING  TENDERNESS IN MOUTH AND THROAT WITH OR WITHOUT PRESENCE OF ULCERS  *URINARY PROBLEMS  *BOWEL PROBLEMS  UNUSUAL RASH Items with * indicate a potential emergency and should be followed up as soon as possible.  Feel free to call the clinic should you have any questions or concerns. The clinic phone number is [phone].  Please show the CHEMO ALERT CARD at check-in to the Emergency Department and triage nurse.  Coronavirus (COVID-19) Are you at risk?  Are you at risk for the Coronavirus (COVID-19)?  To be considered HIGH RISK for Coronavirus (COVID-19), you have to meet the following criteria:  . Traveled to [place], [place], [place], [place] or [place]; or in the [place] to [place], [place], [place], or [place]; and have fever, cough, and shortness of breath within the last 2 weeks of travel OR . Been in close contact with a person diagnosed with COVID-19 within the last 2 weeks and have fever, cough, and shortness of breath . IF YOU DO NOT MEET THESE CRITERIA, YOU ARE CONSIDERED LOW RISK FOR COVID-19.  What to do if you are HIGH RISK for COVID-19?  . If you are having a medical emergency, call 911. . Seek medical care right away. Before you go to a doctor's office, urgent care or emergency  department, call ahead and tell them about your recent travel, contact with someone diagnosed with COVID-19, and your symptoms. You should receive instructions from your physician's office regarding next steps of care.  . When you arrive at healthcare provider, tell the healthcare staff immediately you have returned from visiting [place], [place], [place], [place] or [place]; or traveled in the [place] to [place], [place], [place], or [place]; in the last two weeks or you have been in close contact with a person diagnosed with COVID-19 in the last 2 weeks.   . Tell the health care staff about your symptoms: fever, cough and shortness of breath. . After you have been seen by a medical provider, you will be either: o Tested for (COVID-19) and discharged home on quarantine except to seek medical care if symptoms worsen, and asked to  - Stay home and avoid contact with others until you get your results (4-5 days)  - Avoid travel on public transportation if possible (such as bus, train, or airplane) or o Sent to the Emergency Department by EMS for evaluation, COVID-19 testing, and possible admission depending on your condition and test results.  What to do if you are LOW RISK for COVID-19?  Reduce your risk of any infection by using the same precautions used for avoiding the common cold or flu:  . Wash your hands often with soap and warm water for at least 20 seconds.  If soap and water are not readily available, use an alcohol-based hand sanitizer with at least 60% alcohol.  .   If coughing or sneezing, cover your mouth and nose by coughing or sneezing into the elbow areas of your shirt or coat, into a tissue or into your sleeve (not your hands). . Avoid shaking hands with others and consider head nods or verbal greetings only. . Avoid touching your eyes, nose, or mouth with unwashed hands.  . Avoid close contact with people who are sick. . Avoid places or events with large numbers of people  in one location, like concerts or sporting events. . Carefully consider travel plans you have or are making. . If you are planning any travel outside or inside the [place], visit the CDC's Travelers' Health webpage for the latest health notices. . If you have some symptoms but not all symptoms, continue to monitor at home and seek medical attention if your symptoms worsen. . If you are having a medical emergency, call 911.   [place] / e-Visit: [email or web address]         [facility]: [facility]: [phone]                   [facility]: [phone]

## 2018-06-28 NOTE — Progress Notes (Signed)
PA approved for one dose Onpro per PA referral note. Kennith Center, Pharm.D., CPP 06/28/2018@9 :34 AM

## 2018-06-29 ENCOUNTER — Other Ambulatory Visit: Payer: Self-pay | Admitting: Oncology

## 2018-06-29 ENCOUNTER — Telehealth: Payer: Self-pay | Admitting: Oncology

## 2018-06-29 DIAGNOSIS — C50412 Malignant neoplasm of upper-outer quadrant of left female breast: Secondary | ICD-10-CM

## 2018-06-29 DIAGNOSIS — Z171 Estrogen receptor negative status [ER-]: Principal | ICD-10-CM

## 2018-06-29 NOTE — Telephone Encounter (Signed)
Tried to reach regarding schedule °

## 2018-07-05 ENCOUNTER — Inpatient Hospital Stay: Payer: BC Managed Care – PPO | Admitting: Adult Health

## 2018-07-05 ENCOUNTER — Inpatient Hospital Stay: Payer: BC Managed Care – PPO

## 2018-07-05 ENCOUNTER — Ambulatory Visit: Payer: BLUE CROSS/BLUE SHIELD

## 2018-07-12 ENCOUNTER — Other Ambulatory Visit: Payer: Self-pay | Admitting: *Deleted

## 2018-07-14 ENCOUNTER — Telehealth: Payer: Self-pay

## 2018-07-14 NOTE — Telephone Encounter (Signed)
Called patients pharmacy to have magic mouth wash refilled x 1.  Ok per NP.

## 2018-07-19 NOTE — Progress Notes (Signed)
[place]  [phone] [phone]    ID: Kathryn [name] DOB: 1960-04-12  MR#: [id number]  JJH#:[id number]  Patient Care Team: Kandace Blitz, MD as PCP - General (Obstetrics and Gynecology) Rockwell Germany, RN as Oncology Nurse Navigator Mauro Kaufmann, RN as Oncology Nurse Navigator Erroll Luna, MD as Consulting Physician (General Surgery) Emika Tiano, Virgie Dad, MD as Consulting Physician (Oncology) Kyung Rudd, MD as Consulting Physician (Radiation Oncology) OTHER MD: Rana Snare (OBGYN)   CHIEF COMPLAINT: Triple negative breast cancer  CURRENT TREATMENT: Neoadjuvant chemotherapy   INTERVAL HISTORY: Kathryn [name] returns today for follow-up and treatment of her triple negative breast cancer.   She continues on neoadjuvant chemotherapy.  She completed 4 cycles of Doxorubicin and Cyclophosphamide on 06/28/2018, and today will receive her first of 12 planned doses of paclitaxel and carboplatin. She reports the last one made her feel awful. She experienced severe fatigue, dry heaving, mouth sores, and constipation. She is recovered this past week. She reports coffee and soft drinks don't taste good anymore, and milk tastes good, however her sense of taste is still intact.  Since her last visit here, she has not undergone any additional studies.     REVIEW OF SYSTEMS: Chardae reports doing better this past week. She notes her family is also doing well. She states she went out for ice cream at [employer] with her daughter and mother-in-law. She reports a friend went to the beach for [place] Day weekend and then wanted to see Blanca, which she refused for virus concerns. The patient denies unusual headaches, visual changes, nausea, vomiting, stiff neck, dizziness, or gait imbalance. There has been no cough, phlegm production, or pleurisy, no chest pain or pressure, and no change in bowel or bladder habits. The patient denies fever, rash, bleeding,  unexplained fatigue or unexplained weight loss. A detailed review of systems was otherwise entirely negative.   HISTORY OF CURRENT ILLNESS: From the original intake note:  Kathryn [name] presented with a palpable subareolar left breast lump, nipple retraction, and discharge for approximately 1 week. She underwent bilateral diagnostic mammography with tomography and left breast ultrasonography at [facility] on 04/01/2018 showing: Breast Density Category C. An irregular hyperdense mass is seen in the left subareolar region. An additional oval, circumscribed mass is seen in the inferior central aspect at posterior depth. No additional suspicious findings are identified within the remainder of the left breast. On physical exam, there is a 2-3 cm firm, fixed lump in the subareolar region on the left. Subtle skin changes and retraction is noted along the left nipple. Targeted ultrasound is performed, showing an irregular hypoechoic mass with associated vascularity at the 12 o'clock retroareolar position on the left. Overall measurements are 2.6 x 2.3 x 1.2 cm. This corresponds with the mammographic finding. Two adjacent circumscribed hypoechoic masses are identified in the deep 6 o'clock position 2 cm from the nipple. They measure 0.8 x 0.6 x 0.4 cm and 1.3 x 1.1 x 0.7 cm. There is no seated vascularity. This corresponds with the additional mammographic finding and likely represents minimally complicated cyst. Evaluation of the left axilla demonstrates a markedly enlarged abnormal lymph node with complete hilar replacement. It measures up to 5 cm in long axis dimension. No suspicious mammographic findings on the right.   Accordingly on 04/06/2018 she proceeded to biopsy of the left breast area in question. The pathology from this procedure showed ([id number]): invasive ductal carcinoma, grade III, with lymphovascular invasion. Prognostic indicators significant for:  estrogen receptor, 0% negative and  progesterone receptor, 0% negative. Proliferation marker Ki67 at 70%. HER2 equivocal (2+) by immunohistochemistry, but negative by FISH (print3ed report pending).  On the same day she underwent a biopsy of the left axillary lymph node on 04/06/2018 showing ([id number]): metastatic carcinoma in 1 of 1 lymph node (1/1).   The patient's subsequent history is as detailed below.   PAST MEDICAL HISTORY: Past Medical History:  Diagnosis Date   Breast cancer ([place])    stage 3 - left   Family history of kidney cancer    Family history of melanoma      PAST SURGICAL HISTORY: Past Surgical History:  Procedure Laterality Date   CESAREAN SECTION     x3   PORTACATH PLACEMENT N/A 04/28/2018   Procedure: INSERTION PORT-A-CATH WITH ULTRASOUND;  Surgeon: Erroll Luna, MD;  Location: [facility] OR;  Service: General;  Laterality: N/A;     FAMILY HISTORY: Family History  Problem Relation Age of Onset   Kidney cancer Sister 62       d. 50   Lung cancer Paternal Uncle    Melanoma Sister    Melanoma Sister    Ragan's father died from congestive heart failure at age 47. Patients' mother is 69 as of 03/2018. The patient has 1 brother and 3 sisters. Patient denies anyone in her family having breast, ovarian, prostate, or pancreatic cancer. Banessa's sister, Kathryn [name], was diagnosed with Kidney Cancer at 38. Kathryn [name] has an uncle and a cousin that were diagnosed with lung cancer, but they were both heavy smokers.    GYNECOLOGIC HISTORY:  No LMP recorded. Patient is postmenopausal. 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 Her husband, Donzella Carrol, owns [employer]. Together, they have three children, [name], [place], and [place]. Kathryn [name] is 11, lives in [place], and works as a Theme park manager  for the [employer]. Kathryn [name] is 11, lives in [place], and is a Equities trader at [employer]. Kathryn [name] is 5, is a Ship broker, and lives at home with [name] and [name].   ADVANCED DIRECTIVES: Arneta's husband, Mehar Sagen, is automatically her healthcare power of attorney.    HEALTH MAINTENANCE: Social History   Tobacco Use   Smoking status: Never Smoker   Smokeless tobacco: Never Used  Substance Use Topics   Alcohol use: Yes    Comment: socially   Drug use: Never    Colonoscopy: no  PAP: 2015  Bone density: no   No Known Allergies  Current Outpatient Medications  Medication Sig Dispense Refill   azithromycin (ZITHROMAX) 250 MG tablet Take 2 tablets day 1, then 1 tablet daily 6 each 0   dexamethasone (DECADRON) 4 MG tablet Take 2 tablets by mouth once a day on the day after chemotherapy and then take 2 tablets two times a day for 2 days. Take with food. 30 tablet 1   Docusate Sodium (COLACE PO) Take by mouth as needed.     ibuprofen (ADVIL,MOTRIN) 800 MG tablet Take 1 tablet (800 mg total) by mouth every 8 (eight) hours as needed. 30 tablet 0   lidocaine-prilocaine (EMLA) cream Apply to affected area once 30 g 3   loratadine (CLARITIN) 10 MG tablet Take 1 tablet (10 mg total) by mouth daily.  LORazepam (ATIVAN) 0.5 MG tablet Take 1 tablet (0.5 mg total) by mouth at bedtime as needed (Nausea or vomiting). 30 tablet 0   magic mouthwash w/lidocaine SOLN swish, gargle & spit 5-10 MILLILITER EVERY 6 HOURS AS NEEDED     omeprazole (PRILOSEC) 20 MG capsule Take 1 capsule (20 mg total) by mouth daily as needed. 30 capsule 3   ondansetron (ZOFRAN) 8 MG tablet Take 0.5-1 tablets (4-8 mg total) by mouth 3 (three) times daily as needed for nausea or vomiting. 20 tablet 0   No current facility-administered medications for this visit.      OBJECTIVE: Middle-aged white woman in no acute distress Vitals:    07/20/18 1038  BP: 107/70  Pulse: 100  Resp: 18  Temp: 97.8 F (36.6 C)  SpO2: 100%     Body mass index is 25.94 kg/m.   Wt Readings from Last 3 Encounters:  07/20/18 151 lb 1.6 oz (68.5 kg)  06/28/18 150 lb 4.8 oz (68.2 kg)  06/07/18 152 lb 9.6 oz (69.2 kg)  ECOG FS:1  Sclerae unicteric, EOMs intact No cervical or supraclavicular adenopathy Lungs no rales or rhonchi Heart regular rate and rhythm Abd soft, nontender, positive bowel sounds MSK no focal spinal tenderness, no upper extremity lymphedema Neuro: nonfocal, well oriented, appropriate affect Breasts: Right breast is unremarkable.  I do not palpate a well-defined mass in the left breast, which is very favorable.  There is still mild nipple inversion.  There is no erythema.  Both axillae are benign.        LAB RESULTS:  CMP     Component Value Date/Time   NA 140 06/28/2018 1136   K 3.9 06/28/2018 1136   CL 106 06/28/2018 1136   CO2 24 06/28/2018 1136   GLUCOSE 103 (H) 06/28/2018 1136   BUN 9 06/28/2018 1136   CREATININE 0.68 06/28/2018 1136   CREATININE 0.83 04/13/2018 0843   CALCIUM 9.1 06/28/2018 1136   PROT 6.7 06/28/2018 1136   ALBUMIN 3.4 (L) 06/28/2018 1136   AST 23 06/28/2018 1136   AST 15 04/13/2018 0843   ALT 29 06/28/2018 1136   ALT 16 04/13/2018 0843   ALKPHOS 106 06/28/2018 1136   BILITOT 0.3 06/28/2018 1136   BILITOT 0.5 04/13/2018 0843   GFRNONAA >60 06/28/2018 1136   GFRNONAA >60 04/13/2018 0843   GFRAA >60 06/28/2018 1136   GFRAA >60 04/13/2018 0843    No results found for: TOTALPROTELP, ALBUMINELP, A1GS, A2GS, BETS, BETA2SER, GAMS, MSPIKE, SPEI  No results found for: KPAFRELGTCHN, LAMBDASER, KAPLAMBRATIO  Lab Results  Component Value Date   WBC 4.0 07/20/2018   NEUTROABS 2.3 07/20/2018   HGB 9.1 (L) 07/20/2018   HCT 29.6 (L) 07/20/2018   MCV 102.1 (H) 07/20/2018   PLT 391 07/20/2018    _0 @  No results found for: LABCA2  No components found for:  PPIRJJ884  No results for input(s): INR in the last 168 hours.  No results found for: LABCA2  No results found for: ZYS063  No results found for: KZS010  No results found for: XNA355  No results found for: CA2729  No components found for: HGQUANT  No results found for: CEA1 / No results found for: CEA1   No results found for: AFPTUMOR  No results found for: CHROMOGRNA  No results found for: PSA1  Appointment on 07/20/2018  Component Date Value Ref Range Status   WBC 07/20/2018 4.0  4.0 - 10.5 K/uL Final   RBC 07/20/2018 2.90* 3.87 -  5.11 MIL/uL Final   Hemoglobin 07/20/2018 9.1* 12.0 - 15.0 g/dL Final   HCT 07/20/2018 29.6* 36.0 - 46.0 % Final   MCV 07/20/2018 102.1* 80.0 - 100.0 fL Final   MCH 07/20/2018 31.4  26.0 - 34.0 pg Final   MCHC 07/20/2018 30.7  30.0 - 36.0 g/dL Final   RDW 07/20/2018 20.1* 11.5 - 15.5 % Final   Platelets 07/20/2018 391  150 - 400 K/uL Final   nRBC 07/20/2018 0.0  0.0 - 0.2 % Final   Neutrophils Relative % 07/20/2018 57  % Final   Neutro Abs 07/20/2018 2.3  1.7 - 7.7 K/uL Final   Lymphocytes Relative 07/20/2018 21  % Final   Lymphs Abs 07/20/2018 0.8  0.7 - 4.0 K/uL Final   Monocytes Relative 07/20/2018 18  % Final   Monocytes Absolute 07/20/2018 0.7  0.1 - 1.0 K/uL Final   Eosinophils Relative 07/20/2018 0  % Final   Eosinophils Absolute 07/20/2018 0.0  0.0 - 0.5 K/uL Final   Basophils Relative 07/20/2018 2  % Final   Basophils Absolute 07/20/2018 0.1  0.0 - 0.1 K/uL Final   Immature Granulocytes 07/20/2018 2  % Final   Abs Immature Granulocytes 07/20/2018 0.07  0.00 - 0.07 K/uL Final   Performed at [facility], [place] [address] [place], [place] [address]    (this displays the last labs from the last 3 days)  No results found for: TOTALPROTELP, ALBUMINELP, A1GS, A2GS, BETS, BETA2SER, GAMS, MSPIKE, SPEI (this displays SPEP labs)  No results found for: KPAFRELGTCHN, LAMBDASER,  KAPLAMBRATIO (kappa/lambda light chains)  No results found for: HGBA, HGBA2QUANT, HGBFQUANT, HGBSQUAN (Hemoglobinopathy evaluation)   No results found for: LDH  No results found for: IRON, TIBC, IRONPCTSAT (Iron and TIBC)  No results found for: FERRITIN  Urinalysis No results found for: COLORURINE, APPEARANCEUR, LABSPEC, PHURINE, GLUCOSEU, HGBUR, BILIRUBINUR, KETONESUR, PROTEINUR, UROBILINOGEN, NITRITE, LEUKOCYTESUR   STUDIES:  No results found.  ELIGIBLE FOR AVAILABLE RESEARCH PROTOCOL: Q0347   ASSESSMENT: 58 y.o. [place], [place] woman status post left breast upper outer quadrant biopsy 04/06/2018 for a clinical T2 N2, stage IIIC invasive ductal carcinoma, grade 3, triple negative, with an MIB-1 of 70%  (a) breast MRI 04/19/2018 shows a 5 cm central breast lesion with multiple satellite nodules and greater then 3 abnormal axillary lymph nodes  (b) baseline echocardiogram 04/25/2018 shows an ejection fraction in the 60-65% range  (c) chest CT scan and bone scan showed no metastatic disease.  Nonspecific rib changes will need to be followed  (1) neoadjuvant chemotherapy consisting of doxorubicin and cyclophosphamide given every 21 days x4, starting 05/03/2018, completed 06/28/2018, to be followed by weekly paclitaxel and carboplatin x12 starting 07/20/2018   (a) Doxorubicin and Cyclophosphamide dose reduced for cycles 3 and 4 due to neutropenia  (b) dose dense changed to every 3 weeks for doxorubicin/cyclophosphamide secondary to cytopenias and side effects  (2) definitive surgery to follow  (3) adjuvant radiation after surgery  (4) genetics testing 04/27/2018   PLAN: Jasimine completed the more intense part of her treatment and is now ready to start the carboplatin and paclitaxel portion.  She understands this is not as intense but of course it still can have side effects and the most important 1 of those is neuropathy.  We did discuss that at length today and she understands the  difference between neuropathy and damage to the nailbeds  She may have a first dose reaction and some bony aches but she will not  have those with subsequent treatment.  It is very favorable that I no longer feel any mass in the left breast area.  She is going to see me again in a week.  If all goes well we will start seeing her every other treatment after that until she reaches treatment #8  She knows to call for any other issue that may develop before the next visit.  Chandy Tarman, Virgie Dad, MD  07/20/18 11:07 AM Medical Oncology and Hematology [facility] [address] [place], [place] [address] Tel. [phone]    Fax. [phone]   I, Wilburn Mylar, am acting as scribe for Dr. Virgie Dad. Mubarak Bevens.  I, Lurline Del MD, have reviewed the above documentation for accuracy and completeness, and I agree with the above.

## 2018-07-20 ENCOUNTER — Other Ambulatory Visit: Payer: Self-pay

## 2018-07-20 ENCOUNTER — Telehealth: Payer: Self-pay | Admitting: *Deleted

## 2018-07-20 ENCOUNTER — Inpatient Hospital Stay: Payer: BC Managed Care – PPO

## 2018-07-20 ENCOUNTER — Inpatient Hospital Stay (HOSPITAL_BASED_OUTPATIENT_CLINIC_OR_DEPARTMENT_OTHER): Payer: BC Managed Care – PPO | Admitting: Oncology

## 2018-07-20 VITALS — BP 107/70 | HR 100 | Temp 97.8°F | Resp 18 | Ht 64.0 in | Wt 151.1 lb

## 2018-07-20 VITALS — BP 131/83 | HR 90 | Temp 98.4°F | Resp 18

## 2018-07-20 DIAGNOSIS — C50412 Malignant neoplasm of upper-outer quadrant of left female breast: Secondary | ICD-10-CM

## 2018-07-20 DIAGNOSIS — Z7689 Persons encountering health services in other specified circumstances: Secondary | ICD-10-CM | POA: Diagnosis not present

## 2018-07-20 DIAGNOSIS — C773 Secondary and unspecified malignant neoplasm of axilla and upper limb lymph nodes: Secondary | ICD-10-CM | POA: Diagnosis not present

## 2018-07-20 DIAGNOSIS — Z171 Estrogen receptor negative status [ER-]: Secondary | ICD-10-CM

## 2018-07-20 DIAGNOSIS — Z5111 Encounter for antineoplastic chemotherapy: Secondary | ICD-10-CM

## 2018-07-20 DIAGNOSIS — Z808 Family history of malignant neoplasm of other organs or systems: Secondary | ICD-10-CM

## 2018-07-20 DIAGNOSIS — Z803 Family history of malignant neoplasm of breast: Secondary | ICD-10-CM

## 2018-07-20 DIAGNOSIS — Z8051 Family history of malignant neoplasm of kidney: Secondary | ICD-10-CM

## 2018-07-20 DIAGNOSIS — Z79899 Other long term (current) drug therapy: Secondary | ICD-10-CM

## 2018-07-20 DIAGNOSIS — Z95828 Presence of other vascular implants and grafts: Secondary | ICD-10-CM

## 2018-07-20 DIAGNOSIS — K5900 Constipation, unspecified: Secondary | ICD-10-CM

## 2018-07-20 LAB — COMPREHENSIVE METABOLIC PANEL
ALT: 32 U/L (ref 0–44)
AST: 31 U/L (ref 15–41)
Albumin: 3.3 g/dL — ABNORMAL LOW (ref 3.5–5.0)
Alkaline Phosphatase: 79 U/L (ref 38–126)
Anion gap: 8 (ref 5–15)
BUN: 8 mg/dL (ref 6–20)
CO2: 24 mmol/L (ref 22–32)
Calcium: 8.9 mg/dL (ref 8.9–10.3)
Chloride: 108 mmol/L (ref 98–111)
Creatinine, Ser: 0.64 mg/dL (ref 0.44–1.00)
GFR calc Af Amer: >60 mL/min (ref >60)
GFR calc non Af Amer: >60 mL/min (ref >60)
Glucose, Bld: 97 mg/dL (ref 70–99)
Potassium: 4 mmol/L (ref 3.5–5.1)
Sodium: 140 mmol/L (ref 135–145)
Total Bilirubin: <0.2 mg/dL — ABNORMAL LOW (ref 0.3–1.2)
Total Protein: 6.4 g/dL — ABNORMAL LOW (ref 6.5–8.1)

## 2018-07-20 LAB — CBC WITH DIFFERENTIAL/PLATELET
Abs Immature Granulocytes: 0.07 10*3/uL (ref 0.00–0.07)
Basophils Absolute: 0.1 10*3/uL (ref 0.0–0.1)
Basophils Relative: 2 %
Eosinophils Absolute: 0 10*3/uL (ref 0.0–0.5)
Eosinophils Relative: 0 %
HCT: 29.6 % — ABNORMAL LOW (ref 36.0–46.0)
Hemoglobin: 9.1 g/dL — ABNORMAL LOW (ref 12.0–15.0)
Immature Granulocytes: 2 %
Lymphocytes Relative: 21 %
Lymphs Abs: 0.8 10*3/uL (ref 0.7–4.0)
MCH: 31.4 pg (ref 26.0–34.0)
MCHC: 30.7 g/dL (ref 30.0–36.0)
MCV: 102.1 fL — ABNORMAL HIGH (ref 80.0–100.0)
Monocytes Absolute: 0.7 10*3/uL (ref 0.1–1.0)
Monocytes Relative: 18 %
Neutro Abs: 2.3 10*3/uL (ref 1.7–7.7)
Neutrophils Relative %: 57 %
Platelets: 391 10*3/uL (ref 150–400)
RBC: 2.9 MIL/uL — ABNORMAL LOW (ref 3.87–5.11)
RDW: 20.1 % — ABNORMAL HIGH (ref 11.5–15.5)
WBC: 4 10*3/uL (ref 4.0–10.5)
nRBC: 0 % (ref 0.0–0.2)

## 2018-07-20 MED ORDER — HEPARIN SOD (PORK) LOCK FLUSH 100 UNIT/ML IV SOLN
500.0000 [IU] | Freq: Once | INTRAVENOUS | Status: AC | PRN
  Administered 2018-07-20: 500 [IU]
  Filled 2018-07-20: qty 5

## 2018-07-20 MED ORDER — DIPHENHYDRAMINE HCL 50 MG/ML IJ SOLN
INTRAMUSCULAR | Status: AC
  Filled 2018-07-20: qty 1

## 2018-07-20 MED ORDER — FAMOTIDINE IN NACL 20-0.9 MG/50ML-% IV SOLN
INTRAVENOUS | Status: AC
  Filled 2018-07-20: qty 50

## 2018-07-20 MED ORDER — SODIUM CHLORIDE 0.9 % IV SOLN
230.0000 mg | Freq: Once | INTRAVENOUS | Status: AC
  Administered 2018-07-20: 230 mg via INTRAVENOUS
  Filled 2018-07-20: qty 23

## 2018-07-20 MED ORDER — FAMOTIDINE IN NACL 20-0.9 MG/50ML-% IV SOLN
20.0000 mg | Freq: Once | INTRAVENOUS | Status: AC
  Administered 2018-07-20: 12:00:00 20 mg via INTRAVENOUS

## 2018-07-20 MED ORDER — SODIUM CHLORIDE 0.9 % IV SOLN
80.0000 mg/m2 | Freq: Once | INTRAVENOUS | Status: AC
  Administered 2018-07-20: 144 mg via INTRAVENOUS
  Filled 2018-07-20: qty 24

## 2018-07-20 MED ORDER — DIPHENHYDRAMINE HCL 50 MG/ML IJ SOLN
25.0000 mg | Freq: Once | INTRAMUSCULAR | Status: AC
  Administered 2018-07-20: 25 mg via INTRAVENOUS

## 2018-07-20 MED ORDER — SODIUM CHLORIDE 0.9% FLUSH
10.0000 mL | Freq: Once | INTRAVENOUS | Status: AC
  Administered 2018-07-20: 10 mL
  Filled 2018-07-20: qty 10

## 2018-07-20 MED ORDER — SODIUM CHLORIDE 0.9 % IV SOLN
Freq: Once | INTRAVENOUS | Status: AC
  Administered 2018-07-20: 12:00:00 via INTRAVENOUS
  Filled 2018-07-20: qty 250

## 2018-07-20 MED ORDER — SODIUM CHLORIDE 0.9 % IV SOLN
Freq: Once | INTRAVENOUS | Status: AC
  Administered 2018-07-20: 13:00:00 via INTRAVENOUS
  Filled 2018-07-20: qty 5

## 2018-07-20 MED ORDER — SODIUM CHLORIDE 0.9% FLUSH
10.0000 mL | INTRAVENOUS | Status: DC | PRN
  Administered 2018-07-20: 10 mL
  Filled 2018-07-20: qty 10

## 2018-07-20 NOTE — Telephone Encounter (Signed)
Spoke with patient to follow up from her last treatment.  She states it took her a little longer this last time to get over some side effects.  She was still able to maintain her fluids, but experience some nausea and dry heaving. She states she is feeling good today as she starts her 1st weekly treatment.  Encouraged her to call with any needs or concerns.

## 2018-07-20 NOTE — Patient Instructions (Signed)
   [facility] Discharge Instructions for Patients Receiving Chemotherapy  Today you received the following chemotherapy agents Taxol and Carboplatin   To help prevent nausea and vomiting after your treatment, we encourage you to take your nausea medication as directed.    If you develop nausea and vomiting that is not controlled by your nausea medication, call the clinic.   BELOW ARE SYMPTOMS THAT SHOULD BE REPORTED IMMEDIATELY:  *FEVER GREATER THAN 100.5 F  *CHILLS WITH OR WITHOUT FEVER  NAUSEA AND VOMITING THAT IS NOT CONTROLLED WITH YOUR NAUSEA MEDICATION  *UNUSUAL SHORTNESS OF BREATH  *UNUSUAL BRUISING OR BLEEDING  TENDERNESS IN MOUTH AND THROAT WITH OR WITHOUT PRESENCE OF ULCERS  *URINARY PROBLEMS  *BOWEL PROBLEMS  UNUSUAL RASH Items with * indicate a potential emergency and should be followed up as soon as possible.  Feel free to call the clinic should you have any questions or concerns. The clinic phone number is [phone].  Please show the CHEMO ALERT CARD at check-in to the Emergency Department and triage nurse.   

## 2018-07-21 ENCOUNTER — Telehealth: Payer: Self-pay | Admitting: Oncology

## 2018-07-21 ENCOUNTER — Telehealth: Payer: Self-pay | Admitting: *Deleted

## 2018-07-21 NOTE — Telephone Encounter (Signed)
-----   Message from Zola Button, RN sent at 07/20/2018  4:09 PM EDT ----- Regarding: Dr. Jana Hakim; First time F/U Call Pt received first time Taxol/Carboplatin on 07/20/18. Tolerated well. Thank you!

## 2018-07-21 NOTE — Telephone Encounter (Signed)
Tried to reach regarding schedule °

## 2018-07-21 NOTE — Telephone Encounter (Signed)
Per chemo follow up call- pt states mild nausea and some body aches.  Symptoms are being managed well with use of zofran and decadron.  Pt states understanding to call if concerns occur, no further needs at this time

## 2018-07-25 NOTE — Progress Notes (Signed)
Kathryn [name]  [phone] [phone]    ID: Kathryn [name] DOB: 1960/07/22  MR#: [id number]  VOH#:[id number]  Patient Care Team: Kathryn Blitz, MD as PCP - General (Obstetrics and Gynecology) Kathryn Germany, RN as Oncology Nurse Navigator Kathryn Kaufmann, RN as Oncology Nurse Navigator Kathryn Luna, MD as Consulting Physician (General Surgery) Kathryn [name], Kathryn Dad, MD as Consulting Physician (Oncology) Kathryn Rudd, MD as Consulting Physician (Radiation Oncology) OTHER MD: Kathryn [name] (OBGYN)   CHIEF COMPLAINT: Triple negative breast cancer  CURRENT TREATMENT: Neoadjuvant chemotherapy   INTERVAL HISTORY: Kathryn [name] returns today for follow-up and treatment of her triple negative breast cancer.   She continues on weekly paclitaxel and carboplatin. Today is day 1 cycle 2. She reports some nausea, so she took Zofran and experienced constipation, which was relieved with stool softeners. She denies any reactions. She reports her fatigue is improving.  Since her last visit here, she has not undergone any additional studies.     REVIEW OF SYSTEMS: Kathryn [name] reports doing better overall. She reports becoming less fatigued after walking, so she's been able to slowly increase her time. She notes some tenderness to her upper back and shoulders. The patient denies unusual headaches, visual changes, nausea, vomiting, stiff neck, dizziness, or gait imbalance. There has been no cough, phlegm production, or pleurisy, no chest pain or pressure, and no change in bowel or bladder habits. The patient denies fever, rash, bleeding, unexplained fatigue or unexplained weight loss. A detailed review of systems was otherwise entirely negative.   HISTORY OF CURRENT ILLNESS: From the original intake note:  Kathryn [name] presented with a palpable subareolar left breast lump, nipple retraction, and discharge for approximately 1 week. She underwent bilateral diagnostic mammography with  tomography and left breast ultrasonography at [facility] on 04/01/2018 showing: Breast Density Category C. An irregular hyperdense mass is seen in the left subareolar region. An additional oval, circumscribed mass is seen in the inferior central aspect at posterior depth. No additional suspicious findings are identified within the remainder of the left breast. On physical exam, there is a 2-3 cm firm, fixed lump in the subareolar region on the left. Subtle skin changes and retraction is noted along the left nipple. Targeted ultrasound is performed, showing an irregular hypoechoic mass with associated vascularity at the 12 o'clock retroareolar position on the left. Overall measurements are 2.6 x 2.3 x 1.2 cm. This corresponds with the mammographic finding. Two adjacent circumscribed hypoechoic masses are identified in the deep 6 o'clock position 2 cm from the nipple. They measure 0.8 x 0.6 x 0.4 cm and 1.3 x 1.1 x 0.7 cm. There is no seated vascularity. This corresponds with the additional mammographic finding and likely represents minimally complicated cyst. Evaluation of the left axilla demonstrates a markedly enlarged abnormal lymph node with complete hilar replacement. It measures up to 5 cm in long axis dimension. No suspicious mammographic findings on the right.   Accordingly on 04/06/2018 she proceeded to biopsy of the left breast area in question. The pathology from this procedure showed ([id number]): invasive ductal carcinoma, grade III, with lymphovascular invasion. Prognostic indicators significant for: estrogen receptor, 0% negative and progesterone receptor, 0% negative. Proliferation marker Ki67 at 70%. HER2 equivocal (2+) by immunohistochemistry, but negative by FISH (print3ed report pending).  On the same day she underwent a biopsy of the left axillary lymph node on 04/06/2018 showing ([id number]): metastatic carcinoma in 1 of 1 lymph node (1/1).   The patient's subsequent history is  as  detailed below.   PAST MEDICAL HISTORY: Past Medical History:  Diagnosis Date  . Breast cancer ([place])    stage 3 - left  . Family history of kidney cancer   . Family history of melanoma      PAST SURGICAL HISTORY: Past Surgical History:  Procedure Laterality Date  . CESAREAN SECTION     x3  . PORTACATH PLACEMENT N/A 04/28/2018   Procedure: INSERTION PORT-A-CATH WITH ULTRASOUND;  Surgeon: Kathryn Luna, MD;  Location: [facility] OR;  Service: General;  Laterality: N/A;     FAMILY HISTORY: Family History  Problem Relation Age of Onset  . Kidney cancer Sister 32       d. 80  . Lung cancer Paternal Uncle   . Melanoma Sister   . Melanoma Sister    Kathryn [name] died from congestive heart failure at age 23. Patients' mother is 67 as of 03/2018. The patient has 1 brother and 3 sisters. Patient denies anyone in her family having breast, ovarian, prostate, or pancreatic cancer. Kathryn [name] sister, Kathryn [name], [name] Kathryn [name] has an uncle and a cousin that were diagnosed with lung cancer, but they were both heavy smokers.    GYNECOLOGIC HISTORY:  No LMP recorded. Patient is postmenopausal. 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 Her husband, Kathryn [name], owns [employer]. Together, they have three children, Kathryn [name], Kathryn [name], and Kathryn [name]. 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 Kathryn [name] is 66, lives in [place], and is a Equities trader at [employer]. 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   ADVANCED DIRECTIVES: Kathryn [name]'s husband, Kathryn [name], is automatically her healthcare power of attorney.    HEALTH  MAINTENANCE: Social History   Tobacco Use  . Smoking status: Never Smoker  . Smokeless tobacco: Never Used  Substance Use Topics  . Alcohol use: Yes    Comment: socially  . Drug use: Never    Colonoscopy: no  PAP: 2015  Bone density: no   No Known Allergies  Current Outpatient Medications  Medication Sig Dispense Refill  . azithromycin (ZITHROMAX) 250 MG tablet Take 2 tablets day 1, then 1 tablet daily 6 each 0  . Docusate Sodium (COLACE PO) Take by mouth as needed.    Marland Kitchen ibuprofen (ADVIL,MOTRIN) 800 MG tablet Take 1 tablet (800 mg total) by mouth every 8 (eight) hours as needed. 30 tablet 0  . lidocaine-prilocaine (EMLA) cream Apply to affected area once 30 g 3  . loratadine (CLARITIN) 10 MG tablet Take 1 tablet (10 mg total) by mouth daily.    Marland Kitchen LORazepam (ATIVAN) 0.5 MG tablet Take 1 tablet (0.5 mg total) by mouth at bedtime as needed (Nausea or vomiting). 30 tablet 0  . magic mouthwash w/lidocaine SOLN swish, gargle & spit 5-10 MILLILITER EVERY 6 HOURS AS NEEDED    . omeprazole (PRILOSEC) 20 MG capsule Take 1 capsule (20 mg total) by mouth daily as needed. 30 capsule 3  . ondansetron (ZOFRAN) 8 MG tablet Take 0.5-1 tablets (4-8 mg total) by mouth 3 (three) times daily as needed for nausea or vomiting. 20 tablet 0   No current facility-administered medications  for this visit.      OBJECTIVE: Middle-aged white woman who appears well Vitals:   07/26/18 0930  BP: (!) 143/70  Pulse: (!) 113  Resp: 18  Temp: 98.7 F (37.1 C)  SpO2: 100%     Body mass index is 25.56 kg/m.   Wt Readings from Last 3 Encounters:  07/26/18 148 lb 14.4 oz (67.5 kg)  07/20/18 151 lb 1.6 oz (68.5 kg)  06/28/18 150 lb 4.8 oz (68.2 kg)  ECOG FS:1  Sclerae unicteric, pupils round and equal Wearing a mask No cervical or supraclavicular adenopathy Lungs no rales or rhonchi Heart regular rate and rhythm Abd soft, nontender, positive bowel sounds MSK no focal spinal tenderness, no upper  extremity lymphedema Neuro: nonfocal, well oriented, appropriate affect Breasts: The right breast is unremarkable.  The left breast has a slightly inverted nipple.  I do not palpate a mass.  There is no erythema.  Both axillae are benign.   LAB RESULTS:  CMP     Component Value Date/Time   NA 140 07/20/2018 1024   K 4.0 07/20/2018 1024   CL 108 07/20/2018 1024   CO2 24 07/20/2018 1024   GLUCOSE 97 07/20/2018 1024   BUN 8 07/20/2018 1024   CREATININE 0.64 07/20/2018 1024   CREATININE 0.83 04/13/2018 0843   CALCIUM 8.9 07/20/2018 1024   PROT 6.4 (L) 07/20/2018 1024   ALBUMIN 3.3 (L) 07/20/2018 1024   AST 31 07/20/2018 1024   AST 15 04/13/2018 0843   ALT 32 07/20/2018 1024   ALT 16 04/13/2018 0843   ALKPHOS 79 07/20/2018 1024   BILITOT <0.2 (L) 07/20/2018 1024   BILITOT 0.5 04/13/2018 0843   GFRNONAA >60 07/20/2018 1024   GFRNONAA >60 04/13/2018 0843   GFRAA >60 07/20/2018 1024   GFRAA >60 04/13/2018 0843    No results found for: TOTALPROTELP, ALBUMINELP, A1GS, A2GS, BETS, BETA2SER, GAMS, MSPIKE, SPEI  No results found for: KPAFRELGTCHN, LAMBDASER, KAPLAMBRATIO  Lab Results  Component Value Date   WBC 4.0 07/26/2018   NEUTROABS 3.2 07/26/2018   HGB 9.6 (L) 07/26/2018   HCT 29.6 (L) 07/26/2018   MCV 98.7 07/26/2018   PLT 265 07/26/2018    '@LASTCHEMISTRY'$ @  No results found for: LABCA2  No components found for: IPJASN053  No results for input(s): INR in the last 168 hours.  No results found for: LABCA2  No results found for: ZJQ734  No results found for: LPF790  No results found for: WIO973  No results found for: CA2729  No components found for: HGQUANT  No results found for: CEA1 / No results found for: CEA1   No results found for: AFPTUMOR  No results found for: CHROMOGRNA  No results found for: PSA1  Appointment on 07/26/2018  Component Date Value Ref Range Status  . WBC 07/26/2018 4.0  4.0 - 10.5 K/uL Final  . RBC 07/26/2018 3.00* 3.87 -  5.11 MIL/uL Final  . Hemoglobin 07/26/2018 9.6* 12.0 - 15.0 g/dL Final  . HCT 07/26/2018 29.6* 36.0 - 46.0 % Final  . MCV 07/26/2018 98.7  80.0 - 100.0 fL Final  . MCH 07/26/2018 32.0  26.0 - 34.0 pg Final  . MCHC 07/26/2018 32.4  30.0 - 36.0 g/dL Final  . RDW 07/26/2018 18.6* 11.5 - 15.5 % Final  . Platelets 07/26/2018 265  150 - 400 K/uL Final  . nRBC 07/26/2018 0.0  0.0 - 0.2 % Final  . Neutrophils Relative % 07/26/2018 80  % Final  . Neutro  Abs 07/26/2018 3.2  1.7 - 7.7 K/uL Final  . Lymphocytes Relative 07/26/2018 12  % Final  . Lymphs Abs 07/26/2018 0.5* 0.7 - 4.0 K/uL Final  . Monocytes Relative 07/26/2018 6  % Final  . Monocytes Absolute 07/26/2018 0.2  0.1 - 1.0 K/uL Final  . Eosinophils Relative 07/26/2018 0  % Final  . Eosinophils Absolute 07/26/2018 0.0  0.0 - 0.5 K/uL Final  . Basophils Relative 07/26/2018 1  % Final  . Basophils Absolute 07/26/2018 0.1  0.0 - 0.1 K/uL Final  . Immature Granulocytes 07/26/2018 1  % Final  . Abs Immature Granulocytes 07/26/2018 0.02  0.00 - 0.07 K/uL Final   Performed at [facility], [place] [address], [place], [place] [address]    (this displays the last labs from the last 3 days)  No results found for: TOTALPROTELP, ALBUMINELP, A1GS, A2GS, BETS, BETA2SER, GAMS, MSPIKE, SPEI (this displays SPEP labs)  No results found for: KPAFRELGTCHN, LAMBDASER, KAPLAMBRATIO (kappa/lambda light chains)  No results found for: HGBA, HGBA2QUANT, HGBFQUANT, HGBSQUAN (Hemoglobinopathy evaluation)   No results found for: LDH  No results found for: IRON, TIBC, IRONPCTSAT (Iron and TIBC)  No results found for: FERRITIN  Urinalysis No results found for: COLORURINE, APPEARANCEUR, LABSPEC, PHURINE, GLUCOSEU, HGBUR, BILIRUBINUR, KETONESUR, PROTEINUR, UROBILINOGEN, NITRITE, LEUKOCYTESUR   STUDIES:  No results found.  ELIGIBLE FOR AVAILABLE RESEARCH PROTOCOL: [id number]   ASSESSMENT: 58 y.o. [place], [place] woman status post left  breast upper outer quadrant biopsy 04/06/2018 for a clinical T2 N2, stage IIIC invasive ductal carcinoma, grade 3, triple negative, with an MIB-1 of 70%  (a) breast MRI 04/19/2018 shows a 5 cm central breast lesion with multiple satellite nodules and greater then 3 abnormal axillary lymph nodes  (b) baseline echocardiogram 04/25/2018 shows an ejection fraction in the 60-65% range  (c) chest CT scan and bone scan showed no metastatic disease.  Nonspecific rib changes will need to be followed  (1) neoadjuvant chemotherapy consisting of doxorubicin and cyclophosphamide given every 21 days x4, starting 05/03/2018, completed 06/28/2018, to be followed by weekly paclitaxel and carboplatin x12 starting 07/20/2018   (a) Doxorubicin and Cyclophosphamide dose reduced for cycles 3 and 4 due to neutropenia  (b) dose dense changed to every 3 weeks for doxorubicin/cyclophosphamide secondary to cytopenias and side effects  (2) definitive surgery to follow  (3) adjuvant radiation after surgery  (4) genetics testing 04/27/2018   PLAN: Yelina tolerated her first cycle of Taxol/carbo generally well.  She did have some aches and pains but these should not occur with subsequent treatment.  She is recovering from the fatigue she experienced from the more intense earlier therapy.  She is starting a walking program.  This is very helpful.  She understands that though we have scheduled her for weekly treatments if she needs to go to the beach 1 week over there is family issue she can take a week off and we will simply add that back at the end.  Incidentally she brought me a book to read called a "a higher call" being a story from [employer]  She will see [place] every 2 weeks until she gets her eighth treatment after which she will see [place] weekly  She knows to call for any other issue that may develop before the next visit.  Porshe Fleagle, Kathryn Dad, MD  07/26/18 10:01 AM  Medical Oncology and Hematology [facility] [address] [place], [place] [address] Tel. [phone]  Fax. [phone]   I, Wilburn Mylar, am acting as scribe for Dr. Virgie [name]. Cory Rama.  I, Lurline Del MD, have reviewed the above documentation for accuracy and completeness, and I agree with the above.

## 2018-07-26 ENCOUNTER — Inpatient Hospital Stay (HOSPITAL_BASED_OUTPATIENT_CLINIC_OR_DEPARTMENT_OTHER): Payer: BC Managed Care – PPO | Admitting: Oncology

## 2018-07-26 ENCOUNTER — Inpatient Hospital Stay: Payer: BC Managed Care – PPO

## 2018-07-26 ENCOUNTER — Telehealth: Payer: Self-pay | Admitting: *Deleted

## 2018-07-26 ENCOUNTER — Other Ambulatory Visit: Payer: Self-pay | Admitting: *Deleted

## 2018-07-26 ENCOUNTER — Inpatient Hospital Stay: Payer: BC Managed Care – PPO | Attending: Oncology

## 2018-07-26 ENCOUNTER — Other Ambulatory Visit: Payer: Self-pay

## 2018-07-26 VITALS — HR 106

## 2018-07-26 VITALS — BP 143/70 | HR 113 | Temp 98.7°F | Resp 18 | Ht 64.0 in | Wt 148.9 lb

## 2018-07-26 DIAGNOSIS — Z171 Estrogen receptor negative status [ER-]: Secondary | ICD-10-CM

## 2018-07-26 DIAGNOSIS — G629 Polyneuropathy, unspecified: Secondary | ICD-10-CM | POA: Diagnosis not present

## 2018-07-26 DIAGNOSIS — Z7689 Persons encountering health services in other specified circumstances: Secondary | ICD-10-CM | POA: Insufficient documentation

## 2018-07-26 DIAGNOSIS — D709 Neutropenia, unspecified: Secondary | ICD-10-CM | POA: Diagnosis not present

## 2018-07-26 DIAGNOSIS — K5900 Constipation, unspecified: Secondary | ICD-10-CM | POA: Insufficient documentation

## 2018-07-26 DIAGNOSIS — Z8051 Family history of malignant neoplasm of kidney: Secondary | ICD-10-CM

## 2018-07-26 DIAGNOSIS — C50412 Malignant neoplasm of upper-outer quadrant of left female breast: Secondary | ICD-10-CM | POA: Insufficient documentation

## 2018-07-26 DIAGNOSIS — Z801 Family history of malignant neoplasm of trachea, bronchus and lung: Secondary | ICD-10-CM | POA: Insufficient documentation

## 2018-07-26 DIAGNOSIS — R000 Tachycardia, unspecified: Secondary | ICD-10-CM | POA: Insufficient documentation

## 2018-07-26 DIAGNOSIS — R5383 Other fatigue: Secondary | ICD-10-CM

## 2018-07-26 DIAGNOSIS — R590 Localized enlarged lymph nodes: Secondary | ICD-10-CM | POA: Insufficient documentation

## 2018-07-26 DIAGNOSIS — Z79899 Other long term (current) drug therapy: Secondary | ICD-10-CM

## 2018-07-26 DIAGNOSIS — Z5111 Encounter for antineoplastic chemotherapy: Secondary | ICD-10-CM | POA: Insufficient documentation

## 2018-07-26 DIAGNOSIS — R110 Nausea: Secondary | ICD-10-CM

## 2018-07-26 DIAGNOSIS — Z923 Personal history of irradiation: Secondary | ICD-10-CM | POA: Diagnosis not present

## 2018-07-26 DIAGNOSIS — Z85820 Personal history of malignant melanoma of skin: Secondary | ICD-10-CM | POA: Insufficient documentation

## 2018-07-26 DIAGNOSIS — Z95828 Presence of other vascular implants and grafts: Secondary | ICD-10-CM

## 2018-07-26 LAB — COMPREHENSIVE METABOLIC PANEL
ALT: 44 U/L (ref 0–44)
AST: 24 U/L (ref 15–41)
Albumin: 3.4 g/dL — ABNORMAL LOW (ref 3.5–5.0)
Alkaline Phosphatase: 77 U/L (ref 38–126)
Anion gap: 8 (ref 5–15)
BUN: 10 mg/dL (ref 6–20)
CO2: 25 mmol/L (ref 22–32)
Calcium: 9 mg/dL (ref 8.9–10.3)
Chloride: 106 mmol/L (ref 98–111)
Creatinine, Ser: 0.64 mg/dL (ref 0.44–1.00)
GFR calc Af Amer: >60 mL/min (ref >60)
GFR calc non Af Amer: >60 mL/min (ref >60)
Glucose, Bld: 102 mg/dL — ABNORMAL HIGH (ref 70–99)
Potassium: 3.7 mmol/L (ref 3.5–5.1)
Sodium: 139 mmol/L (ref 135–145)
Total Bilirubin: 0.4 mg/dL (ref 0.3–1.2)
Total Protein: 6.4 g/dL — ABNORMAL LOW (ref 6.5–8.1)

## 2018-07-26 LAB — CBC WITH DIFFERENTIAL/PLATELET
Abs Immature Granulocytes: 0.02 10*3/uL (ref 0.00–0.07)
Basophils Absolute: 0.1 10*3/uL (ref 0.0–0.1)
Basophils Relative: 1 %
Eosinophils Absolute: 0 10*3/uL (ref 0.0–0.5)
Eosinophils Relative: 0 %
HCT: 29.6 % — ABNORMAL LOW (ref 36.0–46.0)
Hemoglobin: 9.6 g/dL — ABNORMAL LOW (ref 12.0–15.0)
Immature Granulocytes: 1 %
Lymphocytes Relative: 12 %
Lymphs Abs: 0.5 10*3/uL — ABNORMAL LOW (ref 0.7–4.0)
MCH: 32 pg (ref 26.0–34.0)
MCHC: 32.4 g/dL (ref 30.0–36.0)
MCV: 98.7 fL (ref 80.0–100.0)
Monocytes Absolute: 0.2 10*3/uL (ref 0.1–1.0)
Monocytes Relative: 6 %
Neutro Abs: 3.2 10*3/uL (ref 1.7–7.7)
Neutrophils Relative %: 80 %
Platelets: 265 10*3/uL (ref 150–400)
RBC: 3 MIL/uL — ABNORMAL LOW (ref 3.87–5.11)
RDW: 18.6 % — ABNORMAL HIGH (ref 11.5–15.5)
WBC: 4 10*3/uL (ref 4.0–10.5)
nRBC: 0 % (ref 0.0–0.2)

## 2018-07-26 MED ORDER — SODIUM CHLORIDE 0.9 % IV SOLN
Freq: Once | INTRAVENOUS | Status: AC
  Administered 2018-07-26: 11:00:00 via INTRAVENOUS
  Filled 2018-07-26: qty 250

## 2018-07-26 MED ORDER — HEPARIN SOD (PORK) LOCK FLUSH 100 UNIT/ML IV SOLN
500.0000 [IU] | Freq: Once | INTRAVENOUS | Status: AC | PRN
  Administered 2018-07-26: 14:00:00 500 [IU]
  Filled 2018-07-26: qty 5

## 2018-07-26 MED ORDER — SODIUM CHLORIDE 0.9% FLUSH
10.0000 mL | INTRAVENOUS | Status: DC | PRN
  Administered 2018-07-26: 14:00:00 10 mL
  Filled 2018-07-26: qty 10

## 2018-07-26 MED ORDER — SODIUM CHLORIDE 0.9% FLUSH
10.0000 mL | Freq: Once | INTRAVENOUS | Status: AC
  Administered 2018-07-26: 10 mL
  Filled 2018-07-26: qty 10

## 2018-07-26 MED ORDER — SODIUM CHLORIDE 0.9 % IV SOLN
80.0000 mg/m2 | Freq: Once | INTRAVENOUS | Status: AC
  Administered 2018-07-26: 13:00:00 144 mg via INTRAVENOUS
  Filled 2018-07-26: qty 24

## 2018-07-26 MED ORDER — DIPHENHYDRAMINE HCL 50 MG/ML IJ SOLN
INTRAMUSCULAR | Status: AC
  Filled 2018-07-26: qty 1

## 2018-07-26 MED ORDER — SODIUM CHLORIDE 0.9 % IV SOLN
230.0000 mg | Freq: Once | INTRAVENOUS | Status: AC
  Administered 2018-07-26: 230 mg via INTRAVENOUS
  Filled 2018-07-26: qty 23

## 2018-07-26 MED ORDER — FAMOTIDINE IN NACL 20-0.9 MG/50ML-% IV SOLN
INTRAVENOUS | Status: AC
  Filled 2018-07-26: qty 50

## 2018-07-26 MED ORDER — FAMOTIDINE IN NACL 20-0.9 MG/50ML-% IV SOLN
20.0000 mg | Freq: Once | INTRAVENOUS | Status: AC
  Administered 2018-07-26: 20 mg via INTRAVENOUS

## 2018-07-26 MED ORDER — SODIUM CHLORIDE 0.9 % IV SOLN
Freq: Once | INTRAVENOUS | Status: AC
  Administered 2018-07-26: 11:00:00 via INTRAVENOUS
  Filled 2018-07-26: qty 5

## 2018-07-26 MED ORDER — DIPHENHYDRAMINE HCL 50 MG/ML IJ SOLN
25.0000 mg | Freq: Once | INTRAMUSCULAR | Status: AC
  Administered 2018-07-26: 11:00:00 25 mg via INTRAVENOUS

## 2018-07-26 NOTE — Patient Instructions (Signed)
   [facility] Discharge Instructions for Patients Receiving Chemotherapy  Today you received the following chemotherapy agents Taxol and Carboplatin   To help prevent nausea and vomiting after your treatment, we encourage you to take your nausea medication as directed.    If you develop nausea and vomiting that is not controlled by your nausea medication, call the clinic.   BELOW ARE SYMPTOMS THAT SHOULD BE REPORTED IMMEDIATELY:  *FEVER GREATER THAN 100.5 F  *CHILLS WITH OR WITHOUT FEVER  NAUSEA AND VOMITING THAT IS NOT CONTROLLED WITH YOUR NAUSEA MEDICATION  *UNUSUAL SHORTNESS OF BREATH  *UNUSUAL BRUISING OR BLEEDING  TENDERNESS IN MOUTH AND THROAT WITH OR WITHOUT PRESENCE OF ULCERS  *URINARY PROBLEMS  *BOWEL PROBLEMS  UNUSUAL RASH Items with * indicate a potential emergency and should be followed up as soon as possible.  Feel free to call the clinic should you have any questions or concerns. The clinic phone number is [phone].  Please show the CHEMO ALERT CARD at check-in to the Emergency Department and triage nurse.   

## 2018-07-26 NOTE — Telephone Encounter (Signed)
Ok to treat with heart rate of 106

## 2018-07-26 NOTE — Progress Notes (Signed)
Clarified treatment with MD: Patient received Emend with first cycle. Emend now added to all other weekly treatments.  Carbo (AUC 2) added to treatment plan per MD instruction.  Pt should receive weekly Carbo/taxol x 12 weeks. Pt does not want Aloxi due to constipation concerns. She will take Zofran before coming to infusion. She doesn't want Zofran in her treatment plan.   Hardie Pulley, PharmD, BCPS, BCOP

## 2018-07-27 ENCOUNTER — Telehealth: Payer: Self-pay | Admitting: Oncology

## 2018-07-27 NOTE — Telephone Encounter (Signed)
I left a message regarding schedule  

## 2018-08-02 ENCOUNTER — Inpatient Hospital Stay: Payer: BC Managed Care – PPO

## 2018-08-02 ENCOUNTER — Other Ambulatory Visit: Payer: Self-pay

## 2018-08-02 VITALS — BP 124/81 | HR 106 | Temp 98.0°F | Resp 18

## 2018-08-02 DIAGNOSIS — Z171 Estrogen receptor negative status [ER-]: Secondary | ICD-10-CM

## 2018-08-02 DIAGNOSIS — C50412 Malignant neoplasm of upper-outer quadrant of left female breast: Secondary | ICD-10-CM

## 2018-08-02 DIAGNOSIS — Z95828 Presence of other vascular implants and grafts: Secondary | ICD-10-CM

## 2018-08-02 LAB — CBC WITH DIFFERENTIAL/PLATELET
Abs Immature Granulocytes: 0.01 10*3/uL (ref 0.00–0.07)
Basophils Absolute: 0 10*3/uL (ref 0.0–0.1)
Basophils Relative: 0 %
Eosinophils Absolute: 0 10*3/uL (ref 0.0–0.5)
Eosinophils Relative: 1 %
HCT: 29.2 % — ABNORMAL LOW (ref 36.0–46.0)
Hemoglobin: 9.3 g/dL — ABNORMAL LOW (ref 12.0–15.0)
Immature Granulocytes: 0 %
Lymphocytes Relative: 20 %
Lymphs Abs: 0.6 10*3/uL — ABNORMAL LOW (ref 0.7–4.0)
MCH: 32.5 pg (ref 26.0–34.0)
MCHC: 31.8 g/dL (ref 30.0–36.0)
MCV: 102.1 fL — ABNORMAL HIGH (ref 80.0–100.0)
Monocytes Absolute: 0.4 10*3/uL (ref 0.1–1.0)
Monocytes Relative: 13 %
Neutro Abs: 1.9 10*3/uL (ref 1.7–7.7)
Neutrophils Relative %: 66 %
Platelets: 223 10*3/uL (ref 150–400)
RBC: 2.86 MIL/uL — ABNORMAL LOW (ref 3.87–5.11)
RDW: 17.9 % — ABNORMAL HIGH (ref 11.5–15.5)
WBC: 2.8 10*3/uL — ABNORMAL LOW (ref 4.0–10.5)
nRBC: 0 % (ref 0.0–0.2)

## 2018-08-02 LAB — CMP (CANCER CENTER ONLY)
ALT: 41 U/L (ref 0–44)
AST: 29 U/L (ref 15–41)
Albumin: 3.5 g/dL (ref 3.5–5.0)
Alkaline Phosphatase: 72 U/L (ref 38–126)
Anion gap: 9 (ref 5–15)
BUN: 10 mg/dL (ref 6–20)
CO2: 23 mmol/L (ref 22–32)
Calcium: 8.9 mg/dL (ref 8.9–10.3)
Chloride: 106 mmol/L (ref 98–111)
Creatinine: 0.62 mg/dL (ref 0.44–1.00)
GFR, Est AFR Am: >60 mL/min (ref >60)
GFR, Estimated: >60 mL/min (ref >60)
Glucose, Bld: 97 mg/dL (ref 70–99)
Potassium: 3.9 mmol/L (ref 3.5–5.1)
Sodium: 138 mmol/L (ref 135–145)
Total Bilirubin: 0.3 mg/dL (ref 0.3–1.2)
Total Protein: 6.4 g/dL — ABNORMAL LOW (ref 6.5–8.1)

## 2018-08-02 MED ORDER — SODIUM CHLORIDE 0.9 % IV SOLN
80.0000 mg/m2 | Freq: Once | INTRAVENOUS | Status: AC
  Administered 2018-08-02: 11:00:00 144 mg via INTRAVENOUS
  Filled 2018-08-02: qty 24

## 2018-08-02 MED ORDER — FAMOTIDINE IN NACL 20-0.9 MG/50ML-% IV SOLN
20.0000 mg | Freq: Once | INTRAVENOUS | Status: AC
  Administered 2018-08-02: 10:00:00 20 mg via INTRAVENOUS

## 2018-08-02 MED ORDER — SODIUM CHLORIDE 0.9 % IV SOLN
Freq: Once | INTRAVENOUS | Status: AC
  Administered 2018-08-02: 09:00:00 via INTRAVENOUS
  Filled 2018-08-02: qty 250

## 2018-08-02 MED ORDER — SODIUM CHLORIDE 0.9 % IV SOLN
Freq: Once | INTRAVENOUS | Status: AC
  Administered 2018-08-02: 10:00:00 via INTRAVENOUS
  Filled 2018-08-02: qty 5

## 2018-08-02 MED ORDER — HEPARIN SOD (PORK) LOCK FLUSH 100 UNIT/ML IV SOLN
500.0000 [IU] | Freq: Once | INTRAVENOUS | Status: AC | PRN
  Administered 2018-08-02: 13:00:00 500 [IU]
  Filled 2018-08-02: qty 5

## 2018-08-02 MED ORDER — SODIUM CHLORIDE 0.9% FLUSH
10.0000 mL | Freq: Once | INTRAVENOUS | Status: AC
  Administered 2018-08-02: 09:00:00 10 mL
  Filled 2018-08-02: qty 10

## 2018-08-02 MED ORDER — PALONOSETRON HCL INJECTION 0.25 MG/5ML
INTRAVENOUS | Status: AC
  Filled 2018-08-02: qty 5

## 2018-08-02 MED ORDER — DIPHENHYDRAMINE HCL 50 MG/ML IJ SOLN
25.0000 mg | Freq: Once | INTRAMUSCULAR | Status: AC
  Administered 2018-08-02: 09:00:00 25 mg via INTRAVENOUS

## 2018-08-02 MED ORDER — SODIUM CHLORIDE 0.9% FLUSH
10.0000 mL | INTRAVENOUS | Status: DC | PRN
  Administered 2018-08-02: 13:00:00 10 mL
  Filled 2018-08-02: qty 10

## 2018-08-02 MED ORDER — FAMOTIDINE IN NACL 20-0.9 MG/50ML-% IV SOLN
INTRAVENOUS | Status: AC
  Filled 2018-08-02: qty 50

## 2018-08-02 MED ORDER — SODIUM CHLORIDE 0.9 % IV SOLN
230.0000 mg | Freq: Once | INTRAVENOUS | Status: AC
  Administered 2018-08-02: 230 mg via INTRAVENOUS
  Filled 2018-08-02: qty 23

## 2018-08-02 MED ORDER — DIPHENHYDRAMINE HCL 50 MG/ML IJ SOLN
INTRAMUSCULAR | Status: AC
  Filled 2018-08-02: qty 1

## 2018-08-02 NOTE — Progress Notes (Signed)
Per Magrinat pt is OK to treat with HR

## 2018-08-02 NOTE — Patient Instructions (Signed)
   [facility] Discharge Instructions for Patients Receiving Chemotherapy  Today you received the following chemotherapy agents Taxol and Carboplatin   To help prevent nausea and vomiting after your treatment, we encourage you to take your nausea medication as directed.    If you develop nausea and vomiting that is not controlled by your nausea medication, call the clinic.   BELOW ARE SYMPTOMS THAT SHOULD BE REPORTED IMMEDIATELY:  *FEVER GREATER THAN 100.5 F  *CHILLS WITH OR WITHOUT FEVER  NAUSEA AND VOMITING THAT IS NOT CONTROLLED WITH YOUR NAUSEA MEDICATION  *UNUSUAL SHORTNESS OF BREATH  *UNUSUAL BRUISING OR BLEEDING  TENDERNESS IN MOUTH AND THROAT WITH OR WITHOUT PRESENCE OF ULCERS  *URINARY PROBLEMS  *BOWEL PROBLEMS  UNUSUAL RASH Items with * indicate a potential emergency and should be followed up as soon as possible.  Feel free to call the clinic should you have any questions or concerns. The clinic phone number is [phone].  Please show the CHEMO ALERT CARD at check-in to the Emergency Department and triage nurse.   

## 2018-08-04 ENCOUNTER — Telehealth: Payer: Self-pay | Admitting: *Deleted

## 2018-08-04 NOTE — Telephone Encounter (Signed)
Spoke with patient to check in after her 3rd cycle taxol.  She states she is doing much better with this one. She says she is getting some of her energy back. Encouraged to call with any needs or concerns.  Patient verbalized understanding.

## 2018-08-09 ENCOUNTER — Inpatient Hospital Stay: Payer: BC Managed Care – PPO

## 2018-08-09 ENCOUNTER — Other Ambulatory Visit: Payer: Self-pay

## 2018-08-09 ENCOUNTER — Encounter: Payer: Self-pay | Admitting: Adult Health

## 2018-08-09 ENCOUNTER — Other Ambulatory Visit: Payer: Self-pay | Admitting: Oncology

## 2018-08-09 ENCOUNTER — Inpatient Hospital Stay (HOSPITAL_BASED_OUTPATIENT_CLINIC_OR_DEPARTMENT_OTHER): Payer: BC Managed Care – PPO | Admitting: Adult Health

## 2018-08-09 VITALS — BP 126/78 | HR 116 | Temp 98.0°F | Resp 18 | Ht 64.0 in | Wt 149.0 lb

## 2018-08-09 DIAGNOSIS — Z79899 Other long term (current) drug therapy: Secondary | ICD-10-CM

## 2018-08-09 DIAGNOSIS — Z7689 Persons encountering health services in other specified circumstances: Secondary | ICD-10-CM

## 2018-08-09 DIAGNOSIS — Z171 Estrogen receptor negative status [ER-]: Secondary | ICD-10-CM

## 2018-08-09 DIAGNOSIS — Z95828 Presence of other vascular implants and grafts: Secondary | ICD-10-CM

## 2018-08-09 DIAGNOSIS — C50412 Malignant neoplasm of upper-outer quadrant of left female breast: Secondary | ICD-10-CM

## 2018-08-09 DIAGNOSIS — R590 Localized enlarged lymph nodes: Secondary | ICD-10-CM

## 2018-08-09 DIAGNOSIS — Z5111 Encounter for antineoplastic chemotherapy: Secondary | ICD-10-CM | POA: Diagnosis not present

## 2018-08-09 DIAGNOSIS — K5900 Constipation, unspecified: Secondary | ICD-10-CM

## 2018-08-09 DIAGNOSIS — Z85820 Personal history of malignant melanoma of skin: Secondary | ICD-10-CM

## 2018-08-09 DIAGNOSIS — R110 Nausea: Secondary | ICD-10-CM

## 2018-08-09 DIAGNOSIS — Z8051 Family history of malignant neoplasm of kidney: Secondary | ICD-10-CM

## 2018-08-09 DIAGNOSIS — R5383 Other fatigue: Secondary | ICD-10-CM

## 2018-08-09 DIAGNOSIS — R000 Tachycardia, unspecified: Secondary | ICD-10-CM

## 2018-08-09 DIAGNOSIS — Z801 Family history of malignant neoplasm of trachea, bronchus and lung: Secondary | ICD-10-CM

## 2018-08-09 LAB — CBC WITH DIFFERENTIAL/PLATELET
Abs Immature Granulocytes: 0.02 10*3/uL (ref 0.00–0.07)
Basophils Absolute: 0 10*3/uL (ref 0.0–0.1)
Basophils Relative: 1 %
Eosinophils Absolute: 0 10*3/uL (ref 0.0–0.5)
Eosinophils Relative: 1 %
HCT: 26.7 % — ABNORMAL LOW (ref 36.0–46.0)
Hemoglobin: 8.7 g/dL — ABNORMAL LOW (ref 12.0–15.0)
Immature Granulocytes: 1 %
Lymphocytes Relative: 23 %
Lymphs Abs: 0.5 10*3/uL — ABNORMAL LOW (ref 0.7–4.0)
MCH: 33.3 pg (ref 26.0–34.0)
MCHC: 32.6 g/dL (ref 30.0–36.0)
MCV: 102.3 fL — ABNORMAL HIGH (ref 80.0–100.0)
Monocytes Absolute: 0.3 10*3/uL (ref 0.1–1.0)
Monocytes Relative: 13 %
Neutro Abs: 1.3 10*3/uL — ABNORMAL LOW (ref 1.7–7.7)
Neutrophils Relative %: 61 %
Platelets: 176 10*3/uL (ref 150–400)
RBC: 2.61 MIL/uL — ABNORMAL LOW (ref 3.87–5.11)
RDW: 17.5 % — ABNORMAL HIGH (ref 11.5–15.5)
WBC: 2 10*3/uL — ABNORMAL LOW (ref 4.0–10.5)
nRBC: 0 % (ref 0.0–0.2)

## 2018-08-09 LAB — COMPREHENSIVE METABOLIC PANEL
ALT: 48 U/L — ABNORMAL HIGH (ref 0–44)
AST: 31 U/L (ref 15–41)
Albumin: 3.5 g/dL (ref 3.5–5.0)
Alkaline Phosphatase: 74 U/L (ref 38–126)
Anion gap: 10 (ref 5–15)
BUN: 10 mg/dL (ref 6–20)
CO2: 22 mmol/L (ref 22–32)
Calcium: 9 mg/dL (ref 8.9–10.3)
Chloride: 107 mmol/L (ref 98–111)
Creatinine, Ser: 0.78 mg/dL (ref 0.44–1.00)
GFR calc Af Amer: >60 mL/min (ref >60)
GFR calc non Af Amer: >60 mL/min (ref >60)
Glucose, Bld: 98 mg/dL (ref 70–99)
Potassium: 3.6 mmol/L (ref 3.5–5.1)
Sodium: 139 mmol/L (ref 135–145)
Total Bilirubin: 0.2 mg/dL — ABNORMAL LOW (ref 0.3–1.2)
Total Protein: 6.4 g/dL — ABNORMAL LOW (ref 6.5–8.1)

## 2018-08-09 MED ORDER — DIPHENHYDRAMINE HCL 50 MG/ML IJ SOLN
25.0000 mg | Freq: Once | INTRAMUSCULAR | Status: AC
  Administered 2018-08-09: 25 mg via INTRAVENOUS

## 2018-08-09 MED ORDER — SODIUM CHLORIDE 0.9 % IV SOLN
Freq: Once | INTRAVENOUS | Status: AC
  Administered 2018-08-09: 10:00:00 via INTRAVENOUS
  Filled 2018-08-09: qty 250

## 2018-08-09 MED ORDER — SODIUM CHLORIDE 0.9 % IV SOLN
80.0000 mg/m2 | Freq: Once | INTRAVENOUS | Status: AC
  Administered 2018-08-09: 144 mg via INTRAVENOUS
  Filled 2018-08-09: qty 24

## 2018-08-09 MED ORDER — FAMOTIDINE IN NACL 20-0.9 MG/50ML-% IV SOLN
20.0000 mg | Freq: Once | INTRAVENOUS | Status: AC
  Administered 2018-08-09: 20 mg via INTRAVENOUS

## 2018-08-09 MED ORDER — SODIUM CHLORIDE 0.9 % IV SOLN
230.0000 mg | Freq: Once | INTRAVENOUS | Status: AC
  Administered 2018-08-09: 230 mg via INTRAVENOUS
  Filled 2018-08-09: qty 23

## 2018-08-09 MED ORDER — FAMOTIDINE IN NACL 20-0.9 MG/50ML-% IV SOLN
INTRAVENOUS | Status: AC
  Filled 2018-08-09: qty 50

## 2018-08-09 MED ORDER — HEPARIN SOD (PORK) LOCK FLUSH 100 UNIT/ML IV SOLN
500.0000 [IU] | Freq: Once | INTRAVENOUS | Status: AC | PRN
  Administered 2018-08-09: 500 [IU]
  Filled 2018-08-09: qty 5

## 2018-08-09 MED ORDER — SODIUM CHLORIDE 0.9% FLUSH
10.0000 mL | Freq: Once | INTRAVENOUS | Status: AC
  Administered 2018-08-09: 10 mL
  Filled 2018-08-09: qty 10

## 2018-08-09 MED ORDER — SODIUM CHLORIDE 0.9 % IV SOLN
Freq: Once | INTRAVENOUS | Status: AC
  Administered 2018-08-09: 10:00:00 via INTRAVENOUS
  Filled 2018-08-09: qty 5

## 2018-08-09 MED ORDER — SODIUM CHLORIDE 0.9% FLUSH
10.0000 mL | INTRAVENOUS | Status: DC | PRN
  Administered 2018-08-09: 10 mL
  Filled 2018-08-09: qty 10

## 2018-08-09 MED ORDER — DIPHENHYDRAMINE HCL 50 MG/ML IJ SOLN
INTRAMUSCULAR | Status: AC
  Filled 2018-08-09: qty 1

## 2018-08-09 NOTE — Progress Notes (Signed)
[place]  [phone] Fax:([phone]) [phone]    ID: Kathryn [name] DOB: 09-Jan-1961  MR#: [id number]  BWG#:[id number]  Patient Care Team: Kathryn Blitz, MD as PCP - General (Obstetrics and Gynecology) Kathryn Germany, RN as Oncology Nurse Navigator Kathryn Kaufmann, RN as Oncology Nurse Navigator Kathryn Luna, MD as Consulting Physician (General Surgery) [name], Kathryn Dad, MD as Consulting Physician (Oncology) Kathryn Rudd, MD as Consulting Physician (Radiation Oncology) OTHER MD: Kathryn [name] (OBGYN)   CHIEF COMPLAINT: Triple negative breast cancer  CURRENT TREATMENT: Neoadjuvant chemotherapy   INTERVAL HISTORY: Kathryn [name] returns today for follow-up and treatment of her triple negative breast cancer. She is receiving neoadjuvant chemotherapy.    She continues on weekly paclitaxel and carboplatin. Today is day 1 cycle 4.  She is delighted that her breast mass has disappeared.    REVIEW OF SYSTEMS: Kathryn [name] has been doing moderately well.  Her fatigue has improved slightly.  She has noted a mild taste change.  She says this started last week.  She says her feet started tingling this past week and are also cold.  She notes that this tingling is intermittent and goes across the soles of her feet, she notes that it is brief, and has resolved today.  She says that she has been more active recently than prior.  With the decrease in her fatigue, she has started intentionally exercising again in order to build up her stamina.   She is tachycardic today.  She notes her fluid intake has been decreased, and she has checked her HR outside of here and it was 76.    Otherwise, Kathryn [name] denies fever, chills, cough, chest pain, palpitations, nausea, vomiting, bowel/bladder changes.  She is keeping appropriate pandemic precautions.  A detailed ROS was otherwise non contributory.    HISTORY OF CURRENT ILLNESS: From the original intake note:  Kathryn [name] presented with a palpable  subareolar left breast lump, nipple retraction, and discharge for approximately 1 week. She underwent bilateral diagnostic mammography with tomography and left breast ultrasonography at [facility] on 04/01/2018 showing: Breast Density Category C. An irregular hyperdense mass is seen in the left subareolar region. An additional oval, circumscribed mass is seen in the inferior central aspect at posterior depth. No additional suspicious findings are identified within the remainder of the left breast. On physical exam, there is a 2-3 cm firm, fixed lump in the subareolar region on the left. Subtle skin changes and retraction is noted along the left nipple. Targeted ultrasound is performed, showing an irregular hypoechoic mass with associated vascularity at the 12 o'clock retroareolar position on the left. Overall measurements are 2.6 x 2.3 x 1.2 cm. This corresponds with the mammographic finding. Two adjacent circumscribed hypoechoic masses are identified in the deep 6 o'clock position 2 cm from the nipple. They measure 0.8 x 0.6 x 0.4 cm and 1.3 x 1.1 x 0.7 cm. There is no seated vascularity. This corresponds with the additional mammographic finding and likely represents minimally complicated cyst. Evaluation of the left axilla demonstrates a markedly enlarged abnormal lymph node with complete hilar replacement. It measures up to 5 cm in long axis dimension. No suspicious mammographic findings on the right.   Accordingly on 04/06/2018 she proceeded to biopsy of the left breast area in question. The pathology from this procedure showed ([id number]): invasive ductal carcinoma, grade III, with lymphovascular invasion. Prognostic indicators significant for: estrogen receptor, 0% negative and progesterone receptor, 0% negative. Proliferation marker Ki67 at 70%. HER2 equivocal (  2+) by immunohistochemistry, but negative by FISH (print3ed report pending).  On the same day she underwent a biopsy of the left axillary  lymph node on 04/06/2018 showing ([id number]): metastatic carcinoma in 1 of 1 lymph node (1/1).   The patient's subsequent history is as detailed below.   PAST MEDICAL HISTORY: Past Medical History:  Diagnosis Date   Breast cancer ([place])    stage 3 - left   Family history of kidney cancer    Family history of melanoma      PAST SURGICAL HISTORY: Past Surgical History:  Procedure Laterality Date   CESAREAN SECTION     x3   PORTACATH PLACEMENT N/A 04/28/2018   Procedure: INSERTION PORT-A-CATH WITH ULTRASOUND;  Surgeon: Kathryn Luna, MD;  Location: [facility] OR;  Service: General;  Laterality: N/A;     FAMILY HISTORY: Family History  Problem Relation Age of Onset   Kidney cancer Sister 38       d. 12   Lung cancer Paternal Uncle    Melanoma Sister    Melanoma Sister    Matsue's father died from congestive heart failure at age 6. Patients' mother is 17 as of 03/2018. The patient has 1 brother and 3 sisters. Patient denies anyone in her family having breast, ovarian, prostate, or pancreatic cancer. Kathryn [name]'s sister, Kathryn [name], was diagnosed with Kidney Cancer at 30. Kathryn [name] has an uncle and a cousin that were diagnosed with lung cancer, but they were both heavy smokers.    GYNECOLOGIC HISTORY:  No LMP recorded. Patient is postmenopausal. 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 Her husband, Kathryn [name], owns [employer]. Together, they have three children, Kathryn [name], Kathryn [name], and Kathryn [name]. 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 Kathryn [name] is 43, lives in [place], and is a Equities trader at [employer]. 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   ADVANCED DIRECTIVES: Kathryn [name]'s husband, Kathryn [name], is automatically her healthcare power of attorney.    HEALTH MAINTENANCE: Social History   Tobacco Use   Smoking status: Never Smoker   Smokeless tobacco: Never Used  Substance Use Topics   Alcohol use: Yes    Comment: socially   Drug use: Never    Colonoscopy: no  PAP: 2015  Bone density: no   No Known Allergies  Current Outpatient Medications  Medication Sig Dispense Refill   Docusate Sodium (COLACE PO) Take by mouth as needed.     ibuprofen (ADVIL,MOTRIN) 800 MG tablet Take 1 tablet (800 mg total) by mouth every 8 (eight) hours as needed. 30 tablet 0   lidocaine-prilocaine (EMLA) cream Apply to affected area once 30 g 3   loratadine (CLARITIN) 10 MG tablet Take 1 tablet (10 mg total) by mouth daily.     LORazepam (ATIVAN) 0.5 MG tablet Take 1 tablet (0.5 mg total) by mouth at bedtime as needed (Nausea or vomiting). 30 tablet 0   magic mouthwash w/lidocaine SOLN swish, gargle & spit 5-10 MILLILITER EVERY 6 HOURS AS NEEDED     omeprazole (PRILOSEC) 20 MG capsule Take 1 capsule (20 mg total) by mouth daily as needed. 30 capsule 3   ondansetron (ZOFRAN) 8 MG tablet Take  0.5-1 tablets (4-8 mg total) by mouth 3 (three) times daily as needed for nausea or vomiting. 20 tablet 0   No current facility-administered medications for this visit.      OBJECTIVE:  Vitals:   08/09/18 0916  BP: 126/78  Pulse: (!) 116  Resp: 18  Temp: 98 F (36.7 C)  SpO2: 100%     Body mass index is 25.58 kg/m.   Wt Readings from Last 3 Encounters:  08/09/18 149 lb (67.6 kg)  07/26/18 148 lb 14.4 oz (67.5 kg)  07/20/18 151 lb 1.6 oz (68.5 kg)  ECOG FS:1 GENERAL: Patient is a well appearing female in no acute distress HEENT:  Sclerae anicteric.  Oropharynx clear and moist. No ulcerations or evidence of oropharyngeal candidiasis. Neck is supple.  NODES:  No cervical, supraclavicular, or axillary lymphadenopathy palpated.    BREAST EXAM: unable to palpate left breast mass LUNGS:  Clear to auscultation bilaterally.  No wheezes or rhonchi. HEART:  Regular rate and rhythm. No murmur appreciated. ABDOMEN:  Soft, nontender.  Positive, normoactive bowel sounds. No organomegaly palpated. MSK:  No focal spinal tenderness to palpation. Full range of motion bilaterally in the upper extremities. EXTREMITIES:  No peripheral edema.   SKIN:  Clear with no obvious rashes or skin changes. No nail dyscrasia. NEURO:  Nonfocal. Well oriented.  Appropriate affect.     LAB RESULTS:  CMP     Component Value Date/Time   NA 138 08/02/2018 0834   K 3.9 08/02/2018 0834   CL 106 08/02/2018 0834   CO2 23 08/02/2018 0834   GLUCOSE 97 08/02/2018 0834   BUN 10 08/02/2018 0834   CREATININE 0.62 08/02/2018 0834   CALCIUM 8.9 08/02/2018 0834   PROT 6.4 (L) 08/02/2018 0834   ALBUMIN 3.5 08/02/2018 0834   AST 29 08/02/2018 0834   ALT 41 08/02/2018 0834   ALKPHOS 72 08/02/2018 0834   BILITOT 0.3 08/02/2018 0834   GFRNONAA >60 08/02/2018 0834   GFRAA >60 08/02/2018 0834    No results found for: TOTALPROTELP, ALBUMINELP, A1GS, A2GS, BETS, BETA2SER, GAMS, MSPIKE, SPEI  No results found for: KPAFRELGTCHN, LAMBDASER, KAPLAMBRATIO  Lab Results  Component Value Date   WBC 2.0 (L) 08/09/2018   NEUTROABS 1.3 (L) 08/09/2018   HGB 8.7 (L) 08/09/2018   HCT 26.7 (L) 08/09/2018   MCV 102.3 (H) 08/09/2018   PLT 176 08/09/2018    '@LASTCHEMISTRY' @  No results found for: LABCA2  No components found for: [id number]  No results for input(s): INR in the last 168 hours.  No results found for: LABCA2  No results found for: FMB846  No results found for: KZL935  No results found for: TSV779  No results found for: CA2729  No components found for: HGQUANT  No results found for: CEA1 / No results found for: CEA1   No results found for: AFPTUMOR  No results found for: CHROMOGRNA  No results found for: PSA1  Appointment on  08/09/2018  Component Date Value Ref Range Status   WBC 08/09/2018 2.0* 4.0 - 10.5 K/uL Final   RBC 08/09/2018 2.61* 3.87 - 5.11 MIL/uL Final   Hemoglobin 08/09/2018 8.7* 12.0 - 15.0 g/dL Final   HCT 08/09/2018 26.7* 36.0 - 46.0 % Final   MCV 08/09/2018 102.3* 80.0 - 100.0 fL Final   MCH 08/09/2018 33.3  26.0 - 34.0 pg Final   MCHC 08/09/2018 32.6  30.0 - 36.0 g/dL Final   RDW 08/09/2018 17.5* 11.5 - 15.5 % Final   Platelets  08/09/2018 176  150 - 400 K/uL Final   nRBC 08/09/2018 0.0  0.0 - 0.2 % Final   Neutrophils Relative % 08/09/2018 61  % Final   Neutro Abs 08/09/2018 1.3* 1.7 - 7.7 K/uL Final   Lymphocytes Relative 08/09/2018 23  % Final   Lymphs Abs 08/09/2018 0.5* 0.7 - 4.0 K/uL Final   Monocytes Relative 08/09/2018 13  % Final   Monocytes Absolute 08/09/2018 0.3  0.1 - 1.0 K/uL Final   Eosinophils Relative 08/09/2018 1  % Final   Eosinophils Absolute 08/09/2018 0.0  0.0 - 0.5 K/uL Final   Basophils Relative 08/09/2018 1  % Final   Basophils Absolute 08/09/2018 0.0  0.0 - 0.1 K/uL Final   Immature Granulocytes 08/09/2018 1  % Final   Abs Immature Granulocytes 08/09/2018 0.02  0.00 - 0.07 K/uL Final   Performed at [facility], [place] [address], [place], [place] [address]    (this displays the last labs from the last 3 days)  No results found for: TOTALPROTELP, ALBUMINELP, A1GS, A2GS, BETS, BETA2SER, GAMS, MSPIKE, SPEI (this displays SPEP labs)  No results found for: KPAFRELGTCHN, LAMBDASER, KAPLAMBRATIO (kappa/lambda light chains)  No results found for: HGBA, HGBA2QUANT, HGBFQUANT, HGBSQUAN (Hemoglobinopathy evaluation)   No results found for: LDH  No results found for: IRON, TIBC, IRONPCTSAT (Iron and TIBC)  No results found for: FERRITIN  Urinalysis No results found for: COLORURINE, APPEARANCEUR, LABSPEC, PHURINE, GLUCOSEU, HGBUR, BILIRUBINUR, KETONESUR, PROTEINUR, UROBILINOGEN, NITRITE, LEUKOCYTESUR   STUDIES:  No  results found.  ELIGIBLE FOR AVAILABLE RESEARCH PROTOCOL: G4010   ASSESSMENT: 58 y.o. [place], [place] woman status post left breast upper outer quadrant biopsy 04/06/2018 for a clinical T2 N2, stage IIIC invasive ductal carcinoma, grade 3, triple negative, with an MIB-1 of 70%  (a) breast MRI 04/19/2018 shows a 5 cm central breast lesion with multiple satellite nodules and greater then 3 abnormal axillary lymph nodes  (b) baseline echocardiogram 04/25/2018 shows an ejection fraction in the 60-65% range  (c) chest CT scan and bone scan showed no metastatic disease.  Nonspecific rib changes will need to be followed  (1) neoadjuvant chemotherapy consisting of doxorubicin and cyclophosphamide given every 21 days x4, starting 05/03/2018, completed 06/28/2018, to be followed by weekly paclitaxel and carboplatin x12 starting 07/20/2018   (a) Doxorubicin and Cyclophosphamide dose reduced for cycles 3 and 4 due to neutropenia  (b) dose dense changed to every 3 weeks for doxorubicin/cyclophosphamide secondary to cytopenias and side effects  (2) definitive surgery to follow  (3) adjuvant radiation after surgery  (4) genetics testing 04/27/2018: No pathogenic mutations.The Multi-Gene Panel offered by [facility] includes sequencing and/or deletion duplication testing of the following 85 genes: AIP, ALK, APC, ATM, AXIN2,BAP1,  BARD1, BLM, BMPR1A, BRCA1, BRCA2, BRIP1, CASR, CDC73, CDH1, CDK4, CDKN1B, CDKN1C, CDKN2A (p14ARF), CDKN2A (p16INK4a), CEBPA, CHEK2, CTNNA1, DICER1, DIS3L2, EGFR (c.2369C>T, p.Thr790Met variant only), EPCAM (Deletion/duplication testing only), FH, FLCN, GATA2, GPC3, GREM1 (Promoter region deletion/duplication testing only), HOXB13 (c.251G>A, p.Gly84Glu), HRAS, KIT, MAX, MEN1, MET, MITF (c.952G>A, p.Glu318Lys variant only), MLH1, MSH2, MSH3, MSH6, MUTYH, NBN, NF1, NF2, NTHL1, PALB2, PDGFRA, PHOX2B, PMS2, POLD1, POLE, POT1, PRKAR1A, PTCH1, PTEN, RAD50, RAD51C, RAD51D, RB1, RECQL4, RET, RNF43, RUNX1,  SDHAF2, SDHA (sequence changes only), SDHB, SDHC, SDHD, SMAD4, SMARCA4, SMARCB1, SMARCE1, STK11, SUFU, TERC, TERT, TMEM127, TP53, TSC1, TSC2, VHL, WRN and WT1.   PLAN: Kathryn [name] is doing well today.  Her ANC is 1.3 today.  She will proceed with Paclitaxel and Carboplatin, however she will need  to receive Granix on days 3 and 4 following her treatment.  I reviewed with her the purpose of Granix, and hopefully this will keep her on schedule to continue with her chemotherapy.    Kathryn [name] and I reviewed the issue of the tingling.  This has resolved today, but I instructed her to keep a close eye on this.  If it returns, or persists, we may need to consider adjusting or stopping chemotherapy.  She understands this and notes that she will let me know.  Due to this issue, I will see her back next week as well to monitor this closely.   In regards to her elevated heart rate, I question whether or not it may be related to dehydration.  She is not drinking in enough fluids.  I recommended that she increase her fluid intake.  She will receive some hydration IV today in clinic.  Tachycardia can happen with the Paclitaxel, so Cambell is going to keep a log of her heart rate at home and let me know if it goes up again, and we will also review it at her next appointment.     Mariaelena will return in one week for labs, f/u, and her next treatment.  She knows to call for any other issue that may develop before the next visit.  A total of (30) minutes of face-to-face time was spent with this patient with greater than 50% of that time in counseling and care-coordination.   Wilber Bihari, NP  08/09/18 9:32 AM Medical Oncology and Hematology [facility] [address] [place], [place] [address] Tel. [phone]    Fax. [phone]

## 2018-08-09 NOTE — Progress Notes (Signed)
Per NP ok to treat with ANC of 1.3.  Order for fluids today, patient does not have to get them all.  Please contact NP with heart rate during treatment.

## 2018-08-09 NOTE — Patient Instructions (Addendum)
[facility] Discharge Instructions for Patients Receiving Chemotherapy  Today you received the following chemotherapy agents Carboplatin and Taxol.  To help prevent nausea and vomiting after your treatment, we encourage you to take your nausea medication as directed.    If you develop nausea and vomiting that is not controlled by your nausea medication, call the clinic.   BELOW ARE SYMPTOMS THAT SHOULD BE REPORTED IMMEDIATELY:  *FEVER GREATER THAN 100.5 F  *CHILLS WITH OR WITHOUT FEVER  NAUSEA AND VOMITING THAT IS NOT CONTROLLED WITH YOUR NAUSEA MEDICATION  *UNUSUAL SHORTNESS OF BREATH  *UNUSUAL BRUISING OR BLEEDING  TENDERNESS IN MOUTH AND THROAT WITH OR WITHOUT PRESENCE OF ULCERS  *URINARY PROBLEMS  *BOWEL PROBLEMS  UNUSUAL RASH Items with * indicate a potential emergency and should be followed up as soon as possible.  Feel free to call the clinic you have any questions or concerns. The clinic phone number is [phone].  Please show the [facility] at check-in to the Emergency Department and triage nurse.   Tbo-Filgrastim injection What is this medicine? TBO-FILGRASTIM (T B O fil GRA stim) is a granulocyte colony-stimulating factor that stimulates the growth of neutrophils, a type of white blood cell important in the body's fight against infection. It is used to reduce the incidence of fever and infection in patients with certain types of cancer who are receiving chemotherapy that affects the bone marrow. This medicine may be used for other purposes; ask your health care provider or pharmacist if you have questions. COMMON BRAND NAME(S): Granix What should I tell my health care provider before I take this medicine? They need to know if you have any of these conditions: -bone scan or tests planned -kidney disease -sickle cell anemia -an unusual or allergic reaction to tbo-filgrastim, filgrastim, pegfilgrastim, other medicines, foods, dyes, or  preservatives -pregnant or trying to get pregnant -breast-feeding How should I use this medicine? This medicine is for injection under the skin. If you get this medicine at home, you will be taught how to prepare and give this medicine. Refer to the Instructions for Use that come with your medication packaging. Use exactly as directed. Take your medicine at regular intervals. Do not take your medicine more often than directed. It is important that you put your used needles and syringes in a special sharps container. Do not put them in a trash can. If you do not have a sharps container, call your pharmacist or healthcare provider to get one. Talk to your pediatrician regarding the use of this medicine in children. While this drug may be prescribed for children as young as 59 month of age for selected conditions, precautions do apply. Overdosage: If you think you have taken too much of this medicine contact a poison control center or emergency room at once. NOTE: This medicine is only for you. Do not share this medicine with others. What if I miss a dose? It is important not to miss your dose. Call your doctor or health care professional if you miss a dose. What may interact with this medicine? This medicine may interact with the following medications: -medicines that may cause a release of neutrophils, such as lithium This list may not describe all possible interactions. Give your health care provider a list of all the medicines, herbs, non-prescription drugs, or dietary supplements you use. Also tell them if you smoke, drink alcohol, or use illegal drugs. Some items may interact with your medicine. What should I watch for while  using this medicine? You may need blood work done while you are taking this medicine. What side effects may I notice from receiving this medicine? Side effects that you should report to your doctor or health care professional as soon as possible: -allergic reactions like  skin rash, itching or hives, swelling of the face, lips, or tongue -back pain -blood in the urine -dark urine -dizziness -fast heartbeat -feeling faint -shortness of breath or breathing problems -signs and symptoms of infection like fever or chills; cough; or sore throat -signs and symptoms of kidney injury like trouble passing urine or change in the amount of urine -stomach or side pain, or pain at the shoulder -sweating -swelling of the legs, ankles, or abdomen -tiredness Side effects that usually do not require medical attention (report to your doctor or health care professional if they continue or are bothersome): -bone pain -diarrhea -headache -muscle pain -vomiting This list may not describe all possible side effects. Call your doctor for medical advice about side effects. You may report side effects to FDA at 1-800-FDA-1088. Where should I keep my medicine? Keep out of the reach of children. Store in a refrigerator between 2 and 8 degrees C (36 and 46 degrees F). Keep in carton to protect from light. Throw away this medicine if it is left out of the refrigerator for more than 5 consecutive days. Throw away any unused medicine after the expiration date. NOTE: This sheet is a summary. It may not cover all possible information. If you have questions about this medicine, talk to your doctor, pharmacist, or health care provider.  2019 Elsevier/Gold Standard (2016-09-29 16:56:18)

## 2018-08-09 NOTE — Progress Notes (Signed)
Mendel Ryder NP sent a message to scheduling this am - NO appts made for later this week for Granix shots. Patient aware scheduling should call her and she has 2 shot appts the NP expects this week and I provided her information about Granix today.

## 2018-08-11 ENCOUNTER — Encounter: Payer: Self-pay | Admitting: Adult Health

## 2018-08-11 ENCOUNTER — Other Ambulatory Visit: Payer: Self-pay

## 2018-08-11 ENCOUNTER — Telehealth: Payer: Self-pay | Admitting: Adult Health

## 2018-08-11 ENCOUNTER — Inpatient Hospital Stay: Payer: BC Managed Care – PPO

## 2018-08-11 VITALS — BP 128/72 | HR 102 | Temp 98.0°F | Resp 18

## 2018-08-11 DIAGNOSIS — Z171 Estrogen receptor negative status [ER-]: Secondary | ICD-10-CM

## 2018-08-11 DIAGNOSIS — Z95828 Presence of other vascular implants and grafts: Secondary | ICD-10-CM

## 2018-08-11 DIAGNOSIS — C50412 Malignant neoplasm of upper-outer quadrant of left female breast: Secondary | ICD-10-CM | POA: Diagnosis not present

## 2018-08-11 MED ORDER — TBO-FILGRASTIM 300 MCG/0.5ML ~~LOC~~ SOSY
PREFILLED_SYRINGE | SUBCUTANEOUS | Status: AC
  Filled 2018-08-11: qty 0.5

## 2018-08-11 MED ORDER — TBO-FILGRASTIM 300 MCG/0.5ML ~~LOC~~ SOSY
300.0000 ug | PREFILLED_SYRINGE | Freq: Once | SUBCUTANEOUS | Status: AC
  Administered 2018-08-11: 15:00:00 300 ug via SUBCUTANEOUS

## 2018-08-11 NOTE — Patient Instructions (Signed)
Tbo-Filgrastim injection What is this medicine? TBO-FILGRASTIM (T B O fil GRA stim) is a granulocyte colony-stimulating factor that stimulates the growth of neutrophils, a type of white blood cell important in the body's fight against infection. It is used to reduce the incidence of fever and infection in patients with certain types of cancer who are receiving chemotherapy that affects the bone marrow. This medicine may be used for other purposes; ask your health care provider or pharmacist if you have questions. COMMON BRAND NAME(S): Granix What should I tell my health care provider before I take this medicine? They need to know if you have any of these conditions: -bone scan or tests planned -kidney disease -sickle cell anemia -an unusual or allergic reaction to tbo-filgrastim, filgrastim, pegfilgrastim, other medicines, foods, dyes, or preservatives -pregnant or trying to get pregnant -breast-feeding How should I use this medicine? This medicine is for injection under the skin. If you get this medicine at home, you will be taught how to prepare and give this medicine. Refer to the Instructions for Use that come with your medication packaging. Use exactly as directed. Take your medicine at regular intervals. Do not take your medicine more often than directed. It is important that you put your used needles and syringes in a special sharps container. Do not put them in a trash can. If you do not have a sharps container, call your pharmacist or healthcare provider to get one. Talk to your pediatrician regarding the use of this medicine in children. While this drug may be prescribed for children as young as 1 month of age for selected conditions, precautions do apply. Overdosage: If you think you have taken too much of this medicine contact a poison control center or emergency room at once. NOTE: This medicine is only for you. Do not share this medicine with others. What if I miss a dose? It is  important not to miss your dose. Call your doctor or health care professional if you miss a dose. What may interact with this medicine? This medicine may interact with the following medications: -medicines that may cause a release of neutrophils, such as lithium This list may not describe all possible interactions. Give your health care provider a list of all the medicines, herbs, non-prescription drugs, or dietary supplements you use. Also tell them if you smoke, drink alcohol, or use illegal drugs. Some items may interact with your medicine. What should I watch for while using this medicine? You may need blood work done while you are taking this medicine. What side effects may I notice from receiving this medicine? Side effects that you should report to your doctor or health care professional as soon as possible: -allergic reactions like skin rash, itching or hives, swelling of the face, lips, or tongue -back pain -blood in the urine -dark urine -dizziness -fast heartbeat -feeling faint -shortness of breath or breathing problems -signs and symptoms of infection like fever or chills; cough; or sore throat -signs and symptoms of kidney injury like trouble passing urine or change in the amount of urine -stomach or side pain, or pain at the shoulder -sweating -swelling of the legs, ankles, or abdomen -tiredness Side effects that usually do not require medical attention (report to your doctor or health care professional if they continue or are bothersome): -bone pain -diarrhea -headache -muscle pain -vomiting This list may not describe all possible side effects. Call your doctor for medical advice about side effects. You may report side effects to FDA at   1-800-FDA-1088. Where should I keep my medicine? Keep out of the reach of children. Store in a refrigerator between 2 and 8 degrees C (36 and 46 degrees F). Keep in carton to protect from light. Throw away this medicine if it is left out  of the refrigerator for more than 5 consecutive days. Throw away any unused medicine after the expiration date. NOTE: This sheet is a summary. It may not cover all possible information. If you have questions about this medicine, talk to your doctor, pharmacist, or health care provider.  2019 Elsevier/Gold Standard (2016-09-29 16:56:18)  

## 2018-08-11 NOTE — Telephone Encounter (Signed)
I talk with patient regarding injection 6/18

## 2018-08-12 ENCOUNTER — Other Ambulatory Visit: Payer: Self-pay

## 2018-08-12 ENCOUNTER — Inpatient Hospital Stay: Payer: BC Managed Care – PPO

## 2018-08-12 ENCOUNTER — Ambulatory Visit: Payer: BC Managed Care – PPO

## 2018-08-12 VITALS — BP 116/70 | HR 106 | Temp 98.7°F | Resp 18

## 2018-08-12 DIAGNOSIS — C50412 Malignant neoplasm of upper-outer quadrant of left female breast: Secondary | ICD-10-CM | POA: Diagnosis not present

## 2018-08-12 DIAGNOSIS — Z95828 Presence of other vascular implants and grafts: Secondary | ICD-10-CM

## 2018-08-12 MED ORDER — TBO-FILGRASTIM 300 MCG/0.5ML ~~LOC~~ SOSY
300.0000 ug | PREFILLED_SYRINGE | Freq: Once | SUBCUTANEOUS | Status: AC
  Administered 2018-08-12: 300 ug via SUBCUTANEOUS

## 2018-08-12 MED ORDER — TBO-FILGRASTIM 300 MCG/0.5ML ~~LOC~~ SOSY
PREFILLED_SYRINGE | SUBCUTANEOUS | Status: AC
  Filled 2018-08-12: qty 0.5

## 2018-08-15 NOTE — Progress Notes (Signed)
[facility]  [phone] [phone]    ID: [name] DOB: Nov 16, 1960  MR#: [id number]  JYN#:[id number]  Patient Care Team: Kandace Blitz, MD as PCP - General (Obstetrics and Gynecology) Rockwell Germany, RN as Oncology Nurse Navigator Mauro Kaufmann, RN as Oncology Nurse Navigator Erroll Luna, MD as Consulting Physician (General Surgery) Magrinat, Virgie Dad, MD as Consulting Physician (Oncology) Kyung Rudd, MD as Consulting Physician (Radiation Oncology) OTHER MD: Rana Snare (OBGYN)   CHIEF COMPLAINT: Triple negative breast cancer  CURRENT TREATMENT: Neoadjuvant chemotherapy   INTERVAL HISTORY: Quantina returns today for follow-up and treatment of her triple negative breast cancer. She is receiving neoadjuvant chemotherapy.    She continues on weekly paclitaxel and carboplatin. Today is day 1 cycle 5.    REVIEW OF SYSTEMS: Giamarie is doing well today.  She was more fatigued this week.  She received Granix on days 3 and 4 of treatment.  This caused some bone achiness.  She wasn't able to be very active, however started feeling better yesterday and today.  She is walking twice a day and she is working on increasing her water intake.  She is more consistently drinking 4 bottles of water per day.  Last week, she had noticed some intermittent tingling in her feet when walking.  This has since resolved as she has increased her physical activity.  Brittay denies fevers, chills, shortness of breath, chest pain, palpitations.  She hasn't had any nausea or vomiting.  Kiahna coughs as related to her allergies, and claritin helps with this.  She continues to have some mild constipation relieved with colace.  She denies any diarrhea, or bladder concerns.  A detailed ROS was otherwise non contributory.   HISTORY OF CURRENT ILLNESS: From the original intake note:  Kathryn [name] presented with a palpable subareolar left breast lump, nipple retraction, and  discharge for approximately 1 week. She underwent bilateral diagnostic mammography with tomography and left breast ultrasonography at [facility] on 04/01/2018 showing: Breast Density Category C. An irregular hyperdense mass is seen in the left subareolar region. An additional oval, circumscribed mass is seen in the inferior central aspect at posterior depth. No additional suspicious findings are identified within the remainder of the left breast. On physical exam, there is a 2-3 cm firm, fixed lump in the subareolar region on the left. Subtle skin changes and retraction is noted along the left nipple. Targeted ultrasound is performed, showing an irregular hypoechoic mass with associated vascularity at the 12 o'clock retroareolar position on the left. Overall measurements are 2.6 x 2.3 x 1.2 cm. This corresponds with the mammographic finding. Two adjacent circumscribed hypoechoic masses are identified in the deep 6 o'clock position 2 cm from the nipple. They measure 0.8 x 0.6 x 0.4 cm and 1.3 x 1.1 x 0.7 cm. There is no seated vascularity. This corresponds with the additional mammographic finding and likely represents minimally complicated cyst. Evaluation of the left axilla demonstrates a markedly enlarged abnormal lymph node with complete hilar replacement. It measures up to 5 cm in long axis dimension. No suspicious mammographic findings on the right.   Accordingly on 04/06/2018 she proceeded to biopsy of the left breast area in question. The pathology from this procedure showed ([id number]): invasive ductal carcinoma, grade III, with lymphovascular invasion. Prognostic indicators significant for: estrogen receptor, 0% negative and progesterone receptor, 0% negative. Proliferation marker Ki67 at 70%. HER2 equivocal (2+) by immunohistochemistry, but negative by FISH (print3ed report pending).  On  the same day she underwent a biopsy of the left axillary lymph node on 04/06/2018 showing ([id number]):  metastatic carcinoma in 1 of 1 lymph node (1/1).   The patient's subsequent history is as detailed below.   PAST MEDICAL HISTORY: Past Medical History:  Diagnosis Date   Breast cancer ([place])    stage 3 - left   Family history of kidney cancer    Family history of melanoma      PAST SURGICAL HISTORY: Past Surgical History:  Procedure Laterality Date   CESAREAN SECTION     x3   PORTACATH PLACEMENT N/A 04/28/2018   Procedure: INSERTION PORT-A-CATH WITH ULTRASOUND;  Surgeon: Erroll Luna, MD;  Location: [facility] OR;  Service: General;  Laterality: N/A;     FAMILY HISTORY: Family History  Problem Relation Age of Onset   Kidney cancer Sister 35       d. 82   Lung cancer Paternal Uncle    Melanoma Sister    Melanoma Sister    Yamina's father died from congestive heart failure at age 51. Patients' mother is 28 as of 03/2018. The patient has 1 brother and 3 sisters. Patient denies anyone in her family having breast, ovarian, prostate, or pancreatic cancer. Elaynah's sister, [name], was diagnosed with Kidney Cancer at 29. Vonda has an uncle and a cousin that were diagnosed with lung cancer, but they were both heavy smokers.    GYNECOLOGIC HISTORY:  No LMP recorded. Patient is postmenopausal. 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 Her husband, Taffy Delconte, owns [employer]. Together, they have three children, [name], [place], and [place]. Danniela Mcbrearty is 55, lives in [place], and works as a Theme park manager for the [employer]. Calin Ellery is 84, lives in [place], and is a Equities trader at [employer]. Tesslyn Baumert is 27, is a Ship broker, and lives at home with [name] and [name].   ADVANCED DIRECTIVES: Samera's  husband, Halana Deisher, is automatically her healthcare power of attorney.    HEALTH MAINTENANCE: Social History   Tobacco Use   Smoking status: Never Smoker   Smokeless tobacco: Never Used  Substance Use Topics   Alcohol use: Yes    Comment: socially   Drug use: Never    Colonoscopy: no  PAP: 2015  Bone density: no   No Known Allergies  Current Outpatient Medications  Medication Sig Dispense Refill   dexamethasone (DECADRON) 4 MG tablet Take 4 mg by mouth 2 (two) times daily with a meal. Taking BID the first two days after chemotherapy     Docusate Sodium (COLACE PO) Take by mouth as needed.     ibuprofen (ADVIL,MOTRIN) 800 MG tablet Take 1 tablet (800 mg total) by mouth every 8 (eight) hours as needed. 30 tablet 0   lidocaine-prilocaine (EMLA) cream Apply to affected area once 30 g 3   loratadine (CLARITIN) 10 MG tablet Take 1 tablet (10 mg total) by mouth daily.     LORazepam (ATIVAN) 0.5 MG tablet Take 1 tablet (0.5 mg total) by mouth at bedtime as needed (Nausea or vomiting). 30 tablet 0   magic mouthwash w/lidocaine SOLN swish, gargle & spit 5-10 MILLILITER EVERY 6 HOURS AS NEEDED     omeprazole (PRILOSEC) 20 MG capsule Take 1 capsule (20 mg  total) by mouth daily as needed. 30 capsule 3   ondansetron (ZOFRAN) 8 MG tablet Take 0.5-1 tablets (4-8 mg total) by mouth 3 (three) times daily as needed for nausea or vomiting. 20 tablet 0   Current Facility-Administered Medications  Medication Dose Route Frequency Provider Last Rate Last Dose   Tbo-Filgrastim (GRANIX) injection 480 mcg  480 mcg Subcutaneous Once Gardenia Phlegm, NP         OBJECTIVE:  Vitals:   08/16/18 0846  BP: 117/73  Pulse: (!) 105  Resp: 17  Temp: 98.7 F (37.1 C)  SpO2: 100%     Body mass index is 25.54 kg/m.   Wt Readings from Last 3 Encounters:  08/16/18 148 lb 12.8 oz (67.5 kg)  08/09/18 149 lb (67.6 kg)  07/26/18 148 lb 14.4 oz (67.5 kg)  ECOG FS:1 GENERAL: Patient is a  well appearing female in no acute distress HEENT:  Sclerae anicteric.  Oropharynx clear and moist. No ulcerations or evidence of oropharyngeal candidiasis. Neck is supple.  NODES:  No cervical, supraclavicular, or axillary lymphadenopathy palpated.  BREAST EXAM: deferred today LUNGS:  Clear to auscultation bilaterally.  No wheezes or rhonchi. HEART:  Regular rate and rhythm. No murmur appreciated. ABDOMEN:  Soft, nontender.  Positive, normoactive bowel sounds. No organomegaly palpated. MSK:  No focal spinal tenderness to palpation. Full range of motion bilaterally in the upper extremities. EXTREMITIES:  No peripheral edema.   SKIN:  Clear with no obvious rashes or skin changes. No nail dyscrasia. NEURO:  Nonfocal. Well oriented.  Appropriate affect.     LAB RESULTS:  CMP     Component Value Date/Time   NA 139 08/09/2018 0848   K 3.6 08/09/2018 0848   CL 107 08/09/2018 0848   CO2 22 08/09/2018 0848   GLUCOSE 98 08/09/2018 0848   BUN 10 08/09/2018 0848   CREATININE 0.78 08/09/2018 0848   CREATININE 0.62 08/02/2018 0834   CALCIUM 9.0 08/09/2018 0848   PROT 6.4 (L) 08/09/2018 0848   ALBUMIN 3.5 08/09/2018 0848   AST 31 08/09/2018 0848   AST 29 08/02/2018 0834   ALT 48 (H) 08/09/2018 0848   ALT 41 08/02/2018 0834   ALKPHOS 74 08/09/2018 0848   BILITOT 0.2 (L) 08/09/2018 0848   BILITOT 0.3 08/02/2018 0834   GFRNONAA >60 08/09/2018 0848   GFRNONAA >60 08/02/2018 0834   GFRAA >60 08/09/2018 0848   GFRAA >60 08/02/2018 0834    No results found for: TOTALPROTELP, ALBUMINELP, A1GS, A2GS, BETS, BETA2SER, GAMS, MSPIKE, SPEI  No results found for: KPAFRELGTCHN, LAMBDASER, KAPLAMBRATIO  Lab Results  Component Value Date   WBC 1.8 (L) 08/16/2018   NEUTROABS 0.9 (L) 08/16/2018   HGB 8.5 (L) 08/16/2018   HCT 25.8 (L) 08/16/2018   MCV 101.2 (H) 08/16/2018   PLT 82 (L) 08/16/2018    '@LASTCHEMISTRY' @  No results found for: LABCA2  No components found for: BTDHRC163  No  results for input(s): INR in the last 168 hours.  No results found for: LABCA2  No results found for: AGT364  No results found for: WOE321  No results found for: YYQ825  No results found for: CA2729  No components found for: HGQUANT  No results found for: CEA1 / No results found for: CEA1   No results found for: AFPTUMOR  No results found for: CHROMOGRNA  No results found for: PSA1  Appointment on 08/16/2018  Component Date Value Ref Range Status   WBC 08/16/2018 1.8* 4.0 - 10.5 K/uL  Final   RBC 08/16/2018 2.55* 3.87 - 5.11 MIL/uL Final   Hemoglobin 08/16/2018 8.5* 12.0 - 15.0 g/dL Final   HCT 08/16/2018 25.8* 36.0 - 46.0 % Final   MCV 08/16/2018 101.2* 80.0 - 100.0 fL Final   MCH 08/16/2018 33.3  26.0 - 34.0 pg Final   MCHC 08/16/2018 32.9  30.0 - 36.0 g/dL Final   RDW 08/16/2018 16.5* 11.5 - 15.5 % Final   Platelets 08/16/2018 82* 150 - 400 K/uL Final   nRBC 08/16/2018 0.0  0.0 - 0.2 % Final   Neutrophils Relative % 08/16/2018 51  % Final   Neutro Abs 08/16/2018 0.9* 1.7 - 7.7 K/uL Final   Lymphocytes Relative 08/16/2018 31  % Final   Lymphs Abs 08/16/2018 0.6* 0.7 - 4.0 K/uL Final   Monocytes Relative 08/16/2018 15  % Final   Monocytes Absolute 08/16/2018 0.3  0.1 - 1.0 K/uL Final   Eosinophils Relative 08/16/2018 1  % Final   Eosinophils Absolute 08/16/2018 0.0  0.0 - 0.5 K/uL Final   Basophils Relative 08/16/2018 1  % Final   Basophils Absolute 08/16/2018 0.0  0.0 - 0.1 K/uL Final   RBC Morphology 08/16/2018 ovalos   Corrected   Immature Granulocytes 08/16/2018 1  % Final   Abs Immature Granulocytes 08/16/2018 0.01  0.00 - 0.07 K/uL Final   Performed at [facility], [place] [address], [place], [place] [address]    (this displays the last labs from the last 3 days)  No results found for: TOTALPROTELP, ALBUMINELP, A1GS, A2GS, BETS, BETA2SER, GAMS, MSPIKE, SPEI (this displays SPEP labs)  No results found for:  KPAFRELGTCHN, LAMBDASER, KAPLAMBRATIO (kappa/lambda light chains)  No results found for: HGBA, HGBA2QUANT, HGBFQUANT, HGBSQUAN (Hemoglobinopathy evaluation)   No results found for: LDH  No results found for: IRON, TIBC, IRONPCTSAT (Iron and TIBC)  No results found for: FERRITIN  Urinalysis No results found for: COLORURINE, APPEARANCEUR, LABSPEC, PHURINE, GLUCOSEU, HGBUR, BILIRUBINUR, KETONESUR, PROTEINUR, UROBILINOGEN, NITRITE, LEUKOCYTESUR   STUDIES:  No results found.  ELIGIBLE FOR AVAILABLE RESEARCH PROTOCOL: [id number]   ASSESSMENT: 58 y.o. [place], [place] woman status post left breast upper outer quadrant biopsy 04/06/2018 for a clinical T2 N2, stage IIIC invasive ductal carcinoma, grade 3, triple negative, with an MIB-1 of 70%  (a) breast MRI 04/19/2018 shows a 5 cm central breast lesion with multiple satellite nodules and greater then 3 abnormal axillary lymph nodes  (b) baseline echocardiogram 04/25/2018 shows an ejection fraction in the 60-65% range  (c) chest CT scan and bone scan showed no metastatic disease.  Nonspecific rib changes will need to be followed  (1) neoadjuvant chemotherapy consisting of doxorubicin and cyclophosphamide given every 21 days x4, starting 05/03/2018, completed 06/28/2018, to be followed by weekly paclitaxel and carboplatin x12 starting 07/20/2018   (a) Doxorubicin and Cyclophosphamide dose reduced for cycles 3 and 4 due to neutropenia  (b) dose dense changed to every 3 weeks for doxorubicin/cyclophosphamide secondary to cytopenias and side effects  (2) definitive surgery to follow  (3) adjuvant radiation after surgery  (4) genetics testing 04/27/2018: No pathogenic mutations.The Multi-Gene Panel offered by [facility] includes sequencing and/or deletion duplication testing of the following 85 genes: AIP, ALK, APC, ATM, AXIN2,BAP1,  BARD1, BLM, BMPR1A, BRCA1, BRCA2, BRIP1, CASR, CDC73, CDH1, CDK4, CDKN1B, CDKN1C, CDKN2A (p14ARF), CDKN2A (p16INK4a), CEBPA,  CHEK2, CTNNA1, DICER1, DIS3L2, EGFR (c.2369C>T, p.Thr790Met variant only), EPCAM (Deletion/duplication testing only), FH, FLCN, GATA2, GPC3, GREM1 (Promoter region deletion/duplication testing only), HOXB13 (c.251G>A, p.Gly84Glu), HRAS, KIT, MAX,  MEN1, MET, MITF (c.952G>A, p.Glu318Lys variant only), MLH1, MSH2, MSH3, MSH6, MUTYH, NBN, NF1, NF2, NTHL1, PALB2, PDGFRA, PHOX2B, PMS2, POLD1, POLE, POT1, PRKAR1A, PTCH1, PTEN, RAD50, RAD51C, RAD51D, RB1, RECQL4, RET, RNF43, RUNX1, SDHAF2, SDHA (sequence changes only), SDHB, SDHC, SDHD, SMAD4, SMARCA4, SMARCB1, SMARCE1, STK11, SUFU, TERC, TERT, TMEM127, TP53, TSC1, TSC2, VHL, WRN and WT1.   PLAN: Merrilee is doing moderately well.  She did have some difficulty last week after her treatment and with the Granix.  Today, Taylore's platelets are 82.  Her WBC are lower than they were last week.  Her Isabella hasn't resulted.    I reviewed Laramie's labs with Dr. Jana Hakim and he helped formulate the following plan: no chemotherapy today, granix 455mg x 3, MRI breasts to evaluate breast cancer, f/u next week to review results.  I reviewed the above in detail with Alaiah and counseled her again on role of granix.  I reviewed with her the purpose of the MRI and the concern that perhaps she may not be able to tolerate all 12 cycles of Paclitaxel and Carboplatin.  She verbalizes understanding of this.  I sent a message to [place] our breast navigators for assistance in scheduling the MRI.    RAryanawill return in one week for labs, f/u, and her next treatment.  She knows to call for any other issue that may develop before the next visit.  A total of (30) minutes of face-to-face time was spent with this patient with greater than 50% of that time in counseling and care-coordination.   LWilber Bihari NP  08/16/18 9:39 AM Medical Oncology and Hematology [facility] [place] [address]Tel. [phone]   Fax. [phone]

## 2018-08-16 ENCOUNTER — Inpatient Hospital Stay: Payer: BC Managed Care – PPO

## 2018-08-16 ENCOUNTER — Inpatient Hospital Stay (HOSPITAL_BASED_OUTPATIENT_CLINIC_OR_DEPARTMENT_OTHER): Payer: BC Managed Care – PPO | Admitting: Adult Health

## 2018-08-16 ENCOUNTER — Encounter: Payer: Self-pay | Admitting: Adult Health

## 2018-08-16 ENCOUNTER — Telehealth: Payer: Self-pay | Admitting: *Deleted

## 2018-08-16 ENCOUNTER — Other Ambulatory Visit: Payer: Self-pay

## 2018-08-16 VITALS — BP 117/73 | HR 105 | Temp 98.7°F | Resp 17 | Ht 64.0 in | Wt 148.8 lb

## 2018-08-16 DIAGNOSIS — R590 Localized enlarged lymph nodes: Secondary | ICD-10-CM

## 2018-08-16 DIAGNOSIS — Z85820 Personal history of malignant melanoma of skin: Secondary | ICD-10-CM

## 2018-08-16 DIAGNOSIS — C50412 Malignant neoplasm of upper-outer quadrant of left female breast: Secondary | ICD-10-CM

## 2018-08-16 DIAGNOSIS — Z171 Estrogen receptor negative status [ER-]: Secondary | ICD-10-CM

## 2018-08-16 DIAGNOSIS — Z8051 Family history of malignant neoplasm of kidney: Secondary | ICD-10-CM

## 2018-08-16 DIAGNOSIS — Z7689 Persons encountering health services in other specified circumstances: Secondary | ICD-10-CM | POA: Diagnosis not present

## 2018-08-16 DIAGNOSIS — K5900 Constipation, unspecified: Secondary | ICD-10-CM

## 2018-08-16 DIAGNOSIS — Z95828 Presence of other vascular implants and grafts: Secondary | ICD-10-CM

## 2018-08-16 DIAGNOSIS — Z801 Family history of malignant neoplasm of trachea, bronchus and lung: Secondary | ICD-10-CM

## 2018-08-16 DIAGNOSIS — Z5111 Encounter for antineoplastic chemotherapy: Secondary | ICD-10-CM

## 2018-08-16 DIAGNOSIS — R5383 Other fatigue: Secondary | ICD-10-CM

## 2018-08-16 DIAGNOSIS — R110 Nausea: Secondary | ICD-10-CM

## 2018-08-16 DIAGNOSIS — R000 Tachycardia, unspecified: Secondary | ICD-10-CM

## 2018-08-16 DIAGNOSIS — Z79899 Other long term (current) drug therapy: Secondary | ICD-10-CM

## 2018-08-16 LAB — CBC WITH DIFFERENTIAL/PLATELET
Abs Immature Granulocytes: 0.01 10*3/uL (ref 0.00–0.07)
Basophils Absolute: 0 10*3/uL (ref 0.0–0.1)
Basophils Relative: 1 %
Eosinophils Absolute: 0 10*3/uL (ref 0.0–0.5)
Eosinophils Relative: 1 %
HCT: 25.8 % — ABNORMAL LOW (ref 36.0–46.0)
Hemoglobin: 8.5 g/dL — ABNORMAL LOW (ref 12.0–15.0)
Immature Granulocytes: 1 %
Lymphocytes Relative: 31 %
Lymphs Abs: 0.6 10*3/uL — ABNORMAL LOW (ref 0.7–4.0)
MCH: 33.3 pg (ref 26.0–34.0)
MCHC: 32.9 g/dL (ref 30.0–36.0)
MCV: 101.2 fL — ABNORMAL HIGH (ref 80.0–100.0)
Monocytes Absolute: 0.3 10*3/uL (ref 0.1–1.0)
Monocytes Relative: 15 %
Neutro Abs: 0.9 10*3/uL — ABNORMAL LOW (ref 1.7–7.7)
Neutrophils Relative %: 51 %
Platelets: 82 10*3/uL — ABNORMAL LOW (ref 150–400)
RBC: 2.55 MIL/uL — ABNORMAL LOW (ref 3.87–5.11)
RDW: 16.5 % — ABNORMAL HIGH (ref 11.5–15.5)
WBC: 1.8 10*3/uL — ABNORMAL LOW (ref 4.0–10.5)
nRBC: 0 % (ref 0.0–0.2)

## 2018-08-16 MED ORDER — TBO-FILGRASTIM 480 MCG/0.8ML ~~LOC~~ SOSY
480.0000 ug | PREFILLED_SYRINGE | Freq: Once | SUBCUTANEOUS | Status: AC
  Administered 2018-08-16: 480 ug via SUBCUTANEOUS

## 2018-08-16 MED ORDER — SODIUM CHLORIDE 0.9% FLUSH
10.0000 mL | Freq: Once | INTRAVENOUS | Status: AC
  Administered 2018-08-16: 10 mL
  Filled 2018-08-16: qty 10

## 2018-08-16 MED ORDER — TBO-FILGRASTIM 480 MCG/0.8ML ~~LOC~~ SOSY
PREFILLED_SYRINGE | SUBCUTANEOUS | Status: AC
  Filled 2018-08-16: qty 0.8

## 2018-08-16 NOTE — Patient Instructions (Signed)
Tbo-Filgrastim injection What is this medicine? TBO-FILGRASTIM (T B O fil GRA stim) is a granulocyte colony-stimulating factor that stimulates the growth of neutrophils, a type of white blood cell important in the body's fight against infection. It is used to reduce the incidence of fever and infection in patients with certain types of cancer who are receiving chemotherapy that affects the bone marrow. This medicine may be used for other purposes; ask your health care provider or pharmacist if you have questions. COMMON BRAND NAME(S): Granix What should I tell my health care provider before I take this medicine? They need to know if you have any of these conditions: -bone scan or tests planned -kidney disease -sickle cell anemia -an unusual or allergic reaction to tbo-filgrastim, filgrastim, pegfilgrastim, other medicines, foods, dyes, or preservatives -pregnant or trying to get pregnant -breast-feeding How should I use this medicine? This medicine is for injection under the skin. If you get this medicine at home, you will be taught how to prepare and give this medicine. Refer to the Instructions for Use that come with your medication packaging. Use exactly as directed. Take your medicine at regular intervals. Do not take your medicine more often than directed. It is important that you put your used needles and syringes in a special sharps container. Do not put them in a trash can. If you do not have a sharps container, call your pharmacist or healthcare provider to get one. Talk to your pediatrician regarding the use of this medicine in children. While this drug may be prescribed for children as young as 1 month of age for selected conditions, precautions do apply. Overdosage: If you think you have taken too much of this medicine contact a poison control center or emergency room at once. NOTE: This medicine is only for you. Do not share this medicine with others. What if I miss a dose? It is  important not to miss your dose. Call your doctor or health care professional if you miss a dose. What may interact with this medicine? This medicine may interact with the following medications: -medicines that may cause a release of neutrophils, such as lithium This list may not describe all possible interactions. Give your health care provider a list of all the medicines, herbs, non-prescription drugs, or dietary supplements you use. Also tell them if you smoke, drink alcohol, or use illegal drugs. Some items may interact with your medicine. What should I watch for while using this medicine? You may need blood work done while you are taking this medicine. What side effects may I notice from receiving this medicine? Side effects that you should report to your doctor or health care professional as soon as possible: -allergic reactions like skin rash, itching or hives, swelling of the face, lips, or tongue -back pain -blood in the urine -dark urine -dizziness -fast heartbeat -feeling faint -shortness of breath or breathing problems -signs and symptoms of infection like fever or chills; cough; or sore throat -signs and symptoms of kidney injury like trouble passing urine or change in the amount of urine -stomach or side pain, or pain at the shoulder -sweating -swelling of the legs, ankles, or abdomen -tiredness Side effects that usually do not require medical attention (report to your doctor or health care professional if they continue or are bothersome): -bone pain -diarrhea -headache -muscle pain -vomiting This list may not describe all possible side effects. Call your doctor for medical advice about side effects. You may report side effects to FDA at   1-800-FDA-1088. Where should I keep my medicine? Keep out of the reach of children. Store in a refrigerator between 2 and 8 degrees C (36 and 46 degrees F). Keep in carton to protect from light. Throw away this medicine if it is left out  of the refrigerator for more than 5 consecutive days. Throw away any unused medicine after the expiration date. NOTE: This sheet is a summary. It may not cover all possible information. If you have questions about this medicine, talk to your doctor, pharmacist, or health care provider.  2019 Elsevier/Gold Standard (2016-09-29 16:56:18)  

## 2018-08-16 NOTE — Telephone Encounter (Signed)
Spoke with patient regarding appointment for MRI breast.  Confirmed appointment for 08/19/18 at 4:30pm.

## 2018-08-17 ENCOUNTER — Ambulatory Visit: Payer: BC Managed Care – PPO

## 2018-08-17 ENCOUNTER — Encounter: Payer: Self-pay | Admitting: Adult Health

## 2018-08-18 ENCOUNTER — Inpatient Hospital Stay: Payer: BC Managed Care – PPO

## 2018-08-18 ENCOUNTER — Other Ambulatory Visit: Payer: Self-pay

## 2018-08-18 VITALS — BP 118/72 | HR 102 | Temp 98.3°F | Resp 18

## 2018-08-18 DIAGNOSIS — C50412 Malignant neoplasm of upper-outer quadrant of left female breast: Secondary | ICD-10-CM

## 2018-08-18 DIAGNOSIS — Z171 Estrogen receptor negative status [ER-]: Secondary | ICD-10-CM

## 2018-08-18 DIAGNOSIS — Z95828 Presence of other vascular implants and grafts: Secondary | ICD-10-CM

## 2018-08-18 MED ORDER — TBO-FILGRASTIM 480 MCG/0.8ML ~~LOC~~ SOSY
480.0000 ug | PREFILLED_SYRINGE | Freq: Once | SUBCUTANEOUS | Status: AC
  Administered 2018-08-18: 480 ug via SUBCUTANEOUS

## 2018-08-18 MED ORDER — TBO-FILGRASTIM 480 MCG/0.8ML ~~LOC~~ SOSY
PREFILLED_SYRINGE | SUBCUTANEOUS | Status: AC
  Filled 2018-08-18: qty 0.8

## 2018-08-19 ENCOUNTER — Ambulatory Visit (HOSPITAL_COMMUNITY)
Admission: RE | Admit: 2018-08-19 | Discharge: 2018-08-19 | Disposition: A | Discharge status: Home / Self Care | Payer: BC Managed Care – PPO | Source: Ambulatory Visit | Attending: Adult Health | Admitting: Adult Health

## 2018-08-19 DIAGNOSIS — Z171 Estrogen receptor negative status [ER-]: Secondary | ICD-10-CM

## 2018-08-19 DIAGNOSIS — C50412 Malignant neoplasm of upper-outer quadrant of left female breast: Secondary | ICD-10-CM

## 2018-08-19 MED ORDER — GADOBUTROL 1 MMOL/ML IV SOLN
7.0000 mL | Freq: Once | INTRAVENOUS | Status: AC | PRN
  Administered 2018-08-19: 7 mL via INTRAVENOUS

## 2018-08-23 ENCOUNTER — Inpatient Hospital Stay (HOSPITAL_BASED_OUTPATIENT_CLINIC_OR_DEPARTMENT_OTHER): Payer: BC Managed Care – PPO | Admitting: Adult Health

## 2018-08-23 ENCOUNTER — Telehealth: Payer: Self-pay | Admitting: *Deleted

## 2018-08-23 ENCOUNTER — Other Ambulatory Visit: Payer: Self-pay

## 2018-08-23 ENCOUNTER — Inpatient Hospital Stay: Payer: BC Managed Care – PPO

## 2018-08-23 ENCOUNTER — Encounter: Payer: Self-pay | Admitting: Adult Health

## 2018-08-23 VITALS — BP 111/74 | HR 111 | Temp 98.7°F | Resp 18 | Ht 64.0 in | Wt 151.2 lb

## 2018-08-23 DIAGNOSIS — Z171 Estrogen receptor negative status [ER-]: Secondary | ICD-10-CM | POA: Diagnosis not present

## 2018-08-23 DIAGNOSIS — K5900 Constipation, unspecified: Secondary | ICD-10-CM

## 2018-08-23 DIAGNOSIS — C50412 Malignant neoplasm of upper-outer quadrant of left female breast: Secondary | ICD-10-CM

## 2018-08-23 DIAGNOSIS — R110 Nausea: Secondary | ICD-10-CM

## 2018-08-23 DIAGNOSIS — R590 Localized enlarged lymph nodes: Secondary | ICD-10-CM

## 2018-08-23 DIAGNOSIS — Z7689 Persons encountering health services in other specified circumstances: Secondary | ICD-10-CM

## 2018-08-23 DIAGNOSIS — Z5111 Encounter for antineoplastic chemotherapy: Secondary | ICD-10-CM

## 2018-08-23 DIAGNOSIS — Z801 Family history of malignant neoplasm of trachea, bronchus and lung: Secondary | ICD-10-CM

## 2018-08-23 DIAGNOSIS — R5383 Other fatigue: Secondary | ICD-10-CM

## 2018-08-23 DIAGNOSIS — Z95828 Presence of other vascular implants and grafts: Secondary | ICD-10-CM

## 2018-08-23 DIAGNOSIS — Z85820 Personal history of malignant melanoma of skin: Secondary | ICD-10-CM

## 2018-08-23 DIAGNOSIS — G629 Polyneuropathy, unspecified: Secondary | ICD-10-CM

## 2018-08-23 DIAGNOSIS — R000 Tachycardia, unspecified: Secondary | ICD-10-CM

## 2018-08-23 DIAGNOSIS — Z79899 Other long term (current) drug therapy: Secondary | ICD-10-CM

## 2018-08-23 DIAGNOSIS — Z8051 Family history of malignant neoplasm of kidney: Secondary | ICD-10-CM

## 2018-08-23 DIAGNOSIS — Z923 Personal history of irradiation: Secondary | ICD-10-CM

## 2018-08-23 DIAGNOSIS — D709 Neutropenia, unspecified: Secondary | ICD-10-CM

## 2018-08-23 LAB — CBC WITH DIFFERENTIAL/PLATELET
Abs Immature Granulocytes: 0 10*3/uL (ref 0.00–0.07)
Basophils Absolute: 0 10*3/uL (ref 0.0–0.1)
Basophils Relative: 1 %
Eosinophils Absolute: 0 10*3/uL (ref 0.0–0.5)
Eosinophils Relative: 0 %
HCT: 25.4 % — ABNORMAL LOW (ref 36.0–46.0)
Hemoglobin: 8.2 g/dL — ABNORMAL LOW (ref 12.0–15.0)
Immature Granulocytes: 0 %
Lymphocytes Relative: 47 %
Lymphs Abs: 0.9 10*3/uL (ref 0.7–4.0)
MCH: 34.2 pg — ABNORMAL HIGH (ref 26.0–34.0)
MCHC: 32.3 g/dL (ref 30.0–36.0)
MCV: 105.8 fL — ABNORMAL HIGH (ref 80.0–100.0)
Monocytes Absolute: 0.2 10*3/uL (ref 0.1–1.0)
Monocytes Relative: 10 %
Neutro Abs: 0.8 10*3/uL — ABNORMAL LOW (ref 1.7–7.7)
Neutrophils Relative %: 42 %
Platelets: 43 10*3/uL — ABNORMAL LOW (ref 150–400)
RBC: 2.4 MIL/uL — ABNORMAL LOW (ref 3.87–5.11)
RDW: 17.1 % — ABNORMAL HIGH (ref 11.5–15.5)
WBC: 2 10*3/uL — ABNORMAL LOW (ref 4.0–10.5)
nRBC: 0 % (ref 0.0–0.2)

## 2018-08-23 LAB — COMPREHENSIVE METABOLIC PANEL
ALT: 59 U/L — ABNORMAL HIGH (ref 0–44)
AST: 42 U/L — ABNORMAL HIGH (ref 15–41)
Albumin: 3.3 g/dL — ABNORMAL LOW (ref 3.5–5.0)
Alkaline Phosphatase: 99 U/L (ref 38–126)
Anion gap: 7 (ref 5–15)
BUN: 7 mg/dL (ref 6–20)
CO2: 23 mmol/L (ref 22–32)
Calcium: 8.4 mg/dL — ABNORMAL LOW (ref 8.9–10.3)
Chloride: 108 mmol/L (ref 98–111)
Creatinine, Ser: 0.62 mg/dL (ref 0.44–1.00)
GFR calc Af Amer: >60 mL/min (ref >60)
GFR calc non Af Amer: >60 mL/min (ref >60)
Glucose, Bld: 108 mg/dL — ABNORMAL HIGH (ref 70–99)
Potassium: 4 mmol/L (ref 3.5–5.1)
Sodium: 138 mmol/L (ref 135–145)
Total Bilirubin: 0.3 mg/dL (ref 0.3–1.2)
Total Protein: 6.3 g/dL — ABNORMAL LOW (ref 6.5–8.1)

## 2018-08-23 MED ORDER — SODIUM CHLORIDE 0.9% FLUSH
10.0000 mL | Freq: Once | INTRAVENOUS | Status: AC
  Administered 2018-08-23: 10 mL
  Filled 2018-08-23: qty 10

## 2018-08-23 NOTE — Telephone Encounter (Signed)
Spoke with patient to follow up.  She states she is doing well.  She is not going to receive any more chemo due to low platelet count.  She had a good response to chemo according to her MRI.  I have sent a message to Dr. Josetta Huddle office for an appointment to follow up to discuss surgery.  Encouraged her to call with any needs or concerns.

## 2018-08-23 NOTE — Progress Notes (Addendum)
[place]  [phone] [phone]    ID: [name] DOB: 09-28-1960  MR#: [id number]  UUV#:[id number]  Patient Care Team: Kandace Blitz, MD as PCP - General (Obstetrics and Gynecology) Rockwell Germany, RN as Oncology Nurse Navigator Mauro Kaufmann, RN as Oncology Nurse Navigator Erroll Luna, MD as Consulting Physician (General Surgery) Magrinat, Virgie Dad, MD as Consulting Physician (Oncology) Kyung Rudd, MD as Consulting Physician (Radiation Oncology) OTHER MD: Rana Snare (OBGYN)   CHIEF COMPLAINT: Triple negative breast cancer  CURRENT TREATMENT: Neoadjuvant chemotherapy   INTERVAL HISTORY: Chrysa returns today for follow-up and treatment of her triple negative breast cancer. She is receiving neoadjuvant chemotherapy.  She has completed four cycles of Doxorubicin and Cyclophosphamide, and four cycles of weekly Paclitaxel and Carboplatin.  Essynce is here today for evaluation and discussion of her labs and breast MRI prior to receiving treatment with weekly Paclitaxel. We held her Paclitaxel and Carboplatin last week due to thrombocytopenia and neutropenia (despite two doses of granix).    She underwent breast MRI that noted significant improvement in left breast and lymph nodes.    REVIEW OF SYSTEMS: Adaisha notes new onset of mild intermittent peripheral neuropathy in her fingertips.  She notes that they are numb from time to time, but denies any pain, burning, or tingling.  She denies any issues in her toes.  She is without any fever, chills, chest pain, palpitations.  She has some mild discomfort in her rib cage from the breast MRI.  She denies any bowel/bladder issues.  She is without nausea or vomiting.  Since we held her chemotherapy last week she notes her fatigue is improved.  Otherwise a detailed ROS was non contributory.   HISTORY OF CURRENT ILLNESS: From the original intake note:  Kathryn [name] presented with a palpable  subareolar left breast lump, nipple retraction, and discharge for approximately 1 week. She underwent bilateral diagnostic mammography with tomography and left breast ultrasonography at [facility] on 04/01/2018 showing: Breast Density Category C. An irregular hyperdense mass is seen in the left subareolar region. An additional oval, circumscribed mass is seen in the inferior central aspect at posterior depth. No additional suspicious findings are identified within the remainder of the left breast. On physical exam, there is a 2-3 cm firm, fixed lump in the subareolar region on the left. Subtle skin changes and retraction is noted along the left nipple. Targeted ultrasound is performed, showing an irregular hypoechoic mass with associated vascularity at the 12 o'clock retroareolar position on the left. Overall measurements are 2.6 x 2.3 x 1.2 cm. This corresponds with the mammographic finding. Two adjacent circumscribed hypoechoic masses are identified in the deep 6 o'clock position 2 cm from the nipple. They measure 0.8 x 0.6 x 0.4 cm and 1.3 x 1.1 x 0.7 cm. There is no seated vascularity. This corresponds with the additional mammographic finding and likely represents minimally complicated cyst. Evaluation of the left axilla demonstrates a markedly enlarged abnormal lymph node with complete hilar replacement. It measures up to 5 cm in long axis dimension. No suspicious mammographic findings on the right.   Accordingly on 04/06/2018 she proceeded to biopsy of the left breast area in question. The pathology from this procedure showed ([id number]): invasive ductal carcinoma, grade III, with lymphovascular invasion. Prognostic indicators significant for: estrogen receptor, 0% negative and progesterone receptor, 0% negative. Proliferation marker Ki67 at 70%. HER2 equivocal (2+) by immunohistochemistry, but negative by FISH (print3ed report pending).  On the  same day she underwent a biopsy of the left axillary  lymph node on 04/06/2018 showing ([id number]): metastatic carcinoma in 1 of 1 lymph node (1/1).   The patient's subsequent history is as detailed below.   PAST MEDICAL HISTORY: Past Medical History:  Diagnosis Date   Breast cancer ([place])    stage 3 - left   Family history of kidney cancer    Family history of melanoma      PAST SURGICAL HISTORY: Past Surgical History:  Procedure Laterality Date   CESAREAN SECTION     x3   PORTACATH PLACEMENT N/A 04/28/2018   Procedure: INSERTION PORT-A-CATH WITH ULTRASOUND;  Surgeon: Erroll Luna, MD;  Location: [facility] OR;  Service: General;  Laterality: N/A;     FAMILY HISTORY: Family History  Problem Relation Age of Onset   Kidney cancer Sister 50       d. 46   Lung cancer Paternal Uncle    Melanoma Sister    Melanoma Sister    Darolyn's father died from congestive heart failure at age 103. Patients' mother is 52 as of 03/2018. The patient has 1 brother and 3 sisters. Patient denies anyone in her family having breast, ovarian, prostate, or pancreatic cancer. Kathryn's sister, Dyann [name], was diagnosed with Kidney Cancer at 49. Kathryn [name] has an uncle and a cousin that were diagnosed with lung cancer, but they were both heavy smokers.    GYNECOLOGIC HISTORY:  No LMP recorded. Patient is postmenopausal. 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 Her husband, Hanifah Royse, owns [employer]. Together, they have three children, [name], Brevig Mission, and Arnold. 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 Kathryn [name] is 48, lives in [place], and is a Equities trader at [employer]. 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    ADVANCED DIRECTIVES: Abraham's husband, Lynze Reddy, is automatically her healthcare power of attorney.    HEALTH MAINTENANCE: Social History   Tobacco Use   Smoking status: Never Smoker   Smokeless tobacco: Never Used  Substance Use Topics   Alcohol use: Yes    Comment: socially   Drug use: Never    Colonoscopy: no  PAP: 2015  Bone density: no   No Known Allergies  Current Outpatient Medications  Medication Sig Dispense Refill   dexamethasone (DECADRON) 4 MG tablet Take 4 mg by mouth 2 (two) times daily with a meal. Taking BID the first two days after chemotherapy     Docusate Sodium (COLACE PO) Take by mouth as needed.     ibuprofen (ADVIL,MOTRIN) 800 MG tablet Take 1 tablet (800 mg total) by mouth every 8 (eight) hours as needed. 30 tablet 0   lidocaine-prilocaine (EMLA) cream Apply to affected area once 30 g 3   loratadine (CLARITIN) 10 MG tablet Take 1 tablet (10 mg total) by mouth daily.     LORazepam (ATIVAN) 0.5 MG tablet Take 1 tablet (0.5 mg total) by mouth at bedtime as needed (Nausea or vomiting). 30 tablet 0   magic mouthwash w/lidocaine SOLN swish, gargle & spit 5-10 MILLILITER EVERY 6 HOURS AS NEEDED     omeprazole (PRILOSEC) 20 MG capsule Take 1 capsule (20 mg  total) by mouth daily as needed. 30 capsule 3   ondansetron (ZOFRAN) 8 MG tablet Take 0.5-1 tablets (4-8 mg total) by mouth 3 (three) times daily as needed for nausea or vomiting. 20 tablet 0   No current facility-administered medications for this visit.      OBJECTIVE:  Vitals:   08/23/18 0836  BP: 111/74  Pulse: (!) 111  Resp: 18  Temp: 98.7 F (37.1 C)  SpO2: 100%     Body mass index is 25.95 kg/m.   Wt Readings from Last 3 Encounters:  08/23/18 151 lb 3.2 oz (68.6 kg)  08/16/18 148 lb 12.8 oz (67.5 kg)  08/09/18 149 lb (67.6 kg)  ECOG FS:1 GENERAL: Patient is a well appearing female in no acute distress HEENT:  Sclerae anicteric.  Oropharynx clear and moist. No  ulcerations or evidence of oropharyngeal candidiasis. Neck is supple.  NODES:  No cervical, supraclavicular, or axillary lymphadenopathy palpated.  BREAST EXAM: deferred today LUNGS:  Clear to auscultation bilaterally.  No wheezes or rhonchi. HEART:  Regular rate and rhythm. No murmur appreciated. ABDOMEN:  Soft, nontender.  Positive, normoactive bowel sounds. No organomegaly palpated. MSK:  No focal spinal tenderness to palpation. Full range of motion bilaterally in the upper extremities. EXTREMITIES:  No peripheral edema.   SKIN:  Clear with no obvious rashes or skin changes. No nail dyscrasia. NEURO:  Nonfocal. Well oriented.  Appropriate affect.     LAB RESULTS:  CMP     Component Value Date/Time   NA 138 08/23/2018 0820   K 4.0 08/23/2018 0820   CL 108 08/23/2018 0820   CO2 23 08/23/2018 0820   GLUCOSE 108 (H) 08/23/2018 0820   BUN 7 08/23/2018 0820   CREATININE 0.62 08/23/2018 0820   CREATININE 0.62 08/02/2018 0834   CALCIUM 8.4 (L) 08/23/2018 0820   PROT 6.3 (L) 08/23/2018 0820   ALBUMIN 3.3 (L) 08/23/2018 0820   AST 42 (H) 08/23/2018 0820   AST 29 08/02/2018 0834   ALT 59 (H) 08/23/2018 0820   ALT 41 08/02/2018 0834   ALKPHOS 99 08/23/2018 0820   BILITOT 0.3 08/23/2018 0820   BILITOT 0.3 08/02/2018 0834   GFRNONAA >60 08/23/2018 0820   GFRNONAA >60 08/02/2018 0834   GFRAA >60 08/23/2018 0820   GFRAA >60 08/02/2018 0834    No results found for: TOTALPROTELP, ALBUMINELP, A1GS, A2GS, BETS, BETA2SER, GAMS, MSPIKE, SPEI  No results found for: KPAFRELGTCHN, LAMBDASER, KAPLAMBRATIO  Lab Results  Component Value Date   WBC 2.0 (L) 08/23/2018   NEUTROABS 0.8 (L) 08/23/2018   HGB 8.2 (L) 08/23/2018   HCT 25.4 (L) 08/23/2018   MCV 105.8 (H) 08/23/2018   PLT 43 (L) 08/23/2018    '@LASTCHEMISTRY' @  No results found for: LABCA2  No components found for: JQGBEE100  No results for input(s): INR in the last 168 hours.  No results found for: LABCA2  No results  found for: FHQ197  No results found for: JOI325  No results found for: QDI264  No results found for: CA2729  No components found for: HGQUANT  No results found for: CEA1 / No results found for: CEA1   No results found for: AFPTUMOR  No results found for: CHROMOGRNA  No results found for: PSA1  Appointment on 08/23/2018  Component Date Value Ref Range Status   Sodium 08/23/2018 138  135 - 145 mmol/L Final   Potassium 08/23/2018 4.0  3.5 - 5.1 mmol/L Final   Chloride 08/23/2018 108  98 - 111  mmol/L Final   CO2 08/23/2018 23  22 - 32 mmol/L Final   Glucose, Bld 08/23/2018 108* 70 - 99 mg/dL Final   BUN 08/23/2018 7  6 - 20 mg/dL Final   Creatinine, Ser 08/23/2018 0.62  0.44 - 1.00 mg/dL Final   Calcium 08/23/2018 8.4* 8.9 - 10.3 mg/dL Final   Total Protein 08/23/2018 6.3* 6.5 - 8.1 g/dL Final   Albumin 08/23/2018 3.3* 3.5 - 5.0 g/dL Final   AST 08/23/2018 42* 15 - 41 U/L Final   ALT 08/23/2018 59* 0 - 44 U/L Final   Alkaline Phosphatase 08/23/2018 99  38 - 126 U/L Final   Total Bilirubin 08/23/2018 0.3  0.3 - 1.2 mg/dL Final   GFR calc non Af Amer 08/23/2018 >60  >60 mL/min Final   GFR calc Af Amer 08/23/2018 >60  >60 mL/min Final   Anion gap 08/23/2018 7  5 - 15 Final   Performed at [facility], [place] [address] [place], [place] [address]   WBC 08/23/2018 2.0* 4.0 - 10.5 K/uL Final   RBC 08/23/2018 2.40* 3.87 - 5.11 MIL/uL Final   Hemoglobin 08/23/2018 8.2* 12.0 - 15.0 g/dL Final   HCT 08/23/2018 25.4* 36.0 - 46.0 % Final   MCV 08/23/2018 105.8* 80.0 - 100.0 fL Final   MCH 08/23/2018 34.2* 26.0 - 34.0 pg Final   MCHC 08/23/2018 32.3  30.0 - 36.0 g/dL Final   RDW 08/23/2018 17.1* 11.5 - 15.5 % Final   Platelets 08/23/2018 43* 150 - 400 K/uL Final   Comment: Immature Platelet Fraction may be clinically indicated, consider ordering this additional test [id number]    nRBC 08/23/2018 0.0  0.0 - 0.2 % Final   Neutrophils  Relative % 08/23/2018 42  % Final   Neutro Abs 08/23/2018 0.8* 1.7 - 7.7 K/uL Final   Lymphocytes Relative 08/23/2018 47  % Final   Lymphs Abs 08/23/2018 0.9  0.7 - 4.0 K/uL Final   Monocytes Relative 08/23/2018 10  % Final   Monocytes Absolute 08/23/2018 0.2  0.1 - 1.0 K/uL Final   Eosinophils Relative 08/23/2018 0  % Final   Eosinophils Absolute 08/23/2018 0.0  0.0 - 0.5 K/uL Final   Basophils Relative 08/23/2018 1  % Final   Basophils Absolute 08/23/2018 0.0  0.0 - 0.1 K/uL Final   Immature Granulocytes 08/23/2018 0  % Final   Abs Immature Granulocytes 08/23/2018 0.00  0.00 - 0.07 K/uL Final   Polychromasia 08/23/2018 PRESENT   Final   Ovalocytes 08/23/2018 PRESENT   Final   Performed at [facility], [place] [address], [place], [place] [address]    (this displays the last labs from the last 3 days)  No results found for: TOTALPROTELP, ALBUMINELP, A1GS, A2GS, BETS, BETA2SER, GAMS, MSPIKE, SPEI (this displays SPEP labs)  No results found for: KPAFRELGTCHN, LAMBDASER, KAPLAMBRATIO (kappa/lambda light chains)  No results found for: HGBA, HGBA2QUANT, HGBFQUANT, HGBSQUAN (Hemoglobinopathy evaluation)   No results found for: LDH  No results found for: IRON, TIBC, IRONPCTSAT (Iron and TIBC)  No results found for: FERRITIN  Urinalysis No results found for: COLORURINE, APPEARANCEUR, LABSPEC, PHURINE, GLUCOSEU, HGBUR, BILIRUBINUR, KETONESUR, PROTEINUR, UROBILINOGEN, NITRITE, LEUKOCYTESUR   STUDIES:  Mr Breast Bilateral W Wo Contrast Inc Cad  Result Date: 08/22/2018 CLINICAL DATA:  58 year old patient with history of triple negative left breast cancer and left axillary nodal metastasis diagnosed in February 2020. Presents for breast MRI to evaluate response to neoadjuvant chemotherapy. The MRI technologist reports  that some of the images were repeated and images obtained are to the best of the patient's ability. LABS:  None obtained EXAM: BILATERAL  BREAST MRI WITH AND WITHOUT CONTRAST TECHNIQUE: Multiplanar, multisequence MR images of both breasts were obtained prior to and following the intravenous administration of 7 ml of Gadavist Three-dimensional MR images were rendered by post-processing of the original MR data on an independent workstation. The three-dimensional MR images were interpreted, and findings are reported in the following complete MRI report for this study. Three dimensional images were evaluated at the independent DynaCad workstation COMPARISON:  Bilateral breast MRI April 19, 2018. Bilateral diagnostic mammogram and left breast ultrasound April 01, 2018. FINDINGS: Breast composition: c. Heterogeneous fibroglandular tissue. Background parenchymal enhancement: Mild Right breast: No mass or abnormal enhancement. Left breast: In the immediate retroareolar left breast, central and just lateral to the nipple are residual linearly oriented areas of enhancement, that extend to the nipple. This area of enhancement measures approximately 1.3 x 0.9 cm axial dimension. Enhancement of the left nipple and left nipple retraction have decreased compared to the MRI of February 2020. In approximately 6 o'clock retroareolar left breast, 3 cm deep to the nipple, in the middle third of the breast parenchyma, is an enhancing irregular mass measuring 0.7 x 0.5 x 0.7 cm. The anterior to posterior extent of the central nipple/immediate retroareolar enhancement, and the enhancing mass in the middle third of the breast parenchyma is approximately 3.1 cm AP diameter, inclusive. The remainder of the left breast is negative. Other than slight skin thickening of the areola, there is no evidence of breast skin thickening elsewhere. Pectoralis muscle is negative. Lymph nodes: A level II lymph node in the upper left axilla is elongate, measuring 1.5 cm in length and 0.4 cm in short axis. Based on its position, and given that it is the largest visible lymph node, this  likely reflects the previously biopsied lymph node involved with metastasis, that previously measured up to 5.0 x 2.6 cm in February 2020. Biopsy clip artifact is not well visualized on MRI images in the left axilla today. No suspicious left axillary lymph nodes at this time. The previously described left internal mammary chain lymph nodes measuring approximately 5-6 mm on the MRI of 04/19/2018 have significantly decreased in size. Currently, no suspicious internal mammary chain lymph nodes are identified. On image 140/248 on the axial T1 precontrast images, an approximately 1 mm internal mammary chain lymph node is visualized. On image 102/248 of the T1 precontrast images an approximately 1 mm internal mammary chain lymph node is visualized. Right axilla is negative. Ancillary findings: Port-A-Cath is noted in the superior right breast medially. IMPRESSION: Significant positive response to neoadjuvant chemotherapy compared to breast MRI of April 19, 2018. Biopsy-proven malignancy in the left breast has significantly decreased, with small residual areas of enhancement as described above. Similarly, bulky left axillary lymphadenopathy has resolved. Left internal mammary chain lymph nodes are now within normal limits for size. No MRI evidence of malignancy in the right breast. RECOMMENDATION: Continue treatment planning. BI-RADS CATEGORY  6: Known biopsy-proven malignancy. Electronically Signed   By: Curlene Dolphin M.D.   On: 08/22/2018 12:20    ELIGIBLE FOR AVAILABLE RESEARCH PROTOCOL: [id number]   ASSESSMENT: 58 y.o. [place] woman status post left breast upper outer quadrant biopsy 04/06/2018 for a clinical T2 N2, stage IIIC invasive ductal carcinoma, grade 3, triple negative, with an MIB-1 of 70%  (a) breast MRI 04/19/2018 shows a 5 cm central breast  lesion with multiple satellite nodules and greater then 3 abnormal axillary lymph nodes  (b) baseline echocardiogram 04/25/2018 shows an ejection fraction in  the 60-65% range  (c) chest CT scan and bone scan showed no metastatic disease.  Nonspecific rib changes will need to be followed  (1) neoadjuvant chemotherapy consisting of doxorubicin and cyclophosphamide given every 21 days x4, starting 05/03/2018, completed 06/28/2018, to be followed by weekly paclitaxel and carboplatin x12 starting 07/20/2018   (a) Doxorubicin and Cyclophosphamide dose reduced for cycles 3 and 4 due to neutropenia  (b) dose dense changed to every 3 weeks for doxorubicin/cyclophosphamide secondary to cytopenias and side effects  (2) definitive surgery to follow  (3) adjuvant radiation after surgery  (4) genetics testing 04/27/2018: No pathogenic mutations.The Multi-Gene Panel offered by [facility] includes sequencing and/or deletion duplication testing of the following 85 genes: AIP, ALK, APC, ATM, AXIN2,BAP1,  BARD1, BLM, BMPR1A, BRCA1, BRCA2, BRIP1, CASR, CDC73, CDH1, CDK4, CDKN1B, CDKN1C, CDKN2A (p14ARF), CDKN2A (p16INK4a), CEBPA, CHEK2, CTNNA1, DICER1, DIS3L2, EGFR (c.2369C>T, p.Thr790Met variant only), EPCAM (Deletion/duplication testing only), FH, FLCN, GATA2, GPC3, GREM1 (Promoter region deletion/duplication testing only), HOXB13 (c.251G>A, p.Gly84Glu), HRAS, KIT, MAX, MEN1, MET, MITF (c.952G>A, p.Glu318Lys variant only), MLH1, MSH2, MSH3, MSH6, MUTYH, NBN, NF1, NF2, NTHL1, PALB2, PDGFRA, PHOX2B, PMS2, POLD1, POLE, POT1, PRKAR1A, PTCH1, PTEN, RAD50, RAD51C, RAD51D, RB1, RECQL4, RET, RNF43, RUNX1, SDHAF2, SDHA (sequence changes only), SDHB, SDHC, SDHD, SMAD4, SMARCA4, SMARCB1, SMARCE1, STK11, SUFU, TERC, TERT, TMEM127, TP53, TSC1, TSC2, VHL, WRN and WT1.   PLAN: Carmin is doing well today.  I reviewed her labs with her today.  Her WBC is 2, and her plt count is 43.  She also is starting to develop mild intermittent peripheral neuropathy.  She met with Dr. Jana Hakim and myself and we reviewed her MRI, which shows some small residual enhancement, but overall a very good response to  her treatment.  Due to her adverse effects, she will not receive any further treatment.  At this point, we will send her to surgery and await her pathology results.  She is interested in [place] trial if eligible, so she will keep her port during surgery.  She will see Dr. Jana Hakim in 4-5 weeks to review her pathology results.  I sent a message detailing our plan to Dr. Brantley Stage, and our breast navigators [place] and [place].    Janaiyah will return on 7/14 for lab only.  She knows to call for any other issue that may develop before the next visit.    Wilber Bihari, NP  08/23/18 4:23 PM Medical Oncology and Hematology [facility] [address] [place], [place] [address] Tel. [phone]    Fax. [phone]    ADDENDUM: Kiandria was very motivated to continue treatment but it is much safer for her to stop at this point.  She has had a very good response and I think this will be reflected in her final surgical results.  I think she may be a candidate for S14 18 and if so she would be very interested in participating.  There is a significant residual amount of disease we will also consider adjuvant capecitabine  I personally saw this patient and performed a substantive portion of this encounter with the listed APP documented above.   Chauncey Cruel, MD Medical Oncology and Hematology [facility] [address] [place], [place] [address] Tel. [phone]    Fax. [phone]

## 2018-08-24 ENCOUNTER — Telehealth: Payer: Self-pay | Admitting: Oncology

## 2018-08-24 HISTORY — PX: BREAST LUMPECTOMY: SHX2

## 2018-08-24 NOTE — Telephone Encounter (Signed)
I talk with patient regarding schedule  

## 2018-08-24 NOTE — Telephone Encounter (Signed)
Thank you so much for letting me know.

## 2018-08-30 ENCOUNTER — Inpatient Hospital Stay: Payer: BC Managed Care – PPO

## 2018-09-06 ENCOUNTER — Ambulatory Visit: Payer: BC Managed Care – PPO | Admitting: Oncology

## 2018-09-06 ENCOUNTER — Other Ambulatory Visit: Payer: BLUE CROSS/BLUE SHIELD

## 2018-09-06 ENCOUNTER — Other Ambulatory Visit: Payer: Self-pay

## 2018-09-06 ENCOUNTER — Ambulatory Visit: Payer: BLUE CROSS/BLUE SHIELD

## 2018-09-06 ENCOUNTER — Inpatient Hospital Stay: Payer: BC Managed Care – PPO | Attending: Oncology

## 2018-09-06 ENCOUNTER — Ambulatory Visit: Payer: Self-pay | Admitting: Surgery

## 2018-09-06 DIAGNOSIS — C50412 Malignant neoplasm of upper-outer quadrant of left female breast: Secondary | ICD-10-CM | POA: Insufficient documentation

## 2018-09-06 DIAGNOSIS — Z171 Estrogen receptor negative status [ER-]: Secondary | ICD-10-CM | POA: Diagnosis not present

## 2018-09-06 DIAGNOSIS — C50912 Malignant neoplasm of unspecified site of left female breast: Secondary | ICD-10-CM

## 2018-09-06 LAB — CBC WITH DIFFERENTIAL/PLATELET
Abs Immature Granulocytes: 0.01 10*3/uL (ref 0.00–0.07)
Basophils Absolute: 0 10*3/uL (ref 0.0–0.1)
Basophils Relative: 1 %
Eosinophils Absolute: 0 10*3/uL (ref 0.0–0.5)
Eosinophils Relative: 1 %
HCT: 34.2 % — ABNORMAL LOW (ref 36.0–46.0)
Hemoglobin: 10.7 g/dL — ABNORMAL LOW (ref 12.0–15.0)
Immature Granulocytes: 0 %
Lymphocytes Relative: 40 %
Lymphs Abs: 1.4 10*3/uL (ref 0.7–4.0)
MCH: 34.2 pg — ABNORMAL HIGH (ref 26.0–34.0)
MCHC: 31.3 g/dL (ref 30.0–36.0)
MCV: 109.3 fL — ABNORMAL HIGH (ref 80.0–100.0)
Monocytes Absolute: 0.5 10*3/uL (ref 0.1–1.0)
Monocytes Relative: 15 %
Neutro Abs: 1.6 10*3/uL — ABNORMAL LOW (ref 1.7–7.7)
Neutrophils Relative %: 43 %
Platelets: 304 10*3/uL (ref 150–400)
RBC: 3.13 MIL/uL — ABNORMAL LOW (ref 3.87–5.11)
RDW: 16.2 % — ABNORMAL HIGH (ref 11.5–15.5)
WBC: 3.6 10*3/uL — ABNORMAL LOW (ref 4.0–10.5)
nRBC: 0 % (ref 0.0–0.2)

## 2018-09-06 LAB — COMPREHENSIVE METABOLIC PANEL
ALT: 53 U/L — ABNORMAL HIGH (ref 0–44)
AST: 38 U/L (ref 15–41)
Albumin: 3.9 g/dL (ref 3.5–5.0)
Alkaline Phosphatase: 119 U/L (ref 38–126)
Anion gap: 10 (ref 5–15)
BUN: 9 mg/dL (ref 6–20)
CO2: 24 mmol/L (ref 22–32)
Calcium: 9.4 mg/dL (ref 8.9–10.3)
Chloride: 108 mmol/L (ref 98–111)
Creatinine, Ser: 0.72 mg/dL (ref 0.44–1.00)
GFR calc Af Amer: >60 mL/min (ref >60)
GFR calc non Af Amer: >60 mL/min (ref >60)
Glucose, Bld: 103 mg/dL — ABNORMAL HIGH (ref 70–99)
Potassium: 4.2 mmol/L (ref 3.5–5.1)
Sodium: 142 mmol/L (ref 135–145)
Total Bilirubin: 0.3 mg/dL (ref 0.3–1.2)
Total Protein: 7.3 g/dL (ref 6.5–8.1)

## 2018-09-06 NOTE — H&P (View-Only) (Signed)
Kathryn [name] Documented: 09/06/2018 4:24 PM Location: [facility] Patient #: [id number] DOB: 02/17/1961 Undefined / Language: Undefined / Race: White Female  History of Present Illness (Cael Worth A. Lakedra Washington MD; 09/06/2018 5:11 PM) Patient words: Patient returns for follow-up of her stage II left breast cancer. She completed neoadjuvant chemotherapy and her magnetic resonance imaging shows excellent response with significant reduction in size. She will keep her port though for potential trial. Overall, she stopped chemotherapy early due to toxicity but had a great response and would like to proceed with breast conserving surgery. She had a positive node that will be targeted and small subcentimeter intramammary nodes and one on either subcentimeter left extra node was hot biopsied. All this resolved significantly with chemotherapy.             58-year-old patient with history of triple negative left breast cancer and left axillary nodal metastasis diagnosed in February 2020. Presents for breast MRI to evaluate response to neoadjuvant chemotherapy. The MRI technologist reports that some of the images were repeated and images obtained are to the best of the patient's ability.  LABS: None obtained  EXAM: BILATERAL BREAST MRI WITH AND WITHOUT CONTRAST  TECHNIQUE: Multiplanar, multisequence MR images of both breasts were obtained prior to and following the intravenous administration of 7 ml of Gadavist  Three-dimensional MR images were rendered by post-processing of the original MR data on an independent workstation. The three-dimensional MR images were interpreted, and findings are reported in the following complete MRI report for this study. Three dimensional images were evaluated at the independent DynaCad workstation  COMPARISON: Bilateral breast MRI April 19, 2018. Bilateral diagnostic mammogram and left breast ultrasound April 01, 2018.  FINDINGS: Breast composition: c. Heterogeneous fibroglandular tissue.  Background parenchymal enhancement: Mild  Right breast: No mass or abnormal enhancement.  Left breast: In the immediate retroareolar left breast, central and just lateral to the nipple are residual linearly oriented areas of enhancement, that extend to the nipple. This area of enhancement measures approximately 1.3 x 0.9 cm axial dimension. Enhancement of the left nipple and left nipple retraction have decreased compared to the MRI of February 2020.  In approximately 6 o'clock retroareolar left breast, 3 cm deep to the nipple, in the middle third of the breast parenchyma, is an enhancing irregular mass measuring 0.7 x 0.5 x 0.7 cm. The anterior to posterior extent of the central nipple/immediate retroareolar enhancement, and the enhancing mass in the middle third of the breast parenchyma is approximately 3.1 cm AP diameter, inclusive. The remainder of the left breast is negative.  Other than slight skin thickening of the areola, there is no evidence of breast skin thickening elsewhere. Pectoralis muscle is negative.  Lymph nodes: A level II lymph node in the upper left axilla is elongate, measuring 1.5 cm in length and 0.4 cm in short axis. Based on its position, and given that it is the largest visible lymph node, this likely reflects the previously biopsied lymph node involved with metastasis, that previously measured up to 5.0 x 2.6 cm in February 2020. Biopsy clip artifact is not well visualized on MRI images in the left axilla today. No suspicious left axillary lymph nodes at this time.  The previously described left internal mammary chain lymph nodes measuring approximately 5-6 mm on the MRI of 04/19/2018 have significantly decreased in size. Currently, no suspicious internal mammary chain lymph nodes are identified. On image 140/248 on the axial T1 precontrast images, an approximately 1 mm    internal mammary chain lymph node is visualized. On image 102/248 of the T1 precontrast images an approximately 1 mm internal mammary chain lymph node is visualized.  Right axilla is negative.  Ancillary findings: Port-A-Cath is noted in the superior right breast medially.  IMPRESSION: Significant positive response to neoadjuvant chemotherapy compared to breast MRI of April 19, 2018. Biopsy-proven malignancy in the left breast has significantly decreased, with small residual areas of enhancement as described above. Similarly, bulky left axillary lymphadenopathy has resolved. Left internal mammary chain lymph nodes are now within normal limits for size.  No MRI evidence of malignancy in the right breast.  RECOMMENDATION: Continue treatment planning.  BI-RADS CATEGORY 6: Known biopsy-proven malignancy.  ADDITIONAL INFORMATION: 1. PROGNOSTIC INDICATORS Results: IMMUNOHISTOCHEMICAL AND MORPHOMETRIC ANALYSIS PERFORMED MANUALLY The tumor cells are EQUIVOCAL for Her2 (2+). Her2 by FISH will be performed and the results reported separately. Estrogen Receptor: 0%, NEGATIVE Progesterone Receptor: 0%, NEGATIVE Proliferation Marker Ki67: 70% COMMENT: The negative hormone receptor study(ies) in this case has an internal positive control. REFERENCE RANGE ESTROGEN RECEPTOR NEGATIVE 0% POSITIVE =>1% REFERENCE RANGE PROGESTERONE RECEPTOR NEGATIVE 0% POSITIVE =>1% All controls stained appropriately JOSHUA KISH MD Pathologist, Electronic Signature ( Signed 04/08/2018) FINAL DIAGNOSIS Diagnosis 1. Breast, left, needle core biopsy, 12 o'clock, retroareolar mass - INVASIVE DUCTAL CARCINOMA. - LYMPHOVASCULAR INVASION IS IDENTIFIED. 1 of 3 FINAL for Higley, Charika ([id number]) Diagnosis(continued) - SEE COMMENT. 2. Lymph node, needle/core biopsy, left axillary - METASTATIC CARCINOMA IN 1 OF 1 LYMPH NODE (1/1). Microscopic Comment 1. The carcinoma appears grade III. A breast  prognostic profile will be performed and the results reported separately. The results were called to the [facility] on 04/07/2018. (JBK:kh 04/07/2018) JOSHUA KISH MD Pathologist, Electronic Signature (Case signed 04/07/2018) Specimen Gross and Clinical Information.  The patient is a 58 year old female.   Allergies (Sabrina Canty, CMA; 09/06/2018 4:25 PM) No Known Allergies [04/11/2018]: Allergies Reconciled  Medication History (Sabrina Canty, CMA; 09/06/2018 4:25 PM) Colace (100MG Capsule, Oral) Active. Medications Reconciled    Vitals (Sabrina Canty CMA; 09/06/2018 4:26 PM) 09/06/2018 4:26 PM Weight: 150.8 lb Height: 64in Body Surface Area: 1.73 m Body Mass Index: 25.88 kg/m  Temp.: 98.7F(Oral)  Pulse: 152 (Regular)  BP: 126/64 (Sitting, Left Arm, Standard)        Physical Exam (Ranjit Ashurst A. Chalee Hirota MD; 09/06/2018 5:12 PM)  General Mental Status-Alert. General Appearance-Consistent with stated age. Hydration-Well hydrated. Voice-Normal.  Breast Breast - Left-Symmetric, Non Tender, No Biopsy scars, no Dimpling - Left, No Inflammation, No Lumpectomy scars, No Mastectomy scars, No Peau d' Orange. Breast - Right-Symmetric, Non Tender, No Biopsy scars, no Dimpling - Right, No Inflammation, No Lumpectomy scars, No Mastectomy scars, No Peau d' Orange. Breast Lump-No Palpable Breast Mass.  Neurologic Neurologic evaluation reveals -alert and oriented x 3 with no impairment of recent or remote memory. Mental Status-Normal.  Musculoskeletal Normal Exam - Left-Upper Extremity Strength Normal and Lower Extremity Strength Normal. Normal Exam - Right-Upper Extremity Strength Normal and Lower Extremity Strength Normal.  Lymphatic Head & Neck  General Head & Neck Lymphatics: Bilateral - Description - Normal. Axillary  General Axillary Region: Bilateral - Description - Normal. Tenderness - Non Tender.    Assessment & Plan  (Doyce Saling A. Alazae Crymes MD; 09/06/2018 5:08 PM)  BREAST CANCER, LEFT (C50.912) Impression: Good response to neoadjuvant chemotherapy Patient is opted for left breast lumpectomy with target a left axillary seed lymph node biopsy and sentinel lymph node mapping. Report will stay in place for now   for potential trial. Risk of lumpectomy include bleeding, infection, seroma, more surgery, use of seed/wire, wound care, cosmetic deformity and the need for other treatments, death , blood clots, death. Pt agrees to proceed. Risk of sentinel lymph node mapping include bleeding, infection, lymphedema, shoulder pain. stiffness, dye allergy. cosmetic deformity , blood clots, death, need for more surgery. Pt agrees to proceed.  Current Plans You are being scheduled for surgery- Our schedulers will call you.  You should hear from our office's scheduling department within 5 working days about the location, date, and time of surgery. We try to make accommodations for patient's preferences in scheduling surgery, but sometimes the OR schedule or the surgeon's schedule prevents us from making those accommodations.  If you have not heard from our office ([phone]) in 5 working days, call the office and ask for your surgeon's nurse.  If you have other questions about your diagnosis, plan, or surgery, call the office and ask for your surgeon's nurse.  We discussed the staging and pathophysiology of breast cancer. We discussed all of the different options for treatment for breast cancer including surgery, chemotherapy, radiation therapy, Herceptin, and antiestrogen therapy. We discussed a sentinel lymph node biopsy as she does not appear to having lymph node involvement right now. We discussed the performance of that with injection of radioactive tracer and blue dye. We discussed that she would have an incision underneath her axillary hairline. We discussed that there is a bout a 10-20% chance of having a positive node  with a sentinel lymph node biopsy and we will await the permanent pathology to make any other first further decisions in terms of her treatment. One of these options might be to return to the operating room to perform an axillary lymph node dissection. We discussed about a 1-2% risk lifetime of chronic shoulder pain as well as lymphedema associated with a sentinel lymph node biopsy. We discussed the options for treatment of the breast cancer which included lumpectomy versus a mastectomy. We discussed the performance of the lumpectomy with a wire placement. We discussed a 10-20% chance of a positive margin requiring reexcision in the operating room. We also discussed that she may need radiation therapy or antiestrogen therapy or both if she undergoes lumpectomy. We discussed the mastectomy and the postoperative care for that as well. We discussed that there is no difference in her survival whether she undergoes lumpectomy with radiation therapy or antiestrogen therapy versus a mastectomy. There is a slight difference in the local recurrence rate being 3-5% with lumpectomy and about 1% with a mastectomy. We discussed the risks of operation including bleeding, infection, possible reoperation. She understands her further therapy will be based on what her stages at the time of her operation.  Pt Education - flb breast cancer surgery: discussed with patient and provided information. Pt Education - [facility] Breast Biopsy HCI: discussed with patient and provided information. Pt Education - ABC (After Breast Cancer) Class Info: discussed with patient and provided information. 

## 2018-09-06 NOTE — H&P (Signed)
[name] Documented: 09/06/2018 4:24 PM Location: [facility] Patient #: [id number] DOB: 1960-07-22 Undefined / Language: [name] / Race: White Female  History of Present Illness [name] A. Martel Galvan MD; 09/06/2018 5:11 PM) Patient words: Patient returns for follow-up of her stage II left breast cancer. She completed neoadjuvant chemotherapy and her magnetic resonance imaging shows excellent response with significant reduction in size. She will keep her port though for potential trial. Overall, she stopped chemotherapy early due to toxicity but had a great response and would like to proceed with breast conserving surgery. She had a positive node that will be targeted and small subcentimeter intramammary nodes and one on either subcentimeter left extra node was hot biopsied. All this resolved significantly with chemotherapy.             58 year old patient with history of triple negative left breast cancer and left axillary nodal metastasis diagnosed in February 2020. Presents for breast MRI to evaluate response to neoadjuvant chemotherapy. The MRI technologist reports that some of the images were repeated and images obtained are to the best of the patient's ability.  LABS: None obtained  EXAM: BILATERAL BREAST MRI WITH AND WITHOUT CONTRAST  TECHNIQUE: Multiplanar, multisequence MR images of both breasts were obtained prior to and following the intravenous administration of 7 ml of Gadavist  Three-dimensional MR images were rendered by post-processing of the original MR data on an independent workstation. The three-dimensional MR images were interpreted, and findings are reported in the following complete MRI report for this study. Three dimensional images were evaluated at the independent DynaCad workstation  COMPARISON: Bilateral breast MRI April 19, 2018. Bilateral diagnostic mammogram and left breast ultrasound April 01, 2018.  FINDINGS: Breast composition: c. Heterogeneous fibroglandular tissue.  Background parenchymal enhancement: Mild  Right breast: No mass or abnormal enhancement.  Left breast: In the immediate retroareolar left breast, central and just lateral to the nipple are residual linearly oriented areas of enhancement, that extend to the nipple. This area of enhancement measures approximately 1.3 x 0.9 cm axial dimension. Enhancement of the left nipple and left nipple retraction have decreased compared to the MRI of February 2020.  In approximately 6 o'clock retroareolar left breast, 3 cm deep to the nipple, in the middle third of the breast parenchyma, is an enhancing irregular mass measuring 0.7 x 0.5 x 0.7 cm. The anterior to posterior extent of the central nipple/immediate retroareolar enhancement, and the enhancing mass in the middle third of the breast parenchyma is approximately 3.1 cm AP diameter, inclusive. The remainder of the left breast is negative.  Other than slight skin thickening of the areola, there is no evidence of breast skin thickening elsewhere. Pectoralis muscle is negative.  Lymph nodes: A level II lymph node in the upper left axilla is elongate, measuring 1.5 cm in length and 0.4 cm in short axis. Based on its position, and given that it is the largest visible lymph node, this likely reflects the previously biopsied lymph node involved with metastasis, that previously measured up to 5.0 x 2.6 cm in February 2020. Biopsy clip artifact is not well visualized on MRI images in the left axilla today. No suspicious left axillary lymph nodes at this time.  The previously described left internal mammary chain lymph nodes measuring approximately 5-6 mm on the MRI of 04/19/2018 have significantly decreased in size. Currently, no suspicious internal mammary chain lymph nodes are identified. On image 140/248 on the axial T1 precontrast images, an approximately 1 mm  internal mammary chain lymph node is visualized. On image 102/248 of the T1 precontrast images an approximately 1 mm internal mammary chain lymph node is visualized.  Right axilla is negative.  Ancillary findings: Port-A-Cath is noted in the superior right breast medially.  IMPRESSION: Significant positive response to neoadjuvant chemotherapy compared to breast MRI of April 19, 2018. Biopsy-proven malignancy in the left breast has significantly decreased, with small residual areas of enhancement as described above. Similarly, bulky left axillary lymphadenopathy has resolved. Left internal mammary chain lymph nodes are now within normal limits for size.  No MRI evidence of malignancy in the right breast.  RECOMMENDATION: Continue treatment planning.  BI-RADS CATEGORY 6: Known biopsy-proven malignancy.  ADDITIONAL INFORMATION: 1. PROGNOSTIC INDICATORS Results: IMMUNOHISTOCHEMICAL AND MORPHOMETRIC ANALYSIS PERFORMED MANUALLY The tumor cells are EQUIVOCAL for Her2 (2+). Her2 by FISH will be performed and the results reported separately. Estrogen Receptor: 0%, NEGATIVE Progesterone Receptor: 0%, NEGATIVE Proliferation Marker Ki67: 70% COMMENT: The negative hormone receptor study(ies) in this case has an internal positive control. REFERENCE RANGE ESTROGEN RECEPTOR NEGATIVE 0% POSITIVE =>1% REFERENCE RANGE PROGESTERONE RECEPTOR NEGATIVE 0% POSITIVE =>1% All controls stained appropriately JOSHUA KISH MD Pathologist, Electronic Signature ( Signed 04/08/2018) FINAL DIAGNOSIS Diagnosis 1. Breast, left, needle core biopsy, 12 o'clock, retroareolar mass - INVASIVE DUCTAL CARCINOMA. - LYMPHOVASCULAR INVASION IS IDENTIFIED. 1 of 3 FINAL for Siegrist, Mishti ([id number]) Diagnosis(continued) - SEE COMMENT. 2. Lymph node, needle/core biopsy, left axillary - METASTATIC CARCINOMA IN 1 OF 1 LYMPH NODE (1/1). Microscopic Comment 1. The carcinoma appears grade III. A breast  prognostic profile will be performed and the results reported separately. The results were called to the [facility] on 04/07/2018. (JBK:kh 04/07/2018) JOSHUA KISH MD Pathologist, Electronic Signature (Case signed 04/07/2018) Specimen Gross and Clinical Information.  The patient is a 58 year old female.   Allergies (Sabrina Canty, CMA; 09/06/2018 4:25 PM) No Known Allergies [04/11/2018]: Allergies Reconciled  Medication History (Sabrina Canty, CMA; 09/06/2018 4:25 PM) Colace (100MG Capsule, Oral) Active. Medications Reconciled    Vitals (Sabrina Canty CMA; 09/06/2018 4:26 PM) 09/06/2018 4:26 PM Weight: 150.8 lb Height: 64in Body Surface Area: 1.73 m Body Mass Index: 25.88 kg/m  Temp.: 98.7F(Oral)  Pulse: 152 (Regular)  BP: 126/64 (Sitting, Left Arm, Standard)        Physical Exam ( A.  MD; 09/06/2018 5:12 PM)  General Mental Status-Alert. General Appearance-Consistent with stated age. Hydration-Well hydrated. Voice-Normal.  Breast Breast - Left-Symmetric, Non Tender, No Biopsy scars, no Dimpling - Left, No Inflammation, No Lumpectomy scars, No Mastectomy scars, No Peau d' Orange. Breast - Right-Symmetric, Non Tender, No Biopsy scars, no Dimpling - Right, No Inflammation, No Lumpectomy scars, No Mastectomy scars, No Peau d' Orange. Breast Lump-No Palpable Breast Mass.  Neurologic Neurologic evaluation reveals -alert and oriented x 3 with no impairment of recent or remote memory. Mental Status-Normal.  Musculoskeletal Normal Exam - Left-Upper Extremity Strength Normal and Lower Extremity Strength Normal. Normal Exam - Right-Upper Extremity Strength Normal and Lower Extremity Strength Normal.  Lymphatic Head & Neck  General Head & Neck Lymphatics: Bilateral - Description - Normal. Axillary  General Axillary Region: Bilateral - Description - Normal. Tenderness - Non Tender.    Assessment & Plan  ( A.  MD; 09/06/2018 5:08 PM)  BREAST CANCER, LEFT (C50.912) Impression: Good response to neoadjuvant chemotherapy Patient is opted for left breast lumpectomy with target a left axillary seed lymph node biopsy and sentinel lymph node mapping. Report will stay in place for now   for potential trial. Risk of lumpectomy include bleeding, infection, seroma, more surgery, use of seed/wire, wound care, cosmetic deformity and the need for other treatments, death , blood clots, death. Pt agrees to proceed. Risk of sentinel lymph node mapping include bleeding, infection, lymphedema, shoulder pain. stiffness, dye allergy. cosmetic deformity , blood clots, death, need for more surgery. Pt agrees to proceed.  Current Plans You are being scheduled for surgery- Our schedulers will call you.  You should hear from our office's scheduling department within 5 working days about the location, date, and time of surgery. We try to make accommodations for patient's preferences in scheduling surgery, but sometimes the OR schedule or the surgeon's schedule prevents [place] from making those accommodations.  If you have not heard from our office [phone]) in 5 working days, call the office and ask for your surgeon's nurse.  If you have other questions about your diagnosis, plan, or surgery, call the office and ask for your surgeon's nurse.  We discussed the staging and pathophysiology of breast cancer. We discussed all of the different options for treatment for breast cancer including surgery, chemotherapy, radiation therapy, Herceptin, and antiestrogen therapy. We discussed a sentinel lymph node biopsy as she does not appear to having lymph node involvement right now. We discussed the performance of that with injection of radioactive tracer and blue dye. We discussed that she would have an incision underneath her axillary hairline. We discussed that there is a bout a 10-20% chance of having a positive node  with a sentinel lymph node biopsy and we will await the permanent pathology to make any other first further decisions in terms of her treatment. One of these options might be to return to the operating room to perform an axillary lymph node dissection. We discussed about a 1-2% risk lifetime of chronic shoulder pain as well as lymphedema associated with a sentinel lymph node biopsy. We discussed the options for treatment of the breast cancer which included lumpectomy versus a mastectomy. We discussed the performance of the lumpectomy with a wire placement. We discussed a 10-20% chance of a positive margin requiring reexcision in the operating room. We also discussed that she may need radiation therapy or antiestrogen therapy or both if she undergoes lumpectomy. We discussed the mastectomy and the postoperative care for that as well. We discussed that there is no difference in her survival whether she undergoes lumpectomy with radiation therapy or antiestrogen therapy versus a mastectomy. There is a slight difference in the local recurrence rate being 3-5% with lumpectomy and about 1% with a mastectomy. We discussed the risks of operation including bleeding, infection, possible reoperation. She understands her further therapy will be based on what her stages at the time of her operation.  Pt Education - flb breast cancer surgery: discussed with patient and provided information. Pt Education - [facility] Breast Biopsy HCI: discussed with patient and provided information. Pt Education - ABC (After Breast Cancer) Class Info: discussed with patient and provided information.

## 2018-09-08 ENCOUNTER — Other Ambulatory Visit: Payer: Self-pay | Admitting: Surgery

## 2018-09-08 ENCOUNTER — Other Ambulatory Visit: Payer: Self-pay | Admitting: *Deleted

## 2018-09-08 ENCOUNTER — Encounter: Payer: Self-pay | Admitting: Adult Health

## 2018-09-08 DIAGNOSIS — C50912 Malignant neoplasm of unspecified site of left female breast: Secondary | ICD-10-CM

## 2018-09-08 DIAGNOSIS — C50412 Malignant neoplasm of upper-outer quadrant of left female breast: Secondary | ICD-10-CM

## 2018-09-09 ENCOUNTER — Telehealth: Payer: Self-pay | Admitting: Oncology

## 2018-09-09 NOTE — Telephone Encounter (Signed)
Scheduled appt per 7/17 sch message - pt aware of new appt date and time

## 2018-09-12 ENCOUNTER — Encounter: Payer: Self-pay | Admitting: Adult Health

## 2018-09-13 ENCOUNTER — Other Ambulatory Visit: Payer: BLUE CROSS/BLUE SHIELD

## 2018-09-13 ENCOUNTER — Telehealth: Payer: Self-pay | Admitting: *Deleted

## 2018-09-13 ENCOUNTER — Ambulatory Visit: Payer: BLUE CROSS/BLUE SHIELD

## 2018-09-13 NOTE — Telephone Encounter (Signed)
Called pt regarding husband having covid test at [facility]. Pt relate it was done today. They have been appropriately social distancing themselves. Pt informed husband low grade fever resolved last night.

## 2018-09-16 ENCOUNTER — Other Ambulatory Visit: Payer: Self-pay

## 2018-09-16 ENCOUNTER — Encounter (HOSPITAL_BASED_OUTPATIENT_CLINIC_OR_DEPARTMENT_OTHER): Payer: Self-pay | Admitting: *Deleted

## 2018-09-20 ENCOUNTER — Ambulatory Visit: Payer: BLUE CROSS/BLUE SHIELD

## 2018-09-20 ENCOUNTER — Encounter (HOSPITAL_BASED_OUTPATIENT_CLINIC_OR_DEPARTMENT_OTHER)
Admission: RE | Admit: 2018-09-20 | Discharge: 2018-09-20 | Disposition: A | Discharge status: Home / Self Care | Payer: BC Managed Care – PPO | Source: Ambulatory Visit | Attending: Surgery | Admitting: Surgery

## 2018-09-20 ENCOUNTER — Other Ambulatory Visit: Payer: BLUE CROSS/BLUE SHIELD

## 2018-09-20 ENCOUNTER — Other Ambulatory Visit (HOSPITAL_COMMUNITY)
Admission: RE | Admit: 2018-09-20 | Discharge: 2018-09-20 | Disposition: A | Discharge status: Home / Self Care | Payer: BC Managed Care – PPO | Source: Ambulatory Visit | Attending: Surgery | Admitting: Surgery

## 2018-09-20 ENCOUNTER — Other Ambulatory Visit: Payer: Self-pay

## 2018-09-20 DIAGNOSIS — C50912 Malignant neoplasm of unspecified site of left female breast: Secondary | ICD-10-CM | POA: Insufficient documentation

## 2018-09-20 DIAGNOSIS — Z20828 Contact with and (suspected) exposure to other viral communicable diseases: Secondary | ICD-10-CM | POA: Insufficient documentation

## 2018-09-20 DIAGNOSIS — R000 Tachycardia, unspecified: Secondary | ICD-10-CM | POA: Insufficient documentation

## 2018-09-20 DIAGNOSIS — Z01818 Encounter for other preprocedural examination: Secondary | ICD-10-CM | POA: Insufficient documentation

## 2018-09-20 LAB — CBC WITH DIFFERENTIAL/PLATELET
Abs Immature Granulocytes: 0.01 10*3/uL (ref 0.00–0.07)
Basophils Absolute: 0 10*3/uL (ref 0.0–0.1)
Basophils Relative: 1 %
Eosinophils Absolute: 0.1 10*3/uL (ref 0.0–0.5)
Eosinophils Relative: 2 %
HCT: 36.2 % (ref 36.0–46.0)
Hemoglobin: 11.5 g/dL — ABNORMAL LOW (ref 12.0–15.0)
Immature Granulocytes: 0 %
Lymphocytes Relative: 39 %
Lymphs Abs: 1.3 10*3/uL (ref 0.7–4.0)
MCH: 33.3 pg (ref 26.0–34.0)
MCHC: 31.8 g/dL (ref 30.0–36.0)
MCV: 104.9 fL — ABNORMAL HIGH (ref 80.0–100.0)
Monocytes Absolute: 0.4 10*3/uL (ref 0.1–1.0)
Monocytes Relative: 13 %
Neutro Abs: 1.5 10*3/uL — ABNORMAL LOW (ref 1.7–7.7)
Neutrophils Relative %: 45 %
Platelets: 250 10*3/uL (ref 150–400)
RBC: 3.45 MIL/uL — ABNORMAL LOW (ref 3.87–5.11)
RDW: 14 % (ref 11.5–15.5)
WBC: 3.3 10*3/uL — ABNORMAL LOW (ref 4.0–10.5)
nRBC: 0 % (ref 0.0–0.2)

## 2018-09-20 LAB — COMPREHENSIVE METABOLIC PANEL
ALT: 37 U/L (ref 0–44)
AST: 34 U/L (ref 15–41)
Albumin: 3.8 g/dL (ref 3.5–5.0)
Alkaline Phosphatase: 86 U/L (ref 38–126)
Anion gap: 9 (ref 5–15)
BUN: 8 mg/dL (ref 6–20)
CO2: 24 mmol/L (ref 22–32)
Calcium: 9.5 mg/dL (ref 8.9–10.3)
Chloride: 106 mmol/L (ref 98–111)
Creatinine, Ser: 0.72 mg/dL (ref 0.44–1.00)
GFR calc Af Amer: >60 mL/min (ref >60)
GFR calc non Af Amer: >60 mL/min (ref >60)
Glucose, Bld: 96 mg/dL (ref 70–99)
Potassium: 4.5 mmol/L (ref 3.5–5.1)
Sodium: 139 mmol/L (ref 135–145)
Total Bilirubin: 0.5 mg/dL (ref 0.3–1.2)
Total Protein: 6.6 g/dL (ref 6.5–8.1)

## 2018-09-20 LAB — SARS CORONAVIRUS 2 (TAT 6-24 HRS): SARS Coronavirus 2: NEGATIVE

## 2018-09-20 NOTE — Progress Notes (Signed)
Ensure pre surgery drink given with instructions to complete by [place], surgical soap given with instructions, pt verbalized understanding.

## 2018-09-22 ENCOUNTER — Other Ambulatory Visit: Payer: Self-pay

## 2018-09-22 ENCOUNTER — Ambulatory Visit
Admission: RE | Admit: 2018-09-22 | Discharge: 2018-09-22 | Disposition: A | Discharge status: Home / Self Care | Payer: BC Managed Care – PPO | Source: Ambulatory Visit | Attending: Surgery | Admitting: Surgery

## 2018-09-22 DIAGNOSIS — C50912 Malignant neoplasm of unspecified site of left female breast: Secondary | ICD-10-CM

## 2018-09-23 ENCOUNTER — Other Ambulatory Visit: Payer: Self-pay

## 2018-09-23 ENCOUNTER — Ambulatory Visit (HOSPITAL_BASED_OUTPATIENT_CLINIC_OR_DEPARTMENT_OTHER)
Admission: RE | Admit: 2018-09-23 | Discharge: 2018-09-23 | Disposition: A | Discharge status: Home / Self Care | Payer: BC Managed Care – PPO | Attending: Surgery | Admitting: Surgery

## 2018-09-23 ENCOUNTER — Ambulatory Visit
Admission: RE | Admit: 2018-09-23 | Discharge: 2018-09-23 | Disposition: A | Discharge status: Home / Self Care | Payer: BC Managed Care – PPO | Source: Ambulatory Visit | Attending: Surgery | Admitting: Surgery

## 2018-09-23 ENCOUNTER — Ambulatory Visit (HOSPITAL_BASED_OUTPATIENT_CLINIC_OR_DEPARTMENT_OTHER): Payer: BC Managed Care – PPO | Admitting: Anesthesiology

## 2018-09-23 ENCOUNTER — Encounter (HOSPITAL_BASED_OUTPATIENT_CLINIC_OR_DEPARTMENT_OTHER): Payer: Self-pay

## 2018-09-23 ENCOUNTER — Encounter (HOSPITAL_COMMUNITY)
Admission: RE | Admit: 2018-09-23 | Discharge: 2018-09-23 | Disposition: A | Discharge status: Home / Self Care | Payer: BC Managed Care – PPO | Source: Ambulatory Visit | Attending: Surgery | Admitting: Surgery

## 2018-09-23 ENCOUNTER — Encounter (HOSPITAL_BASED_OUTPATIENT_CLINIC_OR_DEPARTMENT_OTHER)
Admission: RE | Disposition: A | Discharge status: Home / Self Care | Payer: Self-pay | Source: Home / Self Care | Attending: Surgery

## 2018-09-23 ENCOUNTER — Encounter: Payer: Self-pay | Admitting: Oncology

## 2018-09-23 DIAGNOSIS — C50912 Malignant neoplasm of unspecified site of left female breast: Secondary | ICD-10-CM

## 2018-09-23 DIAGNOSIS — C773 Secondary and unspecified malignant neoplasm of axilla and upper limb lymph nodes: Secondary | ICD-10-CM | POA: Insufficient documentation

## 2018-09-23 DIAGNOSIS — Z171 Estrogen receptor negative status [ER-]: Secondary | ICD-10-CM | POA: Insufficient documentation

## 2018-09-23 DIAGNOSIS — Z79899 Other long term (current) drug therapy: Secondary | ICD-10-CM | POA: Insufficient documentation

## 2018-09-23 DIAGNOSIS — C50012 Malignant neoplasm of nipple and areola, left female breast: Secondary | ICD-10-CM | POA: Diagnosis not present

## 2018-09-23 DIAGNOSIS — Z9221 Personal history of antineoplastic chemotherapy: Secondary | ICD-10-CM | POA: Insufficient documentation

## 2018-09-23 HISTORY — PX: BREAST LUMPECTOMY WITH RADIOACTIVE SEED AND SENTINEL LYMPH NODE BIOPSY: SHX6550

## 2018-09-23 HISTORY — DX: Headache, unspecified: R51.9

## 2018-09-23 SURGERY — BREAST LUMPECTOMY WITH RADIOACTIVE SEED AND SENTINEL LYMPH NODE BIOPSY
Anesthesia: General | Site: Breast | Laterality: Left

## 2018-09-23 MED ORDER — SCOPOLAMINE 1 MG/3DAYS TD PT72: 1.0000 | MEDICATED_PATCH | Freq: Once | TRANSDERMAL | Status: DC

## 2018-09-23 MED ORDER — OXYCODONE HCL 5 MG/5ML PO SOLN: 5.0000 mg | Freq: Once | ORAL | Status: AC | PRN

## 2018-09-23 MED ORDER — ACETAMINOPHEN 160 MG/5ML PO SOLN: 325.0000 mg | ORAL | Status: DC | PRN

## 2018-09-23 MED ORDER — OXYCODONE HCL 5 MG PO TABS: 5.0000 mg | ORAL_TABLET | Freq: Four times a day (QID) | ORAL | 0 refills | Status: DC | PRN

## 2018-09-23 MED ORDER — PROPOFOL 10 MG/ML IV BOLUS
INTRAVENOUS | Status: DC | PRN
  Administered 2018-09-23: 200 mg via INTRAVENOUS

## 2018-09-23 MED ORDER — LIDOCAINE 2% (20 MG/ML) 5 ML SYRINGE
INTRAMUSCULAR | Status: AC
  Filled 2018-09-23: qty 5

## 2018-09-23 MED ORDER — ONDANSETRON HCL 4 MG/2ML IJ SOLN
INTRAMUSCULAR | Status: DC | PRN
  Administered 2018-09-23: 4 mg via INTRAVENOUS

## 2018-09-23 MED ORDER — PHENYLEPHRINE 40 MCG/ML (10ML) SYRINGE FOR IV PUSH (FOR BLOOD PRESSURE SUPPORT)
PREFILLED_SYRINGE | INTRAVENOUS | Status: DC | PRN
  Administered 2018-09-23: 80 ug via INTRAVENOUS

## 2018-09-23 MED ORDER — OXYCODONE HCL 5 MG PO TABS
5.0000 mg | ORAL_TABLET | Freq: Once | ORAL | Status: AC | PRN
  Administered 2018-09-23: 5 mg via ORAL

## 2018-09-23 MED ORDER — FENTANYL CITRATE (PF) 100 MCG/2ML IJ SOLN: 50.0000 ug | INTRAMUSCULAR | Status: DC | PRN

## 2018-09-23 MED ORDER — FENTANYL CITRATE (PF) 100 MCG/2ML IJ SOLN
INTRAMUSCULAR | Status: AC
  Filled 2018-09-23: qty 2

## 2018-09-23 MED ORDER — SODIUM CHLORIDE (PF) 0.9 % IJ SOLN
INTRAVENOUS | Status: DC | PRN
  Administered 2018-09-23: 5 mL via SUBCUTANEOUS

## 2018-09-23 MED ORDER — FENTANYL CITRATE (PF) 100 MCG/2ML IJ SOLN
25.0000 ug | INTRAMUSCULAR | Status: DC | PRN
  Administered 2018-09-23: 50 ug via INTRAVENOUS

## 2018-09-23 MED ORDER — LACTATED RINGERS IV SOLN
INTRAVENOUS | Status: DC
  Administered 2018-09-23 (×2): via INTRAVENOUS

## 2018-09-23 MED ORDER — CHLORHEXIDINE GLUCONATE CLOTH 2 % EX PADS: 6.0000 | MEDICATED_PAD | Freq: Once | CUTANEOUS | Status: DC

## 2018-09-23 MED ORDER — KETOROLAC TROMETHAMINE 30 MG/ML IJ SOLN: 30.0000 mg | Freq: Once | INTRAMUSCULAR | Status: DC | PRN

## 2018-09-23 MED ORDER — ACETAMINOPHEN 325 MG PO TABS
ORAL_TABLET | ORAL | Status: AC
  Filled 2018-09-23: qty 2

## 2018-09-23 MED ORDER — FENTANYL CITRATE (PF) 100 MCG/2ML IJ SOLN
50.0000 ug | INTRAMUSCULAR | Status: AC | PRN
  Administered 2018-09-23: 100 ug via INTRAVENOUS
  Administered 2018-09-23: 50 ug via INTRAVENOUS
  Administered 2018-09-23: 25 ug via INTRAVENOUS

## 2018-09-23 MED ORDER — PROPOFOL 500 MG/50ML IV EMUL
INTRAVENOUS | Status: AC
  Filled 2018-09-23: qty 50

## 2018-09-23 MED ORDER — ONDANSETRON HCL 4 MG/2ML IJ SOLN: 4.0000 mg | Freq: Once | INTRAMUSCULAR | Status: DC | PRN

## 2018-09-23 MED ORDER — CEFAZOLIN SODIUM-DEXTROSE 2-4 GM/100ML-% IV SOLN
INTRAVENOUS | Status: AC
  Filled 2018-09-23: qty 100

## 2018-09-23 MED ORDER — ACETAMINOPHEN 325 MG PO TABS
325.0000 mg | ORAL_TABLET | ORAL | Status: DC | PRN
  Administered 2018-09-23: 650 mg via ORAL

## 2018-09-23 MED ORDER — MIDAZOLAM HCL 2 MG/2ML IJ SOLN
INTRAMUSCULAR | Status: AC
  Filled 2018-09-23: qty 2

## 2018-09-23 MED ORDER — MIDAZOLAM HCL 2 MG/2ML IJ SOLN
1.0000 mg | INTRAMUSCULAR | Status: DC | PRN
  Administered 2018-09-23: 07:00:00 2 mg via INTRAVENOUS

## 2018-09-23 MED ORDER — IBUPROFEN 800 MG PO TABS: 800.0000 mg | ORAL_TABLET | Freq: Three times a day (TID) | ORAL | 0 refills | Status: DC | PRN

## 2018-09-23 MED ORDER — OXYCODONE HCL 5 MG PO TABS
ORAL_TABLET | ORAL | Status: AC
  Filled 2018-09-23: qty 1

## 2018-09-23 MED ORDER — LIDOCAINE 2% (20 MG/ML) 5 ML SYRINGE
INTRAMUSCULAR | Status: DC | PRN
  Administered 2018-09-23: 60 mg via INTRAVENOUS

## 2018-09-23 MED ORDER — DEXTROSE 5 % IV SOLN
3.0000 g | INTRAVENOUS | Status: AC
  Administered 2018-09-23: 08:00:00 2 g via INTRAVENOUS

## 2018-09-23 MED ORDER — ONDANSETRON HCL 4 MG/2ML IJ SOLN
INTRAMUSCULAR | Status: AC
  Filled 2018-09-23: qty 2

## 2018-09-23 MED ORDER — TECHNETIUM TC 99M SULFUR COLLOID FILTERED
1.0000 | Freq: Once | INTRAVENOUS | Status: AC | PRN
  Administered 2018-09-23: 08:00:00 1 via INTRADERMAL

## 2018-09-23 MED ORDER — DEXAMETHASONE SODIUM PHOSPHATE 4 MG/ML IJ SOLN
INTRAMUSCULAR | Status: DC | PRN
  Administered 2018-09-23: 10 mg via INTRAVENOUS

## 2018-09-23 MED ORDER — BUPIVACAINE HCL (PF) 0.25 % IJ SOLN
INTRAMUSCULAR | Status: DC | PRN
  Administered 2018-09-23 (×9): 5 mL

## 2018-09-23 MED ORDER — BUPIVACAINE-EPINEPHRINE (PF) 0.25% -1:200000 IJ SOLN
INTRAMUSCULAR | Status: DC | PRN
  Administered 2018-09-23: 8 mL via PERINEURAL

## 2018-09-23 MED ORDER — DEXAMETHASONE SODIUM PHOSPHATE 10 MG/ML IJ SOLN
INTRAMUSCULAR | Status: AC
  Filled 2018-09-23: qty 1

## 2018-09-23 MED ORDER — MEPERIDINE HCL 25 MG/ML IJ SOLN: 6.2500 mg | INTRAMUSCULAR | Status: DC | PRN

## 2018-09-23 SURGICAL SUPPLY — 47 items
APPLIER CLIP 9.375 MED OPEN (MISCELLANEOUS) ×3
BINDER BREAST LRG (GAUZE/BANDAGES/DRESSINGS) ×3 IMPLANT
BINDER BREAST MEDIUM (GAUZE/BANDAGES/DRESSINGS) IMPLANT
BINDER BREAST XLRG (GAUZE/BANDAGES/DRESSINGS) IMPLANT
BINDER BREAST XXLRG (GAUZE/BANDAGES/DRESSINGS) IMPLANT
BLADE SURG 15 STRL LF DISP TIS (BLADE) ×2 IMPLANT
BLADE SURG 15 STRL SS (BLADE) ×4
CANISTER SUC SOCK COL 7IN (MISCELLANEOUS) IMPLANT
CANISTER SUCT 1200ML W/VALVE (MISCELLANEOUS) ×3 IMPLANT
CHLORAPREP W/TINT 26 (MISCELLANEOUS) ×3 IMPLANT
CLIP APPLIE 9.375 MED OPEN (MISCELLANEOUS) ×1 IMPLANT
COVER BACK TABLE REUSABLE LG (DRAPES) ×3 IMPLANT
COVER MAYO STAND REUSABLE (DRAPES) ×3 IMPLANT
COVER PROBE W GEL 5X96 (DRAPES) ×3 IMPLANT
COVER WAND RF STERILE (DRAPES) IMPLANT
DECANTER SPIKE VIAL GLASS SM (MISCELLANEOUS) IMPLANT
DERMABOND ADVANCED (GAUZE/BANDAGES/DRESSINGS) ×2
DERMABOND ADVANCED .7 DNX12 (GAUZE/BANDAGES/DRESSINGS) ×1 IMPLANT
DRAPE LAPAROSCOPIC ABDOMINAL (DRAPES) ×3 IMPLANT
DRAPE UTILITY XL STRL (DRAPES) ×3 IMPLANT
ELECT COATED BLADE 2.86 ST (ELECTRODE) ×3 IMPLANT
ELECT REM PT RETURN 9FT ADLT (ELECTROSURGICAL) ×3
ELECTRODE REM PT RTRN 9FT ADLT (ELECTROSURGICAL) ×1 IMPLANT
GLOVE BIOGEL PI IND STRL 8 (GLOVE) ×2 IMPLANT
GLOVE BIOGEL PI INDICATOR 8 (GLOVE) ×4
GLOVE ECLIPSE 8.0 STRL XLNG CF (GLOVE) ×3 IMPLANT
GOWN STRL REUS W/ TWL LRG LVL3 (GOWN DISPOSABLE) ×2 IMPLANT
GOWN STRL REUS W/TWL LRG LVL3 (GOWN DISPOSABLE) ×4
HEMOSTAT ARISTA ABSORB 3G PWDR (HEMOSTASIS) ×3 IMPLANT
HEMOSTAT SNOW SURGICEL 2X4 (HEMOSTASIS) IMPLANT
KIT MARKER MARGIN INK (KITS) ×3 IMPLANT
NDL SAFETY ECLIPSE 18X1.5 (NEEDLE) IMPLANT
NEEDLE HYPO 18GX1.5 SHARP (NEEDLE)
NEEDLE HYPO 25X1 1.5 SAFETY (NEEDLE) ×6 IMPLANT
NS IRRIG 1000ML POUR BTL (IV SOLUTION) ×3 IMPLANT
PACK BASIN DAY SURGERY FS (CUSTOM PROCEDURE TRAY) ×3 IMPLANT
PENCIL BUTTON HOLSTER BLD 10FT (ELECTRODE) ×3 IMPLANT
SLEEVE SCD COMPRESS KNEE MED (MISCELLANEOUS) ×3 IMPLANT
SPONGE LAP 4X18 RFD (DISPOSABLE) ×6 IMPLANT
SUT MNCRL AB 4-0 PS2 18 (SUTURE) ×3 IMPLANT
SUT VICRYL 3-0 CR8 SH (SUTURE) ×3 IMPLANT
SYR CONTROL 10ML LL (SYRINGE) ×3 IMPLANT
TOWEL GREEN STERILE FF (TOWEL DISPOSABLE) ×3 IMPLANT
TRAY FAXITRON CT DISP (TRAY / TRAY PROCEDURE) ×6 IMPLANT
TUBE CONNECTING 20'X1/4 (TUBING) ×1
TUBE CONNECTING 20X1/4 (TUBING) ×2 IMPLANT
YANKAUER SUCT BULB TIP NO VENT (SUCTIONS) ×3 IMPLANT

## 2018-09-23 NOTE — Transfer of Care (Signed)
Immediate Anesthesia Transfer of Care Note  Patient: Kathryn [name]  Procedure(s) Performed: LEFT BREAST LUMPECTOMY WITH RADIOACTIVE SEED AND LEFT AXILLARY TARGETED LYMPH NODE BIOPY AND  LEFT AXILLARY SENTINEL LYMPH NODE MAPPING (Left Breast)  Patient Location: PACU  Anesthesia Type:General  Level of Consciousness: drowsy  Airway & Oxygen Therapy: Patient Spontanous Breathing and Patient connected to nasal cannula oxygen  Post-op Assessment: Report given to RN and Post -op Vital signs reviewed and stable  Post vital signs: Reviewed and stable  Last Vitals:  Vitals Value Taken Time  BP 133/101 09/23/18 0905  Temp    Pulse 103 09/23/18 0907  Resp 9 09/23/18 0907  SpO2 100 % 09/23/18 0907  Vitals shown include unvalidated device data.  Last Pain:  Vitals:   09/23/18 0635  TempSrc: Oral  PainSc: 0-No pain         Complications: No apparent anesthesia complications

## 2018-09-23 NOTE — Interval H&P Note (Signed)
History and Physical Interval Note:  09/23/2018 7:14 AM  Kathryn [name]  has presented today for surgery, with the diagnosis of LEFT BREAST CANCER.  The various methods of treatment have been discussed with the patient and family. After consideration of risks, benefits and other options for treatment, the patient has consented to  Procedure(s): LEFT BREAST LUMPECTOMY WITH RADIOACTIVE SEED AND LEFT AXILLARY TARGETED LYMPH NODE BIOPY AND  LEFT AXILLARY SENTINEL [name] (Left) as a surgical intervention.  The patient's history has been reviewed, patient examined, no change in status, stable for surgery.  I have reviewed the patient's chart and labs.  Questions were answered to the patient's satisfaction.     [place]

## 2018-09-23 NOTE — Anesthesia Procedure Notes (Signed)
Procedure Name: LMA Insertion Date/Time: 09/23/2018 7:53 AM Performed by: Lieutenant Diego, CRNA Pre-anesthesia Checklist: Patient identified, Emergency Drugs available, Suction available and Patient being monitored Patient Re-evaluated:Patient Re-evaluated prior to induction Oxygen Delivery Method: Circle system utilized Preoxygenation: Pre-oxygenation with 100% oxygen Induction Type: IV induction Ventilation: Mask ventilation without difficulty LMA: LMA inserted LMA Size: 4.0 Number of attempts: 1 Placement Confirmation: positive ETCO2 and breath sounds checked- equal and bilateral Tube secured with: Tape Dental Injury: Teeth and Oropharynx as per pre-operative assessment

## 2018-09-23 NOTE — Progress Notes (Signed)
Assisted Dr. Hatchett with left, ultrasound guided, pectoralis block. Side rails up, monitors on throughout procedure. See vital signs in flow sheet. Tolerated Procedure well. 

## 2018-09-23 NOTE — Discharge Instructions (Signed)
[facility] °Office Phone Number [phone] ° °BREAST BIOPSY/ PARTIAL MASTECTOMY: POST OP INSTRUCTIONS ° °Always review your discharge instruction sheet given to you by the facility where your surgery was performed. ° °IF YOU HAVE DISABILITY OR FAMILY LEAVE FORMS, YOU MUST BRING THEM TO THE OFFICE FOR PROCESSING.  DO NOT GIVE THEM TO YOUR DOCTOR. ° °1. A prescription for pain medication may be given to you upon discharge.  Take your pain medication as prescribed, if needed.  If narcotic pain medicine is not needed, then you may take acetaminophen (Tylenol) or ibuprofen (Advil) as needed. °2. Take your usually prescribed medications unless otherwise directed °3. If you need a refill on your pain medication, please contact your pharmacy.  They will contact our office to request authorization.  Prescriptions will not be filled after 5pm or on week-ends. °4. You should eat very light the first 24 hours after surgery, such as soup, crackers, pudding, etc.  Resume your normal diet the day after surgery. °5. Most patients will experience some swelling and bruising in the breast.  Ice packs and a good support bra will help.  Swelling and bruising can take several days to resolve.  °6. It is common to experience some constipation if taking pain medication after surgery.  Increasing fluid intake and taking a stool softener will usually help or prevent this problem from occurring.  A mild laxative (Milk of Magnesia or Miralax) should be taken according to package directions if there are no bowel movements after 48 hours. °7. Unless discharge instructions indicate otherwise, you may remove your bandages 24-48 hours after surgery, and you may shower at that time.  You may have steri-strips (small skin tapes) in place directly over the incision.  These strips should be left on the skin for 7-10 days.  If your surgeon used skin glue on the incision, you may shower in 24 hours.  The glue will flake off over the  next 2-3 weeks.  Any sutures or staples will be removed at the office during your follow-up visit. °8. ACTIVITIES:  You may resume regular daily activities (gradually increasing) beginning the next day.  Wearing a good support bra or sports bra minimizes pain and swelling.  You may have sexual intercourse when it is comfortable. °a. You may drive when you no longer are taking prescription pain medication, you can comfortably wear a seatbelt, and you can safely maneuver your car and apply brakes. °b. RETURN TO WORK:  ______________________________________________________________________________________ °9. You should see your doctor in the office for a follow-up appointment approximately two weeks after your surgery.  Your doctor’s nurse will typically make your follow-up appointment when she calls you with your pathology report.  Expect your pathology report 2-3 business days after your surgery.  You may call to check if you do not hear from us after three days. °10. OTHER INSTRUCTIONS: _______________________________________________________________________________________________ _____________________________________________________________________________________________________________________________________ °_____________________________________________________________________________________________________________________________________ °_____________________________________________________________________________________________________________________________________ ° °WHEN TO CALL YOUR DOCTOR: °1. Fever over 101.0 °2. Nausea and/or vomiting. °3. Extreme swelling or bruising. °4. Continued bleeding from incision. °5. Increased pain, redness, or drainage from the incision. ° °The clinic staff is available to answer your questions during regular business hours.  Please don’t hesitate to call and ask to speak to one of the nurses for clinical concerns.  If you have a medical emergency, go to the nearest  emergency room or call 911.  A surgeon from [facility] is always on call at the hospital. ° °For further questions, please visit [email or web address]  ° ° ° ° °  Post Anesthesia Home Care Instructions ° °Activity: °Get plenty of rest for the remainder of the day. A responsible individual must stay with you for 24 hours following the procedure.  °For the next 24 hours, DO NOT: °-Drive a car °-Operate machinery °-Drink alcoholic beverages °-Take any medication unless instructed by your physician °-Make any legal decisions or sign important papers. ° °Meals: °Start with liquid foods such as gelatin or soup. Progress to regular foods as tolerated. Avoid greasy, spicy, heavy foods. If nausea and/or vomiting occur, drink only clear liquids until the nausea and/or vomiting subsides. Call your physician if vomiting continues. ° °Special Instructions/Symptoms: °Your throat may feel dry or sore from the anesthesia or the breathing tube placed in your throat during surgery. If this causes discomfort, gargle with warm salt water. The discomfort should disappear within 24 hours. ° °If you had a scopolamine patch placed behind your ear for the management of post- operative nausea and/or vomiting: ° °1. The medication in the patch is effective for 72 hours, after which it should be removed.  Wrap patch in a tissue and discard in the trash. Wash hands thoroughly with soap and water. °2. You may remove the patch earlier than 72 hours if you experience unpleasant side effects which may include dry mouth, dizziness or visual disturbances. °3. Avoid touching the patch. Wash your hands with soap and water after contact with the patch. °  ° °

## 2018-09-23 NOTE — Anesthesia Procedure Notes (Signed)
Anesthesia Regional Block: Pectoralis block   Pre-Anesthetic Checklist: ,, timeout performed, Correct Patient, Correct Site, Correct Laterality, Correct Procedure, Correct Position, site marked, Risks and benefits discussed,  Surgical consent,  Pre-op evaluation,  At surgeon's request and post-op pain management  Laterality: Left and N/A  Prep: chloraprep       Needles:  Injection technique: Single-shot  Needle Type: Echogenic Stimulator Needle     Needle Length: 10cm  Needle Gauge: 21   Needle insertion depth: 3 cm   Additional Needles:   Procedures:,,,, ultrasound used (permanent image in chart),,,,  Narrative:  Start time: 09/23/2018 7:15 AM End time: 09/23/2018 7:25 AM Injection made incrementally with aspirations every 5 mL.  Performed by: Personally  Anesthesiologist: Lyn Hollingshead, MD

## 2018-09-23 NOTE — Anesthesia Preprocedure Evaluation (Signed)
Anesthesia Evaluation  Patient identified by MRN, date of birth, ID band Patient awake    Reviewed: Allergy & Precautions, NPO status , Patient's Chart, lab work & pertinent test results  Airway Mallampati: I       Dental no notable dental hx. (+) Teeth Intact   Pulmonary neg pulmonary ROS,    Pulmonary exam normal breath sounds clear to auscultation       Cardiovascular negative cardio ROS Normal cardiovascular exam Rhythm:Regular Rate:Normal     Neuro/Psych negative psych ROS   GI/Hepatic negative GI ROS, Neg liver ROS,   Endo/Other  negative endocrine ROS  Renal/GU negative Renal ROS  negative genitourinary   Musculoskeletal negative musculoskeletal ROS (+)   Abdominal Normal abdominal exam  (+)   Peds  Hematology negative hematology ROS (+)   Anesthesia Other Findings   Reproductive/Obstetrics                             Anesthesia Physical Anesthesia Plan  ASA: II  Anesthesia Plan: General   Post-op Pain Management:  Regional for Post-op pain   Induction: Intravenous  PONV Risk Score and Plan: 3 and Ondansetron, Dexamethasone and Midazolam  Airway Management Planned: LMA  Additional Equipment:   Intra-op Plan:   Post-operative Plan: Extubation in OR  Informed Consent: I have reviewed the patients History and Physical, chart, labs and discussed the procedure including the risks, benefits and alternatives for the proposed anesthesia with the patient or authorized representative who has indicated his/her understanding and acceptance.     Dental advisory given  Plan Discussed with: CRNA  Anesthesia Plan Comments:         Anesthesia Quick Evaluation

## 2018-09-23 NOTE — Progress Notes (Signed)
Assisted Eddie, nuc med tech, with nuc med injections. Side rails up, monitors on throughout procedure. See vital signs in flow sheet. Tolerated Procedure well. 

## 2018-09-23 NOTE — Anesthesia Postprocedure Evaluation (Signed)
Anesthesia Post Note  Patient: Kathryn [name]  Procedure(s) Performed: LEFT BREAST LUMPECTOMY WITH RADIOACTIVE SEED AND LEFT AXILLARY TARGETED LYMPH NODE BIOPY AND  LEFT AXILLARY SENTINEL LYMPH NODE MAPPING (Left Breast)     Patient location during evaluation: PACU Anesthesia Type: General Level of consciousness: awake Pain management: pain level controlled Vital Signs Assessment: post-procedure vital signs reviewed and stable Respiratory status: spontaneous breathing Cardiovascular status: stable Postop Assessment: no apparent nausea or vomiting Anesthetic complications: no    Last Vitals:  Vitals:   09/23/18 0945 09/23/18 1000  BP: 135/81 115/82  Pulse: 96 92  Resp: 15 14  Temp:    SpO2: 98% 99%    Last Pain:  Vitals:   09/23/18 0945  TempSrc:   PainSc: 4    Pain Goal:                   Huston Foley

## 2018-09-23 NOTE — Op Note (Signed)
Preoperative diagnosis: Stage III left breast cancer  Postoperative diagnosis: Same  Procedure: Left breast seed localized lumpectomy with targeted left axillary lymph node biopsy and left axillary sentinel lymph node mapping with methylene blue dye  Surgeon: Erroll Luna, MD  Anesthesia: General with pectoral block and local  EBL: 40 cc  Specimen: Left breast mass with seed and clip left axillary sentinel lymph node and targeted node with seed and clip  Drains: None  IV fluids: Per anesthesia record  Indications for procedure: The patient's a 58 year old female who underwent neoadjuvant chemotherapy for triple negative left breast cancer.  She had a great response and presents for breast conserving surgery today.  Risk, benefits and other options were discussed with the patient.The procedure has been discussed with the patient. Alternatives to surgery have been discussed with the patient.  Risks of surgery include bleeding,  Infection,  Seroma formation, death,  and the need for further surgery.   The patient understands and wishes to proceed.Sentinel lymph node mapping and dissection has been discussed with the patient.  Risk of bleeding,  Infection,  Seroma formation,  Additional procedures,,  Shoulder weakness ,  Shoulder stiffness,  Nerve and blood vessel injury and reaction to the mapping dyes have been discussed.  Alternatives to surgery have been discussed with the patient.  The patient agrees to proceed.    Description of procedure: The patient was met in the holding area.  She underwent pectoral block and left anesthesia.  She underwent injection of the left breast with technetium sulfur colloid.  Neoprobe was used to verify left breast seed location and activity.  She had no further questions.  She was taken back to the operative room placed supine upon the operating table.  After induction of general anesthesia, the left breast was prepped and draped in sterile fashion timeout was  performed.  4 cc of methylene blue dye were injected under the left nipple massage for 5 minutes.  Neoprobe was used to identify the seed which was in the retroareolar position with the clip.  Curvilinear incision was made in the lateral border of the nipple areolar complex.  Dissection was carried under the nipple and the entire nipple was elevated off this area.  This was down to the skin of the nipple.  The area was excised underneath containing the clip and seed with a grossly negative margin.  Faxitron revealed both seed and clip to be in specimen.  The cavity was then made hemostatic with cautery.  Arista was applied and the cavity was closed with 3-0 Vicryl and 4-0 Monocryl after placing clips.  The left axilla was done next.  Neoprobe was used to identify the seed location in the left axilla.  A 3 cm incision was made along the inferior part of the axillary hairline.  Dissection was carried to the level 1 contents.  The deep note was notified containing both the seed and clip was excised.  He was also evaluated for technetium activity and this was high as well as this was both the targeted node and sentinel node.  Faxitron revealed the seed clip to be in the specimen.  The remainder the left axilla showed no further activity and background counts approached 0.  There is no evidence of any blue dye in the left axilla as well.  Hemostasis was achieved with cautery and Arista was applied as well.  3-0 Vicryl was used to close deep layer and 4 Monocryl was used to close skin in a  subcuticular fashion.  Dermabond applied to both.  Breast binder placed.  All final counts found to be correct.  The patient was awoke extubated taken to recovery in satisfactory condition.

## 2018-09-26 ENCOUNTER — Other Ambulatory Visit: Payer: BC Managed Care – PPO

## 2018-09-26 ENCOUNTER — Encounter (HOSPITAL_BASED_OUTPATIENT_CLINIC_OR_DEPARTMENT_OTHER): Payer: Self-pay | Admitting: Surgery

## 2018-09-26 ENCOUNTER — Ambulatory Visit: Payer: BC Managed Care – PPO | Admitting: Oncology

## 2018-09-27 ENCOUNTER — Other Ambulatory Visit: Payer: BLUE CROSS/BLUE SHIELD

## 2018-09-27 ENCOUNTER — Ambulatory Visit: Payer: BLUE CROSS/BLUE SHIELD

## 2018-09-28 ENCOUNTER — Encounter: Payer: Self-pay | Admitting: Adult Health

## 2018-09-28 ENCOUNTER — Other Ambulatory Visit: Payer: Self-pay | Admitting: Oncology

## 2018-09-29 ENCOUNTER — Telehealth: Payer: Self-pay | Admitting: *Deleted

## 2018-09-29 ENCOUNTER — Other Ambulatory Visit: Payer: Self-pay | Admitting: Oncology

## 2018-09-29 NOTE — Telephone Encounter (Signed)
Spoke with patient today and let her know her path is still pending from her surgery.  I did speak with pathology and am waiting on an update. We can move up her appointment with Dr.Magrinat when I know when her path will be ready.  Patient verbalized understanding.

## 2018-09-30 ENCOUNTER — Ambulatory Visit: Payer: Self-pay | Admitting: Surgery

## 2018-10-03 ENCOUNTER — Other Ambulatory Visit (HOSPITAL_COMMUNITY): Payer: BC Managed Care – PPO

## 2018-10-05 ENCOUNTER — Other Ambulatory Visit: Payer: BC Managed Care – PPO

## 2018-10-06 ENCOUNTER — Ambulatory Visit: Payer: BC Managed Care – PPO

## 2018-10-06 ENCOUNTER — Ambulatory Visit (HOSPITAL_COMMUNITY): Payer: BC Managed Care – PPO

## 2018-10-07 ENCOUNTER — Other Ambulatory Visit (HOSPITAL_COMMUNITY)
Admission: RE | Admit: 2018-10-07 | Discharge: 2018-10-07 | Disposition: A | Discharge status: Home / Self Care | Payer: BC Managed Care – PPO | Source: Ambulatory Visit | Attending: Surgery | Admitting: Surgery

## 2018-10-07 DIAGNOSIS — Z20828 Contact with and (suspected) exposure to other viral communicable diseases: Secondary | ICD-10-CM | POA: Insufficient documentation

## 2018-10-07 DIAGNOSIS — Z01812 Encounter for preprocedural laboratory examination: Secondary | ICD-10-CM | POA: Diagnosis not present

## 2018-10-07 LAB — SARS CORONAVIRUS 2 (TAT 6-24 HRS): SARS Coronavirus 2: NEGATIVE

## 2018-10-10 ENCOUNTER — Other Ambulatory Visit: Payer: Self-pay

## 2018-10-10 ENCOUNTER — Encounter (HOSPITAL_COMMUNITY): Payer: Self-pay | Admitting: *Deleted

## 2018-10-10 MED ORDER — BUPIVACAINE LIPOSOME 1.3 % IJ SUSP
20.0000 mL | Freq: Once | INTRAMUSCULAR | Status: DC
  Filled 2018-10-10: qty 20

## 2018-10-10 NOTE — Progress Notes (Signed)
Kathryn [name] denies chest pain or shortness of  breath. Patient was tested for Covid 10/07/2018 and has been in quarantine with family.  I instructed patient to not take any more Advil today or am. I instructed patient the cell phone should be turned off when staff is with patient; to avoid distraction of patient of staff.

## 2018-10-10 NOTE — Anesthesia Preprocedure Evaluation (Addendum)
Anesthesia Evaluation  Patient identified by MRN, date of birth, ID band Patient awake    Reviewed: Allergy & Precautions, H&P , NPO status , Patient's Chart, lab work & pertinent test results  Airway Mallampati: II  TM Distance: >3 FB Neck ROM: Full    Dental no notable dental hx. (+) Teeth Intact, Dental Advisory Given   Pulmonary neg pulmonary ROS,    Pulmonary exam normal breath sounds clear to auscultation       Cardiovascular Exercise Tolerance: Good negative cardio ROS   Rhythm:Regular Rate:Normal     Neuro/Psych  Headaches, negative psych ROS   GI/Hepatic negative GI ROS, Neg liver ROS,   Endo/Other  negative endocrine ROS  Renal/GU negative Renal ROS  negative genitourinary   Musculoskeletal   Abdominal   Peds  Hematology negative hematology ROS (+)   Anesthesia Other Findings   Reproductive/Obstetrics negative OB ROS                            Anesthesia Physical Anesthesia Plan  ASA: II  Anesthesia Plan: General   Post-op Pain Management:  Regional for Post-op pain   Induction: Intravenous  PONV Risk Score and Plan: 3 and Ondansetron, Dexamethasone and Midazolam  Airway Management Planned: LMA  Additional Equipment:   Intra-op Plan:   Post-operative Plan: Extubation in OR  Informed Consent: I have reviewed the patients History and Physical, chart, labs and discussed the procedure including the risks, benefits and alternatives for the proposed anesthesia with the patient or authorized representative who has indicated his/her understanding and acceptance.     Dental advisory given  Plan Discussed with: CRNA  Anesthesia Plan Comments:         Anesthesia Quick Evaluation

## 2018-10-11 ENCOUNTER — Ambulatory Visit (HOSPITAL_COMMUNITY): Payer: BC Managed Care – PPO | Admitting: Anesthesiology

## 2018-10-11 ENCOUNTER — Encounter (HOSPITAL_COMMUNITY)
Admission: RE | Disposition: A | Discharge status: Home / Self Care | Payer: Self-pay | Source: Home / Self Care | Attending: Surgery

## 2018-10-11 ENCOUNTER — Encounter (HOSPITAL_COMMUNITY): Payer: Self-pay

## 2018-10-11 ENCOUNTER — Ambulatory Visit: Payer: BC Managed Care – PPO | Admitting: Radiation Oncology

## 2018-10-11 ENCOUNTER — Other Ambulatory Visit: Payer: Self-pay

## 2018-10-11 ENCOUNTER — Ambulatory Visit (HOSPITAL_COMMUNITY)
Admission: RE | Admit: 2018-10-11 | Discharge: 2018-10-11 | Disposition: A | Discharge status: Home / Self Care | Payer: BC Managed Care – PPO | Attending: Surgery | Admitting: Surgery

## 2018-10-11 DIAGNOSIS — C773 Secondary and unspecified malignant neoplasm of axilla and upper limb lymph nodes: Secondary | ICD-10-CM | POA: Diagnosis not present

## 2018-10-11 DIAGNOSIS — Z9221 Personal history of antineoplastic chemotherapy: Secondary | ICD-10-CM | POA: Diagnosis not present

## 2018-10-11 DIAGNOSIS — C50912 Malignant neoplasm of unspecified site of left female breast: Secondary | ICD-10-CM | POA: Diagnosis not present

## 2018-10-11 HISTORY — PX: AXILLARY LYMPH NODE DISSECTION: SHX5229

## 2018-10-11 LAB — CBC WITH DIFFERENTIAL/PLATELET
Abs Immature Granulocytes: 0.01 10*3/uL (ref 0.00–0.07)
Basophils Absolute: 0 10*3/uL (ref 0.0–0.1)
Basophils Relative: 1 %
Eosinophils Absolute: 0.1 10*3/uL (ref 0.0–0.5)
Eosinophils Relative: 2 %
HCT: 36.3 % (ref 36.0–46.0)
Hemoglobin: 11.4 g/dL — ABNORMAL LOW (ref 12.0–15.0)
Immature Granulocytes: 0 %
Lymphocytes Relative: 30 %
Lymphs Abs: 1.3 10*3/uL (ref 0.7–4.0)
MCH: 31.9 pg (ref 26.0–34.0)
MCHC: 31.4 g/dL (ref 30.0–36.0)
MCV: 101.7 fL — ABNORMAL HIGH (ref 80.0–100.0)
Monocytes Absolute: 0.6 10*3/uL (ref 0.1–1.0)
Monocytes Relative: 13 %
Neutro Abs: 2.3 10*3/uL (ref 1.7–7.7)
Neutrophils Relative %: 54 %
Platelets: 228 10*3/uL (ref 150–400)
RBC: 3.57 MIL/uL — ABNORMAL LOW (ref 3.87–5.11)
RDW: 13 % (ref 11.5–15.5)
WBC: 4.2 10*3/uL (ref 4.0–10.5)
nRBC: 0 % (ref 0.0–0.2)

## 2018-10-11 LAB — COMPREHENSIVE METABOLIC PANEL
ALT: 23 U/L (ref 0–44)
AST: 22 U/L (ref 15–41)
Albumin: 3.6 g/dL (ref 3.5–5.0)
Alkaline Phosphatase: 76 U/L (ref 38–126)
Anion gap: 10 (ref 5–15)
BUN: 13 mg/dL (ref 6–20)
CO2: 21 mmol/L — ABNORMAL LOW (ref 22–32)
Calcium: 9.2 mg/dL (ref 8.9–10.3)
Chloride: 106 mmol/L (ref 98–111)
Creatinine, Ser: 0.63 mg/dL (ref 0.44–1.00)
GFR calc Af Amer: >60 mL/min (ref >60)
GFR calc non Af Amer: >60 mL/min (ref >60)
Glucose, Bld: 97 mg/dL (ref 70–99)
Potassium: 3.8 mmol/L (ref 3.5–5.1)
Sodium: 137 mmol/L (ref 135–145)
Total Bilirubin: 0.7 mg/dL (ref 0.3–1.2)
Total Protein: 6.4 g/dL — ABNORMAL LOW (ref 6.5–8.1)

## 2018-10-11 SURGERY — LYMPHADENECTOMY, AXILLARY
Anesthesia: General | Site: Axilla | Laterality: Left

## 2018-10-11 MED ORDER — 0.9 % SODIUM CHLORIDE (POUR BTL) OPTIME
TOPICAL | Status: DC | PRN
  Administered 2018-10-11: 1000 mL

## 2018-10-11 MED ORDER — BUPIVACAINE-EPINEPHRINE (PF) 0.25% -1:200000 IJ SOLN
INTRAMUSCULAR | Status: AC
  Filled 2018-10-11: qty 30

## 2018-10-11 MED ORDER — LACTATED RINGERS IV SOLN
INTRAVENOUS | Status: DC | PRN
  Administered 2018-10-11 (×2): via INTRAVENOUS

## 2018-10-11 MED ORDER — EPHEDRINE SULFATE-NACL 50-0.9 MG/10ML-% IV SOSY
PREFILLED_SYRINGE | INTRAVENOUS | Status: DC | PRN
  Administered 2018-10-11 (×2): 10 mg via INTRAVENOUS
  Administered 2018-10-11: 5 mg via INTRAVENOUS

## 2018-10-11 MED ORDER — CEFAZOLIN SODIUM-DEXTROSE 2-4 GM/100ML-% IV SOLN
2.0000 g | INTRAVENOUS | Status: AC
  Administered 2018-10-11: 2 g via INTRAVENOUS
  Filled 2018-10-11: qty 100

## 2018-10-11 MED ORDER — BUPIVACAINE HCL (PF) 0.5 % IJ SOLN
INTRAMUSCULAR | Status: DC | PRN
  Administered 2018-10-11: 20 mL

## 2018-10-11 MED ORDER — DEXAMETHASONE SODIUM PHOSPHATE 4 MG/ML IJ SOLN
INTRAMUSCULAR | Status: DC | PRN
  Administered 2018-10-11: 10 mg via INTRAVENOUS

## 2018-10-11 MED ORDER — PROPOFOL 10 MG/ML IV BOLUS
INTRAVENOUS | Status: DC | PRN
  Administered 2018-10-11: 140 mg via INTRAVENOUS

## 2018-10-11 MED ORDER — PHENYLEPHRINE 40 MCG/ML (10ML) SYRINGE FOR IV PUSH (FOR BLOOD PRESSURE SUPPORT)
PREFILLED_SYRINGE | INTRAVENOUS | Status: AC
  Filled 2018-10-11: qty 10

## 2018-10-11 MED ORDER — ONDANSETRON HCL 4 MG/2ML IJ SOLN
INTRAMUSCULAR | Status: DC | PRN
  Administered 2018-10-11: 4 mg via INTRAVENOUS

## 2018-10-11 MED ORDER — HEMOSTATIC AGENTS (NO CHARGE) OPTIME
TOPICAL | Status: DC | PRN
  Administered 2018-10-11: 1 via TOPICAL

## 2018-10-11 MED ORDER — DEXAMETHASONE SODIUM PHOSPHATE 10 MG/ML IJ SOLN
INTRAMUSCULAR | Status: AC
  Filled 2018-10-11: qty 1

## 2018-10-11 MED ORDER — FENTANYL CITRATE (PF) 250 MCG/5ML IJ SOLN
INTRAMUSCULAR | Status: AC
  Filled 2018-10-11: qty 5

## 2018-10-11 MED ORDER — IBUPROFEN 800 MG PO TABS: 800.0000 mg | ORAL_TABLET | Freq: Three times a day (TID) | ORAL | 0 refills | Status: DC | PRN

## 2018-10-11 MED ORDER — GABAPENTIN 300 MG PO CAPS
300.0000 mg | ORAL_CAPSULE | ORAL | Status: AC
  Administered 2018-10-11: 300 mg via ORAL
  Filled 2018-10-11: qty 1

## 2018-10-11 MED ORDER — CHLORHEXIDINE GLUCONATE CLOTH 2 % EX PADS: 6.0000 | MEDICATED_PAD | Freq: Once | CUTANEOUS | Status: DC

## 2018-10-11 MED ORDER — BUPIVACAINE-EPINEPHRINE 0.25% -1:200000 IJ SOLN
INTRAMUSCULAR | Status: DC | PRN
  Administered 2018-10-11: 10 mL

## 2018-10-11 MED ORDER — MIDAZOLAM HCL 2 MG/2ML IJ SOLN
INTRAMUSCULAR | Status: AC
  Filled 2018-10-11: qty 2

## 2018-10-11 MED ORDER — ACETAMINOPHEN 500 MG PO TABS
1000.0000 mg | ORAL_TABLET | ORAL | Status: AC
  Administered 2018-10-11: 1000 mg via ORAL
  Filled 2018-10-11: qty 2

## 2018-10-11 MED ORDER — STERILE WATER FOR INJECTION IJ SOLN
INTRAMUSCULAR | Status: DC | PRN
  Administered 2018-10-11: 1000 mL

## 2018-10-11 MED ORDER — LIDOCAINE 2% (20 MG/ML) 5 ML SYRINGE
INTRAMUSCULAR | Status: AC
  Filled 2018-10-11: qty 5

## 2018-10-11 MED ORDER — ONDANSETRON HCL 4 MG/2ML IJ SOLN
INTRAMUSCULAR | Status: AC
  Filled 2018-10-11: qty 2

## 2018-10-11 MED ORDER — PHENYLEPHRINE 40 MCG/ML (10ML) SYRINGE FOR IV PUSH (FOR BLOOD PRESSURE SUPPORT)
PREFILLED_SYRINGE | INTRAVENOUS | Status: DC | PRN
  Administered 2018-10-11: 40 ug via INTRAVENOUS
  Administered 2018-10-11: 80 ug via INTRAVENOUS
  Administered 2018-10-11: 40 ug via INTRAVENOUS
  Administered 2018-10-11 (×3): 80 ug via INTRAVENOUS

## 2018-10-11 MED ORDER — LIDOCAINE 2% (20 MG/ML) 5 ML SYRINGE
INTRAMUSCULAR | Status: DC | PRN
  Administered 2018-10-11: 40 mg via INTRAVENOUS

## 2018-10-11 MED ORDER — FENTANYL CITRATE (PF) 100 MCG/2ML IJ SOLN
INTRAMUSCULAR | Status: DC | PRN
  Administered 2018-10-11: 50 ug via INTRAVENOUS
  Administered 2018-10-11: 25 ug via INTRAVENOUS
  Administered 2018-10-11 (×2): 50 ug via INTRAVENOUS
  Administered 2018-10-11: 75 ug via INTRAVENOUS

## 2018-10-11 MED ORDER — BUPIVACAINE LIPOSOME 1.3 % IJ SUSP
INTRAMUSCULAR | Status: DC | PRN
  Administered 2018-10-11: 10 mL

## 2018-10-11 MED ORDER — OXYCODONE HCL 5 MG PO TABS: 5.0000 mg | ORAL_TABLET | Freq: Four times a day (QID) | ORAL | 0 refills | Status: DC | PRN

## 2018-10-11 MED ORDER — MIDAZOLAM HCL 5 MG/5ML IJ SOLN
INTRAMUSCULAR | Status: DC | PRN
  Administered 2018-10-11: 2 mg via INTRAVENOUS

## 2018-10-11 SURGICAL SUPPLY — 42 items
APPLIER CLIP 9.375 MED OPEN (MISCELLANEOUS) ×2
BIOPATCH RED 1 DISK 7.0 (GAUZE/BANDAGES/DRESSINGS) ×2 IMPLANT
BLADE CLIPPER SURG (BLADE) IMPLANT
CANISTER SUCT 3000ML PPV (MISCELLANEOUS) ×2 IMPLANT
CHLORAPREP W/TINT 26 (MISCELLANEOUS) ×2 IMPLANT
CLIP APPLIE 9.375 MED OPEN (MISCELLANEOUS) ×1 IMPLANT
COVER SURGICAL LIGHT HANDLE (MISCELLANEOUS) ×2 IMPLANT
COVER WAND RF STERILE (DRAPES) ×2 IMPLANT
DECANTER SPIKE VIAL GLASS SM (MISCELLANEOUS) ×2 IMPLANT
DERMABOND ADVANCED (GAUZE/BANDAGES/DRESSINGS) ×1
DERMABOND ADVANCED .7 DNX12 (GAUZE/BANDAGES/DRESSINGS) ×1 IMPLANT
DRAIN CHANNEL 19F RND (DRAIN) ×2 IMPLANT
DRAPE CHEST BREAST 15X10 FENES (DRAPES) ×2 IMPLANT
DRSG PAD ABDOMINAL 8X10 ST (GAUZE/BANDAGES/DRESSINGS) IMPLANT
DRSG TEGADERM 2-3/8X2-3/4 SM (GAUZE/BANDAGES/DRESSINGS) ×2 IMPLANT
ELECT REM PT RETURN 9FT ADLT (ELECTROSURGICAL) ×2
ELECTRODE REM PT RTRN 9FT ADLT (ELECTROSURGICAL) ×1 IMPLANT
EVACUATOR SILICONE 100CC (DRAIN) ×2 IMPLANT
GAUZE SPONGE 4X4 12PLY STRL (GAUZE/BANDAGES/DRESSINGS) IMPLANT
GLOVE BIO SURGEON STRL SZ8 (GLOVE) ×2 IMPLANT
GLOVE BIOGEL PI IND STRL 8 (GLOVE) ×1 IMPLANT
GLOVE BIOGEL PI INDICATOR 8 (GLOVE) ×1
GOWN STRL REUS W/ TWL LRG LVL3 (GOWN DISPOSABLE) ×1 IMPLANT
GOWN STRL REUS W/ TWL XL LVL3 (GOWN DISPOSABLE) ×1 IMPLANT
GOWN STRL REUS W/TWL LRG LVL3 (GOWN DISPOSABLE) ×1
GOWN STRL REUS W/TWL XL LVL3 (GOWN DISPOSABLE) ×1
HEMOSTAT ARISTA ABSORB 3G PWDR (HEMOSTASIS) ×2 IMPLANT
KIT BASIN OR (CUSTOM PROCEDURE TRAY) ×2 IMPLANT
KIT TURNOVER KIT B (KITS) ×2 IMPLANT
NEEDLE HYPO 25GX1X1/2 BEV (NEEDLE) ×2 IMPLANT
NS IRRIG 1000ML POUR BTL (IV SOLUTION) ×2 IMPLANT
PACK GENERAL/GYN (CUSTOM PROCEDURE TRAY) ×2 IMPLANT
PAD ARMBOARD 7.5X6 YLW CONV (MISCELLANEOUS) ×2 IMPLANT
PENCIL SMOKE EVACUATOR (MISCELLANEOUS) ×2 IMPLANT
SPECIMEN JAR MEDIUM (MISCELLANEOUS) ×2 IMPLANT
SUT ETHILON 2 0 FS 18 (SUTURE) ×2 IMPLANT
SUT MNCRL AB 4-0 PS2 18 (SUTURE) ×2 IMPLANT
SUT VIC AB 3-0 SH 18 (SUTURE) ×2 IMPLANT
SUT VICRYL AB 3 0 TIES (SUTURE) ×2 IMPLANT
SYR CONTROL 10ML LL (SYRINGE) ×2 IMPLANT
TOWEL GREEN STERILE (TOWEL DISPOSABLE) ×2 IMPLANT
TOWEL GREEN STERILE FF (TOWEL DISPOSABLE) ×2 IMPLANT

## 2018-10-11 NOTE — Transfer of Care (Signed)
Immediate Anesthesia Transfer of Care Note  Patient: Kathryn [name]  Procedure(s) Performed: LEFT AXILLARY LYMPH NODE DISSECTION (Left Axilla)  Patient Location: PACU  Anesthesia Type:General  Level of Consciousness: awake, alert  and oriented  Airway & Oxygen Therapy: Patient Spontanous Breathing and Patient connected to nasal cannula oxygen  Post-op Assessment: Report given to RN and Post -op Vital signs reviewed and stable  Post vital signs: Reviewed and stable  Last Vitals:  Vitals Value Taken Time  BP 145/85 10/11/18 0848  Temp    Pulse 117 10/11/18 0849  Resp 16 10/11/18 0849  SpO2 99 % 10/11/18 0849  Vitals shown include unvalidated device data.  Last Pain:  Vitals:   10/11/18 0654  PainSc: 2          Complications: No apparent anesthesia complications

## 2018-10-11 NOTE — Interval H&P Note (Signed)
History and Physical Interval Note:  10/11/2018 7:34 AM  Kathryn [name]  has presented today for surgery, with the diagnosis of LEFT BREAST CANCER.  The various methods of treatment have been discussed with the patient and family. After consideration of risks, benefits and other options for treatment, the patient has consented to  Procedure(s): LEFT AXILLARY LYMPH NODE DISSECTION (Left) as a surgical intervention.  The patient's history has been reviewed, patient examined, no change in status, stable for surgery.  I have reviewed the patient's chart and labs.  Questions were answered to the patient's satisfaction.     [place]

## 2018-10-11 NOTE — Op Note (Signed)
Preoperative diagnosis: Left breast cancer stage II  Postoperative diagnosis: Same  Procedure: Left axillary lymph node dissection  Surgeon: Erroll Luna, MD  Anesthesia: General with pectoral block and 0.25% Sensorcaine local with epinephrine  EBL 20 cc  Drains: 19 round  Indications for procedure: The patient is a 58 year old female with left breast cancer.  She underwent neoadjuvant chemotherapy and subsequent breast conserving surgery.  She had a positive sentinel node after neoadjuvant chemotherapy therefore we recommended left axial lymph node dissection for local control.  Risk of bleeding, infection, lymphedema, nerve injury, blood vessel injury, blood clot, and the need for the procedures and/or treatments discussed with the patient.  Nonoperative management discussed.  She agreed to proceed.  Description of procedure: The patient was met in the holding area and questions were answered.  Left side was marked as correct.  She was brought back to the operating room and placed supine upon the operating table.  She underwent a pectoral block left.  Timeout was done after sterile prep and drape and adequate level of general anesthesia.  The old incision was opened.  The cerumen was evacuated.  All lymphovascular tissue between the long thoracic nerve, thoracodorsal trunk and axillary vein were excised.  Upon inspection there is no residual lymphatic tissue that I could see and all 3 structures were bare.  No evidence of injury to the above.  Specimen sent to pathology as axillary contents.  Arista was placed.  19 round drain was placed through separate stab incision.  Hemostasis achieved.  The wound was closed with 3-0 Vicryl and 4-0 Monocryl.  Dermabond applied.  I reapplied a layer of Dermabond to the breast incision as well.  All final counts found to be correct.  Bulb placed to suction.  The patient was awoke extubated taken to recovery in satisfactory condition.

## 2018-10-11 NOTE — H&P (Signed)
Kathryn [name] is an 58 y.o. female.   Chief Complaint: left breast cancer HPI: pt here for completion LALND  Past Medical History:  Diagnosis Date  . Breast cancer ([place])    stage 3 - left  . Family history of kidney cancer   . Family history of melanoma   . Headache    hormone related, none since 1st child was born     Past Surgical History:  Procedure Laterality Date  . BREAST LUMPECTOMY WITH RADIOACTIVE SEED AND SENTINEL LYMPH NODE BIOPSY Left 09/23/2018   Procedure: LEFT BREAST LUMPECTOMY WITH RADIOACTIVE SEED AND LEFT AXILLARY TARGETED LYMPH NODE BIOPY AND  LEFT AXILLARY SENTINEL LYMPH NODE MAPPING;  Surgeon: Erroll Luna, MD;  Location: [facility];  Service: General;  Laterality: Left;  . CESAREAN SECTION     x3  . PORTACATH PLACEMENT N/A 04/28/2018   Procedure: INSERTION PORT-A-CATH WITH ULTRASOUND;  Surgeon: Erroll Luna, MD;  Location: [facility];  Service: General;  Laterality: N/A;    Family History  Problem Relation Age of Onset  . Kidney cancer Sister 83       d. 60  . Lung cancer Paternal Uncle   . Melanoma Sister   . Melanoma Sister    Social History:  reports that she has never smoked. She has never used smokeless tobacco. She reports previous alcohol use. She reports that she does not use drugs.  Allergies: No Known Allergies  Medications Prior to Admission  Medication Sig Dispense Refill  . docusate sodium (COLACE) 50 MG capsule Take 50 mg by mouth daily.    Marland Kitchen ibuprofen (ADVIL) 800 MG tablet Take 1 tablet (800 mg total) by mouth every 8 (eight) hours as needed. (Patient taking differently: Take 800 mg by mouth every 8 (eight) hours as needed for headache or moderate pain. ) 30 tablet 0  . lidocaine-prilocaine (EMLA) cream Apply to affected area once (Patient taking differently: Apply 1 application topically once. ) 30 g 3  . loratadine (CLARITIN) 10 MG tablet Take 10 mg by mouth every other day.     . oxyCODONE (OXY IR/ROXICODONE) 5 MG  immediate release tablet Take 1 tablet (5 mg total) by mouth every 6 (six) hours as needed for severe pain. 15 tablet 0    Results for orders placed or performed during the hospital encounter of 10/11/18 (from the past 48 hour(s))  CBC WITH DIFFERENTIAL     Status: Abnormal   Collection Time: 10/11/18  6:23 AM  Result Value Ref Range   WBC 4.2 4.0 - 10.5 K/uL   RBC 3.57 (L) 3.87 - 5.11 MIL/uL   Hemoglobin 11.4 (L) 12.0 - 15.0 g/dL   HCT 36.3 36.0 - 46.0 %   MCV 101.7 (H) 80.0 - 100.0 fL   MCH 31.9 26.0 - 34.0 pg   MCHC 31.4 30.0 - 36.0 g/dL   RDW 13.0 11.5 - 15.5 %   Platelets 228 150 - 400 K/uL   nRBC 0.0 0.0 - 0.2 %   Neutrophils Relative % 54 %   Neutro Abs 2.3 1.7 - 7.7 K/uL   Lymphocytes Relative 30 %   Lymphs Abs 1.3 0.7 - 4.0 K/uL   Monocytes Relative 13 %   Monocytes Absolute 0.6 0.1 - 1.0 K/uL   Eosinophils Relative 2 %   Eosinophils Absolute 0.1 0.0 - 0.5 K/uL   Basophils Relative 1 %   Basophils Absolute 0.0 0.0 - 0.1 K/uL   Immature Granulocytes 0 %  Abs Immature Granulocytes 0.01 0.00 - 0.07 K/uL    Comment: Performed at [facility], [place] [address] [place], [place] [address]   No results found.  Review of Systems  All other systems reviewed and are negative.   Blood pressure (!) 158/85, pulse (!) 106, temperature 98.1 F (36.7 C), resp. rate 17, SpO2 99 %. Physical Exam  Constitutional: She appears well-developed.  HENT:  Head: Normocephalic.  Eyes: Pupils are equal, round, and reactive to light.  Neck: Normal range of motion.  Respiratory:  LEFT BREAST  AND AXILLA INCISIONS CDI  GI: Soft.     Assessment/Plan LEFT BREAST CANCER  NEEDS COMPLETION L  ALND lymph node dissection has been discussed with the patient.  Risk of bleeding,  Infection,  Seroma formation,  Additional procedures,,  Shoulder weakness ,  Shoulder stiffness,  Nerve and blood vessel injury and reaction to the mapping dyes have been discussed.  Alternatives to surgery have  been discussed with the patient.  The patient agrees to proceed.  Turner Daniels, MD 10/11/2018, 7:32 AM

## 2018-10-11 NOTE — Discharge Instructions (Signed)
Surgical [facility] Surgical drains are used to remove extra fluid that normally builds up in a surgical wound after surgery. A surgical drain helps to heal a surgical wound. Different kinds of surgical drains include:  Active drains. These drains use suction to pull drainage away from the surgical wound. Drainage flows through a tube to a container outside of the body. With these drains, you need to keep the bulb or the drainage container flat (compressed) at all times, except while you empty it. Flattening the bulb or container creates suction.  Passive drains. These drains allow fluid to drain naturally, by gravity. Drainage flows through a tube to a bandage (dressing) or a container outside of the body. Passive drains do not need to be emptied. A drain is placed during surgery. Right after surgery, drainage is usually bright red and a little thicker than water. The drainage may gradually turn yellow or pink and become thinner. It is likely that your health care provider will remove the drain when the drainage stops or when the amount decreases to 1-2 Tbsp (15-30 mL) during a 24-hour period. Supplies needed:  Tape.  Germ-free cleaning solution (sterile saline).  Cotton swabs.  Split gauze drain sponge: 4 x 4 inches (10 x 10 cm).  Gauze square: 4 x 4 inches (10 x 10 cm). How to care for your surgical drain Care for your drain as told by your health care provider. This is important to help prevent infection. If your drain is placed at your back, or any other hard-to-reach area, ask another person to assist you in performing the following tasks: General care  Keep the skin around the drain dry and covered with a dressing at all times.  Check your drain area every day for signs of infection. Check for: ? Redness, swelling, or pain. ? Pus or a bad smell. ? Cloudy drainage. ? Tenderness or pressure at the drain exit site. Changing the dressing Follow instructions from your health care  provider about how to change your dressing. Change your dressing at least once a day. Change it more often if needed to keep the dressing dry. Make sure you: 1. Gather your supplies. 2. Wash your hands with soap and water before you change your dressing. If soap and water are not available, use hand sanitizer. 3. Remove the old dressing. Avoid using scissors to do that. 4. Wash your hands with soap and water again after removing the old dressing. 5. Use sterile saline to clean your skin around the drain. You may need to use a cotton swab to clean the skin. 6. Place the tube through the slit in a drain sponge. Place the drain sponge so that it covers your wound. 7. Place the gauze square or another drain sponge on top of the drain sponge that is on the wound. Make sure the tube is between those layers. 8. Tape the dressing to your skin. 9. Tape the drainage tube to your skin 1-2 inches (2.5-5 cm) below the place where the tube enters your body. Taping keeps the tube from pulling on any stitches (sutures) that you have. 10. Wash your hands with soap and water. 11. Write down the color of your drainage and how often you change your dressing. How to empty your active drain  1. Make sure that you have a measuring cup that you can empty your drainage into. 2. Wash your hands with soap and water. If soap and water are not available, use hand sanitizer. 3.  Loosen any pins or clips that hold the tube in place. °4. If your health care provider tells you to strip the tube to prevent clots and tube blockages: °? Hold the tube at the skin with one hand. Use your other hand to pinch the tubing with your thumb and first finger. °? Gently move your fingers down the tube while squeezing very lightly. This clears any drainage, clots, or tissue from the tube. °? You may need to do this several times each day to keep the tube clear. Do not pull on the tube. °5. Open the bulb cap or the drain plug. Do not touch the  inside of the cap or the bottom of the plug. °6. Turn the device upside down and gently squeeze. °7. Empty all of the drainage into the measuring cup. °8. Compress the bulb or the container and replace the cap or the plug. To compress the bulb or the container, squeeze it firmly in the middle while you close the cap or plug the container. °9. Write down the amount of drainage that you have in each 24-hour period. If you have less than 2 Tbsp (30 mL) of drainage during 24 hours, contact your health care provider. °10. Flush the drainage down the toilet. °11. Wash your hands with soap and water. °Contact a health care provider if: °· You have redness, swelling, or pain around your drain area. °· You have pus or a bad smell coming from your drain area. °· You have a fever or chills. °· The skin around your drain is warm to the touch. °· The amount of drainage that you have is increasing instead of decreasing. °· You have drainage that is cloudy. °· There is a sudden stop or a sudden decrease in the amount of drainage that you have. °· Your drain tube falls out. °· Your active drain does not stay compressed after you empty it. °Summary °· Surgical drains are used to remove extra fluid that normally builds up in a surgical wound after surgery. °· Different kinds of surgical drains include active drains and passive drains. Active drains use suction to pull drainage away from the surgical wound, and passive drains allow fluid to drain naturally. °· It is important to care for your drain to prevent infection. If your drain is placed at your back, or any other hard-to-reach area, ask another person to assist you. °· Contact your health care provider if you have redness, swelling, or pain around your drain area. °This information is not intended to replace advice given to you by your health care provider. Make sure you discuss any questions you have with your health care provider. °Document Released: 02/07/2000 Document  Revised: 03/16/2018 Document Reviewed: 03/16/2018 °Elsevier Patient Education © 2020 [employer] ° °

## 2018-10-11 NOTE — Anesthesia Postprocedure Evaluation (Signed)
Anesthesia Post Note  Patient: Kathryn [name]  Procedure(s) Performed: LEFT AXILLARY LYMPH NODE DISSECTION (Left Axilla)     Patient location during evaluation: PACU Anesthesia Type: General and Regional Level of consciousness: awake and alert Pain management: pain level controlled Vital Signs Assessment: post-procedure vital signs reviewed and stable Respiratory status: spontaneous breathing, nonlabored ventilation and respiratory function stable Cardiovascular status: blood pressure returned to baseline and stable Postop Assessment: no apparent nausea or vomiting Anesthetic complications: no    Last Vitals:  Vitals:   10/11/18 0903 10/11/18 0915  BP: (!) 134/91 (!) 144/85  Pulse: (!) 109 (!) 101  Resp: 15 19  Temp:    SpO2: 93% 96%    Last Pain:  Vitals:   10/11/18 0654  PainSc: 2                  Kineta Fudala,W. EDMOND

## 2018-10-11 NOTE — Anesthesia Procedure Notes (Signed)
Anesthesia Regional Block: Pectoralis block   Pre-Anesthetic Checklist: ,, timeout performed, Correct Patient, Correct Site, Correct Laterality, Correct Procedure, Correct Position, site marked, Risks and benefits discussed, pre-op evaluation,  At surgeon's request and post-op pain management  Laterality: Left  Prep: Maximum Sterile Barrier Precautions used, chloraprep       Needles:  Injection technique: Single-shot  Needle Type: Echogenic Stimulator Needle     Needle Length: 9cm  Needle Gauge: 21     Additional Needles:   Procedures:,,,, ultrasound used (permanent image in chart),,,,  Narrative:  Start time: 10/11/2018 7:03 AM End time: 10/11/2018 7:13 AM Injection made incrementally with aspirations every 5 mL. Anesthesiologist: Roderic Palau, MD  Additional Notes: 2% Lidocaine skin wheel.

## 2018-10-11 NOTE — Anesthesia Procedure Notes (Signed)
Procedure Name: LMA Insertion Date/Time: 10/11/2018 7:35 AM Performed by: Lieutenant Diego, CRNA Pre-anesthesia Checklist: Patient identified, Emergency Drugs available, Suction available and Patient being monitored Patient Re-evaluated:Patient Re-evaluated prior to induction Oxygen Delivery Method: Circle system utilized Preoxygenation: Pre-oxygenation with 100% oxygen Induction Type: IV induction Ventilation: Mask ventilation without difficulty LMA: LMA inserted LMA Size: 4.0 Number of attempts: 1 Placement Confirmation: positive ETCO2 and breath sounds checked- equal and bilateral Tube secured with: Tape Dental Injury: Teeth and Oropharynx as per pre-operative assessment

## 2018-10-12 ENCOUNTER — Encounter (HOSPITAL_COMMUNITY): Payer: Self-pay | Admitting: Surgery

## 2018-10-14 ENCOUNTER — Other Ambulatory Visit (HOSPITAL_COMMUNITY): Payer: BC Managed Care – PPO

## 2018-10-17 ENCOUNTER — Other Ambulatory Visit: Payer: BC Managed Care – PPO

## 2018-10-18 ENCOUNTER — Ambulatory Visit (HOSPITAL_COMMUNITY): Payer: BC Managed Care – PPO

## 2018-10-19 NOTE — Progress Notes (Signed)
[place]  [phone] [phone]    ID: Kathryn [name] DOB: May 22, 1960  MR#: [id number]  BVQ#:[id number]  Patient Care Team: Kandace Blitz, MD as PCP - General (Obstetrics and Gynecology) Rockwell Germany, RN as Oncology Nurse Navigator Mauro Kaufmann, RN as Oncology Nurse Navigator Erroll Luna, MD as Consulting Physician (General Surgery) Caylon Saine, Virgie Dad, MD as Consulting Physician (Oncology) Kyung Rudd, MD as Consulting Physician (Radiation Oncology) OTHER MD: Rana Snare (OBGYN)   CHIEF COMPLAINT: Triple negative breast cancer  CURRENT TREATMENT: Neoadjuvant chemotherapy   INTERVAL HISTORY: [name] returns today for follow-up and treatment of her triple negative breast cancer.   Since her last visit, she underwent left breast lumpectomy on 09/23/2018. Pathology from the procedure ([id number]) showed: 1. Left Breast  - residual invasive ductal carcinoma, grade 2, 0.7 cm  - ductal carcinoma in situ, high grade  - margins greater than 0.2 cm  - lymphovascular invasion present  - prognostic indicators remain triple negative 2. Lymph node, one   - metastatic breast carcinoma (1/1)  - focus of carcinoma measures 0.7 cm  - no evidence of extranodal extension  She also underwent additional left axillary lymph node dissection on 10/11/2018. Pathology from the procedure ([id number]) showed: all seven lymph nodes were negative for carcinoma (0/7).   REVIEW OF SYSTEMS: Maurice did remarkably well with her surgery.  She had minimal pain, no bleeding, no fever.  She still has a drain in place.  They only put out 25 cc yesterday and she is hoping to be able to get it removed tomorrow when she sees Dr. Brantley Stage again.  She is hoping to be able to go to the beach for Labor Day.  Note her husband and mother-in-law both have had the coronavirus.  In both cases it was relatively mild, with fever and diarrhea as the primary symptoms.  They both have  recovered.  She is walking a mile a day and she is hoping soon to walk 2 miles daily.  A detailed review of systems today was otherwise noncontributory.  HISTORY OF CURRENT ILLNESS: From the original intake note:  Kathryn [name] presented with a palpable subareolar left breast lump, nipple retraction, and discharge for approximately 1 week. She underwent bilateral diagnostic mammography with tomography and left breast ultrasonography at [facility] on 04/01/2018 showing: Breast Density Category C. An irregular hyperdense mass is seen in the left subareolar region. An additional oval, circumscribed mass is seen in the inferior central aspect at posterior depth. No additional suspicious findings are identified within the remainder of the left breast. On physical exam, there is a 2-3 cm firm, fixed lump in the subareolar region on the left. Subtle skin changes and retraction is noted along the left nipple. Targeted ultrasound is performed, showing an irregular hypoechoic mass with associated vascularity at the 12 o'clock retroareolar position on the left. Overall measurements are 2.6 x 2.3 x 1.2 cm. This corresponds with the mammographic finding. Two adjacent circumscribed hypoechoic masses are identified in the deep 6 o'clock position 2 cm from the nipple. They measure 0.8 x 0.6 x 0.4 cm and 1.3 x 1.1 x 0.7 cm. There is no seated vascularity. This corresponds with the additional mammographic finding and likely represents minimally complicated cyst. Evaluation of the left axilla demonstrates a markedly enlarged abnormal lymph node with complete hilar replacement. It measures up to 5 cm in long axis dimension. No suspicious mammographic findings on the right.   Accordingly on 04/06/2018 she  proceeded to biopsy of the left breast area in question. The pathology from this procedure showed ([id number]): invasive ductal carcinoma, grade III, with lymphovascular invasion. Prognostic indicators significant for:  estrogen receptor, 0% negative and progesterone receptor, 0% negative. Proliferation marker Ki67 at 70%. HER2 equivocal (2+) by immunohistochemistry, but negative by FISH (print3ed report pending).  On the same day she underwent a biopsy of the left axillary lymph node on 04/06/2018 showing ([id number]): metastatic carcinoma in 1 of 1 lymph node (1/1).   The patient's subsequent history is as detailed below.   PAST MEDICAL HISTORY: Past Medical History:  Diagnosis Date   Breast cancer ([place])    stage 3 - left   Family history of kidney cancer    Family history of melanoma    Headache    hormone related, none since 1st child was born      PAST SURGICAL HISTORY: Past Surgical History:  Procedure Laterality Date   AXILLARY LYMPH NODE DISSECTION Left 10/11/2018   Procedure: LEFT AXILLARY LYMPH NODE DISSECTION;  Surgeon: Erroll Luna, MD;  Location: [facility];  Service: General;  Laterality: Left;   BREAST LUMPECTOMY WITH RADIOACTIVE SEED AND SENTINEL LYMPH NODE BIOPSY Left 09/23/2018   Procedure: LEFT BREAST LUMPECTOMY WITH RADIOACTIVE SEED AND LEFT AXILLARY TARGETED LYMPH NODE BIOPY AND  LEFT AXILLARY SENTINEL LYMPH NODE [place];  Surgeon: Erroll Luna, MD;  Location: [facility];  Service: General;  Laterality: Left;   CESAREAN SECTION     x3   PORTACATH PLACEMENT N/A 04/28/2018   Procedure: INSERTION PORT-A-CATH WITH ULTRASOUND;  Surgeon: Erroll Luna, MD;  Location: [facility];  Service: General;  Laterality: N/A;     FAMILY HISTORY: Family History  Problem Relation Age of Onset   Kidney cancer Sister 61       d. 20   Lung cancer Paternal Uncle    Melanoma Sister    Melanoma Sister    Haivyn's father died from congestive heart failure at age 83. Patients' mother is 37 as of 03/2018. The patient has 1 brother and 3 sisters. Patient denies anyone in her family having breast, ovarian, prostate, or pancreatic cancer. Kathryn's sister, Dyann [name], was diagnosed with  Kidney Cancer at 68. Marquetta has an uncle and a cousin that were diagnosed with lung cancer, but they were both heavy smokers.    GYNECOLOGIC HISTORY:  No LMP recorded. Patient is postmenopausal. 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 Her husband, Aisley Whan, owns [employer]. Together, they have three children, [name], Seagraves, and Brooks. Yazmyn Valbuena is 100, lives in [place], and works as a Theme park manager for the [employer]. Aide Wojnar is 75, lives in [place], and is a Equities trader at [employer]. Laekyn Rayos is 64, is a Ship broker, and lives at home with [name] and [name].    ADVANCED DIRECTIVES: Kellan's husband, Kierria Feigenbaum, is automatically her healthcare power of attorney.    HEALTH MAINTENANCE: Social History   Tobacco Use   Smoking status: Never Smoker   Smokeless tobacco: Never Used  Substance Use Topics   Alcohol use: Not Currently    Comment: socially   Drug use: Never    Colonoscopy: no  PAP: 2015  Bone density: no   No Known Allergies  Current  Outpatient Medications  Medication Sig Dispense Refill   capecitabine (XELODA) 500 MG tablet Take 2 tablets twice daily on radiation days 84 tablet 0   docusate sodium (COLACE) 50 MG capsule Take 50 mg by mouth daily.     ibuprofen (ADVIL) 800 MG tablet Take 1 tablet (800 mg total) by mouth every 8 (eight) hours as needed. (Patient taking differently: Take 800 mg by mouth every 8 (eight) hours as needed for headache or moderate pain. ) 30 tablet 0   ibuprofen (ADVIL) 800 MG tablet Take 1 tablet (800 mg total) by mouth every 8 (eight) hours as needed. 30 tablet 0   lidocaine-prilocaine (EMLA) cream Apply to affected area once (Patient taking  differently: Apply 1 application topically once. ) 30 g 3   loratadine (CLARITIN) 10 MG tablet Take 10 mg by mouth every other day.      oxyCODONE (OXY IR/ROXICODONE) 5 MG immediate release tablet Take 1 tablet (5 mg total) by mouth every 6 (six) hours as needed for severe pain. 15 tablet 0   oxyCODONE (OXY IR/ROXICODONE) 5 MG immediate release tablet Take 1 tablet (5 mg total) by mouth every 6 (six) hours as needed for severe pain. 15 tablet 0   No current facility-administered medications for this visit.      OBJECTIVE: Middle-aged white woman who appears stated age 56:   10/20/18 1620  BP: 133/86  Pulse: 100  Resp: 18  Temp: 98.3 F (36.8 C)  SpO2: 100%     Body mass index is 25.87 kg/m.   Wt Readings from Last 3 Encounters:  10/20/18 150 lb 11.2 oz (68.4 kg)  09/23/18 149 lb 14.6 oz (68 kg)  08/23/18 151 lb 3.2 oz (68.6 kg)  ECOG FS:1  Sclerae unicteric, EOMs intact Wearing a mask No cervical or supraclavicular adenopathy Lungs no rales or rhonchi Heart regular rate and rhythm Abd soft, nontender, positive bowel sounds MSK no focal spinal tenderness, no upper extremity lymphedema Neuro: nonfocal, well oriented, appropriate affect Breasts: The right breast is unremarkable.  The left breast is status post recent lumpectomy and reexcision.  The cosmetic result is excellent.  Incisions are healing well.  There is a drain in place with approximately 20 cc of serosanguineous fluid in the bulb.   LAB RESULTS:  CMP     Component Value Date/Time   NA 141 10/20/2018 1553   K 4.1 10/20/2018 1553   CL 108 10/20/2018 1553   CO2 23 10/20/2018 1553   GLUCOSE 89 10/20/2018 1553   BUN 13 10/20/2018 1553   CREATININE 0.79 10/20/2018 1553   CREATININE 0.62 08/02/2018 0834   CALCIUM 8.8 (L) 10/20/2018 1553   PROT 6.7 10/20/2018 1553   ALBUMIN 3.6 10/20/2018 1553   AST 23 10/20/2018 1553   AST 29 08/02/2018 0834   ALT 20 10/20/2018 1553   ALT 41 08/02/2018 0834    ALKPHOS 94 10/20/2018 1553   BILITOT 0.3 10/20/2018 1553   BILITOT 0.3 08/02/2018 0834   GFRNONAA >60 10/20/2018 1553   GFRNONAA >60 08/02/2018 0834   GFRAA >60 10/20/2018 1553   GFRAA >60 08/02/2018 0834    No results found for: TOTALPROTELP, ALBUMINELP, A1GS, A2GS, BETS, BETA2SER, GAMS, MSPIKE, SPEI  No results found for: KPAFRELGTCHN, LAMBDASER, KAPLAMBRATIO  Lab Results  Component Value Date   WBC 4.4 10/20/2018   NEUTROABS 2.4 10/20/2018   HGB 11.4 (L) 10/20/2018   HCT 35.3 (L) 10/20/2018   MCV 98.9 10/20/2018   PLT 220 10/20/2018    @  LASTCHEMISTRY@  No results found for: LABCA2  No components found for: QMVHQI696  No results for input(s): INR in the last 168 hours.  No results found for: LABCA2  No results found for: EXB284  No results found for: XLK440  No results found for: NUU725  No results found for: CA2729  No components found for: HGQUANT  No results found for: CEA1 / No results found for: CEA1   No results found for: AFPTUMOR  No results found for: CHROMOGRNA  No results found for: PSA1  Appointment on 10/20/2018  Component Date Value Ref Range Status   Sodium 10/20/2018 141  135 - 145 mmol/L Final   Potassium 10/20/2018 4.1  3.5 - 5.1 mmol/L Final   Chloride 10/20/2018 108  98 - 111 mmol/L Final   CO2 10/20/2018 23  22 - 32 mmol/L Final   Glucose, Bld 10/20/2018 89  70 - 99 mg/dL Final   BUN 10/20/2018 13  6 - 20 mg/dL Final   Creatinine, Ser 10/20/2018 0.79  0.44 - 1.00 mg/dL Final   Calcium 10/20/2018 8.8* 8.9 - 10.3 mg/dL Final   Total Protein 10/20/2018 6.7  6.5 - 8.1 g/dL Final   Albumin 10/20/2018 3.6  3.5 - 5.0 g/dL Final   AST 10/20/2018 23  15 - 41 U/L Final   ALT 10/20/2018 20  0 - 44 U/L Final   Alkaline Phosphatase 10/20/2018 94  38 - 126 U/L Final   Total Bilirubin 10/20/2018 0.3  0.3 - 1.2 mg/dL Final   GFR calc non Af Amer 10/20/2018 >60  >60 mL/min Final   GFR calc Af Amer 10/20/2018 >60  >60 mL/min  Final   Anion gap 10/20/2018 10  5 - 15 Final   Performed at [facility], [place] [address] [place], [place] [address]   WBC 10/20/2018 4.4  4.0 - 10.5 K/uL Final   RBC 10/20/2018 3.57* 3.87 - 5.11 MIL/uL Final   Hemoglobin 10/20/2018 11.4* 12.0 - 15.0 g/dL Final   HCT 10/20/2018 35.3* 36.0 - 46.0 % Final   MCV 10/20/2018 98.9  80.0 - 100.0 fL Final   MCH 10/20/2018 31.9  26.0 - 34.0 pg Final   MCHC 10/20/2018 32.3  30.0 - 36.0 g/dL Final   RDW 10/20/2018 12.8  11.5 - 15.5 % Final   Platelets 10/20/2018 220  150 - 400 K/uL Final   nRBC 10/20/2018 0.0  0.0 - 0.2 % Final   Neutrophils Relative % 10/20/2018 53  % Final   Neutro Abs 10/20/2018 2.4  1.7 - 7.7 K/uL Final   Lymphocytes Relative 10/20/2018 33  % Final   Lymphs Abs 10/20/2018 1.4  0.7 - 4.0 K/uL Final   Monocytes Relative 10/20/2018 11  % Final   Monocytes Absolute 10/20/2018 0.5  0.1 - 1.0 K/uL Final   Eosinophils Relative 10/20/2018 2  % Final   Eosinophils Absolute 10/20/2018 0.1  0.0 - 0.5 K/uL Final   Basophils Relative 10/20/2018 1  % Final   Basophils Absolute 10/20/2018 0.0  0.0 - 0.1 K/uL Final   Immature Granulocytes 10/20/2018 0  % Final   Abs Immature Granulocytes 10/20/2018 0.00  0.00 - 0.07 K/uL Final   Performed at [facility], [place] [address], [place], [place] [address]    (this displays the last labs from the last 3 days)  No results found for: TOTALPROTELP, ALBUMINELP, A1GS, A2GS, BETS, BETA2SER, GAMS, MSPIKE, SPEI (this displays SPEP labs)  No results found for:  KPAFRELGTCHN, LAMBDASER, KAPLAMBRATIO (kappa/lambda light chains)  No results found for: HGBA, HGBA2QUANT, HGBFQUANT, HGBSQUAN (Hemoglobinopathy evaluation)   No results found for: LDH  No results found for: IRON, TIBC, IRONPCTSAT (Iron and TIBC)  No results found for: FERRITIN  Urinalysis No results found for: COLORURINE, APPEARANCEUR, LABSPEC, PHURINE, GLUCOSEU,  HGBUR, BILIRUBINUR, KETONESUR, PROTEINUR, UROBILINOGEN, NITRITE, LEUKOCYTESUR   STUDIES:  Nm Sentinel Node Inj-no Rpt (breast)  Result Date: 09/23/2018 Sulfur colloid was injected by the nuclear medicine technologist for melanoma sentinel node.   Mm Breast Surgical Specimen  Result Date: 09/23/2018 CLINICAL DATA:  Patient with LEFT axillary metastasis status post removal after earlier radioactive seed localization. EXAM: SPECIMEN RADIOGRAPH OF THE LEFT BREAST COMPARISON:  Previous exam(s). FINDINGS: Status post excision of the LEFT axilla. The radioactive seed and biopsy marker clip are present, completely intact, and were marked for pathology. Findings discussed with the OR staff during the procedure. IMPRESSION: Specimen radiograph of the LEFT axilla. Electronically Signed   By: Franki Cabot M.D.   On: 09/23/2018 08:47   Mm Breast Surgical Specimen  Result Date: 09/23/2018 CLINICAL DATA:  Status post LEFT breast lumpectomy today after earlier radioactive seed localization. EXAM: SPECIMEN RADIOGRAPH OF THE LEFT BREAST COMPARISON:  Previous exam(s). FINDINGS: Status post excision of the left breast. The radioactive seed and biopsy marker clip are present, completely intact, and were marked for pathology. Findings discussed with the OR staff during the procedure. IMPRESSION: Specimen radiograph of the LEFT breast. Electronically Signed   By: Franki Cabot M.D.   On: 09/23/2018 08:27   Mm Lt Radioactive Seed Loc Mammo Guide  Result Date: 09/22/2018 CLINICAL DATA:  58 year old female with left breast cancer and left axillary metastases presents for radioactive seed localization of the left breast. EXAM: MAMMOGRAPHIC GUIDED RADIOACTIVE SEED LOCALIZATION OF THE LEFT BREAST COMPARISON:  Previous exam(s). FINDINGS: Patient presents for radioactive seed localization prior to left breast lumpectomy. I met with the patient and we discussed the procedure of seed localization including benefits and  alternatives. We discussed the high likelihood of a successful procedure. We discussed the risks of the procedure including infection, bleeding, tissue injury and further surgery. We discussed the low dose of radioactivity involved in the procedure. Informed, written consent was given. The usual time-out protocol was performed immediately prior to the procedure. Using mammographic guidance, sterile technique, 1% lidocaine and an I-125 radioactive seed, the ribbon shaped biopsy marking clip with associated mass was localized using a lateral to medial approach. The follow-up mammogram images confirm the seed in the expected location and were marked for Dr. Brantley Stage. Follow-up survey of the patient confirms presence of the radioactive seed. Order number of I-125 seed:  [id number]. Total activity:  7.510 millicuries reference Date: 09/21/2018 The patient tolerated the procedure well and was released from the [facility]. She was given instructions regarding seed removal. IMPRESSION: 1.  Radioactive seed localization left breast. 2.  Adequate positioning of radioactive seed in the left axilla. Electronically Signed   By: Everlean Alstrom M.D.   On: 09/22/2018 15:06   [place] Lt Radioactive Seed Loc  Result Date: 09/22/2018 CLINICAL DATA:  58 year old female with left breast cancer and left axillary metastasis post neoadjuvant chemotherapy presents for preoperative radioactive seed localization of a left axillary lymph node. EXAM: ULTRASOUND GUIDED RADIOACTIVE SEED LOCALIZATION OF THE LEFT AXILLA COMPARISON:  Previous exam(s). FINDINGS: Patient presents for radioactive seed localization prior to left breast lumpectomy. I met with the patient and we discussed the procedure of seed localization  including benefits and alternatives. We discussed the high likelihood of a successful procedure. We discussed the risks of the procedure including infection, bleeding, tissue injury and further surgery. We discussed the low dose of  radioactivity involved in the procedure. Informed, written consent was given. The usual time-out protocol was performed immediately prior to the procedure. Using ultrasound guidance, sterile technique, 1% lidocaine and an I-125 radioactive seed, the left axillary lymph node containing the HydroMARK biopsy marking clip was localized using a inferior to superior approach. The follow-up mammogram images confirm the seed in the expected location and were marked for Dr. Brantley Stage. Follow-up survey of the patient confirms presence of the radioactive seed. Order number of I-125 seed:  [id number]. Total activity:  7.035 millicuries reference Date: 09/02/2018 The patient tolerated the procedure well and was released from the [facility]. She was given instructions regarding seed removal. IMPRESSION: Radioactive seed localization left axilla. No apparent complications. Electronically Signed   By: Everlean Alstrom M.D.   On: 09/22/2018 15:02    ELIGIBLE FOR AVAILABLE RESEARCH PROTOCOL: [id number]   ASSESSMENT: 58 y.o. [place], [place] woman status post left breast upper outer quadrant biopsy 04/06/2018 for a clinical T2 N2, stage IIIC invasive ductal carcinoma, grade 3, triple negative, with an MIB-1 of 70%  (a) breast MRI 04/19/2018 shows a 5 cm central breast lesion with multiple satellite nodules and greater then 3 abnormal axillary lymph nodes  (b) baseline echocardiogram 04/25/2018 shows an ejection fraction in the 60-65% range  (c) chest CT scan and bone scan showed no metastatic disease.  Nonspecific rib changes will need to be followed  (1) neoadjuvant chemotherapy consisting of doxorubicin and cyclophosphamide given every 21 days x4, starting 05/03/2018, completed 06/28/2018, to be followed by weekly paclitaxel and carboplatin x12 starting 07/20/2018   (a) Doxorubicin and Cyclophosphamide dose reduced for cycles 3 and 4 due to neutropenia  (b) dose dense changed to every 3 weeks for doxorubicin/cyclophosphamide  secondary to cytopenias and side effects  (2) left lumpectomy targeted axillary lymph node sampling 09/23/2018 showed a residual ypT1b ypN1 invasive ductal carcinoma, grade 2, again triple negative  (a) completion axillary dissection 10/11/2018 showed an additional 7 lymph nodes all negative for carcinoma  (3) adjuvant radiation after surgery with capecitabine sensitization  (4) continue capecitabine at full dose to complete a total of 6 months  (5) genetics testing 04/27/2018: No pathogenic mutations.The Multi-Gene Panel offered by [facility] includes sequencing and/or deletion duplication testing of the following 85 genes: AIP, ALK, APC, ATM, AXIN2,BAP1,  BARD1, BLM, BMPR1A, BRCA1, BRCA2, BRIP1, CASR, CDC73, CDH1, CDK4, CDKN1B, CDKN1C, CDKN2A (p14ARF), CDKN2A (p16INK4a), CEBPA, CHEK2, CTNNA1, DICER1, DIS3L2, EGFR (c.2369C>T, p.Thr790Met variant only), EPCAM (Deletion/duplication testing only), FH, FLCN, GATA2, GPC3, GREM1 (Promoter region deletion/duplication testing only), HOXB13 (c.251G>A, p.Gly84Glu), HRAS, KIT, MAX, MEN1, MET, MITF (c.952G>A, p.Glu318Lys variant only), MLH1, MSH2, MSH3, MSH6, MUTYH, NBN, NF1, NF2, NTHL1, PALB2, PDGFRA, PHOX2B, PMS2, POLD1, POLE, POT1, PRKAR1A, PTCH1, PTEN, RAD50, RAD51C, RAD51D, RB1, RECQL4, RET, RNF43, RUNX1, SDHAF2, SDHA (sequence changes only), SDHB, SDHC, SDHD, SMAD4, SMARCA4, SMARCB1, SMARCE1, STK11, SUFU, TERC, TERT, TMEM127, TP53, TSC1, TSC2, VHL, WRN and WT1.   (6) consider [id number]  (7) Caris requested 10/20/2018  PLAN: Umi is recovering well from her surgery.  She is eager to get on with radiation.  She already has an appointment late September with radiation oncology but perhaps we can move that up a little for her.  Certainly with triple negative tumors we would like to start radiation as soon as  possible  I think she will benefit from 6 months of capecitabine adjuvantly.  We discussed the possible toxicities side effects and complications today.  She  will start this together with radiation and receive sensitizing doses only.  After radiation she will continue full dose until she completes a total of 6 months.  She will then be eligible for [phone].  We are also checking a Caris panel to see if she might be eligible for other studies.  She loaned me a very nice book about 2 aviator's.  I had it in my room to return to her and now I cannot find it.  She will see me again in approximately a month or in any case before she start radiation so we can discuss capecitabine in more detail.  Chauncey Cruel, MD Medical Oncology and Hematology [facility] [address] [place], [place] [address] Tel. [phone]    Fax. [phone]   I, Wilburn Mylar, am acting as scribe for Dr. Virgie Dad. Bret Vanessen.  I, Lurline Del MD, have reviewed the above documentation for accuracy and completeness, and I agree with the above.

## 2018-10-20 ENCOUNTER — Inpatient Hospital Stay (HOSPITAL_BASED_OUTPATIENT_CLINIC_OR_DEPARTMENT_OTHER): Payer: BC Managed Care – PPO | Admitting: Oncology

## 2018-10-20 ENCOUNTER — Inpatient Hospital Stay: Payer: BC Managed Care – PPO | Attending: Oncology

## 2018-10-20 ENCOUNTER — Other Ambulatory Visit: Payer: Self-pay

## 2018-10-20 VITALS — BP 133/86 | HR 100 | Temp 98.3°F | Resp 18 | Ht 64.0 in | Wt 150.7 lb

## 2018-10-20 DIAGNOSIS — Z923 Personal history of irradiation: Secondary | ICD-10-CM | POA: Diagnosis not present

## 2018-10-20 DIAGNOSIS — Z9221 Personal history of antineoplastic chemotherapy: Secondary | ICD-10-CM | POA: Insufficient documentation

## 2018-10-20 DIAGNOSIS — C50412 Malignant neoplasm of upper-outer quadrant of left female breast: Secondary | ICD-10-CM

## 2018-10-20 DIAGNOSIS — Z807 Family history of other malignant neoplasms of lymphoid, hematopoietic and related tissues: Secondary | ICD-10-CM | POA: Diagnosis not present

## 2018-10-20 DIAGNOSIS — Z801 Family history of malignant neoplasm of trachea, bronchus and lung: Secondary | ICD-10-CM | POA: Insufficient documentation

## 2018-10-20 DIAGNOSIS — C773 Secondary and unspecified malignant neoplasm of axilla and upper limb lymph nodes: Secondary | ICD-10-CM | POA: Insufficient documentation

## 2018-10-20 DIAGNOSIS — Z780 Asymptomatic menopausal state: Secondary | ICD-10-CM | POA: Insufficient documentation

## 2018-10-20 DIAGNOSIS — Z171 Estrogen receptor negative status [ER-]: Secondary | ICD-10-CM

## 2018-10-20 DIAGNOSIS — Z8051 Family history of malignant neoplasm of kidney: Secondary | ICD-10-CM | POA: Diagnosis not present

## 2018-10-20 DIAGNOSIS — Z791 Long term (current) use of non-steroidal anti-inflammatories (NSAID): Secondary | ICD-10-CM | POA: Insufficient documentation

## 2018-10-20 DIAGNOSIS — Z79899 Other long term (current) drug therapy: Secondary | ICD-10-CM | POA: Diagnosis not present

## 2018-10-20 LAB — COMPREHENSIVE METABOLIC PANEL
ALT: 20 U/L (ref 0–44)
AST: 23 U/L (ref 15–41)
Albumin: 3.6 g/dL (ref 3.5–5.0)
Alkaline Phosphatase: 94 U/L (ref 38–126)
Anion gap: 10 (ref 5–15)
BUN: 13 mg/dL (ref 6–20)
CO2: 23 mmol/L (ref 22–32)
Calcium: 8.8 mg/dL — ABNORMAL LOW (ref 8.9–10.3)
Chloride: 108 mmol/L (ref 98–111)
Creatinine, Ser: 0.79 mg/dL (ref 0.44–1.00)
GFR calc Af Amer: >60 mL/min (ref >60)
GFR calc non Af Amer: >60 mL/min (ref >60)
Glucose, Bld: 89 mg/dL (ref 70–99)
Potassium: 4.1 mmol/L (ref 3.5–5.1)
Sodium: 141 mmol/L (ref 135–145)
Total Bilirubin: 0.3 mg/dL (ref 0.3–1.2)
Total Protein: 6.7 g/dL (ref 6.5–8.1)

## 2018-10-20 LAB — CBC WITH DIFFERENTIAL/PLATELET
Abs Immature Granulocytes: 0 10*3/uL (ref 0.00–0.07)
Basophils Absolute: 0 10*3/uL (ref 0.0–0.1)
Basophils Relative: 1 %
Eosinophils Absolute: 0.1 10*3/uL (ref 0.0–0.5)
Eosinophils Relative: 2 %
HCT: 35.3 % — ABNORMAL LOW (ref 36.0–46.0)
Hemoglobin: 11.4 g/dL — ABNORMAL LOW (ref 12.0–15.0)
Immature Granulocytes: 0 %
Lymphocytes Relative: 33 %
Lymphs Abs: 1.4 10*3/uL (ref 0.7–4.0)
MCH: 31.9 pg (ref 26.0–34.0)
MCHC: 32.3 g/dL (ref 30.0–36.0)
MCV: 98.9 fL (ref 80.0–100.0)
Monocytes Absolute: 0.5 10*3/uL (ref 0.1–1.0)
Monocytes Relative: 11 %
Neutro Abs: 2.4 10*3/uL (ref 1.7–7.7)
Neutrophils Relative %: 53 %
Platelets: 220 10*3/uL (ref 150–400)
RBC: 3.57 MIL/uL — ABNORMAL LOW (ref 3.87–5.11)
RDW: 12.8 % (ref 11.5–15.5)
WBC: 4.4 10*3/uL (ref 4.0–10.5)
nRBC: 0 % (ref 0.0–0.2)

## 2018-10-20 MED ORDER — CAPECITABINE 500 MG PO TABS: ORAL_TABLET | ORAL | 0 refills | Status: DC

## 2018-10-21 ENCOUNTER — Encounter: Payer: Self-pay | Admitting: Oncology

## 2018-10-21 ENCOUNTER — Telehealth: Payer: Self-pay | Admitting: Oncology

## 2018-10-21 NOTE — Telephone Encounter (Signed)
I talk with patient regarding schedule  

## 2018-10-22 ENCOUNTER — Other Ambulatory Visit (HOSPITAL_COMMUNITY): Payer: BC Managed Care – PPO

## 2018-10-24 ENCOUNTER — Encounter (HOSPITAL_COMMUNITY): Payer: BC Managed Care – PPO

## 2018-10-24 ENCOUNTER — Telehealth: Payer: Self-pay | Admitting: *Deleted

## 2018-10-24 ENCOUNTER — Other Ambulatory Visit: Payer: Self-pay | Admitting: *Deleted

## 2018-10-24 ENCOUNTER — Inpatient Hospital Stay: Payer: BC Managed Care – PPO | Attending: Oncology | Admitting: Oncology

## 2018-10-24 MED ORDER — CAPECITABINE 500 MG PO TABS: ORAL_TABLET | ORAL | 0 refills | Status: DC

## 2018-10-24 NOTE — Telephone Encounter (Signed)
Request for Caris study faxed today.

## 2018-10-25 ENCOUNTER — Telehealth: Payer: Self-pay | Admitting: Pharmacist

## 2018-10-25 ENCOUNTER — Telehealth: Payer: Self-pay

## 2018-10-25 DIAGNOSIS — C50412 Malignant neoplasm of upper-outer quadrant of left female breast: Secondary | ICD-10-CM

## 2018-10-25 MED ORDER — CAPECITABINE 500 MG PO TABS: 1000.0000 mg | ORAL_TABLET | Freq: Two times a day (BID) | ORAL | 0 refills | Status: DC

## 2018-10-25 NOTE — Telephone Encounter (Signed)
Oral Oncology Patient Advocate Encounter  Prior Authorization for Xeloda has been approved.    PA# [id number] Effective dates: 10/25/18 through 10/24/19  Oral Oncology Clinic will continue to follow.   [place] Patient [place] Phone [phone] Fax [phone] 10/25/2018    3:54 PM

## 2018-10-25 NOTE — Telephone Encounter (Signed)
Oral Oncology Patient Advocate Encounter  Received notification from [facility] that prior authorization for Xeloda is required.  PA submitted on CoverMyMeds Key [id number] Status is pending  Oral Oncology Clinic will continue to follow.  [place] Patient [place] Phone [phone] Fax [phone] 10/25/2018    11:46 AM

## 2018-10-25 NOTE — Telephone Encounter (Signed)
Oral Oncology Pharmacist Encounter  Received new prescription for Xeloda (capecitabine) for the adjuvant treatment of stage IIIC triple-negative breast cancer in conjunction with radiation for ~ 6 week course. Patient is then planned to continue adjuvant Xeloda as monotherapy for 6 months of total Xeloda.  Original diagnosis in early 2020 Patient received neoadjuvant treatment with doxorubicin and cyclophosphamide x 4 cycles (05/03/18-06/28/18), followed by paclitaxel and carboplatin x 4 cycles (07/20/18-08/09/18) Lumpectomy was performed 09/23/18, axillary lymph node dissection 10/11/18  Patient is now under evaluation to start adjuvant chest wall radiation with capecitabine sensitization at ~568 mg/m2 (1000 mg) by mouth 2 times daily on radiation days. Capecitabine will increase after completion of radiation to ~850 mg/m2 (1500 mg) by mouth 2 times daily for 14 days on, 7 days off, repeated every 21 days to complete her planned capecitabine course -- this prescription will be written once radiation is completed  Concurrent chemoradiation start date will be set after radiation consult appointment and CT simulation, which are not scheduled until the end of September 2020  Labs from 10/20/18 assessed, OK for treatment initiation. Labs are planned to be repeated in late Sept  Current medication list in Epic reviewed, no DDIs with capecitabine identified.  Prescription has been e-scribed to the [facility] for benefits analysis and approval.  Oral Oncology Clinic will continue to follow for insurance authorization, copayment issues, initial counseling and start date.  Johny Drilling, PharmD, BCPS, BCOP  10/25/2018 9:16 AM Oral Oncology Clinic [phone]

## 2018-10-27 NOTE — Telephone Encounter (Signed)
Oral Oncology Pharmacist Encounter  I spoke with patient today to provide update on capecitabine acquisition and plan moving forward. Patient informed insurance authorization for capecitabine has been approved and test claim at the pharmacy revealed copayment of $0 I will plan to follow-up with patient closer to the start of radiation to discuss initial counseling and to work out the details of medication acquisition. Patient states she has follow-up with her surgeon on 11/11/18 for clearance for radiation. Radiation consult appointment is planned for 11/16/18 I will check in on patient the week of 9/21 to coordinate the above.  I provided patient with the direct dial to oral oncology clinica. All questions answered. She knows to call the office with any additional questions or concerns.  Johny Drilling, PharmD, BCPS, BCOP  10/27/2018 11:24 AM Oral Oncology Clinic [phone]

## 2018-10-28 ENCOUNTER — Encounter: Payer: Self-pay | Admitting: Oncology

## 2018-11-09 ENCOUNTER — Telehealth: Payer: Self-pay | Admitting: Adult Health

## 2018-11-09 NOTE — Telephone Encounter (Signed)
[place] PAL 9/28. Moved 9/28 appointments to 9/30. Confirmed with patient.

## 2018-11-11 ENCOUNTER — Encounter: Payer: Self-pay | Admitting: Adult Health

## 2018-11-16 ENCOUNTER — Other Ambulatory Visit: Payer: Self-pay

## 2018-11-16 ENCOUNTER — Ambulatory Visit
Admission: RE | Admit: 2018-11-16 | Discharge: 2018-11-16 | Disposition: A | Discharge status: Home / Self Care | Payer: BC Managed Care – PPO | Source: Ambulatory Visit | Attending: Radiation Oncology | Admitting: Radiation Oncology

## 2018-11-16 DIAGNOSIS — C50412 Malignant neoplasm of upper-outer quadrant of left female breast: Secondary | ICD-10-CM

## 2018-11-16 NOTE — Progress Notes (Signed)
Radiation Oncology         [phone] ________________________________  Outpatient Follow Up - Conducted via telephone due to current COVID-19 concerns for limiting patient exposure  I spoke with the patient to conduct this consult visit via telephone to spare the patient unnecessary potential exposure in the healthcare setting during the current COVID-19 pandemic. The patient was notified in advance and was offered a [place] meeting to allow for face to face communication but unfortunately reported that they did not have the appropriate resources/technology to support such a visit and instead preferred to proceed with a telephone visit.  ________________________________  Name: Kathryn [name]        MRN: [id number]  Date of Service: 11/16/2018 DOB: 1961/01/19  CC:Kandace Blitz, MD  Magrinat, Virgie Dad, MD     REFERRING PHYSICIAN: Magrinat, Virgie Dad, MD   DIAGNOSIS: The encounter diagnosis was Malignant neoplasm of upper-outer quadrant of left breast in female, estrogen receptor negative ([place]).   HISTORY OF PRESENT ILLNESS: Kathryn [name] is a 58 y.o. female originally seen in the multidisciplinary breast clinic for a new diagnosis of left breast cancer. The patient was noted to have a palpable mass with nipple retraction, and nipple discharge for about 1 week. She had diagnostic imaging that revealed a mass at 12:00 measuring 2.6 x 2.3 x 1.2 cm and one enlarged axillary node. She underwent a biopsy on 04/06/2018 that revealed a grade 3 invasive ductal carcinoma with LVSI, and her node was also involved with carcinoma. Her tumor was triple negative with a Ki 67 of 70%. She began neoadjuvant chemotherapy between 05/03/2018-08/03/2018 and had improvement on post treatment imaging. She underwent lumpectomy and targeted node dissection on 09/23/2018, and this revealed a 7 mm residual invasive ductal carcinoma, with LVSI, and DCIS. Her margins were negative, and 1/1 nodes were positive. She underwent  right axillary dissection on 10/11/2018 and this revealed 0/7 nodes involved. She is planning on Xeloda as well. She is still healing and is contacted by telephone to discuss her treatment.    PREVIOUS RADIATION THERAPY: No   PAST MEDICAL HISTORY:  Past Medical History:  Diagnosis Date   Breast cancer ([place])    stage 3 - left   Family history of kidney cancer    Family history of melanoma    Headache    hormone related, none since 1st child was born        PAST SURGICAL HISTORY:  Past Surgical History:  Procedure Laterality Date   AXILLARY LYMPH NODE DISSECTION Left 10/11/2018   Procedure: LEFT AXILLARY LYMPH NODE DISSECTION;  Surgeon: Erroll Luna, MD;  Location: [place];  Service: General;  Laterality: Left;   BREAST LUMPECTOMY WITH RADIOACTIVE SEED AND SENTINEL LYMPH NODE BIOPSY Left 09/23/2018   Procedure: LEFT BREAST LUMPECTOMY WITH RADIOACTIVE SEED AND LEFT AXILLARY TARGETED LYMPH NODE BIOPY AND  LEFT AXILLARY SENTINEL LYMPH NODE [place];  Surgeon: Erroll Luna, MD;  Location: [place];  Service: General;  Laterality: Left;   CESAREAN SECTION     x3   PORTACATH PLACEMENT N/A 04/28/2018   Procedure: INSERTION PORT-A-CATH WITH ULTRASOUND;  Surgeon: Erroll Luna, MD;  Location: [place];  Service: General;  Laterality: N/A;    FAMILY HISTORY:  Family History  Problem Relation Age of Onset   Kidney cancer Sister 68       d. 82   Lung cancer Paternal Uncle    Melanoma Sister    Melanoma Sister  SOCIAL HISTORY:  reports that she has never smoked. She has never used smokeless tobacco. She reports previous alcohol use. She reports that she does not use drugs. The patient is married and lives in [place]. She has three adult daughters.    ALLERGIES: Patient has no known allergies.   MEDICATIONS:  Current Outpatient Medications  Medication Sig Dispense Refill   docusate sodium (COLACE) 50 MG capsule Take 50 mg by mouth daily.      loratadine (CLARITIN) 10 MG tablet Take 10 mg by mouth every other day.      capecitabine (XELODA) 500 MG tablet Take 2 tablets (1,000 mg total) by mouth 2 (two) times daily after a meal. Take on days of radiation only, M-F (Patient not taking: Reported on 11/16/2018) 132 tablet 0   ibuprofen (ADVIL) 800 MG tablet Take 1 tablet (800 mg total) by mouth every 8 (eight) hours as needed. (Patient not taking: Reported on 11/16/2018) 30 tablet 0   ibuprofen (ADVIL) 800 MG tablet Take 1 tablet (800 mg total) by mouth every 8 (eight) hours as needed. (Patient not taking: Reported on 11/16/2018) 30 tablet 0   lidocaine-prilocaine (EMLA) cream Apply to affected area once (Patient not taking: Reported on 11/16/2018) 30 g 3   oxyCODONE (OXY IR/ROXICODONE) 5 MG immediate release tablet Take 1 tablet (5 mg total) by mouth every 6 (six) hours as needed for severe pain. (Patient not taking: Reported on 11/16/2018) 15 tablet 0   oxyCODONE (OXY IR/ROXICODONE) 5 MG immediate release tablet Take 1 tablet (5 mg total) by mouth every 6 (six) hours as needed for severe pain. (Patient not taking: Reported on 11/16/2018) 15 tablet 0   No current facility-administered medications for this encounter.      REVIEW OF SYSTEMS: On review of systems, the patient reports that she is doing well overall. She denies any chest pain, shortness of breath, cough, fevers, chills, night sweats, unintended weight changes. She denies any bowel or bladder disturbances, and denies abdominal pain, nausea or vomiting. She denies any new musculoskeletal or joint aches or pains, new skin lesions or concerns. A complete review of systems is obtained and is otherwise negative.   PHYSICAL EXAM:  Unable to assess due to encounter type.  ECOG = 0  0 - Asymptomatic (Fully active, able to carry on all predisease activities without restriction)  1 - Symptomatic but completely ambulatory (Restricted in physically strenuous activity but ambulatory and  able to carry out work of a light or sedentary nature. For example, light housework, office work)  2 - Symptomatic, <50% in bed during the day (Ambulatory and capable of all self care but unable to carry out any work activities. Up and about more than 50% of waking hours)  3 - Symptomatic, >50% in bed, but not bedbound (Capable of only limited self-care, confined to bed or chair 50% or more of waking hours)  4 - Bedbound (Completely disabled. Cannot carry on any self-care. Totally confined to bed or chair)  5 - Death   Eustace Pen MM, Creech RH, Tormey DC, et al. [phone]). "Toxicity and response criteria of the [facility]". [place] Oncol. 5 (6): 649-55    LABORATORY DATA:  Lab Results  Component Value Date   WBC 4.4 10/20/2018   HGB 11.4 (L) 10/20/2018   HCT 35.3 (L) 10/20/2018   MCV 98.9 10/20/2018   PLT 220 10/20/2018   Lab Results  Component Value Date   NA 141 10/20/2018   K  4.1 10/20/2018   CL 108 10/20/2018   CO2 23 10/20/2018   Lab Results  Component Value Date   ALT 20 10/20/2018   AST 23 10/20/2018   ALKPHOS 94 10/20/2018   BILITOT 0.3 10/20/2018        IMPRESSION/PLAN: 1. Stage IIIB, cT2N1M0 grade 3 triple negative invasive ductal carcinoma of the left breast. Dr. Lisbeth Renshaw discusses the final pathology findings and reviews the nature of triple negative left breast disease. She would benefit from adjuvant radiotherapy to reduce the risk of local recurence. We discussed the risks, benefits, short, and long term effects of radiotherapy, and the patient is interested in proceeding. Dr. Lisbeth Renshaw discusses the delivery and logistics of radiotherapy and anticipates a course of 6 1/2 weeks of radiotherapy with deep inspiration breath hold technique. She is also planning to receive adjuvant Xeloda along with radiotherapy per Dr. Jana Hakim. 2. Persistent postoperative healing. Dr. Brantley Stage has asked that we hold off on treatment starting for at least 3-4 more  weeks and he will see her on 12/09/2018. We will proceed with simulation that day as long as he feels she's well healed.  Given current concerns for patient exposure during the COVID-19 pandemic, this encounter was conducted via telephone.  The patient has given verbal consent for this type of encounter. The time spent during this encounter was 30  minutes and 50% of that time was spent in the coordination of her care. The attendants for this meeting include Shona Simpson, [facility] and [name] and Eulis Manly  During the encounter, Dr. Lisbeth Renshaw and Shona Simpson [facility] were located at [facility] Radiation Oncology Department.  [name]  and her husband were  located at her husband's office.   The above documentation reflects my direct findings during this shared patient visit. Please see the separate note by Dr. Lisbeth Renshaw on this date for the remainder of the patient's plan of care.    Carola Rhine, PAC

## 2018-11-18 ENCOUNTER — Other Ambulatory Visit: Payer: Self-pay | Admitting: Oncology

## 2018-11-18 ENCOUNTER — Encounter: Payer: Self-pay | Admitting: *Deleted

## 2018-11-21 ENCOUNTER — Other Ambulatory Visit: Payer: BC Managed Care – PPO

## 2018-11-21 ENCOUNTER — Ambulatory Visit: Payer: BC Managed Care – PPO | Admitting: Adult Health

## 2018-11-22 NOTE — Progress Notes (Signed)
[facility]  [phone] Fax:([phone]) [phone]    ID: Kathryn [name] DOB: 01-13-1961  MR#: [id number]  DJM#:[id number]  Patient Care Team: Kandace Blitz, MD as PCP - General (Obstetrics and Gynecology) Rockwell Germany, RN as Oncology Nurse Navigator Mauro Kaufmann, RN as Oncology Nurse Navigator Erroll Luna, MD as Consulting Physician (General Surgery) Magrinat, Virgie Dad, MD as Consulting Physician (Oncology) Kyung Rudd, MD as Consulting Physician (Radiation Oncology) OTHER MD: Rana Snare (OBGYN)   CHIEF COMPLAINT: Triple negative breast cancer  CURRENT TREATMENT: Neoadjuvant chemotherapy   INTERVAL HISTORY: Kathryn [name] returns today for follow-up and treatment of her triple negative breast cancer.   Since her last visit, she underwent left breast lumpectomy on 09/23/2018. Pathology from the procedure ([id number]) showed: 1. Left Breast  - residual invasive ductal carcinoma, grade 2, 0.7 cm  - ductal carcinoma in situ, high grade  - margins greater than 0.2 cm  - lymphovascular invasion present  - prognostic indicators remain triple negative 2. Lymph node, one   - metastatic breast carcinoma (1/1)  - focus of carcinoma measures 0.7 cm  - no evidence of extranodal extension  She also underwent additional left axillary lymph node dissection on 10/11/2018. Pathology from the procedure ([id number]) showed: all seven lymph nodes were negative for carcinoma (0/7).   REVIEW OF SYSTEMS: Kathryn [name] is feeling well today.  She continues to have delayed healing at her left lumpectomy site.  She notes that it is scabbed over and has stopped draining.  She is hoping to be improved enough to start radiation on 10/21.  She notes she has received a call from our oral oncology pharmacist about starting capecitabine on 10/21.  She wants to know if she needs to do anything in the meantime.   Otherwise, Kathryn [name] is doing well.  She denies any fever or chills.  She is without chest  pain, palpitations, cough, shortness of breath.  She has no bowel/bladder changes, nausea, or vomiting.  A detailed ROS was otherwise non contributory.    HISTORY OF CURRENT ILLNESS: From the original intake note:  Kathryn [name] presented with a palpable subareolar left breast lump, nipple retraction, and discharge for approximately 1 week. She underwent bilateral diagnostic mammography with tomography and left breast ultrasonography at [facility] on 04/01/2018 showing: Breast Density Category C. An irregular hyperdense mass is seen in the left subareolar region. An additional oval, circumscribed mass is seen in the inferior central aspect at posterior depth. No additional suspicious findings are identified within the remainder of the left breast. On physical exam, there is a 2-3 cm firm, fixed lump in the subareolar region on the left. Subtle skin changes and retraction is noted along the left nipple. Targeted ultrasound is performed, showing an irregular hypoechoic mass with associated vascularity at the 12 o'clock retroareolar position on the left. Overall measurements are 2.6 x 2.3 x 1.2 cm. This corresponds with the mammographic finding. Two adjacent circumscribed hypoechoic masses are identified in the deep 6 o'clock position 2 cm from the nipple. They measure 0.8 x 0.6 x 0.4 cm and 1.3 x 1.1 x 0.7 cm. There is no seated vascularity. This corresponds with the additional mammographic finding and likely represents minimally complicated cyst. Evaluation of the left axilla demonstrates a markedly enlarged abnormal lymph node with complete hilar replacement. It measures up to 5 cm in long axis dimension. No suspicious mammographic findings on the right.   Accordingly on 04/06/2018 she proceeded to biopsy of the left breast  area in question. The pathology from this procedure showed ([id number]): invasive ductal carcinoma, grade III, with lymphovascular invasion. Prognostic indicators significant for:  estrogen receptor, 0% negative and progesterone receptor, 0% negative. Proliferation marker Ki67 at 70%. HER2 equivocal (2+) by immunohistochemistry, but negative by FISH (print3ed report pending).  On the same day she underwent a biopsy of the left axillary lymph node on 04/06/2018 showing ([id number]): metastatic carcinoma in 1 of 1 lymph node (1/1).   The patient's subsequent history is as detailed below.   PAST MEDICAL HISTORY: Past Medical History:  Diagnosis Date  . Breast cancer ([facility])    stage 3 - left  . Family history of kidney cancer   . Family history of melanoma   . Headache    hormone related, none since 1st child was born      PAST SURGICAL HISTORY: Past Surgical History:  Procedure Laterality Date  . AXILLARY LYMPH NODE DISSECTION Left 10/11/2018   Procedure: LEFT AXILLARY LYMPH NODE DISSECTION;  Surgeon: Erroll Luna, MD;  Location: [facility];  Service: General;  Laterality: Left;  . BREAST LUMPECTOMY WITH RADIOACTIVE SEED AND SENTINEL LYMPH NODE BIOPSY Left 09/23/2018   Procedure: LEFT BREAST LUMPECTOMY WITH RADIOACTIVE SEED AND LEFT AXILLARY TARGETED LYMPH NODE BIOPY AND  LEFT AXILLARY SENTINEL LYMPH NODE MAPPING;  Surgeon: Erroll Luna, MD;  Location: [facility];  Service: General;  Laterality: Left;  . CESAREAN SECTION     x3  . PORTACATH PLACEMENT N/A 04/28/2018   Procedure: INSERTION PORT-A-CATH WITH ULTRASOUND;  Surgeon: Erroll Luna, MD;  Location: [facility];  Service: General;  Laterality: N/A;     FAMILY HISTORY: Family History  Problem Relation Age of Onset  . Kidney cancer Sister 78       d. 51  . Lung cancer Paternal Uncle   . Melanoma Sister   . Melanoma Sister    Kathryn [name]'s father died from congestive heart failure at age 42. Patients' mother is 64 as of 03/2018. The patient has 1 brother and 3 sisters. Patient denies anyone in her family having breast, ovarian, prostate, or pancreatic cancer. [name]'s sister, Kathryn [name], was diagnosed with  Kidney Cancer at 40. [name] has an uncle and a cousin that were diagnosed with lung cancer, but they were both heavy smokers.    GYNECOLOGIC HISTORY:  No LMP recorded. Patient is postmenopausal. 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 Her husband, Desirey Keahey, owns [employer]. Together, they have three children, [name], Learned, and Edmonson. Anikah Hogge is 41, lives in [place], and works as a Theme park manager for the [employer]. Airyana Sprunger is 80, lives in [place], and is a Equities trader at [employer]. Katrianna Friesenhahn is 46, is a Ship broker, and lives at home with [name] and [name].    ADVANCED DIRECTIVES: Jhade's husband, Al Gagen, is automatically her healthcare power of attorney.    HEALTH MAINTENANCE: Social History   Tobacco Use  . Smoking status: Never Smoker  . Smokeless tobacco: Never Used  Substance Use Topics  . Alcohol use: Not Currently    Comment: socially  . Drug use: Never    Colonoscopy: no  PAP: 2015  Bone density: no   No Known Allergies  Current Outpatient Medications  Medication Sig Dispense Refill  .  docusate sodium (COLACE) 50 MG capsule Take 50 mg by mouth daily.    Marland Kitchen loratadine (CLARITIN) 10 MG tablet Take 10 mg by mouth every other day.     . oxyCODONE (OXY IR/ROXICODONE) 5 MG immediate release tablet Take 1 tablet (5 mg total) by mouth every 6 (six) hours as needed for severe pain. 15 tablet 0  . capecitabine (XELODA) 500 MG tablet Take 2 tablets (1,000 mg total) by mouth 2 (two) times daily after a meal. Take on days of radiation only, M-F (Patient not taking: Reported on 11/16/2018) 132 tablet 0  . ibuprofen (ADVIL) 800 MG tablet Take 1 tablet (800 mg total) by mouth every 8 (eight)  hours as needed. (Patient not taking: Reported on 11/16/2018) 30 tablet 0  . ibuprofen (ADVIL) 800 MG tablet Take 1 tablet (800 mg total) by mouth every 8 (eight) hours as needed. (Patient not taking: Reported on 11/16/2018) 30 tablet 0  . lidocaine-prilocaine (EMLA) cream Apply to affected area once (Patient not taking: Reported on 11/16/2018) 30 g 3  . oxyCODONE (OXY IR/ROXICODONE) 5 MG immediate release tablet Take 1 tablet (5 mg total) by mouth every 6 (six) hours as needed for severe pain. (Patient not taking: Reported on 11/16/2018) 15 tablet 0   No current facility-administered medications for this visit.      OBJECTIVE:  Vitals:   11/23/18 1154  BP: 101/70  Pulse: (!) 105  Resp: 18  Temp: 98.3 F (36.8 C)     Body mass index is 26.14 kg/m.   Wt Readings from Last 3 Encounters:  11/23/18 152 lb 4.8 oz (69.1 kg)  10/20/18 150 lb 11.2 oz (68.4 kg)  09/23/18 149 lb 14.6 oz (68 kg)  ECOG FS:1  GENERAL: Patient is a well appearing female in no acute distress HEENT:  Sclerae anicteric.  Oropharynx clear and moist. No ulcerations or evidence of oropharyngeal candidiasis. Neck is supple.  NODES:  No cervical, supraclavicular, or axillary lymphadenopathy palpated.  BREAST EXAM:  Left lumpectomy site continuing to heal.  Scabbed over, no drainage noted.   LUNGS:  Clear to auscultation bilaterally.  No wheezes or rhonchi. HEART:  Regular rate and rhythm. No murmur appreciated. ABDOMEN:  Soft, nontender.  Positive, normoactive bowel sounds. No organomegaly palpated. MSK:  No focal spinal tenderness to palpation. Full range of motion bilaterally in the upper extremities. EXTREMITIES:  No peripheral edema.   SKIN:  Clear with no obvious rashes or skin changes. No nail dyscrasia. NEURO:  Nonfocal. Well oriented.  Appropriate affect.    LAB RESULTS:  CMP     Component Value Date/Time   NA 141 11/23/2018 1137   K 4.0 11/23/2018 1137   CL 107 11/23/2018 1137   CO2 24 11/23/2018 1137    GLUCOSE 96 11/23/2018 1137   BUN 11 11/23/2018 1137   CREATININE 0.70 11/23/2018 1137   CREATININE 0.62 08/02/2018 0834   CALCIUM 9.1 11/23/2018 1137   PROT 6.6 11/23/2018 1137   ALBUMIN 3.8 11/23/2018 1137   AST 21 11/23/2018 1137   AST 29 08/02/2018 0834   ALT 23 11/23/2018 1137   ALT 41 08/02/2018 0834   ALKPHOS 99 11/23/2018 1137   BILITOT 0.5 11/23/2018 1137   BILITOT 0.3 08/02/2018 0834   GFRNONAA >60 11/23/2018 1137   GFRNONAA >60 08/02/2018 0834   GFRAA >60 11/23/2018 1137   GFRAA >60 08/02/2018 0834    No results found for: TOTALPROTELP, ALBUMINELP, A1GS, A2GS, BETS, BETA2SER, GAMS, MSPIKE, SPEI  No results  found for: Nils Pyle, [facility]  Lab Results  Component Value Date   WBC 3.6 (L) 11/23/2018   NEUTROABS 1.8 11/23/2018   HGB 11.5 (L) 11/23/2018   HCT 36.3 11/23/2018   MCV 96.5 11/23/2018   PLT 212 11/23/2018    '@LASTCHEMISTRY' @  No results found for: LABCA2  No components found for: OVFIEP329  No results for input(s): INR in the last 168 hours.  No results found for: LABCA2  No results found for: JJO841  No results found for: YSA630  No results found for: ZSW109  No results found for: CA2729  No components found for: HGQUANT  No results found for: CEA1 / No results found for: CEA1   No results found for: AFPTUMOR  No results found for: CHROMOGRNA  No results found for: PSA1  Appointment on 11/23/2018  Component Date Value Ref Range Status  . WBC 11/23/2018 3.6* 4.0 - 10.5 K/uL Final  . RBC 11/23/2018 3.76* 3.87 - 5.11 MIL/uL Final  . Hemoglobin 11/23/2018 11.5* 12.0 - 15.0 g/dL Final  . HCT 11/23/2018 36.3  36.0 - 46.0 % Final  . MCV 11/23/2018 96.5  80.0 - 100.0 fL Final  . MCH 11/23/2018 30.6  26.0 - 34.0 pg Final  . MCHC 11/23/2018 31.7  30.0 - 36.0 g/dL Final  . RDW 11/23/2018 13.2  11.5 - 15.5 % Final  . Platelets 11/23/2018 212  150 - 400 K/uL Final  . nRBC 11/23/2018 0.0  0.0 - 0.2 % Final  . Neutrophils  Relative % 11/23/2018 51  % Final  . Neutro Abs 11/23/2018 1.8  1.7 - 7.7 K/uL Final  . Lymphocytes Relative 11/23/2018 36  % Final  . Lymphs Abs 11/23/2018 1.3  0.7 - 4.0 K/uL Final  . Monocytes Relative 11/23/2018 11  % Final  . Monocytes Absolute 11/23/2018 0.4  0.1 - 1.0 K/uL Final  . Eosinophils Relative 11/23/2018 1  % Final  . Eosinophils Absolute 11/23/2018 0.1  0.0 - 0.5 K/uL Final  . Basophils Relative 11/23/2018 1  % Final  . Basophils Absolute 11/23/2018 0.0  0.0 - 0.1 K/uL Final  . Immature Granulocytes 11/23/2018 0  % Final  . Abs Immature Granulocytes 11/23/2018 0.01  0.00 - 0.07 K/uL Final   Performed at [facility], [place] [address], [place], [place] [address]  . Sodium 11/23/2018 141  135 - 145 mmol/L Final  . Potassium 11/23/2018 4.0  3.5 - 5.1 mmol/L Final  . Chloride 11/23/2018 107  98 - 111 mmol/L Final  . CO2 11/23/2018 24  22 - 32 mmol/L Final  . Glucose, Bld 11/23/2018 96  70 - 99 mg/dL Final  . BUN 11/23/2018 11  6 - 20 mg/dL Final  . Creatinine, Ser 11/23/2018 0.70  0.44 - 1.00 mg/dL Final  . Calcium 11/23/2018 9.1  8.9 - 10.3 mg/dL Final  . Total Protein 11/23/2018 6.6  6.5 - 8.1 g/dL Final  . Albumin 11/23/2018 3.8  3.5 - 5.0 g/dL Final  . AST 11/23/2018 21  15 - 41 U/L Final  . ALT 11/23/2018 23  0 - 44 U/L Final  . Alkaline Phosphatase 11/23/2018 99  38 - 126 U/L Final  . Total Bilirubin 11/23/2018 0.5  0.3 - 1.2 mg/dL Final  . GFR calc non Af Amer 11/23/2018 >60  >60 mL/min Final  . GFR calc Af Amer 11/23/2018 >60  >60 mL/min Final  . Anion gap 11/23/2018 10  5 - 15 Final   Performed at  [facility], [place] [address], [place], [place] [address]    (this displays the last labs from the last 3 days)  No results found for: TOTALPROTELP, ALBUMINELP, A1GS, A2GS, BETS, BETA2SER, GAMS, MSPIKE, SPEI (this displays SPEP labs)  No results found for: KPAFRELGTCHN, LAMBDASER, KAPLAMBRATIO (kappa/lambda light  chains)  No results found for: HGBA, HGBA2QUANT, HGBFQUANT, HGBSQUAN (Hemoglobinopathy evaluation)   No results found for: LDH  No results found for: IRON, TIBC, IRONPCTSAT (Iron and TIBC)  No results found for: FERRITIN  Urinalysis No results found for: COLORURINE, APPEARANCEUR, LABSPEC, PHURINE, GLUCOSEU, HGBUR, BILIRUBINUR, KETONESUR, PROTEINUR, UROBILINOGEN, NITRITE, LEUKOCYTESUR   STUDIES:  No results found.  ELIGIBLE FOR AVAILABLE RESEARCH PROTOCOL: H2992   ASSESSMENT: 58 y.o. [place] woman status post left breast upper outer quadrant biopsy 04/06/2018 for a clinical T2 N2, stage IIIC invasive ductal carcinoma, grade 3, triple negative, with an MIB-1 of 70%  (a) breast MRI 04/19/2018 shows a 5 cm central breast lesion with multiple satellite nodules and greater then 3 abnormal axillary lymph nodes  (b) baseline echocardiogram 04/25/2018 shows an ejection fraction in the 60-65% range  (c) chest CT scan and bone scan showed no metastatic disease.  Nonspecific rib changes will need to be followed  (1) neoadjuvant chemotherapy consisting of doxorubicin and cyclophosphamide given every 21 days x4, starting 05/03/2018, completed 06/28/2018, to be followed by weekly paclitaxel and carboplatin x12 starting 07/20/2018   (a) Doxorubicin and Cyclophosphamide dose reduced for cycles 3 and 4 due to neutropenia  (b) dose dense changed to every 3 weeks for doxorubicin/cyclophosphamide secondary to cytopenias and side effects  (2) left lumpectomy targeted axillary lymph node sampling 09/23/2018 showed a residual ypT1b ypN1 invasive ductal carcinoma, grade 2, again triple negative  (a) completion axillary dissection 10/11/2018 showed an additional 7 lymph nodes all negative for carcinoma  (3) adjuvant radiation after surgery with capecitabine sensitization  (4) continue capecitabine at full dose to complete a total of 6 months  (5) genetics testing 04/27/2018: No pathogenic  mutations.The Multi-Gene Panel offered by [facility] includes sequencing and/or deletion duplication testing of the following 85 genes: AIP, ALK, APC, ATM, AXIN2,BAP1,  BARD1, BLM, BMPR1A, BRCA1, BRCA2, BRIP1, CASR, CDC73, CDH1, CDK4, CDKN1B, CDKN1C, CDKN2A (p14ARF), CDKN2A (p16INK4a), CEBPA, CHEK2, CTNNA1, DICER1, DIS3L2, EGFR (c.2369C>T, p.Thr790Met variant only), EPCAM (Deletion/duplication testing only), FH, FLCN, GATA2, GPC3, GREM1 (Promoter region deletion/duplication testing only), HOXB13 (c.251G>A, p.Gly84Glu), HRAS, KIT, MAX, MEN1, MET, MITF (c.952G>A, p.Glu318Lys variant only), MLH1, MSH2, MSH3, MSH6, MUTYH, NBN, NF1, NF2, NTHL1, PALB2, PDGFRA, PHOX2B, PMS2, POLD1, POLE, POT1, PRKAR1A, PTCH1, PTEN, RAD50, RAD51C, RAD51D, RB1, RECQL4, RET, RNF43, RUNX1, SDHAF2, SDHA (sequence changes only), SDHB, SDHC, SDHD, SMAD4, SMARCA4, SMARCB1, SMARCE1, STK11, SUFU, TERC, TERT, TMEM127, TP53, TSC1, TSC2, VHL, WRN and WT1.   (6) consider E2683  (7) Caris requested 10/20/2018  PLAN: Miamarie is doing well today.  Her labs are stable and I reviewed those with her in detail.  She and I reviewed her plan with radiation, and we will see her on her start day to review the Capecitabine in more detail.  She understands this.  I let her know that my opinion is that the capecitabine could further delay healing and radiation, but I will check with Dr. Jana Hakim, and get his opinion and call her and let her know.  We will see her on 10/21 for labs and f/u.  She was recommended to continue with the appropriate pandemic precautions. She knows to call for any questions that  may arise between now and her next appointment.  We are happy to see her sooner if needed.  A total of (20) minutes of face-to-face time was spent with this patient with greater than 50% of that time in counseling and care-coordination.  Wilber Bihari, NP Medical Oncology and Hematology [facility] [address] [place], [place]  [address] Tel. [phone]    Fax. [phone]

## 2018-11-23 ENCOUNTER — Inpatient Hospital Stay: Payer: BC Managed Care – PPO | Attending: Oncology

## 2018-11-23 ENCOUNTER — Inpatient Hospital Stay (HOSPITAL_BASED_OUTPATIENT_CLINIC_OR_DEPARTMENT_OTHER): Payer: BC Managed Care – PPO | Admitting: Adult Health

## 2018-11-23 ENCOUNTER — Other Ambulatory Visit: Payer: Self-pay

## 2018-11-23 ENCOUNTER — Inpatient Hospital Stay: Payer: BC Managed Care – PPO

## 2018-11-23 ENCOUNTER — Encounter: Payer: Self-pay | Admitting: Adult Health

## 2018-11-23 VITALS — BP 101/70 | HR 105 | Temp 98.3°F | Resp 18 | Ht 64.0 in | Wt 152.3 lb

## 2018-11-23 DIAGNOSIS — Z801 Family history of malignant neoplasm of trachea, bronchus and lung: Secondary | ICD-10-CM | POA: Insufficient documentation

## 2018-11-23 DIAGNOSIS — Z923 Personal history of irradiation: Secondary | ICD-10-CM | POA: Insufficient documentation

## 2018-11-23 DIAGNOSIS — Z8051 Family history of malignant neoplasm of kidney: Secondary | ICD-10-CM | POA: Insufficient documentation

## 2018-11-23 DIAGNOSIS — C50412 Malignant neoplasm of upper-outer quadrant of left female breast: Secondary | ICD-10-CM | POA: Insufficient documentation

## 2018-11-23 DIAGNOSIS — Z9221 Personal history of antineoplastic chemotherapy: Secondary | ICD-10-CM | POA: Insufficient documentation

## 2018-11-23 DIAGNOSIS — Z452 Encounter for adjustment and management of vascular access device: Secondary | ICD-10-CM | POA: Diagnosis not present

## 2018-11-23 DIAGNOSIS — Z95828 Presence of other vascular implants and grafts: Secondary | ICD-10-CM

## 2018-11-23 DIAGNOSIS — Z171 Estrogen receptor negative status [ER-]: Secondary | ICD-10-CM | POA: Diagnosis not present

## 2018-11-23 DIAGNOSIS — Z79899 Other long term (current) drug therapy: Secondary | ICD-10-CM | POA: Diagnosis not present

## 2018-11-23 LAB — COMPREHENSIVE METABOLIC PANEL
ALT: 23 U/L (ref 0–44)
AST: 21 U/L (ref 15–41)
Albumin: 3.8 g/dL (ref 3.5–5.0)
Alkaline Phosphatase: 99 U/L (ref 38–126)
Anion gap: 10 (ref 5–15)
BUN: 11 mg/dL (ref 6–20)
CO2: 24 mmol/L (ref 22–32)
Calcium: 9.1 mg/dL (ref 8.9–10.3)
Chloride: 107 mmol/L (ref 98–111)
Creatinine, Ser: 0.7 mg/dL (ref 0.44–1.00)
GFR calc Af Amer: >60 mL/min (ref >60)
GFR calc non Af Amer: >60 mL/min (ref >60)
Glucose, Bld: 96 mg/dL (ref 70–99)
Potassium: 4 mmol/L (ref 3.5–5.1)
Sodium: 141 mmol/L (ref 135–145)
Total Bilirubin: 0.5 mg/dL (ref 0.3–1.2)
Total Protein: 6.6 g/dL (ref 6.5–8.1)

## 2018-11-23 LAB — CBC WITH DIFFERENTIAL/PLATELET
Abs Immature Granulocytes: 0.01 10*3/uL (ref 0.00–0.07)
Basophils Absolute: 0 10*3/uL (ref 0.0–0.1)
Basophils Relative: 1 %
Eosinophils Absolute: 0.1 10*3/uL (ref 0.0–0.5)
Eosinophils Relative: 1 %
HCT: 36.3 % (ref 36.0–46.0)
Hemoglobin: 11.5 g/dL — ABNORMAL LOW (ref 12.0–15.0)
Immature Granulocytes: 0 %
Lymphocytes Relative: 36 %
Lymphs Abs: 1.3 10*3/uL (ref 0.7–4.0)
MCH: 30.6 pg (ref 26.0–34.0)
MCHC: 31.7 g/dL (ref 30.0–36.0)
MCV: 96.5 fL (ref 80.0–100.0)
Monocytes Absolute: 0.4 10*3/uL (ref 0.1–1.0)
Monocytes Relative: 11 %
Neutro Abs: 1.8 10*3/uL (ref 1.7–7.7)
Neutrophils Relative %: 51 %
Platelets: 212 10*3/uL (ref 150–400)
RBC: 3.76 MIL/uL — ABNORMAL LOW (ref 3.87–5.11)
RDW: 13.2 % (ref 11.5–15.5)
WBC: 3.6 10*3/uL — ABNORMAL LOW (ref 4.0–10.5)
nRBC: 0 % (ref 0.0–0.2)

## 2018-11-23 MED ORDER — HEPARIN SOD (PORK) LOCK FLUSH 100 UNIT/ML IV SOLN
500.0000 [IU] | Freq: Once | INTRAVENOUS | Status: AC
  Administered 2018-11-23: 500 [IU]
  Filled 2018-11-23: qty 5

## 2018-11-23 MED ORDER — SODIUM CHLORIDE 0.9% FLUSH
10.0000 mL | Freq: Once | INTRAVENOUS | Status: AC
  Administered 2018-11-23: 10 mL
  Filled 2018-11-23: qty 10

## 2018-11-24 ENCOUNTER — Encounter: Payer: Self-pay | Admitting: Rehabilitation

## 2018-11-24 ENCOUNTER — Telehealth: Payer: Self-pay | Admitting: Adult Health

## 2018-11-24 ENCOUNTER — Ambulatory Visit: Payer: BC Managed Care – PPO | Attending: Surgery | Admitting: Rehabilitation

## 2018-11-24 DIAGNOSIS — Z171 Estrogen receptor negative status [ER-]: Secondary | ICD-10-CM | POA: Diagnosis present

## 2018-11-24 DIAGNOSIS — C50412 Malignant neoplasm of upper-outer quadrant of left female breast: Secondary | ICD-10-CM | POA: Insufficient documentation

## 2018-11-24 DIAGNOSIS — R293 Abnormal posture: Secondary | ICD-10-CM | POA: Diagnosis present

## 2018-11-24 DIAGNOSIS — M25612 Stiffness of left shoulder, not elsewhere classified: Secondary | ICD-10-CM

## 2018-11-24 NOTE — Telephone Encounter (Signed)
I left a message regarding schedule  

## 2018-11-24 NOTE — Patient Instructions (Signed)
Given medbridge handout but code not copied: Standing dowel flexion and abduction x 10 1-2 x per day and supine hands behind the head stretch x 30-60"

## 2018-11-24 NOTE — Therapy (Signed)
[place], [place], [address] Phone: [phone]   Fax:  [phone]  Physical Therapy Treatment  Patient Details  Name: Kathryn [name] MRN: [id number] Date of Birth: 1960/09/11 Referring Provider (PT): Dr. Brantley Stage   Encounter Date: 11/24/2018  PT End of Session - 11/24/18 1630    Visit Number  1    Number of Visits  7   30 allowed   Date for PT Re-Evaluation  01/05/19    Authorization Type  BCBS    Authorization - Number of Visits  30    PT Start Time  1518    PT Stop Time  1609    PT Time Calculation (min)  51 min    Activity Tolerance  Patient tolerated treatment well    Behavior During Therapy  [facility] for tasks assessed/performed       Past Medical History:  Diagnosis Date  . Breast cancer ([place])    stage 3 - left  . Family history of kidney cancer   . Family history of melanoma   . Headache    hormone related, none since 1st child was born     Past Surgical History:  Procedure Laterality Date  . AXILLARY LYMPH NODE DISSECTION Left 10/11/2018   Procedure: LEFT AXILLARY LYMPH NODE DISSECTION;  Surgeon: Erroll Luna, MD;  Location: [place];  Service: General;  Laterality: Left;  . BREAST LUMPECTOMY WITH RADIOACTIVE SEED AND SENTINEL LYMPH NODE BIOPSY Left 09/23/2018   Procedure: LEFT BREAST LUMPECTOMY WITH RADIOACTIVE SEED AND LEFT AXILLARY TARGETED LYMPH NODE BIOPY AND  LEFT AXILLARY SENTINEL LYMPH NODE MAPPING;  Surgeon: Erroll Luna, MD;  Location: [place];  Service: General;  Laterality: Left;  . CESAREAN SECTION     x3  . PORTACATH PLACEMENT N/A 04/28/2018   Procedure: INSERTION PORT-A-CATH WITH ULTRASOUND;  Surgeon: Erroll Luna, MD;  Location: [place];  Service: General;  Laterality: N/A;    There were no vitals filed for this visit.  Subjective Assessment - 11/24/18 1521    Subjective  I am just doing well.  No swelling noted.  I feel 100% in terms of arm ROM.     Pertinent History  Neoadjuvant chemotherapy getting through about 50% before having to stop, s/p lumpectomy on 09/23/18 due to [facility] and DCIS.  1/1 lymph nodes positive followed by ALND on 10/11/18 with all 7 nodes negative.  Hoping to start radiation 10/21 but delayed due to poor wound healing.  ER/PR/HER2 negative    Limitations  Other (comment)   no limitations   Currently in Pain?  Yes    Pain Score  1     Pain Location  Arm    Pain Orientation  Left    Pain Descriptors / Indicators  Aching    Pain Type  Surgical pain    Pain Onset  More than a month ago    Pain Frequency  Intermittent    Aggravating Factors   movement    Pain Relieving Factors  rest    Effect of Pain on Daily Activities  none         [facility] PT Assessment - 11/24/18 0001      Assessment   Medical Diagnosis  left lumpectomy    Referring Provider (PT)  Dr. Brantley Stage    Onset Date/Surgical Date  09/23/18    Hand Dominance  Right    Next MD Visit  12/14/18    Prior Therapy  no  Precautions   Precaution Comments  lymphedema       Restrictions   Weight Bearing Restrictions  No      Balance Screen   Has the patient fallen in the past 6 months  No    Has the patient had a decrease in activity level because of a fear of falling?   No    Is the patient reluctant to leave their home because of a fear of falling?   No      Home Environment   Living Environment  Private residence    Living Arrangements  Spouse/significant other;Children    Available Help at Discharge  Family      Prior Function   Level of Independence  Independent    Vocation  Part time employment    Vocation Requirements  works for a [employer]    Leisure  I am trying to walk at least 25 minutes per day.       Cognition   Overall Cognitive Status  Within Functional Limits for tasks assessed      Observation/Other Assessments   Observations  see photo.  difficult to heal nipple incision with scab present and covered.  Pt reports that  it just has a hard time healing.  well healed axillary incision. No edema noted. no cording      Sensation   Additional Comments  numbess left under arm      Coordination   Gross Motor Movements are Fluid and Coordinated  Yes      Posture/Postural Control   Posture/Postural Control  Postural limitations    Postural Limitations  Rounded Shoulders;Forward head      ROM / Strength   AROM / PROM / Strength  AROM;Strength      AROM   AROM Assessment Site  Shoulder    Right/Left Shoulder  Right;Left    Right Shoulder Flexion  152 Degrees    Right Shoulder ABduction  172 Degrees    Right Shoulder Internal Rotation  85 Degrees    Right Shoulder External Rotation  80 Degrees    Right Shoulder Horizontal ABduction  30 Degrees    Left Shoulder Flexion  147 Degrees   pull on the anterior shoulder   Left Shoulder ABduction  154 Degrees   pulling in the axilla   Left Shoulder Internal Rotation  85 Degrees    Left Shoulder External Rotation  80 Degrees    Left Shoulder Horizontal ABduction  25 Degrees   pull and no cording     Strength   Strength Assessment Site  Shoulder    Right/Left Shoulder  Right;Left    Right Shoulder Flexion  4/5    Right Shoulder ABduction  4/5    Right Shoulder Internal Rotation  4/5    Right Shoulder External Rotation  4/5    Left Shoulder Flexion  4/5    Left Shoulder ABduction  4/5    Left Shoulder Internal Rotation  4/5    Left Shoulder External Rotation  4-/5        LYMPHEDEMA/ONCOLOGY QUESTIONNAIRE - 11/24/18 1538      Type   Cancer Type  Left breast cancer      Surgeries   Lumpectomy Date  09/23/18    Sentinel Lymph Node Biopsy Date  09/23/18    Other Surgery Date  10/13/18    Number Lymph Nodes Removed  8   1 positive     Treatment   Active Chemotherapy Treatment  No  Past Chemotherapy Treatment  Yes    Active Radiation Treatment  No    Past Radiation Treatment  No    Current Hormone Treatment  No    Past Hormone Therapy  No       What other symptoms do you have   Are you Having Heaviness or Tightness  No    Are you having Pain  No      Lymphedema Assessments   Lymphedema Assessments  Upper extremities      Right Upper Extremity Lymphedema   15 cm Proximal to Olecranon Process  30.5 cm    10 cm Proximal to Olecranon Process  29.8 cm    Olecranon Process  25 cm    15 cm Proximal to Ulnar Styloid Process  24.1 cm    10 cm Proximal to Ulnar Styloid Process  21.7 cm    Just Proximal to Ulnar Styloid Process  16 cm    Across Hand at [employer]  20.3 cm    At [place] of 2nd Digit  6.3 cm    Other  8      Left Upper Extremity Lymphedema   15 cm Proximal to Olecranon Process  30.5 cm    10 cm Proximal to Olecranon Process  330.1 cm    Olecranon Process  25.5 cm    15 cm Proximal to Ulnar Styloid Process  23.7 cm    10 cm Proximal to Ulnar Styloid Process  21.7 cm    Just Proximal to Ulnar Styloid Process  16.1 cm    Across Hand at [employer]  20.5 cm    At [place] of 2nd Digit  6.2 cm    Other  7.5        Quick Dash - 11/24/18 0001    Open a tight or new jar  Mild difficulty    Do heavy household chores (wash walls, wash floors)  No difficulty    Carry a shopping bag or briefcase  No difficulty    Wash your back  No difficulty    Use a knife to cut food  No difficulty    Recreational activities in which you take some force or impact through your arm, shoulder, or hand (golf, hammering, tennis)  No difficulty    During the past week, to what extent has your arm, shoulder or hand problem interfered with your normal social activities with family, friends, neighbors, or groups?  Not at all    During the past week, to what extent has your arm, shoulder or hand problem limited your work or other regular daily activities  Slightly    Arm, shoulder, or hand pain.  Mild    Tingling (pins and needles) in your arm, shoulder, or hand  Moderate    Difficulty Sleeping  Severe difficulty    DASH Score  18.18 %                [facility] Adult PT Treatment/Exercise - 11/24/18 0001      Self-Care   Self-Care  Other Self-Care Comments    Other Self-Care Comments   discussed lymphedema and how to watch for it in the future.  Pt aware of risk reduction strategies already      Exercises   Exercises  Other Exercises    Other Exercises   pt performed and was educated on standing dowel flexion and abduction and supine butterfly stretch to start at home  PT Education - 11/24/18 1629    Education Details  initial POC, HEP    Person(s) Educated  Patient    Methods  Explanation;Demonstration;Tactile cues;Handout;Verbal cues    Comprehension  Verbalized understanding;Returned demonstration;Verbal cues required;Tactile cues required          PT Long Term Goals - 11/24/18 1636      PT LONG TERM GOAL #1   Title  Pt will be educated on lymphedema risk reduction    Time  6    Period  Weeks    Status  Achieved      PT LONG TERM GOAL #2   Title  Pt will improve left shoulder flexion to 152 to equal the opposite side    Time  6    Period  Weeks    Status  New      PT LONG TERM GOAL #3   Title  Pt will improve left shoulder abduction to at least 170 to improve mobility    Time  6    Period  Weeks    Status  New      PT LONG TERM GOAL #4   Title  Pt will decrease QDASH to 8%    Baseline  18.18 on 11/24/18            Plan - 11/24/18 1631    Clinical Impression Statement  Pt presents post left lumpectomy and ALND with 7 nodes removed (1 positive) with decreased shoulder ROM, poor skin healing, postural abnormalities, and risk of lymphedema.  No signs of lymphedema today but pt was educated on how to watch for this in the future and occurence rates.  Began instruction on stretches to regain shoulder ROM.  If skin healing is steady pt will start radiation 10/21 so we will maximize ROM for this.    Personal Factors and Comorbidities  Comorbidity 1    Comorbidities  lymph node  removal    Examination-Activity Limitations  Reach Overhead    Examination-Participation Restrictions  Yard Work;Cleaning;Laundry    Stability/Clinical Decision Making  Stable/Uncomplicated    Clinical Decision Making  Low    Rehab Potential  Excellent    PT Frequency  1x / week    PT Duration  6 weeks    PT Treatment/Interventions  ADLs/Self Care Home Management;Manual lymph drainage;Patient/family education;Therapeutic exercise;Therapeutic activities;Passive range of motion;Scar mobilization;Manual techniques    PT Next Visit Plan  recheck ROM, PROM of the left shoulder, monitor nipple healing PRN, pulleys and AAROM, start supine scap or strength ABC after ROM returns    PT Home Exercise Plan  standing dowel flexion and abduction, supine hands behind head stretch on 11/24/18 (code lost)    Consulted and Agree with Plan of Care  Patient       Patient will benefit from skilled therapeutic intervention in order to improve the following deficits and impairments:  Decreased skin integrity, Decreased scar mobility, Postural dysfunction, Decreased range of motion, Decreased strength  Visit Diagnosis: Malignant neoplasm of upper-outer quadrant of left breast in female, estrogen receptor negative (HCC)  Abnormal posture  Stiffness of left shoulder, not elsewhere classified     Problem List Patient Active Problem List   Diagnosis Date Noted  . Port-A-Cath in place 05/17/2018  . Genetic testing 05/10/2018  . Family history of melanoma   . Family history of kidney cancer   . Malignant neoplasm of upper-outer quadrant of left breast in female, estrogen receptor negative ([place]) 04/11/2018  . Chest pain 05/13/2015  .  Shortness of breath 05/13/2015    [name] 11/24/2018, 4:41 PM  [place], [place], [address] Phone: [phone]   Fax:  [phone]  Name: KIRSTYN LEAN MRN: [id number] Date of Birth:  08/08/1960

## 2018-11-25 ENCOUNTER — Encounter: Payer: Self-pay | Admitting: Adult Health

## 2018-11-25 ENCOUNTER — Telehealth: Payer: Self-pay

## 2018-11-25 NOTE — Telephone Encounter (Signed)
TC to patient in regard to her question about needing the Flu shot. I let her know that we can give it to her in the office when she comes to her appointment on 10/21. Patient verbalized understanding. No further problems or concerns at this time.

## 2018-11-28 ENCOUNTER — Other Ambulatory Visit: Payer: Self-pay | Admitting: Oncology

## 2018-11-28 ENCOUNTER — Ambulatory Visit: Payer: BC Managed Care – PPO

## 2018-11-28 ENCOUNTER — Other Ambulatory Visit: Payer: Self-pay

## 2018-11-28 DIAGNOSIS — M25612 Stiffness of left shoulder, not elsewhere classified: Secondary | ICD-10-CM

## 2018-11-28 DIAGNOSIS — C50412 Malignant neoplasm of upper-outer quadrant of left female breast: Secondary | ICD-10-CM

## 2018-11-28 DIAGNOSIS — Z171 Estrogen receptor negative status [ER-]: Secondary | ICD-10-CM

## 2018-11-28 DIAGNOSIS — R293 Abnormal posture: Secondary | ICD-10-CM

## 2018-11-28 NOTE — Therapy (Signed)
[place], [place], [address] Phone: [phone]   Fax:  [phone]  Physical Therapy Treatment  Patient Details  Name: Kathryn [name] MRN: [id number] Date of Birth: Dec 07, 1960 Referring Provider (PT): Dr. Brantley Stage   Encounter Date: 11/28/2018  PT End of Session - 11/28/18 0947    Visit Number  2    Number of Visits  7    Date for PT Re-Evaluation  01/05/19    Authorization Type  BC[employer], 30 visits allowed    Authorization - Number of Visits  30    PT Start Time  0901    PT Stop Time  0946    PT Time Calculation (min)  45 min    Activity Tolerance  Patient tolerated treatment well    Behavior During Therapy  Kathryn [name] for tasks assessed/performed       Past Medical History:  Diagnosis Date  . Breast cancer ([place])    stage 3 - left  . Family history of kidney cancer   . Family history of melanoma   . Headache    hormone related, none since 1st child was born     Past Surgical History:  Procedure Laterality Date  . AXILLARY LYMPH NODE DISSECTION Left 10/11/2018   Procedure: LEFT AXILLARY LYMPH NODE DISSECTION;  Surgeon: Erroll Luna, MD;  Location: [place];  Service: General;  Laterality: Left;  . BREAST LUMPECTOMY WITH RADIOACTIVE SEED AND SENTINEL LYMPH NODE BIOPSY Left 09/23/2018   Procedure: LEFT BREAST LUMPECTOMY WITH RADIOACTIVE SEED AND LEFT AXILLARY TARGETED LYMPH NODE BIOPY AND  LEFT AXILLARY SENTINEL LYMPH NODE MAPPING;  Surgeon: Erroll Luna, MD;  Location: [place];  Service: General;  Laterality: Left;  . CESAREAN SECTION     x3  . PORTACATH PLACEMENT N/A 04/28/2018   Procedure: INSERTION PORT-A-CATH WITH ULTRASOUND;  Surgeon: Erroll Luna, MD;  Location: [place];  Service: General;  Laterality: N/A;    There were no vitals filed for this visit.  Subjective Assessment - 11/28/18 0906    Subjective  My nipple incision is continuing to improve. Not wearing a bandaid last  few days either as the drainage is improving and almost gone. And I've been doing HEP stretches that [place] gave me last time and they are going well.    Pertinent History  Neoadjuvant chemotherapy getting through about 50% before having to stop, s/p lumpectomy on 09/23/18 due to [facility] and DCIS.  1/1 lymph nodes positive followed by ALND on 10/11/18 with all 7 nodes negative.  Hoping to start radiation 10/21 but delayed due to poor wound healing.  ER/PR/HER2 negative    Currently in Pain?  No/denies                       [facility] Adult PT Treatment/Exercise - 11/28/18 0001      Manual Therapy   Manual Therapy  Myofascial release;Passive ROM    Myofascial Release  To Lt axilla and UE pulling throughout P/ROM    Passive ROM  In Supine: To Lt shoulder into flexion, abduction and D2 to pts tolerance, multiple VCs to relax throughout session                  PT Long Term Goals - 11/24/18 1636      PT LONG TERM GOAL #1   Title  Pt will be educated on lymphedema risk reduction    Time  6    Period  Weeks    Status  Achieved      PT LONG TERM GOAL #2   Title  Pt will improve left shoulder flexion to 152 to equal the opposite side    Time  6    Period  Weeks    Status  New      PT LONG TERM GOAL #3   Title  Pt will improve left shoulder abduction to at least 170 to improve mobility    Time  6    Period  Weeks    Status  New      PT LONG TERM GOAL #4   Title  Pt will decrease QDASH to 8%    Baseline  18.18 on 11/24/18            Plan - 11/28/18 0948    Clinical Impression Statement  First session today of P/ROM and myofascial release to Lt shoulder/axilla. Pt tolerated this very well though required multiple VCs throughout to relax. She reports feeling looser at end of session. Her nipple incision continues to appear to be healing well.    Personal Factors and Comorbidities  Comorbidity 1    Comorbidities  lymph node removal    Examination-Activity Limitations   Reach Overhead    Examination-Participation Restrictions  Yard Work;Cleaning;Laundry    Stability/Clinical Decision Making  Stable/Uncomplicated    Rehab Potential  Excellent    PT Frequency  1x / week    PT Duration  6 weeks    PT Treatment/Interventions  ADLs/Self Care Home Management;Manual lymph drainage;Patient/family education;Therapeutic exercise;Therapeutic activities;Passive range of motion;Scar mobilization;Manual techniques    PT Next Visit Plan  recheck ROM, PROM of the left shoulder, monitor nipple healing PRN, pulleys and AAROM, start supine scap or strength ABC after ROM returns    PT Home Exercise Plan  standing dowel flexion and abduction, supine hands behind head stretch on 11/24/18 (code lost)    Consulted and Agree with Plan of Care  Patient       Patient will benefit from skilled therapeutic intervention in order to improve the following deficits and impairments:  Decreased skin integrity, Decreased scar mobility, Postural dysfunction, Decreased range of motion, Decreased strength  Visit Diagnosis: Malignant neoplasm of upper-outer quadrant of left breast in female, estrogen receptor negative (HCC)  Abnormal posture  Stiffness of left shoulder, not elsewhere classified     Problem List Patient Active Problem List   Diagnosis Date Noted  . Port-A-Cath in place 05/17/2018  . Genetic testing 05/10/2018  . Family history of melanoma   . Family history of kidney cancer   . Malignant neoplasm of upper-outer quadrant of left breast in female, estrogen receptor negative ([place]) 04/11/2018  . Chest pain 05/13/2015  . Shortness of breath 05/13/2015    Otelia Limes, PTA 11/28/2018, 9:53 AM  [place], [place], [address] Phone: [phone]   Fax:  [phone]  Name: Kathryn [name] MRN: [id number] Date of Birth: 03/29/1960

## 2018-12-06 ENCOUNTER — Ambulatory Visit: Payer: BC Managed Care – PPO

## 2018-12-06 ENCOUNTER — Other Ambulatory Visit: Payer: Self-pay

## 2018-12-06 DIAGNOSIS — M25612 Stiffness of left shoulder, not elsewhere classified: Secondary | ICD-10-CM

## 2018-12-06 DIAGNOSIS — R293 Abnormal posture: Secondary | ICD-10-CM

## 2018-12-06 DIAGNOSIS — C50412 Malignant neoplasm of upper-outer quadrant of left female breast: Secondary | ICD-10-CM

## 2018-12-06 NOTE — Patient Instructions (Signed)

## 2018-12-06 NOTE — Therapy (Signed)
[place], [place], [address] Phone: [phone]   Fax:  [phone]  Physical Therapy Treatment  Patient Details  Name: Kathryn [name] MRN: [id number] Date of Birth: February 16, 1961 Referring Provider (PT): Dr. Brantley Stage   Encounter Date: 12/06/2018  PT End of Session - 12/06/18 1602    Visit Number  3    Number of Visits  7    Date for PT Re-Evaluation  01/05/19    Authorization Type  BCBS, 30 visits allowed    PT Start Time  1600    PT Stop Time  1700    PT Time Calculation (min)  60 min    Activity Tolerance  Patient tolerated treatment well    Behavior During Therapy  [facility] for tasks assessed/performed       Past Medical History:  Diagnosis Date  . Breast cancer ([place])    stage 3 - left  . Family history of kidney cancer   . Family history of melanoma   . Headache    hormone related, none since 1st child was born     Past Surgical History:  Procedure Laterality Date  . AXILLARY LYMPH NODE DISSECTION Left 10/11/2018   Procedure: LEFT AXILLARY LYMPH NODE DISSECTION;  Surgeon: Erroll Luna, MD;  Location: [place];  Service: General;  Laterality: Left;  . BREAST LUMPECTOMY WITH RADIOACTIVE SEED AND SENTINEL LYMPH NODE BIOPSY Left 09/23/2018   Procedure: LEFT BREAST LUMPECTOMY WITH RADIOACTIVE SEED AND LEFT AXILLARY TARGETED LYMPH NODE BIOPY AND  LEFT AXILLARY SENTINEL LYMPH NODE MAPPING;  Surgeon: Erroll Luna, MD;  Location: [place];  Service: General;  Laterality: Left;  . CESAREAN SECTION     x3  . PORTACATH PLACEMENT N/A 04/28/2018   Procedure: INSERTION PORT-A-CATH WITH ULTRASOUND;  Surgeon: Erroll Luna, MD;  Location: [place];  Service: General;  Laterality: N/A;    There were no vitals filed for this visit.  Subjective Assessment - 12/06/18 1601    Subjective  Pt reports that her nipple incision has had no drainage the last 2 days and is going to see Dr. Brantley Stage on  12/09/18. She reports that she has been doing her exercises at home and can feel the stretch without pain.    Pertinent History  Neoadjuvant chemotherapy getting through about 50% before having to stop, s/p lumpectomy on 09/23/18 due to [facility] and DCIS.  1/1 lymph nodes positive followed by ALND on 10/11/18 with all 7 nodes negative.  Hoping to start radiation 10/21 but delayed due to poor wound healing.  ER/PR/HER2 negative    Limitations  Other (comment)    Currently in Pain?  Yes    Pain Score  1     Pain Location  Arm    Pain Orientation  Left    Pain Descriptors / Indicators  Tightness    Pain Type  Surgical pain    Pain Onset  More than a month ago    Aggravating Factors   movement especially overhead    Pain Relieving Factors  rest         [facility] PT Assessment - 12/06/18 0001      AROM   Left Shoulder Flexion  145 Degrees    Left Shoulder ABduction  152 Degrees    Left Shoulder Internal Rotation  90 Degrees    Left Shoulder External Rotation  74 Degrees  [facility] Adult PT Treatment/Exercise - 12/06/18 0001      Exercises   Exercises  Shoulder      Shoulder Exercises: Supine   Horizontal ABduction  Strengthening;Both;10 reps;Theraband    Theraband Level (Shoulder Horizontal ABduction)  Level 1 (Yellow)    Horizontal ABduction Limitations  VC for bringing scapula together and down    External Rotation  Strengthening;Both;10 reps;Theraband    Theraband Level (Shoulder External Rotation)  Level 1 (Yellow)    External Rotation Limitations  VC to keep elbows tucked     Flexion  AAROM;Both;10 reps    Flexion Limitations  Dowel, 5 second stretch at the end range VC for pain free rainge to protect incision.     Diagonals  Strengthening;Left;10 reps;Theraband    Theraband Level (Shoulder Diagonals)  Level 1 (Yellow)    Diagonals Limitations  VC for form and to avoid pain as well as tension on incision site.       Shoulder Exercises: Standing   Internal  Rotation  AAROM;10 reps;Left    Internal Rotation Limitations  VC and demonstration for technique and movement.     Flexion  AAROM;10 reps    Flexion Limitations  5 second hold at end range VC to avoid pain    ABduction  AAROM;10 reps    ABduction Limitations  5 second hold at end range VC to avoid pain.     Extension  10 reps;AAROM    Extension Limitations  dowel, VC to maintain upright posture and avoid pain      Manual Therapy   Manual Therapy  Myofascial release;Passive ROM;Soft tissue mobilization    Myofascial Release  L axilla and UE skin stretch to improve PROM    Passive ROM  In Supine: To Lt shoulder into flexion, abduction and D2 to pts tolerance, multiple VCs to relax throughout session             PT Education - 12/06/18 1707    Education Details  Pt was provided with scapular exercises in supine with yellow theraband. PRn checks on incision with no signs of tension or stress. Discussed the importance of going slowly and monitoring her incision and muscle sorenss as she proceeds with light resistance.    Person(s) Educated  Patient    Methods  Explanation;Verbal cues;Demonstration;Handout    Comprehension  Verbalized understanding;Returned demonstration          PT Long Term Goals - 11/24/18 1636      PT LONG TERM GOAL #1   Title  Pt will be educated on lymphedema risk reduction    Time  6    Period  Weeks    Status  Achieved      PT LONG TERM GOAL #2   Title  Pt will improve left shoulder flexion to 152 to equal the opposite side    Time  6    Period  Weeks    Status  New      PT LONG TERM GOAL #3   Title  Pt will improve left shoulder abduction to at least 170 to improve mobility    Time  6    Period  Weeks    Status  New      PT LONG TERM GOAL #4   Title  Pt will decrease QDASH to 8%    Baseline  18.18 on 11/24/18            Plan - 12/06/18 1602    Clinical Impression Statement  Pt presents to physical therapy with continued stiffness in  her l shoulder but reports of decreased pain. Pt was able to tolerate an increasein AROM and light resistance this session w/o an increase in pain from baseline. Her incision was intact at the end of session with PRN checks during ther-ex w/o signs of tension or stress. Tightness noted at the L deltoid; decreased following light STM. Pt will benefit from continued POC.    Personal Factors and Comorbidities  Comorbidity 1    Comorbidities  lymph node removal    Examination-Activity Limitations  Reach Overhead    Examination-Participation Restrictions  Yard Work;Cleaning;Laundry    PT Frequency  1x / week    PT Duration  6 weeks    PT Treatment/Interventions  ADLs/Self Care Home Management;Manual lymph drainage;Patient/family education;Therapeutic exercise;Therapeutic activities;Passive range of motion;Scar mobilization;Manual techniques    PT Next Visit Plan  Assess HEP, Recheck ROM, monitor nipple healing PRN,    PT Home Exercise Plan  Supine scapular sequence and previous supine hands behind head stretch and standing dowel flexion/abd    Consulted and Agree with Plan of Care  Patient       Patient will benefit from skilled therapeutic intervention in order to improve the following deficits and impairments:  Decreased skin integrity, Decreased scar mobility, Postural dysfunction, Decreased range of motion, Decreased strength  Visit Diagnosis: Malignant neoplasm of upper-outer quadrant of left breast in female, estrogen receptor negative (HCC)  Abnormal posture  Stiffness of left shoulder, not elsewhere classified     Problem List Patient Active Problem List   Diagnosis Date Noted  . Port-A-Cath in place 05/17/2018  . Genetic testing 05/10/2018  . Family history of melanoma   . Family history of kidney cancer   . Malignant neoplasm of upper-outer quadrant of left breast in female, estrogen receptor negative ([place]) 04/11/2018  . Chest pain 05/13/2015  . Shortness of breath 05/13/2015     Ander Purpura, PT 12/06/2018, 5:11 PM  [place], [place], [address] Phone: [phone]   Fax:  [phone]  Name: Kathryn [name] MRN: [id number] Date of Birth: 01-15-1961

## 2018-12-09 ENCOUNTER — Ambulatory Visit
Admission: RE | Admit: 2018-12-09 | Discharge: 2018-12-09 | Disposition: A | Discharge status: Home / Self Care | Payer: BC Managed Care – PPO | Source: Ambulatory Visit | Attending: Radiation Oncology | Admitting: Radiation Oncology

## 2018-12-09 ENCOUNTER — Telehealth: Payer: Self-pay | Admitting: Pharmacist

## 2018-12-09 ENCOUNTER — Other Ambulatory Visit: Payer: Self-pay

## 2018-12-09 DIAGNOSIS — C50412 Malignant neoplasm of upper-outer quadrant of left female breast: Secondary | ICD-10-CM | POA: Diagnosis present

## 2018-12-09 DIAGNOSIS — Z171 Estrogen receptor negative status [ER-]: Secondary | ICD-10-CM | POA: Insufficient documentation

## 2018-12-09 DIAGNOSIS — Z510 Encounter for antineoplastic radiation therapy: Secondary | ICD-10-CM | POA: Diagnosis not present

## 2018-12-09 NOTE — Telephone Encounter (Signed)
Oral Chemotherapy Pharmacist Encounter   I spoke with patient for overview of: Xeloda (capecitabine) for the adjuvant treatment of stage IIIC triple-negative breast cancer in conjunction with radiation for ~ 6 week course. Patient is then planned to continue adjuvant Xeloda as monotherapy for 6 months of total Xeloda.   Counseled patient on administration, dosing, side effects, monitoring, drug-food interactions, safe handling, storage, and disposal.  Patient will take Xeloda 500mg  tablets, 2 tablets (1000mg ) by mouth in AM and 2 tabs (1000mg ) by mouth in PM, within 30 minutes of finishing meals, on days of radiation only.  Capecitabine will increase after completion of radiation to ~850 mg/m2 (1500 mg) by mouth 2 times daily for 14 days on, 7 days off, repeated every 21 days to complete her planned capecitabine course -- this prescription will be written once radiation is completed.  Xeloda and radiation start date: 12/14/2018  Adverse effects of Xeloda include but are not limited to: fatigue, decreased blood counts, GI upset, diarrhea, mouth sores, and hand-foot syndrome.  Patient has anti-emetic on hand and knows to take it if nausea develops.   Patient will obtain anti diarrheal and alert the office of 4 or more loose stools above baseline.   Reviewed with patient importance of keeping a medication schedule and plan for any missed doses.  Medication reconciliation performed and medication/allergy list updated.  Insurance authorization for Xeloda has been obtained. Test claim at the pharmacy revealed copayment $0 for 1st fill of Xeloda. This will ship from the [facility] on 12/12/18 to deliver to patient's home on 10/20.  Patient informed her insurance will only allow a 30 calendar day supply to be dispensed from the pharmacy at a time so she will receive her Xeloda for radiation in 2 separate fills.  Patient informed the pharmacy will reach out 5-7 days prior to  needing next fill of Xeloda to coordinate continued medication acquisition to prevent break in therapy.  All questions answered.  [place] voiced understanding and appreciation.   Patient knows to call the office with questions or concerns.  Johny Drilling, PharmD, BCPS, BCOP  12/09/2018  3:29 PM Oral Oncology [facility] [phone]

## 2018-12-12 ENCOUNTER — Other Ambulatory Visit: Payer: Self-pay

## 2018-12-12 ENCOUNTER — Ambulatory Visit: Payer: BC Managed Care – PPO | Admitting: Rehabilitation

## 2018-12-12 ENCOUNTER — Encounter: Payer: Self-pay | Admitting: *Deleted

## 2018-12-12 ENCOUNTER — Encounter: Payer: Self-pay | Admitting: Rehabilitation

## 2018-12-12 DIAGNOSIS — C50412 Malignant neoplasm of upper-outer quadrant of left female breast: Secondary | ICD-10-CM

## 2018-12-12 DIAGNOSIS — M25612 Stiffness of left shoulder, not elsewhere classified: Secondary | ICD-10-CM

## 2018-12-12 DIAGNOSIS — R293 Abnormal posture: Secondary | ICD-10-CM

## 2018-12-12 MED FILL — CAPECITABINE 500 MG TABS: 500 | 28 days supply | Qty: 88 | Fill #0

## 2018-12-12 NOTE — Therapy (Signed)
[place], [place], [address] Phone: [phone]   Fax:  [phone]  Physical Therapy Treatment  Patient Details  Name: Kathryn [name] MRN: [id number] Date of Birth: 1961/02/17 Referring Provider (PT): Dr. Brantley Stage   Encounter Date: 12/12/2018  PT End of Session - 12/12/18 1511    Visit Number  4    Number of Visits  7    Date for PT Re-Evaluation  01/05/19    Authorization Type  BCBS, 30 visits allowed    PT Start Time  1424   arrived late; forgot time   PT Stop Time  1455    PT Time Calculation (min)  31 min       Past Medical History:  Diagnosis Date  . Breast cancer ([place])    stage 3 - left  . Family history of kidney cancer   . Family history of melanoma   . Headache    hormone related, none since 1st child was born     Past Surgical History:  Procedure Laterality Date  . AXILLARY LYMPH NODE DISSECTION Left 10/11/2018   Procedure: LEFT AXILLARY LYMPH NODE DISSECTION;  Surgeon: Kathryn Luna, MD;  [facility];  Service: General;  Laterality: Left;  . BREAST LUMPECTOMY WITH RADIOACTIVE SEED AND SENTINEL LYMPH NODE BIOPSY Left 09/23/2018   Procedure: LEFT BREAST LUMPECTOMY WITH RADIOACTIVE SEED AND LEFT AXILLARY TARGETED LYMPH NODE BIOPY AND  LEFT AXILLARY SENTINEL LYMPH NODE MAPPING;  Surgeon: Kathryn Luna, MD;  Location: [place];  Service: General;  Laterality: Left;  . CESAREAN SECTION     x3  . PORTACATH PLACEMENT N/A 04/28/2018   Procedure: INSERTION PORT-A-CATH WITH ULTRASOUND;  Surgeon: Kathryn Luna, MD;  Location: [place];  Service: General;  Laterality: N/A;    There were no vitals filed for this visit.  Subjective Assessment - 12/12/18 1425    Subjective  I had my simulation on Friday.  I was comfortable.  I start radiation on Wednesday. I feel about 90%.  It still pulls in the arm.    Pertinent History  Neoadjuvant chemotherapy getting through about  50% before having to stop, s/p lumpectomy on 09/23/18 due to [facility] and DCIS.  1/1 lymph nodes positive followed by ALND on 10/11/18 with all 7 nodes negative.  Hoping to start radiation 10/21 but delayed due to poor wound healing.  ER/PR/HER2 negative    Currently in Pain?  No/denies         [facility] PT Assessment - 12/12/18 0001      AROM   Left Shoulder Flexion  150 Degrees    Left Shoulder ABduction  172 Degrees    Left Shoulder Internal Rotation  92 Degrees    Left Shoulder External Rotation  92 Degrees    Left Shoulder Horizontal ABduction  32 Degrees                   [facility] Adult PT Treatment/Exercise - 12/12/18 0001      Exercises   Other Exercises   started instructing pt in strength ABC program completing all stretches and supine crunch, bridge, and low back rotation stretch.  Cueing, demonstration and performance of each.  Pt will continue with current HEP and bring this back next time for final instruction             PT Education - 12/12/18 1511    Education Details  strength ABC    Person(s) Educated  Patient  Methods  Explanation;Demonstration;Tactile cues;Verbal cues;Handout    Comprehension  Verbalized understanding;Returned demonstration          PT Long Term Goals - 12/12/18 1514      PT LONG TERM GOAL #1   Title  Pt will be educated on lymphedema risk reduction    Status  Achieved      PT LONG TERM GOAL #2   Title  Pt will improve left shoulder flexion to 152 to equal the opposite side    Status  Achieved      PT LONG TERM GOAL #3   Title  Pt will improve left shoulder abduction to at least 170 to improve mobility    Status  Achieved      PT LONG TERM GOAL #4   Title  Pt will decrease QDASH to 8%    Status  Partially Met            Plan - 12/12/18 1512    Clinical Impression Statement  Pt returns with ROM now equal to the opposite extremity but still with occasional pulling with overhead stretch.  Will start radiation in 2  days.  started education on strength ABC program and will complete the rest next time.  Pt feels more than 90% at this time.  Also discussed compression sleeve which she will decide to get and will be measured on Monday.    PT Frequency  1x / week    PT Duration  6 weeks    PT Treatment/Interventions  ADLs/Self Care Home Management;Manual lymph drainage;Patient/family education;Therapeutic exercise;Therapeutic activities;Passive range of motion;Scar mobilization;Manual techniques    PT Next Visit Plan  finish strength ABC starting with weight exercises, dash if last visit?  check in after radiation?    Consulted and Agree with Plan of Care  Patient       Patient will benefit from skilled therapeutic intervention in order to improve the following deficits and impairments:     Visit Diagnosis: Malignant neoplasm of upper-outer quadrant of left breast in female, estrogen receptor negative (HCC)  Abnormal posture  Stiffness of left shoulder, not elsewhere classified     Problem List Patient Active Problem List   Diagnosis Date Noted  . Port-A-Cath in place 05/17/2018  . Genetic testing 05/10/2018  . Family history of melanoma   . Family history of kidney cancer   . Malignant neoplasm of upper-outer quadrant of left breast in female, estrogen receptor negative ([place]) 04/11/2018  . Chest pain 05/13/2015  . Shortness of breath 05/13/2015    Kathryn [name] 12/12/2018, 3:17 PM  [place], [place], [address] Phone: [phone]   Fax:  [phone]  Name: Kathryn [name] MRN: [id number] Date of Birth: Jul 10, 1960

## 2018-12-12 NOTE — Telephone Encounter (Signed)
Oral Oncology Patient Advocate Encounter  Confirmed with Ethel that Xeloda was shipped on 10/19 with a $0 copay   [place] Patient [place] Phone [phone] Fax [phone] 12/12/2018   4:11 PM

## 2018-12-13 DIAGNOSIS — C50412 Malignant neoplasm of upper-outer quadrant of left female breast: Secondary | ICD-10-CM | POA: Diagnosis not present

## 2018-12-13 NOTE — Progress Notes (Signed)
  Radiation Oncology         (336) [phone] ________________________________  Name: Kathryn [name] MRN: [id number]  Date: 12/09/2018  DOB: 30-Apr-1960  Optical Surface Tracking Plan:  Since intensity modulated radiotherapy (IMRT) and 3D conformal radiation treatment methods are predicated on accurate and precise positioning for treatment, intrafraction motion monitoring is medically necessary to ensure accurate and safe treatment delivery.  The ability to quantify intrafraction motion without excessive ionizing radiation dose can only be performed with optical surface tracking. Accordingly, surface imaging offers the opportunity to obtain 3D measurements of patient position throughout IMRT and 3D treatments without excessive radiation exposure.  I am ordering optical surface tracking for this patient's upcoming course of radiotherapy. ________________________________  Kyung Rudd, MD 12/13/2018 8:39 AM    Reference:   Particia Jasper, et al. Surface imaging-based analysis of intrafraction motion for breast radiotherapy patients.Journal of [place], n. 6, nov. 2014. ISSN [id number].   Available at: <http://www.jacmp.org/index.php/jacmp/article/view/4957>.

## 2018-12-13 NOTE — Progress Notes (Signed)
  Radiation Oncology         (336) [phone] ________________________________  Name: Kathryn [name] MRN: [id number]  Date: 12/09/2018  DOB: Jul 21, 1960  Diagnosis DIAGNOSIS:     ICD-10-CM   1. Malignant neoplasm of upper-outer quadrant of left breast in female, estrogen receptor negative ([place])  C50.412    Z17.1      SIMULATION AND TREATMENT PLANNING NOTE  The patient presented for simulation prior to beginning her course of radiation treatment for her diagnosis of left-sided breast cancer. The patient was placed in a supine position on a breast board. A customized vac-lock bag was also constructed and this complex treatment device will be used on a daily basis during her treatment. In this fashion, a CT scan was obtained through the chest area and an isocenter was placed near the chest wall at the upper aspect of the right chest. A breath-hold technique has also been evaluated to determine if this significantly improves the spatial relationship between the target region and the heart. Based on this analysis, a breath-hold technique has been ordered for the patient's treatment.  The patient will be planned to receive a course of radiation initially to a dose of 50.4 gray. This will consist of a 4 field technique targeting the left breast as well as the supraclavicular region. Therefore 2 customized medial and lateral tangent fields have been created targeting the chest wall, and also 2 additional customized fields have been designed to treat the supraclavicular region both with a left supraclavicular field and a left posterior axillary boost field. A forward planning/reduced field technique will also be evaluated to determine if this significantly improves the dose homogeneity of the overall plan. Therefore, additional customized blocks/fields may be necessary.  This initial treatment will be accomplished at 1.8 gray per fraction.   The initial plan will consist of a 3-D conformal technique. The  target volume/scar, heart and lungs have been contoured and dose volume histograms of each of these structures will be evaluated as part of the 3-D conformal treatment planning process.   It is anticipated that the patient will then receive a 10 gray boost to the surgical scar. This will be accomplished at 2 gray per fraction. The final anticipated total dose therefore will correspond to 60.4 gray.    Special treatment procedure was performed today due to the extra time and effort required by myself to plan and prepare this patient for deep inspiration breath hold technique.  I have determined cardiac sparing to be of benefit to this patient to prevent long term cardiac damage due to radiation of the heart.  Bellows were placed on the patient's abdomen. To facilitate cardiac sparing, the patient was coached by the radiation therapists on breath hold techniques and breathing practice was performed. Practice waveforms were obtained. The patient was then scanned while maintaining breath hold in the treatment position.  This image was then transferred over to the imaging specialist. The imaging specialist then created a fusion of the free breathing and breath hold scans using the chest wall as the stable structure. I personally reviewed the fusion in axial, coronal and sagittal image planes.  Excellent cardiac sparing was obtained.  I felt the patient is an appropriate candidate for breath hold and the patient will be treated as such.  The image fusion was then reviewed with the patient to reinforce the necessity of reproducible breath hold.     _______________________________   Jodelle Gross, MD, PhD

## 2018-12-14 ENCOUNTER — Other Ambulatory Visit: Payer: Self-pay

## 2018-12-14 ENCOUNTER — Inpatient Hospital Stay: Payer: BC Managed Care – PPO

## 2018-12-14 ENCOUNTER — Encounter: Payer: Self-pay | Admitting: Adult Health

## 2018-12-14 ENCOUNTER — Ambulatory Visit
Admission: RE | Admit: 2018-12-14 | Discharge: 2018-12-14 | Disposition: A | Discharge status: Home / Self Care | Payer: BC Managed Care – PPO | Source: Ambulatory Visit | Attending: Radiation Oncology | Admitting: Radiation Oncology

## 2018-12-14 ENCOUNTER — Inpatient Hospital Stay: Payer: BC Managed Care – PPO | Attending: Oncology | Admitting: Adult Health

## 2018-12-14 VITALS — BP 118/85 | HR 84 | Temp 97.8°F | Resp 18 | Ht 64.0 in | Wt 153.6 lb

## 2018-12-14 DIAGNOSIS — Z8051 Family history of malignant neoplasm of kidney: Secondary | ICD-10-CM | POA: Diagnosis not present

## 2018-12-14 DIAGNOSIS — Z808 Family history of malignant neoplasm of other organs or systems: Secondary | ICD-10-CM | POA: Diagnosis not present

## 2018-12-14 DIAGNOSIS — Z23 Encounter for immunization: Secondary | ICD-10-CM | POA: Diagnosis not present

## 2018-12-14 DIAGNOSIS — Z8249 Family history of ischemic heart disease and other diseases of the circulatory system: Secondary | ICD-10-CM | POA: Insufficient documentation

## 2018-12-14 DIAGNOSIS — C50412 Malignant neoplasm of upper-outer quadrant of left female breast: Secondary | ICD-10-CM | POA: Diagnosis present

## 2018-12-14 DIAGNOSIS — Z801 Family history of malignant neoplasm of trachea, bronchus and lung: Secondary | ICD-10-CM | POA: Insufficient documentation

## 2018-12-14 DIAGNOSIS — Z79899 Other long term (current) drug therapy: Secondary | ICD-10-CM | POA: Insufficient documentation

## 2018-12-14 DIAGNOSIS — Z791 Long term (current) use of non-steroidal anti-inflammatories (NSAID): Secondary | ICD-10-CM | POA: Diagnosis not present

## 2018-12-14 DIAGNOSIS — Z171 Estrogen receptor negative status [ER-]: Secondary | ICD-10-CM | POA: Diagnosis not present

## 2018-12-14 LAB — COMPREHENSIVE METABOLIC PANEL
ALT: 22 U/L (ref 0–44)
AST: 21 U/L (ref 15–41)
Albumin: 3.9 g/dL (ref 3.5–5.0)
Alkaline Phosphatase: 111 U/L (ref 38–126)
Anion gap: 13 (ref 5–15)
BUN: 14 mg/dL (ref 6–20)
CO2: 24 mmol/L (ref 22–32)
Calcium: 9.4 mg/dL (ref 8.9–10.3)
Chloride: 103 mmol/L (ref 98–111)
Creatinine, Ser: 0.7 mg/dL (ref 0.44–1.00)
GFR calc Af Amer: >60 mL/min (ref >60)
GFR calc non Af Amer: >60 mL/min (ref >60)
Glucose, Bld: 106 mg/dL — ABNORMAL HIGH (ref 70–99)
Potassium: 4.3 mmol/L (ref 3.5–5.1)
Sodium: 140 mmol/L (ref 135–145)
Total Bilirubin: 0.4 mg/dL (ref 0.3–1.2)
Total Protein: 7.1 g/dL (ref 6.5–8.1)

## 2018-12-14 LAB — CBC WITH DIFFERENTIAL/PLATELET
Abs Immature Granulocytes: 0.01 10*3/uL (ref 0.00–0.07)
Basophils Absolute: 0 10*3/uL (ref 0.0–0.1)
Basophils Relative: 1 %
Eosinophils Absolute: 0.1 10*3/uL (ref 0.0–0.5)
Eosinophils Relative: 2 %
HCT: 38 % (ref 36.0–46.0)
Hemoglobin: 12.3 g/dL (ref 12.0–15.0)
Immature Granulocytes: 0 %
Lymphocytes Relative: 29 %
Lymphs Abs: 1.4 10*3/uL (ref 0.7–4.0)
MCH: 29.4 pg (ref 26.0–34.0)
MCHC: 32.4 g/dL (ref 30.0–36.0)
MCV: 90.9 fL (ref 80.0–100.0)
Monocytes Absolute: 0.5 10*3/uL (ref 0.1–1.0)
Monocytes Relative: 9 %
Neutro Abs: 3 10*3/uL (ref 1.7–7.7)
Neutrophils Relative %: 59 %
Platelets: 231 10*3/uL (ref 150–400)
RBC: 4.18 MIL/uL (ref 3.87–5.11)
RDW: 13.2 % (ref 11.5–15.5)
WBC: 5 10*3/uL (ref 4.0–10.5)
nRBC: 0 % (ref 0.0–0.2)

## 2018-12-14 MED ORDER — INFLUENZA VAC SPLIT QUAD 0.5 ML IM SUSY
PREFILLED_SYRINGE | INTRAMUSCULAR | Status: AC
  Filled 2018-12-14: qty 0.5

## 2018-12-14 MED ORDER — INFLUENZA VAC SPLIT QUAD 0.5 ML IM SUSY
0.5000 mL | PREFILLED_SYRINGE | Freq: Once | INTRAMUSCULAR | Status: AC
  Administered 2018-12-14: 15:00:00 0.5 mL via INTRAMUSCULAR

## 2018-12-14 NOTE — Progress Notes (Signed)
°[facility]  °[phone] [phone]  ° ° °ID: Kathryn [name] DOB: 09/09/1960  MR#: [id number]  CSN#:[id number] ° °Patient Care Team: °Williams, Sonya, MD as PCP - General (Obstetrics and Gynecology) °Martini, Keisha N, RN as Oncology Nurse Navigator °Stuart, Dawn C, RN as Oncology Nurse Navigator °Cornett, Thomas, MD as Consulting Physician (General Surgery) °Magrinat, Gustav C, MD as Consulting Physician (Oncology) °Moody, John, MD as Consulting Physician (Radiation Oncology) °OTHER MD: Everette (OBGYN) ° ° °CHIEF COMPLAINT: Triple negative breast cancer ° °CURRENT TREATMENT: adjuvant radiation with Capecitabine ° ° °INTERVAL HISTORY: °Kathryn [name] returns today for follow-up and treatment of her triple negative breast cancer. She began radiation today with Capecitabine sensitization. ° ° °REVIEW OF SYSTEMS: °Kathryn [name] is feeling well today.  Her lumpectomy site has healed and she is beginning radiation with Capecitabine sensitization.  She took her first two pills this morning and thus far has tolerated them well.  She has no new issues today.   ° °Kathryn [name] denies any fever, chills, chest pain, palpitations, cough, shortness of breath, nausea, vomiting, bowel/bladder changes, skin changes, or any other concerns.  A detailed ROS was otherwise non contributory today.   ° ° °HISTORY OF CURRENT ILLNESS: °From the original intake note: ° °Kathryn [name] presented with a palpable subareolar left breast lump, nipple retraction, and discharge for approximately 1 week. She underwent bilateral diagnostic mammography with tomography and left breast ultrasonography at [facility] on 04/01/2018 showing: Breast Density Category C. An irregular hyperdense mass is seen in the left subareolar region. An additional oval, circumscribed mass is seen in the inferior central aspect at posterior depth. No additional suspicious findings are identified within the remainder of the left breast. On physical exam,  there is a 2-3 cm firm, fixed lump in the subareolar region on the left. Subtle skin changes and retraction is noted along the left nipple. Targeted ultrasound is performed, showing an irregular hypoechoic mass with associated vascularity at the 12 o'clock retroareolar position on the left. Overall measurements are 2.6 x 2.3 x 1.2 cm. This corresponds with the mammographic finding. Two adjacent circumscribed hypoechoic masses are identified in the deep 6 o'clock position 2 cm from the nipple. They measure 0.8 x 0.6 x 0.4 cm and 1.3 x 1.1 x 0.7 cm. There is no seated vascularity. This corresponds with the additional mammographic finding and likely represents minimally complicated cyst. Evaluation of the left axilla demonstrates a markedly enlarged abnormal lymph node with complete hilar replacement. It measures up to 5 cm in long [name] dimension. No suspicious mammographic findings on the right.  ° °Accordingly on 04/06/2018 she proceeded to biopsy of the left breast area in question. The pathology from this procedure showed ([id number]): invasive ductal carcinoma, grade III, with lymphovascular invasion. Prognostic indicators significant for: estrogen receptor, 0% negative and progesterone receptor, 0% negative. Proliferation marker Ki67 at 70%. HER2 equivocal (2+) by immunohistochemistry, but negative by FISH (print3ed report pending). ° °On the same day she underwent a biopsy of the left axillary lymph node on 04/06/2018 showing ([id number]): metastatic carcinoma in 1 of 1 lymph node (1/1).  ° °The patient's subsequent history is as detailed below. ° ° °PAST MEDICAL HISTORY: °Past Medical History:  °Diagnosis Date  °• Breast cancer (HCC)   ° stage 3 - left  °• Family history of kidney cancer   °• Family history of melanoma   °• Headache   ° hormone related, none since 1st child was born   ° ° ° °  PAST SURGICAL HISTORY: Past Surgical History:  Procedure Laterality Date   AXILLARY LYMPH NODE DISSECTION Left  10/11/2018   Procedure: LEFT AXILLARY LYMPH NODE DISSECTION;  Surgeon: Erroll Luna, MD;  Location: [place];  Service: General;  Laterality: Left;   BREAST LUMPECTOMY WITH RADIOACTIVE SEED AND SENTINEL LYMPH NODE BIOPSY Left 09/23/2018   Procedure: LEFT BREAST LUMPECTOMY WITH RADIOACTIVE SEED AND LEFT AXILLARY TARGETED LYMPH NODE BIOPY AND  LEFT AXILLARY SENTINEL LYMPH NODE MAPPING;  Surgeon: Erroll Luna, MD;  Location: [place];  Service: General;  Laterality: Left;   CESAREAN SECTION     x3   PORTACATH PLACEMENT N/A 04/28/2018   Procedure: INSERTION PORT-A-CATH WITH ULTRASOUND;  Surgeon: Erroll Luna, MD;  Location: [place];  Service: General;  Laterality: N/A;     FAMILY HISTORY: Family History  Problem Relation Age of Onset   Kidney cancer [name] 64       d. 69   Lung cancer Paternal Uncle    Melanoma [name]    Melanoma [name]    Kathryn [name]'s father died from congestive heart failure at age 33. Patients' mother is 48 as of 03/2018. The patient has 1 brother and 3 sisters. Patient denies anyone in her family having breast, ovarian, prostate, or pancreatic cancer. Kathryn [name], Kathryn [name], was diagnosed with Kidney Cancer at 64. Kathryn [name] has an uncle and a cousin that were diagnosed with lung cancer, but they were both heavy smokers.    GYNECOLOGIC HISTORY:  No LMP recorded. Patient is postmenopausal. 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 Her husband, Kathryn [name], owns [employer]. Together, they have three children, Kathryn [name], Kathryn [name], and Kathryn [name]. 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 Kathryn [name] is 84, lives in [place], and is a Equities trader at [employer]. 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    ADVANCED DIRECTIVES: Kathryn [name]'s husband, Naturi Alarid, is automatically her healthcare power of attorney.    HEALTH MAINTENANCE: Social History   Tobacco Use   Smoking status: Never Smoker   Smokeless tobacco: Never Used  Substance Use Topics   Alcohol use: Not Currently    Comment: socially   Drug use: Never    Colonoscopy: no  PAP: 2015  Bone density: no   No Known Allergies  Current Outpatient Medications  Medication Sig Dispense Refill   capecitabine (XELODA) 500 MG tablet Take 2 tablets (1,000 mg total) by mouth 2 (two) times daily after a meal. Take on days of radiation only, M-F 132 tablet 0   docusate sodium (COLACE) 50 MG capsule Take 50 mg by mouth daily.     loratadine (CLARITIN) 10 MG tablet Take 10 mg by mouth every other day.      ibuprofen (ADVIL) 800 MG tablet Take 1 tablet (800 mg total) by mouth every 8 (eight) hours as needed. (Patient not taking: Reported on 12/14/2018) 30 tablet 0   ibuprofen (ADVIL) 800 MG tablet Take 1 tablet (800 mg total) by mouth every 8 (eight) hours as needed. (Patient not taking: Reported on 12/14/2018) 30 tablet 0   lidocaine-prilocaine (EMLA) cream Apply to affected area once (Patient not taking: Reported on 12/14/2018) 30  g 3  °• oxyCODONE (OXY IR/ROXICODONE) 5 MG immediate release tablet Take 1 tablet (5 mg total) by mouth every 6 (six) hours as needed for severe pain. (Patient not taking: Reported on 12/14/2018) 15 tablet 0  °• oxyCODONE (OXY IR/ROXICODONE) 5 MG immediate release tablet Take 1 tablet (5 mg total) by mouth every 6 (six) hours as needed for severe pain. (Patient not taking: Reported on 12/14/2018) 15 tablet 0  ° °No current facility-administered medications for this visit.   ° ° ° °OBJECTIVE:  °Vitals:  ° 12/14/18 1350  °BP: 118/85  °Pulse: 84  °Resp: 18  °Temp: 97.8 °F (36.6 °C)  °SpO2: 100%  °   Body mass index is 26.37  kg/m².   °Wt Readings from Last 3 Encounters:  °12/14/18 153 lb 9.6 oz (69.7 kg)  °11/23/18 152 lb 4.8 oz (69.1 kg)  °10/20/18 150 lb 11.2 oz (68.4 kg)  °ECOG FS:1 ° °GENERAL: Patient is a well appearing female in no acute distress °HEENT:  Sclerae anicteric.  Oropharynx clear and moist. No ulcerations or evidence of oropharyngeal candidiasis. Neck is supple.  °NODES:  No cervical, supraclavicular, or axillary lymphadenopathy palpated.  °BREAST EXAM:  Left lumpectomy site has healed, no sign of local recurrence, right breast is benign °LUNGS:  Clear to auscultation bilaterally.  No wheezes or rhonchi. °HEART:  Regular rate and rhythm. No murmur appreciated. °ABDOMEN:  Soft, nontender.  Positive, normoactive bowel sounds. No organomegaly palpated. °MSK:  No focal spinal tenderness to palpation. Full range of motion bilaterally in the upper extremities. °EXTREMITIES:  No peripheral edema.   °SKIN:  Clear with no obvious rashes or skin changes. No nail dyscrasia. °NEURO:  Nonfocal. Well oriented.  Appropriate affect. ° ° ° °LAB RESULTS: ° °CMP  °   °Component Value Date/Time  ° NA 140 12/14/2018 1305  ° K 4.3 12/14/2018 1305  ° CL 103 12/14/2018 1305  ° CO2 24 12/14/2018 1305  ° GLUCOSE 106 (H) 12/14/2018 1305  ° BUN 14 12/14/2018 1305  ° CREATININE 0.70 12/14/2018 1305  ° CREATININE 0.62 08/02/2018 0834  ° CALCIUM 9.4 12/14/2018 1305  ° PROT 7.1 12/14/2018 1305  ° ALBUMIN 3.9 12/14/2018 1305  ° AST 21 12/14/2018 1305  ° AST 29 08/02/2018 0834  ° ALT 22 12/14/2018 1305  ° ALT 41 08/02/2018 0834  ° ALKPHOS 111 12/14/2018 1305  ° BILITOT 0.4 12/14/2018 1305  ° BILITOT 0.3 08/02/2018 0834  ° GFRNONAA >60 12/14/2018 1305  ° GFRNONAA >60 08/02/2018 0834  ° GFRAA >60 12/14/2018 1305  ° GFRAA >60 08/02/2018 0834  ° ° °No results found for: TOTALPROTELP, ALBUMINELP, A1GS, A2GS, BETS, BETA2SER, GAMS, MSPIKE, SPEI ° °No results found for: KPAFRELGTCHN, LAMBDASER, KAPLAMBRATIO ° °Lab Results  °Component Value Date  ° WBC 5.0  12/14/2018  ° NEUTROABS 3.0 12/14/2018  ° HGB 12.3 12/14/2018  ° HCT 38.0 12/14/2018  ° MCV 90.9 12/14/2018  ° PLT 231 12/14/2018  ° ° °@LASTCHEMISTRY@ ° °No results found for: LABCA2 ° °No components found for: LABCAN125 ° °No results for input(s): INR in the last 168 hours. ° °No results found for: LABCA2 ° °No results found for: CAN199 ° °No results found for: CAN125 ° °No results found for: CAN153 ° °No results found for: CA2729 ° °No components found for: HGQUANT ° °No results found for: CEA1 / No results found for: CEA1  ° °No results found for: AFPTUMOR ° °No results found for: CHROMOGRNA ° °No results found for: PSA1 ° °Appointment   on 12/14/2018  Component Date Value Ref Range Status   WBC 12/14/2018 5.0  4.0 - 10.5 K/uL Final   RBC 12/14/2018 4.18  3.87 - 5.11 MIL/uL Final   Hemoglobin 12/14/2018 12.3  12.0 - 15.0 g/dL Final   HCT 12/14/2018 38.0  36.0 - 46.0 % Final   MCV 12/14/2018 90.9  80.0 - 100.0 fL Final   MCH 12/14/2018 29.4  26.0 - 34.0 pg Final   MCHC 12/14/2018 32.4  30.0 - 36.0 g/dL Final   RDW 12/14/2018 13.2  11.5 - 15.5 % Final   Platelets 12/14/2018 231  150 - 400 K/uL Final   nRBC 12/14/2018 0.0  0.0 - 0.2 % Final   Neutrophils Relative % 12/14/2018 59  % Final   Neutro Abs 12/14/2018 3.0  1.7 - 7.7 K/uL Final   Lymphocytes Relative 12/14/2018 29  % Final   Lymphs Abs 12/14/2018 1.4  0.7 - 4.0 K/uL Final   Monocytes Relative 12/14/2018 9  % Final   Monocytes Absolute 12/14/2018 0.5  0.1 - 1.0 K/uL Final   Eosinophils Relative 12/14/2018 2  % Final   Eosinophils Absolute 12/14/2018 0.1  0.0 - 0.5 K/uL Final   Basophils Relative 12/14/2018 1  % Final   Basophils Absolute 12/14/2018 0.0  0.0 - 0.1 K/uL Final   Immature Granulocytes 12/14/2018 0  % Final   Abs Immature Granulocytes 12/14/2018 0.01  0.00 - 0.07 K/uL Final   Performed at [facility], [place] [address] [place], [place] [address]   Sodium 12/14/2018 140  135 -  145 mmol/L Final   Potassium 12/14/2018 4.3  3.5 - 5.1 mmol/L Final   Chloride 12/14/2018 103  98 - 111 mmol/L Final   CO2 12/14/2018 24  22 - 32 mmol/L Final   Glucose, Bld 12/14/2018 106* 70 - 99 mg/dL Final   BUN 12/14/2018 14  6 - 20 mg/dL Final   Creatinine, Ser 12/14/2018 0.70  0.44 - 1.00 mg/dL Final   Calcium 12/14/2018 9.4  8.9 - 10.3 mg/dL Final   Total Protein 12/14/2018 7.1  6.5 - 8.1 g/dL Final   Albumin 12/14/2018 3.9  3.5 - 5.0 g/dL Final   AST 12/14/2018 21  15 - 41 U/L Final   ALT 12/14/2018 22  0 - 44 U/L Final   Alkaline Phosphatase 12/14/2018 111  38 - 126 U/L Final   Total Bilirubin 12/14/2018 0.4  0.3 - 1.2 mg/dL Final   GFR calc non Af Amer 12/14/2018 >60  >60 mL/min Final   GFR calc Af Amer 12/14/2018 >60  >60 mL/min Final   Anion gap 12/14/2018 13  5 - 15 Final   Performed at [facility], [place] [address] [place], [place] [address]    (this displays the last labs from the last 3 days)  No results found for: TOTALPROTELP, ALBUMINELP, A1GS, A2GS, BETS, BETA2SER, GAMS, MSPIKE, SPEI (this displays SPEP labs)  No results found for: KPAFRELGTCHN, LAMBDASER, KAPLAMBRATIO (kappa/lambda light chains)  No results found for: HGBA, HGBA2QUANT, HGBFQUANT, HGBSQUAN (Hemoglobinopathy evaluation)   No results found for: LDH  No results found for: IRON, TIBC, IRONPCTSAT (Iron and TIBC)  No results found for: FERRITIN  Urinalysis No results found for: COLORURINE, APPEARANCEUR, LABSPEC, PHURINE, GLUCOSEU, HGBUR, BILIRUBINUR, KETONESUR, PROTEINUR, UROBILINOGEN, NITRITE, LEUKOCYTESUR   STUDIES:  No results found.  ELIGIBLE FOR AVAILABLE RESEARCH PROTOCOL: T3646   ASSESSMENT: 58 y.o. [place], [place] woman status post left breast upper outer quadrant biopsy 04/06/2018  for a clinical T2 N2, stage IIIC invasive ductal carcinoma, grade 3, triple negative, with an MIB-1 of 70% ° (a) breast MRI 04/19/2018 shows a 5 cm central breast  lesion with multiple satellite nodules and greater then 3 abnormal axillary lymph nodes ° (b) baseline echocardiogram 04/25/2018 shows an ejection fraction in the 60-65% range ° (c) chest CT scan and bone scan showed no metastatic disease.  Nonspecific rib changes will need to be followed ° °(1) neoadjuvant chemotherapy consisting of doxorubicin and cyclophosphamide given every 21 days x4, starting 05/03/2018, completed 06/28/2018, to be followed by weekly paclitaxel and carboplatin x12 starting 07/20/2018 °  (a) Doxorubicin and Cyclophosphamide dose reduced for cycles 3 and 4 due to neutropenia ° (b) dose dense changed to every 3 weeks for doxorubicin/cyclophosphamide secondary to cytopenias and side effects ° °(2) left lumpectomy targeted axillary lymph node sampling 09/23/2018 showed a residual ypT1b ypN1 invasive ductal carcinoma, grade 2, again triple negative ° (a) completion axillary dissection 10/11/2018 showed an additional 7 lymph nodes all negative for carcinoma ° °(3) adjuvant radiation beginning 12/14/2018 with capecitabine sensitization ° °(4) continue capecitabine at full dose to complete a total of 6 months ° °(5) genetics testing 04/27/2018: No pathogenic mutations.The Multi-Gene Panel offered by [facility] includes sequencing and/or deletion duplication testing of the following 85 genes: AIP, ALK, APC, ATM, AXIN2,BAP1,  BARD1, BLM, BMPR1A, BRCA1, BRCA2, BRIP1, CASR, CDC73, CDH1, CDK4, CDKN1B, CDKN1C, CDKN2A (p14ARF), CDKN2A (p16INK4a), CEBPA, CHEK2, CTNNA1, DICER1, DIS3L2, EGFR (c.2369C>T, p.Thr790Met variant only), EPCAM (Deletion/duplication testing only), FH, FLCN, GATA2, GPC3, GREM1 (Promoter region deletion/duplication testing only), HOXB13 (c.251G>A, p.Gly84Glu), HRAS, KIT, MAX, MEN1, MET, MITF (c.952G>A, p.Glu318Lys variant only), MLH1, MSH2, MSH3, MSH6, MUTYH, NBN, NF1, NF2, NTHL1, PALB2, PDGFRA, PHOX2B, PMS2, POLD1, POLE, POT1, PRKAR1A, PTCH1, PTEN, RAD50, RAD51C, RAD51D, RB1, RECQL4, RET,  RNF43, RUNX1, SDHAF2, SDHA (sequence changes only), SDHB, SDHC, SDHD, SMAD4, SMARCA4, SMARCB1, SMARCE1, STK11, SUFU, TERC, TERT, TMEM127, TP53, TSC1, TSC2, VHL, WRN and WT1.  ° °(6) consider S1418 ° °(7) Caris requested 10/20/2018 ° °PLAN: °Javanna is doing well today.  She has no signs of recurrence.  Her labs have improved and she is feeling well.  She will receive her flu shot today.   ° °She and I reviewed the Capecitabine again, and she knows to take 2 tablets twice a day on Monday through Friday when she is receiving her radiation therapy.  I reviewed that the goal of the Capecitabine for this treatment is to help the radiation to work better, but once she has completed her radiation, we will treat with Capecitabine at treatment doses two weeks on and one week off.  She understands this.   ° °Vielka is very happy that she has healed, is beginning her treatment, and feeling well.  We discussed healthy diet and exercise today.   ° °Anola will return in 4 weeks for labs and follow up.  She was recommended to continue with the appropriate pandemic precautions. She knows to call for any questions that may arise between now and her next appointment.  We are happy to see her sooner if needed. ° °A total of (20) minutes of face-to-face time was spent with this patient with greater than 50% of that time in counseling and care-coordination. ° ° , NP °Medical Oncology and Hematology °[facility] °[address] °[place], [place] [address] °Tel. [phone]    Fax. [phone] ° ° ° °

## 2018-12-15 ENCOUNTER — Ambulatory Visit
Admission: RE | Admit: 2018-12-15 | Discharge: 2018-12-15 | Disposition: A | Discharge status: Home / Self Care | Payer: BC Managed Care – PPO | Source: Ambulatory Visit | Attending: Radiation Oncology | Admitting: Radiation Oncology

## 2018-12-15 ENCOUNTER — Other Ambulatory Visit: Payer: Self-pay

## 2018-12-15 ENCOUNTER — Telehealth: Payer: Self-pay | Admitting: Adult Health

## 2018-12-15 DIAGNOSIS — C50412 Malignant neoplasm of upper-outer quadrant of left female breast: Secondary | ICD-10-CM | POA: Diagnosis not present

## 2018-12-15 NOTE — Telephone Encounter (Signed)
I talk with patient regarding schedule  

## 2018-12-16 ENCOUNTER — Other Ambulatory Visit: Payer: Self-pay

## 2018-12-16 ENCOUNTER — Ambulatory Visit
Admission: RE | Admit: 2018-12-16 | Discharge: 2018-12-16 | Disposition: A | Discharge status: Home / Self Care | Payer: BC Managed Care – PPO | Source: Ambulatory Visit | Attending: Radiation Oncology | Admitting: Radiation Oncology

## 2018-12-16 DIAGNOSIS — C50412 Malignant neoplasm of upper-outer quadrant of left female breast: Secondary | ICD-10-CM

## 2018-12-16 DIAGNOSIS — Z171 Estrogen receptor negative status [ER-]: Secondary | ICD-10-CM

## 2018-12-16 MED ORDER — RADIAPLEXRX EX GEL
Freq: Once | CUTANEOUS | Status: AC
  Administered 2018-12-16: 14:00:00 via TOPICAL

## 2018-12-16 NOTE — Progress Notes (Signed)
Pt here for patient teaching.  Pt given Radiation and You booklet, skin care instructions and Radiaplex gel.  Reviewed areas of pertinence such as fatigue, hair loss, skin changes, breast tenderness and breast swelling . Pt able to give teach back of to pat skin and use unscented/gentle soap,apply Radiaplex bid, avoid applying anything to skin within 4 hours of treatment, avoid wearing an under wire bra and to use an electric razor if they must shave. Pt verbalizes understanding of information given and will contact nursing with any questions or concerns.     Kathryn [name]. Leonie Green, BSN

## 2018-12-19 ENCOUNTER — Ambulatory Visit
Admission: RE | Admit: 2018-12-19 | Discharge: 2018-12-19 | Disposition: A | Discharge status: Home / Self Care | Payer: BC Managed Care – PPO | Source: Ambulatory Visit | Attending: Radiation Oncology | Admitting: Radiation Oncology

## 2018-12-19 ENCOUNTER — Encounter: Payer: BC Managed Care – PPO | Admitting: Rehabilitation

## 2018-12-19 ENCOUNTER — Ambulatory Visit: Payer: BC Managed Care – PPO | Admitting: Radiation Oncology

## 2018-12-19 ENCOUNTER — Other Ambulatory Visit: Payer: Self-pay

## 2018-12-19 DIAGNOSIS — C50412 Malignant neoplasm of upper-outer quadrant of left female breast: Secondary | ICD-10-CM | POA: Diagnosis not present

## 2018-12-20 ENCOUNTER — Ambulatory Visit: Payer: BC Managed Care – PPO

## 2018-12-20 ENCOUNTER — Ambulatory Visit
Admission: RE | Admit: 2018-12-20 | Discharge: 2018-12-20 | Disposition: A | Discharge status: Home / Self Care | Payer: BC Managed Care – PPO | Source: Ambulatory Visit | Attending: Radiation Oncology | Admitting: Radiation Oncology

## 2018-12-20 ENCOUNTER — Other Ambulatory Visit: Payer: Self-pay

## 2018-12-20 DIAGNOSIS — M25612 Stiffness of left shoulder, not elsewhere classified: Secondary | ICD-10-CM

## 2018-12-20 DIAGNOSIS — R293 Abnormal posture: Secondary | ICD-10-CM

## 2018-12-20 DIAGNOSIS — Z171 Estrogen receptor negative status [ER-]: Secondary | ICD-10-CM

## 2018-12-20 DIAGNOSIS — C50412 Malignant neoplasm of upper-outer quadrant of left female breast: Secondary | ICD-10-CM | POA: Diagnosis not present

## 2018-12-20 NOTE — Therapy (Addendum)
[place], [place], [address] Phone: [phone]   Fax:  [phone]  Physical Therapy Treatment  Patient Details  Name: Kathryn [name] MRN: [id number] Date of Birth: 09-18-1960 Referring Provider (PT): Dr. Brantley Stage   Encounter Date: 12/20/2018  PT End of Session - 12/20/18 1044    Visit Number  5    Number of Visits  7    Date for PT Re-Evaluation  01/05/19    Authorization Type  BC[employer], 30 visits allowed    PT Start Time  1003    PT Stop Time  1057    PT Time Calculation (min)  54 min    Activity Tolerance  Patient tolerated treatment well    Behavior During Therapy  [facility] for tasks assessed/performed       Past Medical History:  Diagnosis Date  . Breast cancer ([place])    stage 3 - left  . Family history of kidney cancer   . Family history of melanoma   . Headache    hormone related, none since 1st child was born     Past Surgical History:  Procedure Laterality Date  . AXILLARY LYMPH NODE DISSECTION Left 10/11/2018   Procedure: LEFT AXILLARY LYMPH NODE DISSECTION;  Surgeon: Erroll Luna, MD;  Location: [place];  Service: General;  Laterality: Left;  . BREAST LUMPECTOMY WITH RADIOACTIVE SEED AND SENTINEL LYMPH NODE BIOPSY Left 09/23/2018   Procedure: LEFT BREAST LUMPECTOMY WITH RADIOACTIVE SEED AND LEFT AXILLARY TARGETED LYMPH NODE BIOPY AND  LEFT AXILLARY SENTINEL LYMPH NODE MAPPING;  Surgeon: Erroll Luna, MD;  Location: [place];  Service: General;  Laterality: Left;  . CESAREAN SECTION     x3  . PORTACATH PLACEMENT N/A 04/28/2018   Procedure: INSERTION PORT-A-CATH WITH ULTRASOUND;  Surgeon: Erroll Luna, MD;  Location: [place];  Service: General;  Laterality: N/A;    There were no vitals filed for this visit.  Subjective Assessment - 12/20/18 1016    Subjective  I am doing the exercises and still feel good stretches. I think I am ready to be put on hold but would like to  make an appt to come back for a final assess after radiation.    Pertinent History  Neoadjuvant chemotherapy getting through about 50% before having to stop, s/p lumpectomy on 09/23/18 due to [facility] and DCIS.  1/1 lymph nodes positive followed by ALND on 10/11/18 with all 7 nodes negative.  Hoping to start radiation 10/21 but delayed due to poor wound healing.  ER/PR/HER2 negative    Currently in Pain?  No/denies               [name] - 12/20/18 0001    Open a tight or new jar  No difficulty    Do heavy household chores (wash walls, wash floors)  No difficulty    Carry a shopping bag or briefcase  No difficulty    Wash your back  No difficulty    Use a knife to cut food  No difficulty    Recreational activities in which you take some force or impact through your arm, shoulder, or hand (golf, hammering, tennis)  No difficulty    During the past week, to what extent has your arm, shoulder or hand problem interfered with your normal social activities with family, friends, neighbors, or groups?  Not at all    During the past week, to what extent has your arm, shoulder or hand problem  limited your work or other regular daily activities  Not at all    Arm, shoulder, or hand pain.  Mild    Tingling (pins and needles) in your arm, shoulder, or hand  Mild    Difficulty Sleeping  No difficulty    DASH Score  4.55 %             [facility] Adult PT Treatment/Exercise - 12/20/18 0001      Exercises   Other Exercises   Completed instruction of Strength ABC with pt returning therapist demo                  PT [place] - 12/20/18 [place] #1   Title  Pt will be educated on lymphedema risk reduction    Status  Achieved      PT LONG TERM GOAL #2   Title  Pt will improve left shoulder flexion to 152 to equal the opposite side    Status  Achieved      PT LONG TERM GOAL #3   Title  Pt will improve left shoulder abduction to at least 170 to improve  mobility    Status  Achieved      PT LONG TERM GOAL #4   Title  Pt will decrease QDASH to 8%    Baseline  18.18 on 11/24/18; 4.55 on 12/20/18    Status  Achieved            Plan - 12/20/18 1052    Clinical Impression Statement  Pt is doing very well overall with ROM being imporved and having met that goal. Is also independent with HEP for stretching and strength. Pt will be placed on hold until after radiation to return for final assess.    Personal Factors and Comorbidities  Comorbidity 1    Comorbidities  lymph node removal    Examination-Activity Limitations  Reach Overhead    Examination-Participation Restrictions  Yard Work;Cleaning;Laundry    Stability/Clinical Decision Making  Stable/Uncomplicated    Rehab Potential  Excellent    PT Frequency  1x / week    PT Duration  6 weeks    PT Treatment/Interventions  ADLs/Self Care Home Management;Manual lymph drainage;Patient/family education;Therapeutic exercise;Therapeutic activities;Passive range of motion;Scar mobilization;Manual techniques    PT Next Visit Plan  On hold until after radiation, then final assess    PT Home Exercise Plan  Supine scapular sequence and previous supine hands behind head stretch and standing dowel flexion/abd; Strength ABC Program    Consulted and Agree with Plan of Care  Patient       Patient will benefit from skilled therapeutic intervention in order to improve the following deficits and impairments:  Decreased skin integrity, Decreased scar mobility, Postural dysfunction, Decreased range of motion, Decreased strength  Visit Diagnosis: Malignant neoplasm of upper-outer quadrant of left breast in female, estrogen receptor negative (HCC)  Abnormal posture  Stiffness of left shoulder, not elsewhere classified     Problem List Patient Active Problem List   Diagnosis Date Noted  . Port-A-Cath in place 05/17/2018  . Genetic testing 05/10/2018  . Family history of melanoma   . Family history  of kidney cancer   . Malignant neoplasm of upper-outer quadrant of left breast in female, estrogen receptor negative ([place]) 04/11/2018  . Chest pain 05/13/2015  . Shortness of breath 05/13/2015    Kathryn [name], PTA 12/20/2018, 11:01 AM  [facility]  [place], [place], [address] Phone: [phone]   Fax:  [phone]  Name: CHASTA DESHPANDE MRN: [id number] Date of Birth: 13-Jun-1960  PHYSICAL THERAPY DISCHARGE SUMMARY  Visits from Start of Care: 5  Current functional level related to goals / functional outcomes: Pt reports via phone call that she is still doing very well after radiation but actually fell and broke her other arm so we will DC this episode until she returns for that.    Remaining deficits: none   Education / Equipment: HEP, compression Plan: Patient agrees to discharge.  Patient goals were met. Patient is being discharged due to meeting the stated rehab goals.  ?????    Shan Levans, PT

## 2018-12-21 ENCOUNTER — Ambulatory Visit
Admission: RE | Admit: 2018-12-21 | Discharge: 2018-12-21 | Disposition: A | Discharge status: Home / Self Care | Payer: BC Managed Care – PPO | Source: Ambulatory Visit | Attending: Radiation Oncology | Admitting: Radiation Oncology

## 2018-12-21 ENCOUNTER — Ambulatory Visit: Payer: BC Managed Care – PPO

## 2018-12-21 ENCOUNTER — Other Ambulatory Visit: Payer: Self-pay

## 2018-12-21 DIAGNOSIS — C50412 Malignant neoplasm of upper-outer quadrant of left female breast: Secondary | ICD-10-CM | POA: Diagnosis not present

## 2018-12-22 ENCOUNTER — Ambulatory Visit
Admission: RE | Admit: 2018-12-22 | Discharge: 2018-12-22 | Disposition: A | Discharge status: Home / Self Care | Payer: BC Managed Care – PPO | Source: Ambulatory Visit | Attending: Radiation Oncology | Admitting: Radiation Oncology

## 2018-12-22 ENCOUNTER — Other Ambulatory Visit: Payer: Self-pay

## 2018-12-22 ENCOUNTER — Encounter: Payer: Self-pay | Admitting: Oncology

## 2018-12-22 ENCOUNTER — Ambulatory Visit: Payer: BC Managed Care – PPO

## 2018-12-22 DIAGNOSIS — C50412 Malignant neoplasm of upper-outer quadrant of left female breast: Secondary | ICD-10-CM | POA: Diagnosis not present

## 2018-12-23 ENCOUNTER — Ambulatory Visit: Payer: BC Managed Care – PPO

## 2018-12-23 ENCOUNTER — Other Ambulatory Visit: Payer: Self-pay

## 2018-12-23 ENCOUNTER — Ambulatory Visit
Admission: RE | Admit: 2018-12-23 | Discharge: 2018-12-23 | Disposition: A | Discharge status: Home / Self Care | Payer: BC Managed Care – PPO | Source: Ambulatory Visit | Attending: Radiation Oncology | Admitting: Radiation Oncology

## 2018-12-23 DIAGNOSIS — C50412 Malignant neoplasm of upper-outer quadrant of left female breast: Secondary | ICD-10-CM | POA: Diagnosis not present

## 2018-12-26 ENCOUNTER — Ambulatory Visit: Payer: BC Managed Care – PPO

## 2018-12-26 ENCOUNTER — Other Ambulatory Visit: Payer: Self-pay

## 2018-12-26 ENCOUNTER — Ambulatory Visit
Admission: RE | Admit: 2018-12-26 | Discharge: 2018-12-26 | Disposition: A | Discharge status: Home / Self Care | Payer: BC Managed Care – PPO | Source: Ambulatory Visit | Attending: Radiation Oncology | Admitting: Radiation Oncology

## 2018-12-26 DIAGNOSIS — C50412 Malignant neoplasm of upper-outer quadrant of left female breast: Secondary | ICD-10-CM | POA: Insufficient documentation

## 2018-12-26 DIAGNOSIS — Z510 Encounter for antineoplastic radiation therapy: Secondary | ICD-10-CM | POA: Diagnosis not present

## 2018-12-26 DIAGNOSIS — Z171 Estrogen receptor negative status [ER-]: Secondary | ICD-10-CM | POA: Diagnosis not present

## 2018-12-27 ENCOUNTER — Ambulatory Visit
Admission: RE | Admit: 2018-12-27 | Discharge: 2018-12-27 | Disposition: A | Discharge status: Home / Self Care | Payer: BC Managed Care – PPO | Source: Ambulatory Visit | Attending: Radiation Oncology | Admitting: Radiation Oncology

## 2018-12-27 ENCOUNTER — Ambulatory Visit: Payer: BC Managed Care – PPO

## 2018-12-27 ENCOUNTER — Other Ambulatory Visit: Payer: Self-pay

## 2018-12-27 DIAGNOSIS — Z171 Estrogen receptor negative status [ER-]: Secondary | ICD-10-CM | POA: Diagnosis not present

## 2018-12-27 DIAGNOSIS — C50412 Malignant neoplasm of upper-outer quadrant of left female breast: Secondary | ICD-10-CM | POA: Diagnosis not present

## 2018-12-27 DIAGNOSIS — Z510 Encounter for antineoplastic radiation therapy: Secondary | ICD-10-CM | POA: Diagnosis not present

## 2018-12-28 ENCOUNTER — Ambulatory Visit
Admission: RE | Admit: 2018-12-28 | Discharge: 2018-12-28 | Disposition: A | Discharge status: Home / Self Care | Payer: BC Managed Care – PPO | Source: Ambulatory Visit | Attending: Radiation Oncology | Admitting: Radiation Oncology

## 2018-12-28 ENCOUNTER — Other Ambulatory Visit: Payer: Self-pay

## 2018-12-28 ENCOUNTER — Ambulatory Visit: Payer: BC Managed Care – PPO

## 2018-12-28 DIAGNOSIS — Z171 Estrogen receptor negative status [ER-]: Secondary | ICD-10-CM | POA: Diagnosis not present

## 2018-12-28 DIAGNOSIS — C50412 Malignant neoplasm of upper-outer quadrant of left female breast: Secondary | ICD-10-CM | POA: Diagnosis not present

## 2018-12-28 DIAGNOSIS — Z510 Encounter for antineoplastic radiation therapy: Secondary | ICD-10-CM | POA: Diagnosis not present

## 2018-12-29 ENCOUNTER — Other Ambulatory Visit: Payer: Self-pay

## 2018-12-29 ENCOUNTER — Ambulatory Visit: Payer: BC Managed Care – PPO

## 2018-12-29 ENCOUNTER — Ambulatory Visit
Admission: RE | Admit: 2018-12-29 | Discharge: 2018-12-29 | Disposition: A | Discharge status: Home / Self Care | Payer: BC Managed Care – PPO | Source: Ambulatory Visit | Attending: Radiation Oncology | Admitting: Radiation Oncology

## 2018-12-29 DIAGNOSIS — Z171 Estrogen receptor negative status [ER-]: Secondary | ICD-10-CM | POA: Diagnosis not present

## 2018-12-29 DIAGNOSIS — C50412 Malignant neoplasm of upper-outer quadrant of left female breast: Secondary | ICD-10-CM | POA: Diagnosis not present

## 2018-12-29 DIAGNOSIS — Z510 Encounter for antineoplastic radiation therapy: Secondary | ICD-10-CM | POA: Diagnosis not present

## 2018-12-30 ENCOUNTER — Ambulatory Visit
Admission: RE | Admit: 2018-12-30 | Discharge: 2018-12-30 | Disposition: A | Discharge status: Home / Self Care | Payer: BC Managed Care – PPO | Source: Ambulatory Visit | Attending: Radiation Oncology | Admitting: Radiation Oncology

## 2018-12-30 ENCOUNTER — Ambulatory Visit: Payer: BC Managed Care – PPO

## 2018-12-30 ENCOUNTER — Other Ambulatory Visit: Payer: Self-pay

## 2018-12-30 DIAGNOSIS — C50412 Malignant neoplasm of upper-outer quadrant of left female breast: Secondary | ICD-10-CM | POA: Diagnosis not present

## 2018-12-30 DIAGNOSIS — Z171 Estrogen receptor negative status [ER-]: Secondary | ICD-10-CM | POA: Diagnosis not present

## 2018-12-30 DIAGNOSIS — Z510 Encounter for antineoplastic radiation therapy: Secondary | ICD-10-CM | POA: Diagnosis not present

## 2019-01-02 ENCOUNTER — Ambulatory Visit: Payer: BC Managed Care – PPO

## 2019-01-02 ENCOUNTER — Other Ambulatory Visit: Payer: Self-pay

## 2019-01-02 ENCOUNTER — Ambulatory Visit
Admission: RE | Admit: 2019-01-02 | Discharge: 2019-01-02 | Disposition: A | Discharge status: Home / Self Care | Payer: BC Managed Care – PPO | Source: Ambulatory Visit | Attending: Radiation Oncology | Admitting: Radiation Oncology

## 2019-01-02 DIAGNOSIS — C50412 Malignant neoplasm of upper-outer quadrant of left female breast: Secondary | ICD-10-CM | POA: Diagnosis not present

## 2019-01-02 DIAGNOSIS — Z171 Estrogen receptor negative status [ER-]: Secondary | ICD-10-CM | POA: Diagnosis not present

## 2019-01-02 DIAGNOSIS — Z510 Encounter for antineoplastic radiation therapy: Secondary | ICD-10-CM | POA: Diagnosis not present

## 2019-01-03 ENCOUNTER — Other Ambulatory Visit: Payer: Self-pay

## 2019-01-03 ENCOUNTER — Ambulatory Visit: Payer: BC Managed Care – PPO

## 2019-01-03 ENCOUNTER — Ambulatory Visit
Admission: RE | Admit: 2019-01-03 | Discharge: 2019-01-03 | Disposition: A | Discharge status: Home / Self Care | Payer: BC Managed Care – PPO | Source: Ambulatory Visit | Attending: Radiation Oncology | Admitting: Radiation Oncology

## 2019-01-03 DIAGNOSIS — Z171 Estrogen receptor negative status [ER-]: Secondary | ICD-10-CM | POA: Diagnosis not present

## 2019-01-03 DIAGNOSIS — C50412 Malignant neoplasm of upper-outer quadrant of left female breast: Secondary | ICD-10-CM | POA: Diagnosis not present

## 2019-01-03 DIAGNOSIS — Z510 Encounter for antineoplastic radiation therapy: Secondary | ICD-10-CM | POA: Diagnosis not present

## 2019-01-04 ENCOUNTER — Other Ambulatory Visit: Payer: Self-pay

## 2019-01-04 ENCOUNTER — Ambulatory Visit: Payer: BC Managed Care – PPO

## 2019-01-04 ENCOUNTER — Ambulatory Visit
Admission: RE | Admit: 2019-01-04 | Discharge: 2019-01-04 | Disposition: A | Discharge status: Home / Self Care | Payer: BC Managed Care – PPO | Source: Ambulatory Visit | Attending: Radiation Oncology | Admitting: Radiation Oncology

## 2019-01-04 DIAGNOSIS — Z171 Estrogen receptor negative status [ER-]: Secondary | ICD-10-CM | POA: Diagnosis not present

## 2019-01-04 DIAGNOSIS — C50412 Malignant neoplasm of upper-outer quadrant of left female breast: Secondary | ICD-10-CM | POA: Diagnosis not present

## 2019-01-04 DIAGNOSIS — Z510 Encounter for antineoplastic radiation therapy: Secondary | ICD-10-CM | POA: Diagnosis not present

## 2019-01-05 ENCOUNTER — Ambulatory Visit
Admission: RE | Admit: 2019-01-05 | Discharge: 2019-01-05 | Disposition: A | Discharge status: Home / Self Care | Payer: BC Managed Care – PPO | Source: Ambulatory Visit | Attending: Radiation Oncology | Admitting: Radiation Oncology

## 2019-01-05 ENCOUNTER — Ambulatory Visit: Payer: BC Managed Care – PPO

## 2019-01-05 ENCOUNTER — Other Ambulatory Visit: Payer: Self-pay

## 2019-01-05 DIAGNOSIS — Z171 Estrogen receptor negative status [ER-]: Secondary | ICD-10-CM | POA: Diagnosis not present

## 2019-01-05 DIAGNOSIS — Z510 Encounter for antineoplastic radiation therapy: Secondary | ICD-10-CM | POA: Diagnosis not present

## 2019-01-05 DIAGNOSIS — C50412 Malignant neoplasm of upper-outer quadrant of left female breast: Secondary | ICD-10-CM | POA: Diagnosis not present

## 2019-01-06 ENCOUNTER — Ambulatory Visit
Admission: RE | Admit: 2019-01-06 | Discharge: 2019-01-06 | Disposition: A | Discharge status: Home / Self Care | Payer: BC Managed Care – PPO | Source: Ambulatory Visit | Attending: Radiation Oncology | Admitting: Radiation Oncology

## 2019-01-06 ENCOUNTER — Ambulatory Visit: Payer: BC Managed Care – PPO

## 2019-01-06 ENCOUNTER — Other Ambulatory Visit: Payer: Self-pay

## 2019-01-06 DIAGNOSIS — C50412 Malignant neoplasm of upper-outer quadrant of left female breast: Secondary | ICD-10-CM | POA: Diagnosis not present

## 2019-01-06 DIAGNOSIS — Z510 Encounter for antineoplastic radiation therapy: Secondary | ICD-10-CM | POA: Diagnosis not present

## 2019-01-06 DIAGNOSIS — Z171 Estrogen receptor negative status [ER-]: Secondary | ICD-10-CM | POA: Diagnosis not present

## 2019-01-06 MED FILL — CAPECITABINE 500 MG TABS: 500 | 14 days supply | Qty: 44 | Fill #1

## 2019-01-09 ENCOUNTER — Ambulatory Visit
Admission: RE | Admit: 2019-01-09 | Discharge: 2019-01-09 | Disposition: A | Discharge status: Home / Self Care | Payer: BC Managed Care – PPO | Source: Ambulatory Visit | Attending: Radiation Oncology | Admitting: Radiation Oncology

## 2019-01-09 ENCOUNTER — Other Ambulatory Visit: Payer: Self-pay

## 2019-01-09 ENCOUNTER — Ambulatory Visit: Payer: BC Managed Care – PPO

## 2019-01-09 DIAGNOSIS — Z171 Estrogen receptor negative status [ER-]: Secondary | ICD-10-CM | POA: Diagnosis not present

## 2019-01-09 DIAGNOSIS — C50412 Malignant neoplasm of upper-outer quadrant of left female breast: Secondary | ICD-10-CM | POA: Diagnosis not present

## 2019-01-09 DIAGNOSIS — Z510 Encounter for antineoplastic radiation therapy: Secondary | ICD-10-CM | POA: Diagnosis not present

## 2019-01-10 ENCOUNTER — Other Ambulatory Visit: Payer: Self-pay

## 2019-01-10 ENCOUNTER — Ambulatory Visit
Admission: RE | Admit: 2019-01-10 | Discharge: 2019-01-10 | Disposition: A | Discharge status: Home / Self Care | Payer: BC Managed Care – PPO | Source: Ambulatory Visit | Attending: Radiation Oncology | Admitting: Radiation Oncology

## 2019-01-10 ENCOUNTER — Ambulatory Visit: Payer: BC Managed Care – PPO

## 2019-01-10 DIAGNOSIS — Z510 Encounter for antineoplastic radiation therapy: Secondary | ICD-10-CM | POA: Diagnosis not present

## 2019-01-10 DIAGNOSIS — C50412 Malignant neoplasm of upper-outer quadrant of left female breast: Secondary | ICD-10-CM | POA: Diagnosis not present

## 2019-01-10 DIAGNOSIS — Z171 Estrogen receptor negative status [ER-]: Secondary | ICD-10-CM | POA: Diagnosis not present

## 2019-01-11 ENCOUNTER — Encounter: Payer: Self-pay | Admitting: Adult Health

## 2019-01-11 ENCOUNTER — Inpatient Hospital Stay: Payer: BC Managed Care – PPO

## 2019-01-11 ENCOUNTER — Telehealth: Payer: Self-pay | Admitting: Oncology

## 2019-01-11 ENCOUNTER — Ambulatory Visit
Admission: RE | Admit: 2019-01-11 | Discharge: 2019-01-11 | Disposition: A | Discharge status: Home / Self Care | Payer: BC Managed Care – PPO | Source: Ambulatory Visit | Attending: Radiation Oncology | Admitting: Radiation Oncology

## 2019-01-11 ENCOUNTER — Other Ambulatory Visit: Payer: Self-pay

## 2019-01-11 ENCOUNTER — Inpatient Hospital Stay: Payer: BC Managed Care – PPO | Attending: Oncology | Admitting: Adult Health

## 2019-01-11 ENCOUNTER — Ambulatory Visit: Payer: BC Managed Care – PPO

## 2019-01-11 VITALS — BP 127/74 | HR 86 | Temp 98.0°F | Resp 18 | Ht 64.0 in | Wt 152.0 lb

## 2019-01-11 DIAGNOSIS — Z923 Personal history of irradiation: Secondary | ICD-10-CM | POA: Insufficient documentation

## 2019-01-11 DIAGNOSIS — Z8051 Family history of malignant neoplasm of kidney: Secondary | ICD-10-CM | POA: Diagnosis not present

## 2019-01-11 DIAGNOSIS — Z791 Long term (current) use of non-steroidal anti-inflammatories (NSAID): Secondary | ICD-10-CM | POA: Diagnosis not present

## 2019-01-11 DIAGNOSIS — Z9221 Personal history of antineoplastic chemotherapy: Secondary | ICD-10-CM | POA: Diagnosis not present

## 2019-01-11 DIAGNOSIS — Z452 Encounter for adjustment and management of vascular access device: Secondary | ICD-10-CM | POA: Diagnosis not present

## 2019-01-11 DIAGNOSIS — Z79899 Other long term (current) drug therapy: Secondary | ICD-10-CM | POA: Insufficient documentation

## 2019-01-11 DIAGNOSIS — Z801 Family history of malignant neoplasm of trachea, bronchus and lung: Secondary | ICD-10-CM | POA: Diagnosis not present

## 2019-01-11 DIAGNOSIS — C50412 Malignant neoplasm of upper-outer quadrant of left female breast: Secondary | ICD-10-CM

## 2019-01-11 DIAGNOSIS — Z171 Estrogen receptor negative status [ER-]: Secondary | ICD-10-CM | POA: Diagnosis not present

## 2019-01-11 DIAGNOSIS — Z8249 Family history of ischemic heart disease and other diseases of the circulatory system: Secondary | ICD-10-CM | POA: Insufficient documentation

## 2019-01-11 DIAGNOSIS — Z510 Encounter for antineoplastic radiation therapy: Secondary | ICD-10-CM | POA: Diagnosis not present

## 2019-01-11 DIAGNOSIS — Z95828 Presence of other vascular implants and grafts: Secondary | ICD-10-CM

## 2019-01-11 LAB — CBC WITH DIFFERENTIAL/PLATELET
Abs Immature Granulocytes: 0.01 10*3/uL (ref 0.00–0.07)
Basophils Absolute: 0 10*3/uL (ref 0.0–0.1)
Basophils Relative: 1 %
Eosinophils Absolute: 0 10*3/uL (ref 0.0–0.5)
Eosinophils Relative: 1 %
HCT: 36.4 % (ref 36.0–46.0)
Hemoglobin: 11.9 g/dL — ABNORMAL LOW (ref 12.0–15.0)
Immature Granulocytes: 0 %
Lymphocytes Relative: 19 %
Lymphs Abs: 0.6 10*3/uL — ABNORMAL LOW (ref 0.7–4.0)
MCH: 30.1 pg (ref 26.0–34.0)
MCHC: 32.7 g/dL (ref 30.0–36.0)
MCV: 91.9 fL (ref 80.0–100.0)
Monocytes Absolute: 0.4 10*3/uL (ref 0.1–1.0)
Monocytes Relative: 13 %
Neutro Abs: 2 10*3/uL (ref 1.7–7.7)
Neutrophils Relative %: 66 %
Platelets: 184 10*3/uL (ref 150–400)
RBC: 3.96 MIL/uL (ref 3.87–5.11)
RDW: 16.1 % — ABNORMAL HIGH (ref 11.5–15.5)
WBC: 3 10*3/uL — ABNORMAL LOW (ref 4.0–10.5)
nRBC: 0 % (ref 0.0–0.2)

## 2019-01-11 LAB — COMPREHENSIVE METABOLIC PANEL
ALT: 16 U/L (ref 0–44)
AST: 18 U/L (ref 15–41)
Albumin: 4 g/dL (ref 3.5–5.0)
Alkaline Phosphatase: 100 U/L (ref 38–126)
Anion gap: 11 (ref 5–15)
BUN: 16 mg/dL (ref 6–20)
CO2: 23 mmol/L (ref 22–32)
Calcium: 9 mg/dL (ref 8.9–10.3)
Chloride: 106 mmol/L (ref 98–111)
Creatinine, Ser: 0.74 mg/dL (ref 0.44–1.00)
GFR calc Af Amer: >60 mL/min (ref >60)
GFR calc non Af Amer: >60 mL/min (ref >60)
Glucose, Bld: 95 mg/dL (ref 70–99)
Potassium: 4.2 mmol/L (ref 3.5–5.1)
Sodium: 140 mmol/L (ref 135–145)
Total Bilirubin: 0.6 mg/dL (ref 0.3–1.2)
Total Protein: 7 g/dL (ref 6.5–8.1)

## 2019-01-11 MED ORDER — SODIUM CHLORIDE 0.9% FLUSH
10.0000 mL | Freq: Once | INTRAVENOUS | Status: AC
  Administered 2019-01-11: 10 mL
  Filled 2019-01-11: qty 10

## 2019-01-11 MED ORDER — HEPARIN SOD (PORK) LOCK FLUSH 100 UNIT/ML IV SOLN
500.0000 [IU] | Freq: Once | INTRAVENOUS | Status: AC
  Administered 2019-01-11: 10:00:00 500 [IU]
  Filled 2019-01-11: qty 5

## 2019-01-11 NOTE — Progress Notes (Signed)
Kathryn [name]  Telephone:([phone]) [phone] Fax:([phone]    ID: Kathryn [name] DOB: [birth year]  MR#: [id number]  URK#:[id number]  Patient Care Team: Kandace Blitz, MD as PCP - General (Obstetrics and Gynecology) Rockwell Germany, RN as Oncology Nurse Navigator Mauro Kaufmann, RN as Oncology Nurse Navigator Erroll Luna, MD as Consulting Physician (General Surgery) Magrinat, Virgie Dad, MD as Consulting Physician (Oncology) Kyung Rudd, MD as Consulting Physician (Radiation Oncology) OTHER MD: Rana Snare (OBGYN)   CHIEF COMPLAINT: Triple negative breast cancer  CURRENT TREATMENT: adjuvant radiation with Capecitabine  INTERVAL HISTORY: Kathryn [name] returns today for follow-up and treatment of her triple negative breast cancer.   She is currently undergoing adjuvant radiation therapy and is due to complete this on 01/31/2019.  She has mild swelling, but otherwise is tolerating it quite well.   She continues on Capecitabine BID on radiation days.  She tolerates this well.  REVIEW OF SYSTEMS: [name] is feeling well today.  She has had a headache when she first started radiation, and took one motrin, however this worked, and she hasn't had a repeat headache since.  She has maintained her weight and denies any new issues.  Kathryn [name] is mildly fatigued.  She is still remaining active and practicing energy conservation.  She has not been walking as much recently.  She denies any new pain, cough, fever, chills, shortness of breath, palmar/plantar erythema or peeling.  She has had no nausea, vomiting, bowel/bladder changes.  Her left great toenail did come off about a month after chemotherapy.  She puts neosporin on the area, and it is healing and she has no concerns about the area.     HISTORY OF CURRENT ILLNESS: From the original intake note:  Kathryn [name] presented with a palpable subareolar left breast lump, nipple retraction, and discharge for approximately 1 week. She underwent  bilateral diagnostic mammography with tomography and left breast ultrasonography at [facility] on 04/01/2018 showing: Breast Density Category C. An irregular hyperdense mass is seen in the left subareolar region. An additional oval, circumscribed mass is seen in the inferior central aspect at posterior depth. No additional suspicious findings are identified within the remainder of the left breast. On physical exam, there is a 2-3 cm firm, fixed lump in the subareolar region on the left. Subtle skin changes and retraction is noted along the left nipple. Targeted ultrasound is performed, showing an irregular hypoechoic mass with associated vascularity at the 12 o'clock retroareolar position on the left. Overall measurements are 2.6 x 2.3 x 1.2 cm. This corresponds with the mammographic finding. Two adjacent circumscribed hypoechoic masses are identified in the deep 6 o'clock position 2 cm from the nipple. They measure 0.8 x 0.6 x 0.4 cm and 1.3 x 1.1 x 0.7 cm. There is no seated vascularity. This corresponds with the additional mammographic finding and likely represents minimally complicated cyst. Evaluation of the left axilla demonstrates a markedly enlarged abnormal lymph node with complete hilar replacement. It measures up to 5 cm in long axis dimension. No suspicious mammographic findings on the right.   Accordingly on 04/06/2018 she proceeded to biopsy of the left breast area in question. The pathology from this procedure showed ([id number]): invasive ductal carcinoma, grade III, with lymphovascular invasion. Prognostic indicators significant for: estrogen receptor, 0% negative and progesterone receptor, 0% negative. Proliferation marker Ki67 at 70%. HER2 equivocal (2+) by immunohistochemistry, but negative by FISH (print3ed report pending).  On the same day she underwent a biopsy of the  left axillary lymph node on 04/06/2018 showing ([id number]): metastatic carcinoma in 1 of 1 lymph node (1/1).    The patient's subsequent history is as detailed below.   PAST MEDICAL HISTORY: Past Medical History:  Diagnosis Date   Breast cancer ([place])    stage 3 - left   Family history of kidney cancer    Family history of melanoma    Headache    hormone related, none since 1st child was born      PAST SURGICAL HISTORY: Past Surgical History:  Procedure Laterality Date   AXILLARY LYMPH NODE DISSECTION Left 10/11/2018   Procedure: LEFT AXILLARY LYMPH NODE DISSECTION;  Surgeon: Erroll Luna, MD;  Location: [place];  Service: General;  Laterality: Left;   BREAST LUMPECTOMY WITH RADIOACTIVE SEED AND SENTINEL LYMPH NODE BIOPSY Left 09/23/2018   Procedure: LEFT BREAST LUMPECTOMY WITH RADIOACTIVE SEED AND LEFT AXILLARY TARGETED LYMPH NODE BIOPY AND  LEFT AXILLARY SENTINEL LYMPH NODE [place];  Surgeon: Erroll Luna, MD;  Location: [place];  Service: General;  Laterality: Left;   CESAREAN SECTION     x3   PORTACATH PLACEMENT N/A 04/28/2018   Procedure: INSERTION PORT-A-CATH WITH ULTRASOUND;  Surgeon: Erroll Luna, MD;  Location: [place];  Service: General;  Laterality: N/A;     FAMILY HISTORY: Family History  Problem Relation Age of Onset   Kidney cancer Sister 53       d. 48   Lung cancer Paternal Uncle    Melanoma Sister    Melanoma Sister    Kanijah's father died from congestive heart failure at age 62. Patients' mother is 78 as of 03/2018. The patient has 1 brother and 3 sisters. Patient denies anyone in her family having breast, ovarian, prostate, or pancreatic cancer. Kathryn [name]'s sister, Kathryn [name], was diagnosed with Kidney Cancer at 33. [name] has an uncle and a cousin that were diagnosed with lung cancer, but they were both heavy smokers.    GYNECOLOGIC HISTORY:  No LMP recorded. Patient is postmenopausal. 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 Her husband, [name], owns [employer]. Together, they have three children, [name], Kathryn [name], and [place]. Eylin Pontarelli is 37, lives in [place], and works as a Theme park manager for the [employer]. Fayrene Towner is 65, lives in [place], and is a Equities trader at [employer]. Yesica Kemler is 30, is a Ship broker, and lives at home with [name] and [name].    ADVANCED DIRECTIVES: Kymberley's husband, Bryli Mantey, is automatically her healthcare power of attorney.    HEALTH MAINTENANCE: Social History   Tobacco Use   Smoking status: Never Smoker   Smokeless tobacco: Never Used  Substance Use Topics   Alcohol use: Not Currently    Comment: socially   Drug use: Never    Colonoscopy: no  PAP: 2015  Bone density: no   No Known Allergies  Current Outpatient Medications  Medication Sig Dispense Refill   capecitabine (XELODA) 500 MG tablet Take 2 tablets (1,000 mg total) by mouth 2 (two) times daily after a meal. Take on days of radiation only, M-F 132 tablet 0   docusate sodium (COLACE) 50 MG capsule Take 50 mg by mouth daily.     ibuprofen (ADVIL) 800 MG tablet Take 1 tablet (800  mg total) by mouth every 8 (eight) hours as needed. (Patient not taking: Reported on 12/14/2018) 30 tablet 0   ibuprofen (ADVIL) 800 MG tablet Take 1 tablet (800 mg total) by mouth every 8 (eight) hours as needed. (Patient not taking: Reported on 12/14/2018) 30 tablet 0   lidocaine-prilocaine (EMLA) cream Apply to affected area once (Patient not taking: Reported on 12/14/2018) 30 g 3   loratadine (CLARITIN) 10 MG tablet Take 10 mg by mouth every other day.      oxyCODONE (OXY IR/ROXICODONE) 5 MG immediate release tablet Take 1 tablet (5 mg total) by mouth every 6 (six) hours as needed for severe pain. (Patient not taking: Reported on 12/14/2018) 15  tablet 0   oxyCODONE (OXY IR/ROXICODONE) 5 MG immediate release tablet Take 1 tablet (5 mg total) by mouth every 6 (six) hours as needed for severe pain. (Patient not taking: Reported on 12/14/2018) 15 tablet 0   No current facility-administered medications for this visit.      OBJECTIVE:  Vitals:   01/11/19 1023  BP: 127/74  Pulse: 86  Resp: 18  Temp: 98 F (36.7 C)  SpO2: 100%     Body mass index is 26.09 kg/m.   Wt Readings from Last 3 Encounters:  01/11/19 152 lb (68.9 kg)  12/14/18 153 lb 9.6 oz (69.7 kg)  11/23/18 152 lb 4.8 oz (69.1 kg)  ECOG FS:1  GENERAL: Patient is a well appearing female in no acute distress HEENT:  Sclerae anicteric.  Oropharynx clear and moist. No ulcerations or evidence of oropharyngeal candidiasis. Neck is supple.  NODES:  No cervical, supraclavicular, or axillary lymphadenopathy palpated.  BREAST EXAM:  Left lumpectomy site healed, erythema noted on breast and in axilla, right breast benign LUNGS:  Clear to auscultation bilaterally.  No wheezes or rhonchi. HEART:  Regular rate and rhythm. No murmur appreciated. ABDOMEN:  Soft, nontender.  Positive, normoactive bowel sounds. No organomegaly palpated. MSK:  No focal spinal tenderness to palpation. Full range of motion bilaterally in the upper extremities. EXTREMITIES:  No peripheral edema.   SKIN:  Clear with no obvious rashes or skin changes. No nail dyscrasia. NEURO:  Nonfocal. Well oriented.  Appropriate affect.    LAB RESULTS:  CMP     Component Value Date/Time   NA 140 01/11/2019 1005   K 4.2 01/11/2019 1005   CL 106 01/11/2019 1005   CO2 23 01/11/2019 1005   GLUCOSE 95 01/11/2019 1005   BUN 16 01/11/2019 1005   CREATININE 0.74 01/11/2019 1005   CREATININE 0.62 08/02/2018 0834   CALCIUM 9.0 01/11/2019 1005   PROT 7.0 01/11/2019 1005   ALBUMIN 4.0 01/11/2019 1005   AST 18 01/11/2019 1005   AST 29 08/02/2018 0834   ALT 16 01/11/2019 1005   ALT 41 08/02/2018 0834   ALKPHOS  100 01/11/2019 1005   BILITOT 0.6 01/11/2019 1005   BILITOT 0.3 08/02/2018 0834   GFRNONAA >60 01/11/2019 1005   GFRNONAA >60 08/02/2018 0834   GFRAA >60 01/11/2019 1005   GFRAA >60 08/02/2018 0834    No results found for: TOTALPROTELP, ALBUMINELP, A1GS, A2GS, BETS, BETA2SER, GAMS, MSPIKE, SPEI  No results found for: KPAFRELGTCHN, LAMBDASER, KAPLAMBRATIO  Lab Results  Component Value Date   WBC 3.0 (L) 01/11/2019   NEUTROABS 2.0 01/11/2019   HGB 11.9 (L) 01/11/2019   HCT 36.4 01/11/2019   MCV 91.9 01/11/2019   PLT 184 01/11/2019    _0 @  No results found for: LABCA2  No components  found for: [id number]  No results for input(s): INR in the last 168 hours.  No results found for: LABCA2  No results found for: WFU932  No results found for: TFT732  No results found for: KGU542  No results found for: CA2729  No components found for: HGQUANT  No results found for: CEA1 / No results found for: CEA1   No results found for: AFPTUMOR  No results found for: CHROMOGRNA  No results found for: PSA1  Appointment on 01/11/2019  Component Date Value Ref Range Status   Sodium 01/11/2019 140  135 - 145 mmol/L Final   Potassium 01/11/2019 4.2  3.5 - 5.1 mmol/L Final   Chloride 01/11/2019 106  98 - 111 mmol/L Final   CO2 01/11/2019 23  22 - 32 mmol/L Final   Glucose, Bld 01/11/2019 95  70 - 99 mg/dL Final   BUN 01/11/2019 16  6 - 20 mg/dL Final   Creatinine, Ser 01/11/2019 0.74  0.44 - 1.00 mg/dL Final   Calcium 01/11/2019 9.0  8.9 - 10.3 mg/dL Final   Total Protein 01/11/2019 7.0  6.5 - 8.1 g/dL Final   Albumin 01/11/2019 4.0  3.5 - 5.0 g/dL Final   AST 01/11/2019 18  15 - 41 U/L Final   ALT 01/11/2019 16  0 - 44 U/L Final   Alkaline Phosphatase 01/11/2019 100  38 - 126 U/L Final   Total Bilirubin 01/11/2019 0.6  0.3 - 1.2 mg/dL Final   GFR calc non Af Amer 01/11/2019 >60  >60 mL/min Final   GFR calc Af Amer 01/11/2019 >60  >60 mL/min Final    Anion gap 01/11/2019 11  5 - 15 Final   Performed at [facility], [place] [address], [place], [place] [address]   WBC 01/11/2019 3.0* 4.0 - 10.5 K/uL Final   RBC 01/11/2019 3.96  3.87 - 5.11 MIL/uL Final   Hemoglobin 01/11/2019 11.9* 12.0 - 15.0 g/dL Final   HCT 01/11/2019 36.4  36.0 - 46.0 % Final   MCV 01/11/2019 91.9  80.0 - 100.0 fL Final   MCH 01/11/2019 30.1  26.0 - 34.0 pg Final   MCHC 01/11/2019 32.7  30.0 - 36.0 g/dL Final   RDW 01/11/2019 16.1* 11.5 - 15.5 % Final   Platelets 01/11/2019 184  150 - 400 K/uL Final   nRBC 01/11/2019 0.0  0.0 - 0.2 % Final   Neutrophils Relative % 01/11/2019 66  % Final   Neutro Abs 01/11/2019 2.0  1.7 - 7.7 K/uL Final   Lymphocytes Relative 01/11/2019 19  % Final   Lymphs Abs 01/11/2019 0.6* 0.7 - 4.0 K/uL Final   Monocytes Relative 01/11/2019 13  % Final   Monocytes Absolute 01/11/2019 0.4  0.1 - 1.0 K/uL Final   Eosinophils Relative 01/11/2019 1  % Final   Eosinophils Absolute 01/11/2019 0.0  0.0 - 0.5 K/uL Final   Basophils Relative 01/11/2019 1  % Final   Basophils Absolute 01/11/2019 0.0  0.0 - 0.1 K/uL Final   Immature Granulocytes 01/11/2019 0  % Final   Abs Immature Granulocytes 01/11/2019 0.01  0.00 - 0.07 K/uL Final   Performed at [facility], [place] [address] [place], [place] [address]    (this displays the last labs from the last 3 days)  No results found for: TOTALPROTELP, ALBUMINELP, A1GS, A2GS, BETS, BETA2SER, GAMS, MSPIKE, SPEI (this displays SPEP labs)  No results found for: KPAFRELGTCHN, LAMBDASER, KAPLAMBRATIO (kappa/lambda light chains)  No results found  for: HGBA, HGBA2QUANT, HGBFQUANT, HGBSQUAN (Hemoglobinopathy evaluation)   No results found for: LDH  No results found for: IRON, TIBC, IRONPCTSAT (Iron and TIBC)  No results found for: FERRITIN  Urinalysis No results found for: COLORURINE, APPEARANCEUR, LABSPEC, PHURINE, GLUCOSEU, HGBUR,  BILIRUBINUR, KETONESUR, PROTEINUR, UROBILINOGEN, NITRITE, LEUKOCYTESUR   STUDIES:  No results found.  ELIGIBLE FOR AVAILABLE RESEARCH PROTOCOL: [id number]   ASSESSMENT: 58 y.o. [place], [place] woman status post left breast upper outer quadrant biopsy 04/06/2018 for a clinical T2 N2, stage IIIC invasive ductal carcinoma, grade 3, triple negative, with an MIB-1 of 70%  (a) breast MRI 04/19/2018 shows a 5 cm central breast lesion with multiple satellite nodules and greater then 3 abnormal axillary lymph nodes  (b) baseline echocardiogram 04/25/2018 shows an ejection fraction in the 60-65% range  (c) chest CT scan and bone scan showed no metastatic disease.  Nonspecific rib changes will need to be followed  (1) neoadjuvant chemotherapy consisting of doxorubicin and cyclophosphamide given every 21 days x4, starting 05/03/2018, completed 06/28/2018, to be followed by weekly paclitaxel and carboplatin x12 starting 07/20/2018   (a) Doxorubicin and Cyclophosphamide dose reduced for cycles 3 and 4 due to neutropenia  (b) dose dense changed to every 3 weeks for doxorubicin/cyclophosphamide secondary to cytopenias and side effects  (2) left lumpectomy targeted axillary lymph node sampling 09/23/2018 showed a residual ypT1b ypN1 invasive ductal carcinoma, grade 2, again triple negative  (a) completion axillary dissection 10/11/2018 showed an additional 7 lymph nodes all negative for carcinoma  (3) adjuvant radiation beginning 12/14/2018 with capecitabine sensitization  (4) continue capecitabine at full dose to complete a total of 6 months  (5) genetics testing 04/27/2018: No pathogenic mutations.The Multi-Gene Panel offered by [facility] includes sequencing and/or deletion duplication testing of the following 85 genes: AIP, ALK, APC, ATM, AXIN2,BAP1,  BARD1, BLM, BMPR1A, BRCA1, BRCA2, BRIP1, CASR, CDC73, CDH1, CDK4, CDKN1B, CDKN1C, CDKN2A (p14ARF), CDKN2A (p16INK4a), CEBPA, CHEK2, CTNNA1, DICER1, DIS3L2, EGFR  (c.2369C>T, p.Thr790Met variant only), EPCAM (Deletion/duplication testing only), FH, FLCN, GATA2, GPC3, GREM1 (Promoter region deletion/duplication testing only), HOXB13 (c.251G>A, p.Gly84Glu), HRAS, KIT, MAX, MEN1, MET, MITF (c.952G>A, p.Glu318Lys variant only), MLH1, MSH2, MSH3, MSH6, MUTYH, NBN, NF1, NF2, NTHL1, PALB2, PDGFRA, PHOX2B, PMS2, POLD1, POLE, POT1, PRKAR1A, PTCH1, PTEN, RAD50, RAD51C, RAD51D, RB1, RECQL4, RET, RNF43, RUNX1, SDHAF2, SDHA (sequence changes only), SDHB, SDHC, SDHD, SMAD4, SMARCA4, SMARCB1, SMARCE1, STK11, SUFU, TERC, TERT, TMEM127, TP53, TSC1, TSC2, VHL, WRN and WT1.   (6) consider H7026  (7) [place] requested 10/20/2018  PLAN: Kathaleya is doing well today.  Her labs are stable and she continues to undergo radiation therapy with Capecitabine sensitization with good tolerance.  She will continue to take the Capecitabine on radiation days.  I reviewed her CBC with her in detail and it is stable. I recommended continued healthy diet and exercise and applauded her on being able to keep her weight stable while undergoing treatment, that certainly can be challenging.  Amarionna will return in 4 weeks for labs and follow up with Dr. Jana Hakim to discuss adjuvant capecitabine.    She was recommended to continue with the appropriate pandemic precautions. She knows to call for any questions that may arise between now and her next appointment.  We are happy to see her sooner if needed.  A total of (20) minutes of face-to-face time was spent with this patient with greater than 50% of that time in counseling and care-coordination.  Wilber Bihari, NP Medical Oncology and Hematology [facility] [address]  [place], [place] [address] Tel. [phone]    Fax. [phone]

## 2019-01-11 NOTE — Telephone Encounter (Signed)
I talk with patient regarding schedule  

## 2019-01-12 ENCOUNTER — Ambulatory Visit: Payer: BC Managed Care – PPO

## 2019-01-12 ENCOUNTER — Ambulatory Visit
Admission: RE | Admit: 2019-01-12 | Discharge: 2019-01-12 | Disposition: A | Discharge status: Home / Self Care | Payer: BC Managed Care – PPO | Source: Ambulatory Visit | Attending: Radiation Oncology | Admitting: Radiation Oncology

## 2019-01-12 ENCOUNTER — Other Ambulatory Visit: Payer: Self-pay

## 2019-01-12 DIAGNOSIS — Z510 Encounter for antineoplastic radiation therapy: Secondary | ICD-10-CM | POA: Diagnosis not present

## 2019-01-12 DIAGNOSIS — Z171 Estrogen receptor negative status [ER-]: Secondary | ICD-10-CM | POA: Diagnosis not present

## 2019-01-12 DIAGNOSIS — C50412 Malignant neoplasm of upper-outer quadrant of left female breast: Secondary | ICD-10-CM | POA: Diagnosis not present

## 2019-01-13 ENCOUNTER — Ambulatory Visit
Admission: RE | Admit: 2019-01-13 | Discharge: 2019-01-13 | Disposition: A | Discharge status: Home / Self Care | Payer: BC Managed Care – PPO | Source: Ambulatory Visit | Attending: Radiation Oncology | Admitting: Radiation Oncology

## 2019-01-13 ENCOUNTER — Ambulatory Visit: Payer: BC Managed Care – PPO

## 2019-01-13 ENCOUNTER — Other Ambulatory Visit: Payer: Self-pay

## 2019-01-13 DIAGNOSIS — Z510 Encounter for antineoplastic radiation therapy: Secondary | ICD-10-CM | POA: Diagnosis not present

## 2019-01-13 DIAGNOSIS — Z171 Estrogen receptor negative status [ER-]: Secondary | ICD-10-CM | POA: Diagnosis not present

## 2019-01-13 DIAGNOSIS — C50412 Malignant neoplasm of upper-outer quadrant of left female breast: Secondary | ICD-10-CM | POA: Diagnosis not present

## 2019-01-16 ENCOUNTER — Ambulatory Visit
Admission: RE | Admit: 2019-01-16 | Discharge: 2019-01-16 | Disposition: A | Discharge status: Home / Self Care | Payer: BC Managed Care – PPO | Source: Ambulatory Visit | Attending: Radiation Oncology | Admitting: Radiation Oncology

## 2019-01-16 ENCOUNTER — Ambulatory Visit: Payer: BC Managed Care – PPO

## 2019-01-16 ENCOUNTER — Other Ambulatory Visit: Payer: Self-pay

## 2019-01-16 ENCOUNTER — Other Ambulatory Visit: Payer: Self-pay | Admitting: Oncology

## 2019-01-16 DIAGNOSIS — Z510 Encounter for antineoplastic radiation therapy: Secondary | ICD-10-CM | POA: Diagnosis not present

## 2019-01-16 DIAGNOSIS — Z171 Estrogen receptor negative status [ER-]: Secondary | ICD-10-CM

## 2019-01-16 DIAGNOSIS — C50412 Malignant neoplasm of upper-outer quadrant of left female breast: Secondary | ICD-10-CM | POA: Diagnosis not present

## 2019-01-17 ENCOUNTER — Ambulatory Visit
Admission: RE | Admit: 2019-01-17 | Discharge: 2019-01-17 | Disposition: A | Discharge status: Home / Self Care | Payer: BC Managed Care – PPO | Source: Ambulatory Visit | Attending: Radiation Oncology | Admitting: Radiation Oncology

## 2019-01-17 ENCOUNTER — Other Ambulatory Visit: Payer: Self-pay

## 2019-01-17 ENCOUNTER — Ambulatory Visit: Payer: BC Managed Care – PPO

## 2019-01-17 DIAGNOSIS — Z171 Estrogen receptor negative status [ER-]: Secondary | ICD-10-CM | POA: Diagnosis not present

## 2019-01-17 DIAGNOSIS — C50412 Malignant neoplasm of upper-outer quadrant of left female breast: Secondary | ICD-10-CM | POA: Diagnosis not present

## 2019-01-17 DIAGNOSIS — Z510 Encounter for antineoplastic radiation therapy: Secondary | ICD-10-CM | POA: Diagnosis not present

## 2019-01-18 ENCOUNTER — Ambulatory Visit
Admission: RE | Admit: 2019-01-18 | Discharge: 2019-01-18 | Disposition: A | Discharge status: Home / Self Care | Payer: BC Managed Care – PPO | Source: Ambulatory Visit | Attending: Radiation Oncology | Admitting: Radiation Oncology

## 2019-01-18 ENCOUNTER — Other Ambulatory Visit: Payer: Self-pay

## 2019-01-18 ENCOUNTER — Ambulatory Visit: Payer: BC Managed Care – PPO

## 2019-01-18 DIAGNOSIS — Z510 Encounter for antineoplastic radiation therapy: Secondary | ICD-10-CM | POA: Diagnosis not present

## 2019-01-18 DIAGNOSIS — C50412 Malignant neoplasm of upper-outer quadrant of left female breast: Secondary | ICD-10-CM | POA: Diagnosis not present

## 2019-01-18 DIAGNOSIS — Z171 Estrogen receptor negative status [ER-]: Secondary | ICD-10-CM | POA: Diagnosis not present

## 2019-01-20 MED FILL — CAPECITABINE 500 MG TABS: 500 | 14 days supply | Qty: 44 | Fill #0

## 2019-01-23 ENCOUNTER — Other Ambulatory Visit: Payer: Self-pay | Admitting: Physician Assistant

## 2019-01-23 ENCOUNTER — Ambulatory Visit: Payer: BC Managed Care – PPO | Admitting: Radiation Oncology

## 2019-01-23 ENCOUNTER — Inpatient Hospital Stay: Admission: RE | Admit: 2019-01-23 | Payer: BC Managed Care – PPO | Source: Ambulatory Visit

## 2019-01-23 ENCOUNTER — Other Ambulatory Visit: Payer: Self-pay | Admitting: Orthopedic Surgery

## 2019-01-23 ENCOUNTER — Ambulatory Visit: Payer: BC Managed Care – PPO

## 2019-01-23 ENCOUNTER — Other Ambulatory Visit: Payer: Self-pay

## 2019-01-23 ENCOUNTER — Ambulatory Visit
Admission: RE | Admit: 2019-01-23 | Discharge: 2019-01-23 | Disposition: A | Discharge status: Home / Self Care | Payer: BC Managed Care – PPO | Source: Ambulatory Visit | Attending: Radiation Oncology | Admitting: Radiation Oncology

## 2019-01-23 DIAGNOSIS — Z510 Encounter for antineoplastic radiation therapy: Secondary | ICD-10-CM | POA: Diagnosis not present

## 2019-01-23 DIAGNOSIS — M25511 Pain in right shoulder: Secondary | ICD-10-CM

## 2019-01-23 DIAGNOSIS — Z171 Estrogen receptor negative status [ER-]: Secondary | ICD-10-CM | POA: Diagnosis not present

## 2019-01-23 DIAGNOSIS — C50412 Malignant neoplasm of upper-outer quadrant of left female breast: Secondary | ICD-10-CM | POA: Diagnosis not present

## 2019-01-23 DIAGNOSIS — S42201A Unspecified fracture of upper end of right humerus, initial encounter for closed fracture: Secondary | ICD-10-CM | POA: Insufficient documentation

## 2019-01-24 ENCOUNTER — Other Ambulatory Visit: Payer: BC Managed Care – PPO

## 2019-01-24 ENCOUNTER — Ambulatory Visit
Admission: RE | Admit: 2019-01-24 | Discharge: 2019-01-24 | Disposition: A | Discharge status: Home / Self Care | Payer: BC Managed Care – PPO | Source: Ambulatory Visit | Attending: Physician Assistant | Admitting: Physician Assistant

## 2019-01-24 ENCOUNTER — Ambulatory Visit: Payer: BC Managed Care – PPO

## 2019-01-24 ENCOUNTER — Ambulatory Visit
Admission: RE | Admit: 2019-01-24 | Discharge: 2019-01-24 | Disposition: A | Discharge status: Home / Self Care | Payer: BC Managed Care – PPO | Source: Ambulatory Visit | Attending: Radiation Oncology | Admitting: Radiation Oncology

## 2019-01-24 DIAGNOSIS — Z171 Estrogen receptor negative status [ER-]: Secondary | ICD-10-CM | POA: Insufficient documentation

## 2019-01-24 DIAGNOSIS — M25511 Pain in right shoulder: Secondary | ICD-10-CM

## 2019-01-24 DIAGNOSIS — S42211A Unspecified displaced fracture of surgical neck of right humerus, initial encounter for closed fracture: Secondary | ICD-10-CM | POA: Diagnosis not present

## 2019-01-24 DIAGNOSIS — C50412 Malignant neoplasm of upper-outer quadrant of left female breast: Secondary | ICD-10-CM | POA: Diagnosis not present

## 2019-01-24 DIAGNOSIS — Z510 Encounter for antineoplastic radiation therapy: Secondary | ICD-10-CM | POA: Diagnosis not present

## 2019-01-24 DIAGNOSIS — R937 Abnormal findings on diagnostic imaging of other parts of musculoskeletal system: Secondary | ICD-10-CM | POA: Diagnosis not present

## 2019-01-24 DIAGNOSIS — S42291A Other displaced fracture of upper end of right humerus, initial encounter for closed fracture: Secondary | ICD-10-CM | POA: Diagnosis not present

## 2019-01-25 ENCOUNTER — Other Ambulatory Visit: Payer: Self-pay | Admitting: Oncology

## 2019-01-25 ENCOUNTER — Encounter: Payer: Self-pay | Admitting: Oncology

## 2019-01-25 ENCOUNTER — Other Ambulatory Visit: Payer: Self-pay

## 2019-01-25 ENCOUNTER — Ambulatory Visit: Payer: BC Managed Care – PPO

## 2019-01-25 ENCOUNTER — Ambulatory Visit
Admission: RE | Admit: 2019-01-25 | Discharge: 2019-01-25 | Disposition: A | Discharge status: Home / Self Care | Payer: BC Managed Care – PPO | Source: Ambulatory Visit | Attending: Radiation Oncology | Admitting: Radiation Oncology

## 2019-01-25 ENCOUNTER — Telehealth: Payer: Self-pay | Admitting: Oncology

## 2019-01-25 DIAGNOSIS — C50412 Malignant neoplasm of upper-outer quadrant of left female breast: Secondary | ICD-10-CM | POA: Diagnosis not present

## 2019-01-25 DIAGNOSIS — Z171 Estrogen receptor negative status [ER-]: Secondary | ICD-10-CM | POA: Diagnosis not present

## 2019-01-25 DIAGNOSIS — Z510 Encounter for antineoplastic radiation therapy: Secondary | ICD-10-CM | POA: Diagnosis not present

## 2019-01-25 DIAGNOSIS — S42201D Unspecified fracture of upper end of right humerus, subsequent encounter for fracture with routine healing: Secondary | ICD-10-CM | POA: Diagnosis not present

## 2019-01-25 NOTE — Progress Notes (Signed)
I was called today by Kathryn [name]'s orthopedist, Kathryn [name] is supple, who tells me she has fallen and broken her right shoulder.  She needs surgery to repair this and this is scheduled for 01/31/2019.  She is receiving radiation to the left chest wall area.  She is scheduled for radiation through 02/01/2019.  I have sent to Dr. Lisbeth Renshaw and his 22 assistant note to try to see if they can work this out.  I gave them Dr. Augustin Coupe number.  I also set Kathryn [name] up for labs since she is taking capecitabine for sensitization and she might need to discontinue that.

## 2019-01-25 NOTE — Telephone Encounter (Signed)
Added lab 12/3 per 12/2 schedule message. Left message for patient.

## 2019-01-26 ENCOUNTER — Ambulatory Visit
Admission: RE | Admit: 2019-01-26 | Discharge: 2019-01-26 | Disposition: A | Discharge status: Home / Self Care | Payer: BC Managed Care – PPO | Source: Ambulatory Visit | Attending: Radiation Oncology | Admitting: Radiation Oncology

## 2019-01-26 ENCOUNTER — Other Ambulatory Visit: Payer: Self-pay

## 2019-01-26 ENCOUNTER — Inpatient Hospital Stay: Payer: BC Managed Care – PPO | Attending: Oncology

## 2019-01-26 ENCOUNTER — Ambulatory Visit: Payer: BC Managed Care – PPO

## 2019-01-26 DIAGNOSIS — Z171 Estrogen receptor negative status [ER-]: Secondary | ICD-10-CM | POA: Diagnosis not present

## 2019-01-26 DIAGNOSIS — Z8249 Family history of ischemic heart disease and other diseases of the circulatory system: Secondary | ICD-10-CM | POA: Insufficient documentation

## 2019-01-26 DIAGNOSIS — Z808 Family history of malignant neoplasm of other organs or systems: Secondary | ICD-10-CM | POA: Diagnosis not present

## 2019-01-26 DIAGNOSIS — Z510 Encounter for antineoplastic radiation therapy: Secondary | ICD-10-CM | POA: Diagnosis not present

## 2019-01-26 DIAGNOSIS — Z79899 Other long term (current) drug therapy: Secondary | ICD-10-CM | POA: Diagnosis not present

## 2019-01-26 DIAGNOSIS — Z9221 Personal history of antineoplastic chemotherapy: Secondary | ICD-10-CM | POA: Diagnosis not present

## 2019-01-26 DIAGNOSIS — Z8051 Family history of malignant neoplasm of kidney: Secondary | ICD-10-CM | POA: Insufficient documentation

## 2019-01-26 DIAGNOSIS — Z452 Encounter for adjustment and management of vascular access device: Secondary | ICD-10-CM | POA: Diagnosis not present

## 2019-01-26 DIAGNOSIS — Z801 Family history of malignant neoplasm of trachea, bronchus and lung: Secondary | ICD-10-CM | POA: Diagnosis not present

## 2019-01-26 DIAGNOSIS — Z923 Personal history of irradiation: Secondary | ICD-10-CM | POA: Diagnosis not present

## 2019-01-26 DIAGNOSIS — C50412 Malignant neoplasm of upper-outer quadrant of left female breast: Secondary | ICD-10-CM | POA: Diagnosis not present

## 2019-01-26 DIAGNOSIS — Z791 Long term (current) use of non-steroidal anti-inflammatories (NSAID): Secondary | ICD-10-CM | POA: Insufficient documentation

## 2019-01-26 LAB — CBC WITH DIFFERENTIAL/PLATELET
Abs Immature Granulocytes: 0.01 10*3/uL (ref 0.00–0.07)
Basophils Absolute: 0 10*3/uL (ref 0.0–0.1)
Basophils Relative: 1 %
Eosinophils Absolute: 0.1 10*3/uL (ref 0.0–0.5)
Eosinophils Relative: 2 %
HCT: 30.6 % — ABNORMAL LOW (ref 36.0–46.0)
Hemoglobin: 9.8 g/dL — ABNORMAL LOW (ref 12.0–15.0)
Immature Granulocytes: 0 %
Lymphocytes Relative: 15 %
Lymphs Abs: 0.6 10*3/uL — ABNORMAL LOW (ref 0.7–4.0)
MCH: 31.2 pg (ref 26.0–34.0)
MCHC: 32 g/dL (ref 30.0–36.0)
MCV: 97.5 fL (ref 80.0–100.0)
Monocytes Absolute: 0.5 10*3/uL (ref 0.1–1.0)
Monocytes Relative: 13 %
Neutro Abs: 2.5 10*3/uL (ref 1.7–7.7)
Neutrophils Relative %: 69 %
Platelets: 134 10*3/uL — ABNORMAL LOW (ref 150–400)
RBC: 3.14 MIL/uL — ABNORMAL LOW (ref 3.87–5.11)
RDW: 18.3 % — ABNORMAL HIGH (ref 11.5–15.5)
WBC: 3.6 10*3/uL — ABNORMAL LOW (ref 4.0–10.5)
nRBC: 0 % (ref 0.0–0.2)

## 2019-01-26 LAB — COMPREHENSIVE METABOLIC PANEL
ALT: 42 U/L (ref 0–44)
AST: 39 U/L (ref 15–41)
Albumin: 3.6 g/dL (ref 3.5–5.0)
Alkaline Phosphatase: 88 U/L (ref 38–126)
Anion gap: 9 (ref 5–15)
BUN: 14 mg/dL (ref 6–20)
CO2: 25 mmol/L (ref 22–32)
Calcium: 8.9 mg/dL (ref 8.9–10.3)
Chloride: 106 mmol/L (ref 98–111)
Creatinine, Ser: 0.75 mg/dL (ref 0.44–1.00)
GFR calc Af Amer: >60 mL/min (ref >60)
GFR calc non Af Amer: >60 mL/min (ref >60)
Glucose, Bld: 95 mg/dL (ref 70–99)
Potassium: 4 mmol/L (ref 3.5–5.1)
Sodium: 140 mmol/L (ref 135–145)
Total Bilirubin: 0.8 mg/dL (ref 0.3–1.2)
Total Protein: 6.3 g/dL — ABNORMAL LOW (ref 6.5–8.1)

## 2019-01-27 ENCOUNTER — Ambulatory Visit: Payer: BC Managed Care – PPO

## 2019-01-27 ENCOUNTER — Ambulatory Visit: Payer: BC Managed Care – PPO | Admitting: Radiation Oncology

## 2019-01-27 ENCOUNTER — Ambulatory Visit
Admission: RE | Admit: 2019-01-27 | Discharge: 2019-01-27 | Disposition: A | Discharge status: Home / Self Care | Payer: BC Managed Care – PPO | Source: Ambulatory Visit | Attending: Radiation Oncology | Admitting: Radiation Oncology

## 2019-01-27 ENCOUNTER — Other Ambulatory Visit (HOSPITAL_COMMUNITY)
Admission: RE | Admit: 2019-01-27 | Discharge: 2019-01-27 | Disposition: A | Discharge status: Home / Self Care | Payer: BC Managed Care – PPO | Source: Ambulatory Visit | Attending: Orthopedic Surgery | Admitting: Orthopedic Surgery

## 2019-01-27 ENCOUNTER — Other Ambulatory Visit: Payer: Self-pay

## 2019-01-27 DIAGNOSIS — Z171 Estrogen receptor negative status [ER-]: Secondary | ICD-10-CM

## 2019-01-27 DIAGNOSIS — Z510 Encounter for antineoplastic radiation therapy: Secondary | ICD-10-CM | POA: Diagnosis not present

## 2019-01-27 DIAGNOSIS — Z01812 Encounter for preprocedural laboratory examination: Secondary | ICD-10-CM | POA: Diagnosis not present

## 2019-01-27 DIAGNOSIS — C50412 Malignant neoplasm of upper-outer quadrant of left female breast: Secondary | ICD-10-CM | POA: Diagnosis not present

## 2019-01-27 DIAGNOSIS — Z20828 Contact with and (suspected) exposure to other viral communicable diseases: Secondary | ICD-10-CM | POA: Diagnosis not present

## 2019-01-27 MED ORDER — SONAFINE EX EMUL
1.0000 "application " | Freq: Two times a day (BID) | CUTANEOUS | Status: DC
  Administered 2019-01-27: 1 via TOPICAL

## 2019-01-27 NOTE — Progress Notes (Signed)
PCP - Kandace Blitz Cardiologist -   Chest x-ray - I view 04-28-18 epic EKG - 08-31-18 epic Stress Test -  ECHO - 04-25-18  Cardiac Cath -  CBC and CMP 01-26-19 epic   Sleep Study -  CPAP -   Fasting Blood Sugar -  Checks Blood Sugar _____ times a day  Blood Thinner Instructions: Aspirin Instructions: Last Dose:  Anesthesia review: Stopped xeloda 01-27-19  Patient denies shortness of breath, fever, cough and chest pain at PAT appointment   NONE   Patient verbalized understanding of instructions that were given to them at the PAT appointment. Patient was also instructed that they will need to review over the PAT instructions again at home before surgery.

## 2019-01-27 NOTE — Patient Instructions (Addendum)
DUE TO COVID-19 ONLY ONE VISITOR IS ALLOWED TO COME WITH YOU AND STAY IN THE WAITING ROOM ONLY DURING PRE OP AND PROCEDURE DAY OF SURGERY. THE 1 VISITOR MAY VISIT WITH YOU AFTER SURGERY IN YOUR PRIVATE ROOM DURING VISITING HOURS ONLY!  YOU NEED TO HAVE A COVID 19 TEST ON_______ @_______ , THIS TEST MUST BE DONE BEFORE SURGERY, COME  [place], [place] , [id number].  ([place]) ONCE YOUR COVID TEST IS COMPLETED, PLEASE BEGIN THE QUARANTINE INSTRUCTIONS AS OUTLINED IN YOUR HANDOUT.                MEG BOURLIER  01/27/2019   Your procedure is scheduled on: 01-31-19   Report to [facility] Main  Entrance   Report to admitting at    1145  PM     Call this number if you have problems the morning of surgery [phone]    Remember: NO SOLID FOOD AFTER MIDNIGHT THE NIGHT PRIOR TO SURGERY. NOTHING BY MOUTH EXCEPT CLEAR LIQUIDS UNTIL    1045  am  . PLEASE FINISH ENSURE DRINK PER SURGEON ORDER  WHICH NEEDS TO BE COMPLETED AT     1045  am then nothing by mouth .    CLEAR LIQUID DIET   Foods Allowed                                                                     Foods Excluded  Coffee and tea, regular and decaf                             liquids that you cannot  Plain Jell-O any favor except red or purple                                           see through such as: Fruit ices (not with fruit pulp)                                     milk, soups, orange juice  Iced Popsicles                                    All solid food Carbonated beverages, regular and diet                                    Cranberry, grape and apple juices Sports drinks like Gatorade Lightly seasoned clear broth or consume(fat free) Sugar, honey syrup   _____________________________________________________________________  BRUSH YOUR TEETH MORNING OF SURGERY AND RINSE YOUR MOUTH OUT, NO CHEWING GUM CANDY OR MINTS.     Take these medicines the morning of surgery with A SIP  OF WATER: oxycodone if needed, xeloda  You may not have any metal on your body including hair pins and              piercings  Do not wear jewelry, make-up, lotions, powders or perfumes, deodorant             Do not wear nail polish on your fingernails.  Do not shave  48 hours prior to surgery.               Do not bring valuables to the hospital. [place].  Contacts, dentures or bridgework may not be worn into surgery.                 Please read over the following fact sheets you were given: ____________________________________________________________________           [facility] - Preparing for Surgery Before surgery, you can play an important role.  Because skin is not sterile, your skin needs to be as free of germs as possible.  You can reduce the number of germs on your skin by washing with CHG (chlorahexidine gluconate) soap before surgery.  CHG is an antiseptic cleaner which kills germs and bonds with the skin to continue killing germs even after washing. Please DO NOT use if you have an allergy to CHG or antibacterial soaps.  If your skin becomes reddened/irritated stop using the CHG and inform your nurse when you arrive at Short Stay. Do not shave (including legs and underarms) for at least 48 hours prior to the first CHG shower.  You may shave your face/neck. Please follow these instructions carefully:  1.  Shower with CHG Soap the night before surgery and the  morning of Surgery.  2.  If you choose to wash your hair, wash your hair first as usual with your  normal  shampoo.  3.  After you shampoo, rinse your hair and body thoroughly to remove the  shampoo.                           4.  Use CHG as you would any other liquid soap.  You can apply chg directly  to the skin and wash                       Gently with a scrungie or clean washcloth.  5.  Apply the CHG Soap to your body ONLY FROM THE NECK  DOWN.   Do not use on face/ open                           Wound or open sores. Avoid contact with eyes, ears mouth and genitals (private parts).                       Wash face,  Genitals (private parts) with your normal soap.             6.  Wash thoroughly, paying special attention to the area where your surgery  will be performed.  7.  Thoroughly rinse your body with warm water from the neck down.  8.  DO NOT shower/wash with your normal soap after using and rinsing off  the CHG Soap.  9.  Pat yourself dry with a clean towel.            10.  Wear clean pajamas.            11.  Place clean sheets on your bed the night of your first shower and do not  sleep with pets. Day of Surgery : Do not apply any lotions/deodorants the morning of surgery.  Please wear clean clothes to the hospital/surgery center.  FAILURE TO FOLLOW THESE INSTRUCTIONS MAY RESULT IN THE CANCELLATION OF YOUR SURGERY PATIENT SIGNATURE_________________________________  NURSE SIGNATURE__________________________________  ________________________________________________________________________

## 2019-01-28 LAB — NOVEL CORONAVIRUS, NAA (HOSP ORDER, SEND-OUT TO REF LAB; TAT 18-24 HRS): SARS-CoV-2, NAA: NOT DETECTED

## 2019-01-30 ENCOUNTER — Ambulatory Visit: Payer: BC Managed Care – PPO

## 2019-01-30 ENCOUNTER — Ambulatory Visit
Admission: RE | Admit: 2019-01-30 | Discharge: 2019-01-30 | Disposition: A | Discharge status: Home / Self Care | Payer: BC Managed Care – PPO | Source: Ambulatory Visit | Attending: Radiation Oncology | Admitting: Radiation Oncology

## 2019-01-30 ENCOUNTER — Encounter (HOSPITAL_COMMUNITY): Payer: Self-pay

## 2019-01-30 ENCOUNTER — Other Ambulatory Visit: Payer: Self-pay

## 2019-01-30 ENCOUNTER — Encounter (HOSPITAL_COMMUNITY)
Admission: RE | Admit: 2019-01-30 | Discharge: 2019-01-30 | Disposition: A | Discharge status: Home / Self Care | Payer: BC Managed Care – PPO | Source: Ambulatory Visit | Attending: Orthopedic Surgery | Admitting: Orthopedic Surgery

## 2019-01-30 DIAGNOSIS — C50412 Malignant neoplasm of upper-outer quadrant of left female breast: Secondary | ICD-10-CM | POA: Diagnosis not present

## 2019-01-30 DIAGNOSIS — S42201A Unspecified fracture of upper end of right humerus, initial encounter for closed fracture: Secondary | ICD-10-CM | POA: Insufficient documentation

## 2019-01-30 DIAGNOSIS — Z171 Estrogen receptor negative status [ER-]: Secondary | ICD-10-CM | POA: Diagnosis not present

## 2019-01-30 DIAGNOSIS — Z510 Encounter for antineoplastic radiation therapy: Secondary | ICD-10-CM | POA: Diagnosis not present

## 2019-01-30 DIAGNOSIS — Z01818 Encounter for other preprocedural examination: Secondary | ICD-10-CM | POA: Insufficient documentation

## 2019-01-30 HISTORY — DX: Pleurisy: R09.1

## 2019-01-31 ENCOUNTER — Ambulatory Visit (HOSPITAL_COMMUNITY): Payer: BC Managed Care – PPO | Admitting: Physician Assistant

## 2019-01-31 ENCOUNTER — Encounter (HOSPITAL_COMMUNITY)
Admission: RE | Disposition: A | Discharge status: Home / Self Care | Payer: Self-pay | Source: Home / Self Care | Attending: Orthopedic Surgery

## 2019-01-31 ENCOUNTER — Encounter (HOSPITAL_COMMUNITY): Payer: Self-pay | Admitting: Emergency Medicine

## 2019-01-31 ENCOUNTER — Encounter: Payer: Self-pay | Admitting: *Deleted

## 2019-01-31 ENCOUNTER — Ambulatory Visit
Admission: RE | Admit: 2019-01-31 | Discharge: 2019-01-31 | Disposition: A | Discharge status: Home / Self Care | Payer: BC Managed Care – PPO | Source: Ambulatory Visit | Attending: Radiation Oncology | Admitting: Radiation Oncology

## 2019-01-31 ENCOUNTER — Ambulatory Visit (HOSPITAL_COMMUNITY)
Admission: RE | Admit: 2019-01-31 | Discharge: 2019-01-31 | Disposition: A | Discharge status: Home / Self Care | Payer: BC Managed Care – PPO | Attending: Orthopedic Surgery | Admitting: Orthopedic Surgery

## 2019-01-31 ENCOUNTER — Ambulatory Visit: Payer: BC Managed Care – PPO

## 2019-01-31 ENCOUNTER — Ambulatory Visit (HOSPITAL_COMMUNITY): Payer: BC Managed Care – PPO | Admitting: Anesthesiology

## 2019-01-31 ENCOUNTER — Ambulatory Visit (HOSPITAL_COMMUNITY): Payer: BC Managed Care – PPO

## 2019-01-31 ENCOUNTER — Encounter: Payer: Self-pay | Admitting: Radiation Oncology

## 2019-01-31 ENCOUNTER — Other Ambulatory Visit: Payer: Self-pay

## 2019-01-31 ENCOUNTER — Telehealth: Payer: Self-pay | Admitting: Adult Health

## 2019-01-31 DIAGNOSIS — W1830XA Fall on same level, unspecified, initial encounter: Secondary | ICD-10-CM | POA: Insufficient documentation

## 2019-01-31 DIAGNOSIS — S42231A 3-part fracture of surgical neck of right humerus, initial encounter for closed fracture: Secondary | ICD-10-CM | POA: Diagnosis not present

## 2019-01-31 DIAGNOSIS — Z79899 Other long term (current) drug therapy: Secondary | ICD-10-CM | POA: Insufficient documentation

## 2019-01-31 DIAGNOSIS — Z171 Estrogen receptor negative status [ER-]: Secondary | ICD-10-CM | POA: Diagnosis not present

## 2019-01-31 DIAGNOSIS — S42291A Other displaced fracture of upper end of right humerus, initial encounter for closed fracture: Secondary | ICD-10-CM | POA: Diagnosis not present

## 2019-01-31 DIAGNOSIS — Z419 Encounter for procedure for purposes other than remedying health state, unspecified: Secondary | ICD-10-CM

## 2019-01-31 DIAGNOSIS — C50912 Malignant neoplasm of unspecified site of left female breast: Secondary | ICD-10-CM | POA: Diagnosis not present

## 2019-01-31 DIAGNOSIS — S42201A Unspecified fracture of upper end of right humerus, initial encounter for closed fracture: Secondary | ICD-10-CM | POA: Diagnosis not present

## 2019-01-31 DIAGNOSIS — S42201D Unspecified fracture of upper end of right humerus, subsequent encounter for fracture with routine healing: Secondary | ICD-10-CM

## 2019-01-31 DIAGNOSIS — G8918 Other acute postprocedural pain: Secondary | ICD-10-CM | POA: Diagnosis not present

## 2019-01-31 DIAGNOSIS — C50412 Malignant neoplasm of upper-outer quadrant of left female breast: Secondary | ICD-10-CM | POA: Diagnosis not present

## 2019-01-31 DIAGNOSIS — Z510 Encounter for antineoplastic radiation therapy: Secondary | ICD-10-CM | POA: Diagnosis not present

## 2019-01-31 HISTORY — PX: ORIF HUMERUS FRACTURE: SHX2126

## 2019-01-31 SURGERY — OPEN REDUCTION INTERNAL FIXATION (ORIF) PROXIMAL HUMERUS FRACTURE
Anesthesia: General | Site: Shoulder | Laterality: Right

## 2019-01-31 MED ORDER — SUGAMMADEX SODIUM 200 MG/2ML IV SOLN
INTRAVENOUS | Status: DC | PRN
  Administered 2019-01-31: 200 mg via INTRAVENOUS

## 2019-01-31 MED ORDER — ONDANSETRON HCL 4 MG PO TABS: 4.0000 mg | ORAL_TABLET | Freq: Three times a day (TID) | ORAL | 0 refills | Status: DC | PRN

## 2019-01-31 MED ORDER — CHLORHEXIDINE GLUCONATE 4 % EX LIQD: 60.0000 mL | Freq: Once | CUTANEOUS | Status: DC

## 2019-01-31 MED ORDER — FENTANYL CITRATE (PF) 250 MCG/5ML IJ SOLN
INTRAMUSCULAR | Status: AC
  Filled 2019-01-31: qty 5

## 2019-01-31 MED ORDER — MIDAZOLAM HCL 2 MG/2ML IJ SOLN
INTRAMUSCULAR | Status: AC
  Filled 2019-01-31: qty 2

## 2019-01-31 MED ORDER — PROPOFOL 10 MG/ML IV BOLUS
INTRAVENOUS | Status: AC
  Filled 2019-01-31: qty 20

## 2019-01-31 MED ORDER — OXYCODONE HCL 5 MG PO TABS: 5.0000 mg | ORAL_TABLET | Freq: Four times a day (QID) | ORAL | 0 refills | Status: DC | PRN

## 2019-01-31 MED ORDER — ONDANSETRON HCL 4 MG/2ML IJ SOLN: 4.0000 mg | Freq: Once | INTRAMUSCULAR | Status: DC | PRN

## 2019-01-31 MED ORDER — ONDANSETRON HCL 4 MG/2ML IJ SOLN
INTRAMUSCULAR | Status: DC | PRN
  Administered 2019-01-31: 4 mg via INTRAVENOUS

## 2019-01-31 MED ORDER — MIDAZOLAM HCL 2 MG/2ML IJ SOLN
1.0000 mg | INTRAMUSCULAR | Status: DC
  Administered 2019-01-31: 14:00:00 2 mg via INTRAVENOUS
  Filled 2019-01-31: qty 2

## 2019-01-31 MED ORDER — PHENYLEPHRINE 40 MCG/ML (10ML) SYRINGE FOR IV PUSH (FOR BLOOD PRESSURE SUPPORT)
PREFILLED_SYRINGE | INTRAVENOUS | Status: AC
  Filled 2019-01-31: qty 10

## 2019-01-31 MED ORDER — ROCURONIUM BROMIDE 10 MG/ML (PF) SYRINGE
PREFILLED_SYRINGE | INTRAVENOUS | Status: AC
  Filled 2019-01-31: qty 10

## 2019-01-31 MED ORDER — DEXAMETHASONE SODIUM PHOSPHATE 10 MG/ML IJ SOLN
INTRAMUSCULAR | Status: AC
  Filled 2019-01-31: qty 1

## 2019-01-31 MED ORDER — OXYCODONE HCL 5 MG PO TABS: 5.0000 mg | ORAL_TABLET | Freq: Once | ORAL | Status: DC | PRN

## 2019-01-31 MED ORDER — FENTANYL CITRATE (PF) 100 MCG/2ML IJ SOLN
INTRAMUSCULAR | Status: DC | PRN
  Administered 2019-01-31 (×3): 50 ug via INTRAVENOUS

## 2019-01-31 MED ORDER — DEXAMETHASONE SODIUM PHOSPHATE 4 MG/ML IJ SOLN
INTRAMUSCULAR | Status: DC | PRN
  Administered 2019-01-31: 8 mg via INTRAVENOUS

## 2019-01-31 MED ORDER — ROCURONIUM BROMIDE 50 MG/5ML IV SOSY
PREFILLED_SYRINGE | INTRAVENOUS | Status: DC | PRN
  Administered 2019-01-31: 50 mg via INTRAVENOUS

## 2019-01-31 MED ORDER — 0.9 % SODIUM CHLORIDE (POUR BTL) OPTIME
TOPICAL | Status: DC | PRN
  Administered 2019-01-31: 1000 mL

## 2019-01-31 MED ORDER — CYCLOBENZAPRINE HCL 10 MG PO TABS: 10.0000 mg | ORAL_TABLET | Freq: Three times a day (TID) | ORAL | 1 refills | Status: DC | PRN

## 2019-01-31 MED ORDER — CEFAZOLIN SODIUM-DEXTROSE 2-4 GM/100ML-% IV SOLN
2.0000 g | INTRAVENOUS | Status: AC
  Administered 2019-01-31: 2 g via INTRAVENOUS
  Filled 2019-01-31: qty 100

## 2019-01-31 MED ORDER — OXYCODONE HCL 5 MG/5ML PO SOLN: 5.0000 mg | Freq: Once | ORAL | Status: DC | PRN

## 2019-01-31 MED ORDER — MIDAZOLAM HCL 5 MG/5ML IJ SOLN
INTRAMUSCULAR | Status: DC | PRN
  Administered 2019-01-31 (×2): 1 mg via INTRAVENOUS

## 2019-01-31 MED ORDER — TRANEXAMIC ACID-NACL 1000-0.7 MG/100ML-% IV SOLN
1000.0000 mg | INTRAVENOUS | Status: AC
  Administered 2019-01-31: 1000 mg via INTRAVENOUS
  Filled 2019-01-31: qty 100

## 2019-01-31 MED ORDER — ONDANSETRON HCL 4 MG/2ML IJ SOLN
INTRAMUSCULAR | Status: AC
  Filled 2019-01-31: qty 2

## 2019-01-31 MED ORDER — PHENYLEPHRINE 40 MCG/ML (10ML) SYRINGE FOR IV PUSH (FOR BLOOD PRESSURE SUPPORT)
PREFILLED_SYRINGE | INTRAVENOUS | Status: DC | PRN
  Administered 2019-01-31 (×4): 40 ug via INTRAVENOUS
  Administered 2019-01-31 (×2): 80 ug via INTRAVENOUS

## 2019-01-31 MED ORDER — FENTANYL CITRATE (PF) 100 MCG/2ML IJ SOLN
50.0000 ug | INTRAMUSCULAR | Status: DC
  Administered 2019-01-31: 100 ug via INTRAVENOUS
  Filled 2019-01-31: qty 2

## 2019-01-31 MED ORDER — FENTANYL CITRATE (PF) 100 MCG/2ML IJ SOLN: 25.0000 ug | INTRAMUSCULAR | Status: DC | PRN

## 2019-01-31 MED ORDER — PROPOFOL 10 MG/ML IV BOLUS
INTRAVENOUS | Status: DC | PRN
  Administered 2019-01-31: 150 mg via INTRAVENOUS

## 2019-01-31 MED ORDER — STERILE WATER FOR IRRIGATION IR SOLN
Status: DC | PRN
  Administered 2019-01-31: 2000 mL

## 2019-01-31 MED ORDER — LACTATED RINGERS IV SOLN
INTRAVENOUS | Status: DC
  Administered 2019-01-31 (×2): via INTRAVENOUS

## 2019-01-31 SURGICAL SUPPLY — 60 items
BAG ZIPLOCK 12X15 (MISCELLANEOUS) ×3 IMPLANT
BIT DRILL 3.2 (BIT) ×2
BIT DRILL 3.2XCALB NS DISP (BIT) ×1 IMPLANT
BIT DRILL CALIBRATED 2.7 (BIT) ×2 IMPLANT
BIT DRILL CALIBRATED 2.7MM (BIT) ×1
BIT DRL 3.2XCALB NS DISP (BIT) ×1
COOLER ICEMAN CLASSIC (MISCELLANEOUS) ×3 IMPLANT
COVER SURGICAL LIGHT HANDLE (MISCELLANEOUS) ×3 IMPLANT
COVER WAND RF STERILE (DRAPES) IMPLANT
DERMABOND ADVANCED (GAUZE/BANDAGES/DRESSINGS) ×2
DERMABOND ADVANCED .7 DNX12 (GAUZE/BANDAGES/DRESSINGS) ×1 IMPLANT
DRAPE C-ARM 42X120 X-RAY (DRAPES) ×3 IMPLANT
DRAPE C-ARMOR (DRAPES) IMPLANT
DRAPE ORTHO SPLIT 77X108 STRL (DRAPES) ×4
DRAPE SURG ORHT 6 SPLT 77X108 (DRAPES) ×2 IMPLANT
DRAPE U-SHAPE 47X51 STRL (DRAPES) ×3 IMPLANT
DRSG AQUACEL AG ADV 3.5X 6 (GAUZE/BANDAGES/DRESSINGS) ×3 IMPLANT
DRSG AQUACEL AG ADV 3.5X10 (GAUZE/BANDAGES/DRESSINGS) IMPLANT
DURAPREP 26ML APPLICATOR (WOUND CARE) ×3 IMPLANT
ELECT REM PT RETURN 15FT ADLT (MISCELLANEOUS) ×3 IMPLANT
GLOVE BIO SURGEON STRL SZ7.5 (GLOVE) ×3 IMPLANT
GLOVE BIO SURGEON STRL SZ8 (GLOVE) ×3 IMPLANT
GLOVE SS BIOGEL STRL SZ 7 (GLOVE) ×1 IMPLANT
GLOVE SS BIOGEL STRL SZ 7.5 (GLOVE) ×1 IMPLANT
GLOVE SUPERSENSE BIOGEL SZ 7 (GLOVE) ×2
GLOVE SUPERSENSE BIOGEL SZ 7.5 (GLOVE) ×2
GOWN STRL REUS W/TWL LRG LVL3 (GOWN DISPOSABLE) ×6 IMPLANT
K-WIRE 2X5 SS THRDED S3 (WIRE) ×3
KIT BASIN OR (CUSTOM PROCEDURE TRAY) ×3 IMPLANT
KIT TURNOVER KIT A (KITS) IMPLANT
KWIRE 2X5 SS THRDED S3 (WIRE) ×1 IMPLANT
MANIFOLD NEPTUNE II (INSTRUMENTS) ×3 IMPLANT
NEEDLE TAPERED W/ NITINOL LOOP (MISCELLANEOUS) ×3 IMPLANT
NS IRRIG 1000ML POUR BTL (IV SOLUTION) ×3 IMPLANT
PACK SHOULDER (CUSTOM PROCEDURE TRAY) ×3 IMPLANT
PAD COLD SHLDR WRAP-ON (PAD) ×3 IMPLANT
PEG LOCKING 3.2MMX46 (Peg) ×3 IMPLANT
PEG LOCKING 3.2X32 (Peg) ×3 IMPLANT
PEG LOCKING 3.2X34 (Screw) ×12 IMPLANT
PEG LOCKING 3.2X36 (Screw) ×6 IMPLANT
PEG LOCKING 3.2X50 (Screw) ×3 IMPLANT
PENCIL SMOKE EVACUATOR (MISCELLANEOUS) IMPLANT
PLATE PROX HUM HI R 3H 80 (Plate) ×3 IMPLANT
PROTECTOR NERVE ULNAR (MISCELLANEOUS) ×3 IMPLANT
PUTTY DBM STAGRAFT PLUS 5CC (Putty) ×3 IMPLANT
RESTRAINT HEAD UNIVERSAL NS (MISCELLANEOUS) ×3 IMPLANT
SCREW LP NL T15 3.5X20 (Screw) ×6 IMPLANT
SCREW LP NL T15 3.5X22 (Screw) ×3 IMPLANT
SLING ARM FOAM STRAP LRG (SOFTGOODS) IMPLANT
SLING ARM FOAM STRAP MED (SOFTGOODS) ×3 IMPLANT
SLING ARM FOAM STRAP SML (SOFTGOODS) IMPLANT
SUCTION FRAZIER HANDLE 12FR (TUBING) ×2
SUCTION TUBE FRAZIER 12FR DISP (TUBING) ×1 IMPLANT
SUT FIBERWIRE #2 38 T-5 BLUE (SUTURE) ×3
SUT MNCRL AB 3-0 PS2 18 (SUTURE) ×3 IMPLANT
SUT MON AB 2-0 CT1 36 (SUTURE) ×3 IMPLANT
SUT VIC AB 1 CT1 36 (SUTURE) ×3 IMPLANT
SUTURE FIBERWR #2 38 T-5 BLUE (SUTURE) ×1 IMPLANT
TOWEL OR 17X26 10 PK STRL BLUE (TOWEL DISPOSABLE) ×3 IMPLANT
TOWEL OR NON WOVEN STRL DISP B (DISPOSABLE) ×3 IMPLANT

## 2019-01-31 NOTE — Anesthesia Procedure Notes (Signed)
Procedure Name: Intubation Date/Time: 01/31/2019 2:33 PM Performed by: Deliah Boston, CRNA Pre-anesthesia Checklist: Patient identified, Emergency Drugs available, Suction available and Patient being monitored Patient Re-evaluated:Patient Re-evaluated prior to induction Oxygen Delivery Method: Circle system utilized Preoxygenation: Pre-oxygenation with 100% oxygen Induction Type: IV induction Ventilation: Mask ventilation without difficulty Laryngoscope Size: Mac and 3 Grade View: Grade I Tube type: Oral Tube size: 7.0 mm Number of attempts: 1 Airway Equipment and Method: Stylet and Oral airway Placement Confirmation: ETT inserted through vocal cords under direct vision,  positive ETCO2 and breath sounds checked- equal and bilateral Secured at: 20 cm Tube secured with: Tape Dental Injury: Teeth and Oropharynx as per pre-operative assessment

## 2019-01-31 NOTE — Discharge Instructions (Signed)
° °Kevin M. Supple, M.D., F.A.A.O.S. °Orthopaedic Surgery °Specializing in Arthroscopic and Reconstructive °Surgery of the Shoulder °[phone] °[address] - [place], [place] [address] - Fax [phone] ° ° °POST-OP TOTAL SHOULDER REPLACEMENT INSTRUCTIONS ° °1. Call the office at [phone] to schedule your first post-op appointment 10-14 days from the date of your surgery. ° °2. The bandage over your incision is waterproof. You may begin showering with this dressing on. You may leave this dressing on until first follow up appointment within 2 weeks. We prefer you leave this dressing in place until follow up however after 5-7 days if you are having itching or skin irritation and would like to remove it you may do so. Go slow and tug at the borders gently to break the bond the dressing has with the skin. At this point if there is no drainage it is okay to go without a bandage or you may cover it with a light guaze and tape. You can also expect significant bruising around your shoulder that will drift down your arm and into your chest wall. This is very normal and should resolve over several days. ° ° 3. Wear your sling/immobilizer at all times except to perform the exercises below or to occasionally let your arm dangle by your side to stretch your elbow. You also need to sleep in your sling immobilizer until instructed otherwise. It is ok to remove your sling if you are sitting in a controlled environment and allow your arm to rest in a position of comfort by your side or on your lap with pillows to give your neck and skin a break from the sling. You may remove it to allow arm to dangle by side to shower. If you are up walking around and when you go to sleep at night you need to wear it. ° °4. Range of motion to your elbow, wrist, and hand are encouraged 3-5 times daily. Exercise to your hand and fingers helps to reduce swelling you may experience. ° °5. Utilize ice to the shoulder 3-5 times  minimum a day and additionally if you are experiencing pain. ° °6. Prescriptions for a pain medication and a muscle relaxant are provided for you. It is recommended that if you are experiencing pain that you pain medication alone is not controlling, add the muscle relaxant along with the pain medication which can give additional pain relief. The first 1-2 days is generally the most severe of your pain and then should gradually decrease. As your pain lessens it is recommended that you decrease your use of the pain medications to an "as needed basis'" only and to always comply with the recommended dosages of the pain medications. ° °7. Pain medications can produce constipation along with their use. If you experience this, the use of an over the counter stool softener or laxative daily is recommended.  ° °8. For additional questions or concerns, please do not hesitate to call the office. If after hours there is an answering service to forward your concerns to the physician on call. ° °9.Pain control following an exparel block ° °To help control your post-operative pain you received a nerve block  performed with Exparel which is a long acting anesthetic (numbing agent) which can provide pain relief and sensations of numbness (and relief of pain) in the operative shoulder and arm for up to 3 days. Sometimes it provides mixed relief, meaning you may still have numbness in certain areas of the arm but can still   be able to move  parts of that arm, hand, and fingers. We recommend that your prescribed pain medications  be used as needed. We do not feel it is necessary to "pre medicate" and "stay ahead" of pain.  Taking narcotic pain medications when you are not having any pain can lead to unnecessary and potentially dangerous side effects.    10. Use the ice machine as much as possible in the first 5-7 days from surgery, then you can wean its use to as needed. The ice typically needs to be replaced every 6 hours, instead of  ice you can actually freeze water bottles to put in the cooler and then fill water around them to avoid having to purchase ice. You can have spare water bottles freezing to allow you to rotate them once they have melted. Try to have a thin shirt or light cloth or towel under the ice wrap to protect your skin.   11.  We recommend that you avoid any dental work or cleaning in the first 3 months following your joint replacement. This is to help minimize the possibility of infection from the bacteria in your mouth that enters your bloodstream during dental work. We also recommend that you take an antibiotic prior to your dental work for the first year after your shoulder replacement to further help reduce that risk. Please simply contact our office for antibiotics to be sent to your pharmacy prior to dental work.  POST-OP EXERCISES  OK TO ALLOW ARM TO DANGLE BY SIDE AND MOVE ELBOW WRIST AND HAND

## 2019-01-31 NOTE — Telephone Encounter (Signed)
Scheduled apt per 12/8 sch message - SCP - pt to get an updated schedule next visit.

## 2019-01-31 NOTE — Transfer of Care (Signed)
Immediate Anesthesia Transfer of Care Note  Patient: Kathryn [name]  Procedure(s) Performed: Procedure(s) with comments: OPEN REDUCTION INTERNAL FIXATION (ORIF) Right 3 part proximal humerus fracture (Right) - 171min  Patient Location: PACU  Anesthesia Type:General and Regional  Level of Consciousness: Patient easily awoken, sedated, comfortable, cooperative, following commands, responds to stimulation.   Airway & Oxygen Therapy: Patient spontaneously breathing, ventilating well, oxygen via simple oxygen mask.  Post-op Assessment: Report given to PACU RN, vital signs reviewed and stable, moving all extremities.   Post vital signs: Reviewed and stable.  Complications: No apparent anesthesia complications  Last Vitals:  Vitals Value Taken Time  BP 126/85 01/31/19 1617  Temp    Pulse 89 01/31/19 1621  Resp 15 01/31/19 1621  SpO2 97 % 01/31/19 1621  Vitals shown include unvalidated device data.  Last Pain:  Vitals:   01/31/19 1253  TempSrc:   PainSc: 0-No pain      Patients Stated Pain Goal: 4 ([id number])  Complications: No apparent anesthesia complications

## 2019-01-31 NOTE — Op Note (Signed)
01/31/2019  3:52 PM  PATIENT:   Kathryn [name]  58 y.o. female  PRE-OPERATIVE DIAGNOSIS:  Right 3 part proximal humerus fracture  POST-OPERATIVE DIAGNOSIS: Same  PROCEDURE: Open reduction and internal fixation of displaced right three-part proximal humerus fracture  SURGEON:  Aizen Duval, Metta Clines M.D.  ASSISTANTS: Jenetta Loges, PA-C  ANESTHESIA:   General endotracheal and interscalene block with Exparel  EBL: 150 cc  SPECIMEN: None  Drains: None   PATIENT DISPOSITION:  PACU - hemodynamically stable.    PLAN OF CARE: Admit for overnight observation  Brief history:  Kathryn [name] is a 57 year old female who sustained a ground level fall earlier this week injuring her right shoulder with radiographs subsequently showing an impacted and moderately displaced three-part proximal humerus fracture.  Due to the degree of displacement we discussed with her the various treatment options to include ORIF with bone grafting.  Potential surgical risks were reviewed including bleeding, infection, neurovascular injury, persistent pain, malunion, nonunion, loss of fixation, anesthetic complication, and possible need for additional surgery.  She understands, and accepts, and agrees with our planned procedure.  Procedure in detail:  After undergoing routine preop evaluation patient received prophylactic antibiotics and interscalene block with Exparel was established in the holding area by the anesthesia department.  Patient subsequently placed supine on the operating table and underwent the smooth induction of a general endotracheal anesthesia.  Subsequently placed into the beachchair position and appropriately padded and protected.  At this point fluoroscopic imaging was then utilized to confirm that proper visualization of the shoulder could be achieved.  The right shoulder girdle region was then sterilely prepped and draped in standard fashion.  Timeout was called.  An anterior deltopectoral approach to  the right shoulder is made through an 8 cm incision.  Skin flaps were elevated dissection carried deeply and the deltopectoral interval was then developed from proximal to this with the vein taken laterally.  We then divided beneath the deltoid and gained access to the proximal humeral fracture site.  A bursectomy was performed.  The greater tuberosity was a separate fragment and I passed a pair of #2 FiberWire sutures through the bone tendon junction of the greater tuberosity of the rotator cuff insertion to help with manipulation of the fragment.  Through the fracture site I then manipulated the articular segment and gained reduction and then utilizing visualization with fluoroscopy confirmed the overall alignment and position was to our satisfaction.  The 3-hole Biomet proximal humeral plate was then provisionally fastened to the lateral cortex of the proximal humerus just posterior to the bicipital groove and held provisionally with a bone clamp.  Additional manipulation was performed to gain an acceptable reduction and using fluoroscopy confirmed our alignment is to our satisfaction.  At this point a guidepin was then directed up into the numeral head with proper positioning confirmed on orthogonal views.  Once we are satisfied with the overall position and reduction we placed the series of pegs up into the humeral head and 3 screws into the shaft attaining good fixation.  Final imaging showed good position the hardware and good alignment of fracture site.  We then impacted 5 cc of DBM bone graft anterolaterally through the fracture site up into the fracture region to assist with healing.  The wound was then copiously irrigated.  The deltopectoral interval was reapproximated with a series of figure-of-eight #1 Vicryl sutures.  2-0 Vicryl used for the subcu layer and intracuticular 3 Monocryl for the skin followed by Dermabond and  Aquacel dressing.  The right arm was placed in a sling and the patient was  awakened, extubated, and taken to the recovery in stable addition.  Jenetta Loges, PA-C was used as an Environmental consultant throughout this case essential for help with positioning of the patient, positioning extremity, tissue manipulation, fracture reduction, wound closure, and intraoperative decision-making.  Metta Clines Tanesha Arambula MD   Contact # [phone]

## 2019-01-31 NOTE — Anesthesia Postprocedure Evaluation (Signed)
Anesthesia Post Note  Patient: Kathryn [name]  Procedure(s) Performed: OPEN REDUCTION INTERNAL FIXATION (ORIF) Right 3 part proximal humerus fracture (Right Shoulder)     Patient location during evaluation: PACU Anesthesia Type: General Level of consciousness: awake and alert Pain management: pain level controlled Vital Signs Assessment: post-procedure vital signs reviewed and stable Respiratory status: spontaneous breathing, nonlabored ventilation, respiratory function stable and patient connected to nasal cannula oxygen Cardiovascular status: blood pressure returned to baseline and stable Postop Assessment: no apparent nausea or vomiting Anesthetic complications: no    Last Vitals:  Vitals:   01/31/19 1715 01/31/19 1732  BP: (!) 121/50 131/72  Pulse: 87 84  Resp: 15 14  Temp: 36.7 C 36.6 C  SpO2: 92% 94%    Last Pain:  Vitals:   01/31/19 1732  TempSrc: Oral  PainSc: 0-No pain                 Ramiel Forti COKER

## 2019-01-31 NOTE — Progress Notes (Signed)
Assisted Dr. Joslin with right, ultrasound guided, interscalene  block. Side rails up, monitors on throughout procedure. See vital signs in flow sheet. Tolerated Procedure well. 

## 2019-01-31 NOTE — Anesthesia Procedure Notes (Signed)
Anesthesia Regional Block: Interscalene brachial plexus block   Pre-Anesthetic Checklist: ,, timeout performed, Correct Patient, Correct Site, Correct Laterality, Correct Procedure, Correct Position, site marked, Risks and benefits discussed,  Surgical consent,  Pre-op evaluation,  At surgeon's request and post-op pain management  Laterality: Right  Prep: chloraprep       Needles:  Injection technique: Single-shot  Needle Type: Stimulator Needle - 40      Needle Gauge: 22     Additional Needles:   Procedures:, nerve stimulator,,,,,,,  Narrative:  Start time: 01/31/2019 1:35 PM End time: 01/31/2019 1:45 PM Injection made incrementally with aspirations every 5 mL.  Performed by: Personally   Additional Notes: 20 cc 0.5% Bupivacaine with 1:200 epi  10 cc 1.3% Exparel injected easily

## 2019-01-31 NOTE — Anesthesia Preprocedure Evaluation (Addendum)
Anesthesia Evaluation  Patient identified by MRN, date of birth, ID band Patient awake    Reviewed: Allergy & Precautions, NPO status , Patient's Chart, lab work & pertinent test results  Airway Mallampati: II  TM Distance: >3 FB Neck ROM: Full    Dental  (+) Teeth Intact, Dental Advisory Given   Pulmonary    breath sounds clear to auscultation       Cardiovascular  Rhythm:Regular Rate:Normal     Neuro/Psych    GI/Hepatic   Endo/Other    Renal/GU      Musculoskeletal   Abdominal   Peds  Hematology   Anesthesia Other Findings   Reproductive/Obstetrics                            Anesthesia Physical Anesthesia Plan  ASA: III  Anesthesia Plan: General   Post-op Pain Management:  Regional for Post-op pain   Induction:   PONV Risk Score and Plan: Ondansetron and Dexamethasone  Airway Management Planned:   Additional Equipment:   Intra-op Plan:   Post-operative Plan:   Informed Consent: I have reviewed the patients History and Physical, chart, labs and discussed the procedure including the risks, benefits and alternatives for the proposed anesthesia with the patient or authorized representative who has indicated his/her understanding and acceptance.     Dental advisory given  Plan Discussed with: CRNA and Anesthesiologist  Anesthesia Plan Comments:         Anesthesia Quick Evaluation

## 2019-01-31 NOTE — H&P (Signed)
Kathryn [name]    Chief Complaint: Right 3 part proximal humerus fracture HPI: The patient is a 58 y.o. female status post ground level fall sustaining an impacted and displaced right proximal humerus fracture.  Due to the degree of displacement she has been counseled regarding treatment options and at this time is brought to the operating room for planned ORIF.  Past Medical History:  Diagnosis Date  . Breast cancer ([place])    stage 3 - left  . Family history of kidney cancer   . Family history of melanoma   . Headache    hormone related, none since 1st child was born   . Pleurisy     Past Surgical History:  Procedure Laterality Date  . AXILLARY LYMPH NODE DISSECTION Left 10/11/2018   Procedure: LEFT AXILLARY LYMPH NODE DISSECTION;  Surgeon: Erroll Luna, MD;  Location: [facility];  Service: General;  Laterality: Left;  . BREAST LUMPECTOMY WITH RADIOACTIVE SEED AND SENTINEL LYMPH NODE BIOPSY Left 09/23/2018   Procedure: LEFT BREAST LUMPECTOMY WITH RADIOACTIVE SEED AND LEFT AXILLARY TARGETED LYMPH NODE BIOPY AND  LEFT AXILLARY SENTINEL LYMPH NODE MAPPING;  Surgeon: Erroll Luna, MD;  Location: [facility];  Service: General;  Laterality: Left;  . CESAREAN SECTION     x3  . PORTACATH PLACEMENT N/A 04/28/2018   Procedure: INSERTION PORT-A-CATH WITH ULTRASOUND;  Surgeon: Erroll Luna, MD;  Location: [facility];  Service: General;  Laterality: N/A;    Family History  Problem Relation Age of Onset  . Kidney cancer Sister 81       d. 90  . Lung cancer Paternal Uncle   . Melanoma Sister   . Melanoma Sister     Social History:  reports that she has never smoked. She has never used smokeless tobacco. She reports previous alcohol use. She reports that she does not use drugs.   Medications Prior to Admission  Medication Sig Dispense Refill  . acetaminophen (TYLENOL) 500 MG tablet Take 1,000 mg by mouth every 6 (six) hours as needed (for pain.).    Marland Kitchen capecitabine (XELODA) 500 MG  tablet TAKE 2 TABLETS (1,000 MG TOTAL) BY MOUTH 2 (TWO) TIMES DAILY AFTER A MEAL. TAKE ON DAYS OF RADIATION ONLY, M-F (Patient taking differently: Take 1,000 mg by mouth See admin instructions. Take 2 tablets (1000 mg) by mouth twice daily on Mondays through Fridays.) 44 tablet 0  . docusate sodium (COLACE) 100 MG capsule Take 100 mg by mouth at bedtime.    Marland Kitchen ibuprofen (ADVIL) 800 MG tablet Take 1 tablet (800 mg total) by mouth every 8 (eight) hours as needed. (Patient taking differently: Take 800 mg by mouth every 8 (eight) hours as needed (pain). ) 30 tablet 0  . lidocaine-prilocaine (EMLA) cream Apply to affected area once (Patient taking differently: Apply 1 application topically daily as needed (prior to port being flushed). ) 30 g 3  . loratadine (CLARITIN) 10 MG tablet Take 10 mg by mouth daily as needed for allergies.     Marland Kitchen oxyCODONE (OXY IR/ROXICODONE) 5 MG immediate release tablet Take 1 tablet (5 mg total) by mouth every 6 (six) hours as needed for severe pain. 15 tablet 0     Physical Exam: Right shoulder demonstrates global tenderness with resolving ecchymosis extending distally towards the elbow.  The skin is intact.  She is intact light touch sensation in the axillary nerve distribution.  She has significant pain with attempts at passive motion of the shoulder.  She is  grossly neurovascular intact distally in the right upper extremity.  Plain film x-rays of the right shoulder confirm a moderately impacted and displaced three-part proximal humerus fracture.  A CT scan with 3D reconstructions was also obtained confirming the degree of displacement.  Vitals  Temp:  [98.1 F (36.7 C)-98.5 F (36.9 C)] 98.5 F (36.9 C) (12/08 1220) Pulse Rate:  [95-102] 95 (12/08 1220) Resp:  [16-20] 20 (12/08 1220) BP: (127)/(71) 127/71 (12/07 1402) SpO2:  [98 %-100 %] 98 % (12/08 1220) Weight:  [68 kg] 68 kg (12/08 1253)  Assessment/Plan  Impression: Right 3 part proximal humerus  fracture  Plan of Action: Procedure(s): OPEN REDUCTION INTERNAL FIXATION (ORIF) Right 3 part proximal humerus fracture  Tajai Suder M Nathin Saran 01/31/2019, 1:36 PM Contact # [phone]

## 2019-02-01 ENCOUNTER — Ambulatory Visit: Payer: BC Managed Care – PPO

## 2019-02-02 ENCOUNTER — Telehealth: Payer: Self-pay | Admitting: Rehabilitation

## 2019-02-02 ENCOUNTER — Ambulatory Visit: Payer: BC Managed Care – PPO

## 2019-02-02 ENCOUNTER — Encounter: Payer: Self-pay | Admitting: *Deleted

## 2019-02-02 NOTE — Telephone Encounter (Signed)
Returned call to pt who fell and broke her arm and had to have surgery.  We cancelled her cancer therapy follow up as she reports she has finished radiation and still has excellent ROM and no increased swelling.  Pt will most likely see [place] after clearance for PT for the humerus fracture.

## 2019-02-03 ENCOUNTER — Ambulatory Visit: Payer: BC Managed Care – PPO

## 2019-02-06 ENCOUNTER — Ambulatory Visit: Payer: BC Managed Care – PPO
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
## 2019-02-08 ENCOUNTER — Other Ambulatory Visit: Payer: Self-pay

## 2019-02-08 ENCOUNTER — Inpatient Hospital Stay: Payer: BC Managed Care – PPO

## 2019-02-08 ENCOUNTER — Inpatient Hospital Stay: Payer: BC Managed Care – PPO | Admitting: Oncology

## 2019-02-08 VITALS — BP 122/85 | HR 108 | Temp 98.4°F | Resp 18 | Ht 64.0 in | Wt 149.6 lb

## 2019-02-08 DIAGNOSIS — Z9221 Personal history of antineoplastic chemotherapy: Secondary | ICD-10-CM | POA: Diagnosis not present

## 2019-02-08 DIAGNOSIS — C50412 Malignant neoplasm of upper-outer quadrant of left female breast: Secondary | ICD-10-CM

## 2019-02-08 DIAGNOSIS — Z171 Estrogen receptor negative status [ER-]: Secondary | ICD-10-CM | POA: Diagnosis not present

## 2019-02-08 DIAGNOSIS — Z8249 Family history of ischemic heart disease and other diseases of the circulatory system: Secondary | ICD-10-CM | POA: Diagnosis not present

## 2019-02-08 DIAGNOSIS — Z923 Personal history of irradiation: Secondary | ICD-10-CM | POA: Diagnosis not present

## 2019-02-08 DIAGNOSIS — Z801 Family history of malignant neoplasm of trachea, bronchus and lung: Secondary | ICD-10-CM | POA: Diagnosis not present

## 2019-02-08 DIAGNOSIS — Z808 Family history of malignant neoplasm of other organs or systems: Secondary | ICD-10-CM | POA: Diagnosis not present

## 2019-02-08 DIAGNOSIS — Z8051 Family history of malignant neoplasm of kidney: Secondary | ICD-10-CM | POA: Diagnosis not present

## 2019-02-08 DIAGNOSIS — Z79899 Other long term (current) drug therapy: Secondary | ICD-10-CM | POA: Diagnosis not present

## 2019-02-08 DIAGNOSIS — Z791 Long term (current) use of non-steroidal anti-inflammatories (NSAID): Secondary | ICD-10-CM | POA: Diagnosis not present

## 2019-02-08 DIAGNOSIS — Z452 Encounter for adjustment and management of vascular access device: Secondary | ICD-10-CM | POA: Diagnosis not present

## 2019-02-08 LAB — CBC WITH DIFFERENTIAL/PLATELET
Abs Immature Granulocytes: 0.01 10*3/uL (ref 0.00–0.07)
Basophils Absolute: 0 10*3/uL (ref 0.0–0.1)
Basophils Relative: 1 %
Eosinophils Absolute: 0 10*3/uL (ref 0.0–0.5)
Eosinophils Relative: 1 %
HCT: 33.2 % — ABNORMAL LOW (ref 36.0–46.0)
Hemoglobin: 10.7 g/dL — ABNORMAL LOW (ref 12.0–15.0)
Immature Granulocytes: 0 %
Lymphocytes Relative: 16 %
Lymphs Abs: 0.6 10*3/uL — ABNORMAL LOW (ref 0.7–4.0)
MCH: 32 pg (ref 26.0–34.0)
MCHC: 32.2 g/dL (ref 30.0–36.0)
MCV: 99.4 fL (ref 80.0–100.0)
Monocytes Absolute: 0.4 10*3/uL (ref 0.1–1.0)
Monocytes Relative: 11 %
Neutro Abs: 2.8 10*3/uL (ref 1.7–7.7)
Neutrophils Relative %: 71 %
Platelets: 221 10*3/uL (ref 150–400)
RBC: 3.34 MIL/uL — ABNORMAL LOW (ref 3.87–5.11)
RDW: 19 % — ABNORMAL HIGH (ref 11.5–15.5)
WBC: 3.8 10*3/uL — ABNORMAL LOW (ref 4.0–10.5)
nRBC: 0 % (ref 0.0–0.2)

## 2019-02-08 LAB — COMPREHENSIVE METABOLIC PANEL
ALT: 17 U/L (ref 0–44)
AST: 18 U/L (ref 15–41)
Albumin: 3.9 g/dL (ref 3.5–5.0)
Alkaline Phosphatase: 130 U/L — ABNORMAL HIGH (ref 38–126)
Anion gap: 11 (ref 5–15)
BUN: 12 mg/dL (ref 6–20)
CO2: 25 mmol/L (ref 22–32)
Calcium: 9.3 mg/dL (ref 8.9–10.3)
Chloride: 103 mmol/L (ref 98–111)
Creatinine, Ser: 0.72 mg/dL (ref 0.44–1.00)
GFR calc Af Amer: >60 mL/min (ref >60)
GFR calc non Af Amer: >60 mL/min (ref >60)
Glucose, Bld: 107 mg/dL — ABNORMAL HIGH (ref 70–99)
Potassium: 4.1 mmol/L (ref 3.5–5.1)
Sodium: 139 mmol/L (ref 135–145)
Total Bilirubin: 0.7 mg/dL (ref 0.3–1.2)
Total Protein: 7.3 g/dL (ref 6.5–8.1)

## 2019-02-09 ENCOUNTER — Telehealth: Payer: Self-pay | Admitting: Oncology

## 2019-02-09 ENCOUNTER — Other Ambulatory Visit: Payer: Self-pay | Admitting: Oncology

## 2019-02-09 ENCOUNTER — Ambulatory Visit: Payer: BC Managed Care – PPO

## 2019-02-09 ENCOUNTER — Other Ambulatory Visit: Payer: Self-pay

## 2019-02-09 DIAGNOSIS — C50412 Malignant neoplasm of upper-outer quadrant of left female breast: Secondary | ICD-10-CM

## 2019-02-09 MED ORDER — CAPECITABINE 500 MG PO TABS: 1500.0000 mg | ORAL_TABLET | Freq: Two times a day (BID) | ORAL | 0 refills | Status: DC

## 2019-02-09 NOTE — Telephone Encounter (Signed)
I talk with patient regarding schedule  

## 2019-02-10 DIAGNOSIS — C50912 Malignant neoplasm of unspecified site of left female breast: Secondary | ICD-10-CM | POA: Diagnosis not present

## 2019-02-15 DIAGNOSIS — S42201D Unspecified fracture of upper end of right humerus, subsequent encounter for fracture with routine healing: Secondary | ICD-10-CM | POA: Diagnosis not present

## 2019-02-20 ENCOUNTER — Other Ambulatory Visit: Payer: Self-pay | Admitting: Pharmacist

## 2019-02-20 DIAGNOSIS — C50412 Malignant neoplasm of upper-outer quadrant of left female breast: Secondary | ICD-10-CM

## 2019-02-20 MED ORDER — CAPECITABINE 500 MG PO TABS: 1500.0000 mg | ORAL_TABLET | Freq: Two times a day (BID) | ORAL | 0 refills | Status: DC

## 2019-02-20 MED FILL — CAPECITABINE 500 MG TABS: 500 | 21 days supply | Qty: 84 | Fill #0

## 2019-02-23 ENCOUNTER — Encounter: Payer: Self-pay | Admitting: *Deleted

## 2019-03-07 ENCOUNTER — Telehealth: Payer: Self-pay | Admitting: Radiation Oncology

## 2019-03-07 NOTE — Telephone Encounter (Signed)
  Radiation Oncology         ([phone] ________________________________  Name: LENAMAE DROGE MRN: [id number]  Date of Service: 03/07/2019  DOB: Aug 05, 1960  Post Treatment Telephone Note  Diagnosis:   Stage IIIB, cT2N1M0 grade 3 triple negative invasive ductal carcinoma of the left breast.  Interval Since Last Radiation:  5 weeks   12/14/2018-01/31/2019: The left breast and regional nodes were treated to 50.4 Gy in 28 fractions followed by a 10 Gy boost in 5 fractions.   Narrative:  The patient was contacted today for routine follow-up. During treatment she did very well with radiotherapy and have expected skin changes without severe desquamation.   Impression/Plan: 1. Stage IIIB, cT2N1M0 grade 3 triple negative invasive ductal carcinoma of the left breast.. I could not reach the patient today but left her a voicemail detailing that we would be happy to continue to follow her as needed, but she will also continue to follow up with Dr. Jana Hakim in medical oncology. She was counseled on skin care as well as measures to avoid sun exposure to this area.       Carola Rhine, PAC

## 2019-03-13 ENCOUNTER — Encounter: Payer: Self-pay | Admitting: Oncology
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
## 2019-03-15 ENCOUNTER — Other Ambulatory Visit: Payer: Self-pay

## 2019-03-15 ENCOUNTER — Inpatient Hospital Stay: Payer: BC Managed Care – PPO | Attending: Oncology | Admitting: Oncology

## 2019-03-15 ENCOUNTER — Inpatient Hospital Stay: Payer: BC Managed Care – PPO

## 2019-03-15 VITALS — BP 119/88 | HR 110 | Temp 98.0°F | Resp 18 | Ht 64.0 in | Wt 148.2 lb

## 2019-03-15 DIAGNOSIS — Z780 Asymptomatic menopausal state: Secondary | ICD-10-CM | POA: Diagnosis not present

## 2019-03-15 DIAGNOSIS — Z8051 Family history of malignant neoplasm of kidney: Secondary | ICD-10-CM | POA: Insufficient documentation

## 2019-03-15 DIAGNOSIS — C50412 Malignant neoplasm of upper-outer quadrant of left female breast: Secondary | ICD-10-CM | POA: Insufficient documentation

## 2019-03-15 DIAGNOSIS — Z79899 Other long term (current) drug therapy: Secondary | ICD-10-CM | POA: Diagnosis not present

## 2019-03-15 DIAGNOSIS — Z791 Long term (current) use of non-steroidal anti-inflammatories (NSAID): Secondary | ICD-10-CM | POA: Diagnosis not present

## 2019-03-15 DIAGNOSIS — Z9221 Personal history of antineoplastic chemotherapy: Secondary | ICD-10-CM | POA: Insufficient documentation

## 2019-03-15 DIAGNOSIS — K5900 Constipation, unspecified: Secondary | ICD-10-CM | POA: Diagnosis not present

## 2019-03-15 DIAGNOSIS — Z801 Family history of malignant neoplasm of trachea, bronchus and lung: Secondary | ICD-10-CM | POA: Diagnosis not present

## 2019-03-15 DIAGNOSIS — Z171 Estrogen receptor negative status [ER-]: Secondary | ICD-10-CM | POA: Insufficient documentation

## 2019-03-15 DIAGNOSIS — Z9181 History of falling: Secondary | ICD-10-CM | POA: Insufficient documentation

## 2019-03-15 DIAGNOSIS — Z923 Personal history of irradiation: Secondary | ICD-10-CM | POA: Insufficient documentation

## 2019-03-15 LAB — CBC WITH DIFFERENTIAL/PLATELET
Abs Immature Granulocytes: 0.01 10*3/uL (ref 0.00–0.07)
Basophils Absolute: 0 10*3/uL (ref 0.0–0.1)
Basophils Relative: 1 %
Eosinophils Absolute: 0.1 10*3/uL (ref 0.0–0.5)
Eosinophils Relative: 2 %
HCT: 35.7 % — ABNORMAL LOW (ref 36.0–46.0)
Hemoglobin: 11.9 g/dL — ABNORMAL LOW (ref 12.0–15.0)
Immature Granulocytes: 0 %
Lymphocytes Relative: 27 %
Lymphs Abs: 1.2 10*3/uL (ref 0.7–4.0)
MCH: 32.5 pg (ref 26.0–34.0)
MCHC: 33.3 g/dL (ref 30.0–36.0)
MCV: 97.5 fL (ref 80.0–100.0)
Monocytes Absolute: 0.5 10*3/uL (ref 0.1–1.0)
Monocytes Relative: 10 %
Neutro Abs: 2.6 10*3/uL (ref 1.7–7.7)
Neutrophils Relative %: 60 %
Platelets: 211 10*3/uL (ref 150–400)
RBC: 3.66 MIL/uL — ABNORMAL LOW (ref 3.87–5.11)
RDW: 15.4 % (ref 11.5–15.5)
WBC: 4.4 10*3/uL (ref 4.0–10.5)
nRBC: 0 % (ref 0.0–0.2)

## 2019-03-15 LAB — COMPREHENSIVE METABOLIC PANEL
ALT: 13 U/L (ref 0–44)
AST: 15 U/L (ref 15–41)
Albumin: 3.8 g/dL (ref 3.5–5.0)
Alkaline Phosphatase: 111 U/L (ref 38–126)
Anion gap: 10 (ref 5–15)
BUN: 10 mg/dL (ref 6–20)
CO2: 23 mmol/L (ref 22–32)
Calcium: 8.7 mg/dL — ABNORMAL LOW (ref 8.9–10.3)
Chloride: 107 mmol/L (ref 98–111)
Creatinine, Ser: 0.72 mg/dL (ref 0.44–1.00)
GFR calc Af Amer: >60 mL/min (ref >60)
GFR calc non Af Amer: >60 mL/min (ref >60)
Glucose, Bld: 97 mg/dL (ref 70–99)
Potassium: 3.6 mmol/L (ref 3.5–5.1)
Sodium: 140 mmol/L (ref 135–145)
Total Bilirubin: 0.4 mg/dL (ref 0.3–1.2)
Total Protein: 6.7 g/dL (ref 6.5–8.1)

## 2019-03-15 MED ORDER — CAPECITABINE 500 MG PO TABS: 1500.0000 mg | ORAL_TABLET | Freq: Two times a day (BID) | ORAL | 0 refills | Status: DC

## 2019-03-15 MED ORDER — CAPECITABINE 500 MG PO TABS: 1500.0000 mg | ORAL_TABLET | Freq: Two times a day (BID) | ORAL | 6 refills | Status: DC

## 2019-03-15 MED FILL — CAPECITABINE 500 MG TABLET: 500 | 21 days supply | Qty: 84 | Fill #0

## 2019-03-15 NOTE — Telephone Encounter (Signed)
No entry 

## 2019-03-16 ENCOUNTER — Telehealth: Payer: Self-pay | Admitting: Oncology

## 2019-03-16 NOTE — Telephone Encounter (Signed)
I talk with patient regarding schedule  

## 2019-03-17 ENCOUNTER — Telehealth: Payer: Self-pay

## 2019-03-17 NOTE — Telephone Encounter (Signed)
Sent an Fish farm manager to [employer] in attempt for patient to receive Xeloda at $0 copay.  Application is in review.  [place] Patient [place] Phone [phone] Fax [phone] 03/17/2019 2:29 PM

## 2019-03-20 DIAGNOSIS — S42201D Unspecified fracture of upper end of right humerus, subsequent encounter for fracture with routine healing: Secondary | ICD-10-CM | POA: Diagnosis not present

## 2019-03-21 ENCOUNTER — Ambulatory Visit: Payer: BC Managed Care – PPO | Attending: Oncology | Admitting: Physical Therapy

## 2019-03-21 ENCOUNTER — Encounter: Payer: Self-pay | Admitting: Physical Therapy

## 2019-03-21 ENCOUNTER — Other Ambulatory Visit: Payer: Self-pay

## 2019-03-21 DIAGNOSIS — M25611 Stiffness of right shoulder, not elsewhere classified: Secondary | ICD-10-CM | POA: Diagnosis not present

## 2019-03-21 DIAGNOSIS — R293 Abnormal posture: Secondary | ICD-10-CM | POA: Diagnosis not present

## 2019-03-21 DIAGNOSIS — M25511 Pain in right shoulder: Secondary | ICD-10-CM

## 2019-03-21 DIAGNOSIS — M6281 Muscle weakness (generalized): Secondary | ICD-10-CM | POA: Diagnosis not present

## 2019-03-21 NOTE — Telephone Encounter (Signed)
Patient is approved for assistance through [place] for Xeloda beginning 03/21/19 until further notice.  [employer] phone number for follow up [phone]  [place]el[place] Amy uses [employer] [phone].  [place] Patient [place] Phone [phone] Fax [phone] 03/21/2019 1:50 PM

## 2019-03-21 NOTE — Therapy (Signed)
[place], [place], [address] Phone: [phone]   Fax:  [phone]  Physical Therapy Evaluation  Patient Details  Name: Kathryn [name] MRN: [id number] Date of Birth: October 25, 1960 Referring Provider (PT): Dr. Jana Hakim, Supple    Encounter Date: 03/21/2019  PT End of Session - 03/21/19 1752    Visit Number  1    Number of Visits  13    Date for PT Re-Evaluation  05/05/19    PT Start Time  1300    PT Stop Time  1345    PT Time Calculation (min)  45 min    Activity Tolerance  Patient tolerated treatment well    Behavior During Therapy  [facility] for tasks assessed/performed       Past Medical History:  Diagnosis Date  . Breast cancer ([place])    stage 3 - left  . Family history of kidney cancer   . Family history of melanoma   . Headache    hormone related, none since 1st child was born   . Pleurisy     Past Surgical History:  Procedure Laterality Date  . AXILLARY LYMPH NODE DISSECTION Left 10/11/2018   Procedure: LEFT AXILLARY LYMPH NODE DISSECTION;  Surgeon: Erroll Luna, MD;  Location: [place];  Service: General;  Laterality: Left;  . BREAST LUMPECTOMY WITH RADIOACTIVE SEED AND SENTINEL LYMPH NODE BIOPSY Left 09/23/2018   Procedure: LEFT BREAST LUMPECTOMY WITH RADIOACTIVE SEED AND LEFT AXILLARY TARGETED LYMPH NODE BIOPY AND  LEFT AXILLARY SENTINEL LYMPH NODE MAPPING;  Surgeon: Erroll Luna, MD;  Location: [place];  Service: General;  Laterality: Left;  . CESAREAN SECTION     x3  . ORIF HUMERUS FRACTURE Right 01/31/2019   Procedure: OPEN REDUCTION INTERNAL FIXATION (ORIF) Right 3 part proximal humerus fracture;  Surgeon: Justice Britain, MD;  [facility];  Service: Orthopedics;  Laterality: Right;  185mn  . PORTACATH PLACEMENT N/A 04/28/2018   Procedure: INSERTION PORT-A-CATH WITH ULTRASOUND;  Surgeon: CErroll Luna MD;  [facility] [facility]  Service: General;  Laterality: N/A;     There were no vitals filed for this visit.   Subjective Assessment - 03/21/19 1311    Subjective  Pt saw Dr. SOnnie Grahamyesterday and was wearing a sling until then.She has worn sling since.  It hurts sometimes when she laid on her back she has been doing a few exercises with her arm . She is not having any problems with her left arm ,just a little soreness in left    Pertinent History  Neoadjuvant chemotherapy getting through about 50% before having to stop, s/p lumpectomy on 09/23/18 [facility].  1/1 lymph nodes positive followed by ALND on 10/11/18 with all 7 nodes negative.  Completed radiation  ER/PR/HER2 negative  Pt had fracture of proximal humerus with ORIF on 01/31/2019    Patient Stated Goals  to get right arm better    Currently in Pain?  No/denies         [facility] - 03/21/19 0001      Assessment   Medical Diagnosis  left lumpectomy    Referring Provider (PT)  Dr. MJana Hakim Supple     Onset Date/Surgical Date  09/23/18   ORIF of right humerus 01/31/2019   Hand Dominance  Right    Next MD Visit  12/14/18    Prior Therapy  no      Precautions   Precaution Comments  left breast cancer  with ALND and radiation       Restrictions   Weight Bearing Restrictions  No      Balance Screen   Has the patient fallen in the past 6 months  Yes    How many times?  slipped on ramp with fall and humerus fracture    Has the patient had a decrease in activity level because of a fear of falling?   No    Is the patient reluctant to leave their home because of a fear of falling?   No      Home Social worker  Private residence    Living Arrangements  Spouse/significant other;Children    Available Help at Discharge  Family      Prior Function   Level of Independence  Independent    Vocation  Part time employment    Vocation Requirements  works for a [employer]    Leisure  wants to exercise      Cognition   Overall Cognitive Status  Within  Functional Limits for tasks assessed      Observation/Other Assessments   Observations  pt has well healed incision in anterior right shoulder from ORIF and is left axilla with darkened area in axilla and chest.  Incision around nipple is healed       Sensation   Additional Comments  numbess left under arm      Coordination   Gross Motor Movements are Fluid and Coordinated  Yes      Posture/Postural Control   Posture/Postural Control  Postural limitations    Postural Limitations  Rounded Shoulders;Forward head    Posture Comments  pt has more rounding at right shoulder       ROM / Strength   AROM / PROM / Strength  AROM;Strength      AROM   Overall AROM Comments  lacks final degrees of elbow extension with supination, decreased scapular stabalization with active active arm elevstion     Right Shoulder Flexion  78 Degrees    Right Shoulder ABduction  55 Degrees    Right Shoulder Internal Rotation  30 Degrees   in supine    Right Shoulder External Rotation  35 Degrees   in supine    Right Shoulder Horizontal ABduction  --    Left Shoulder Flexion  140 Degrees    Left Shoulder ABduction  145 Degrees    Left Shoulder Internal Rotation  --    Left Shoulder External Rotation  90 Degrees    Left Shoulder Horizontal ABduction  --      Strength   Overall Strength Comments  Pt has good strength to isometric testing around her shoulder with mild pain at upper lateral shoulder wiith effort. appears to have generalized muscle atrophy. pt with decreased strenthe of scapular muscles and is not able to isolate scapular protraction or retractions . She says "I've always been uncoordinated"     Strength Assessment Site  Shoulder    Right/Left Shoulder  Right;Left    Right Shoulder Flexion  2+/5    Right Shoulder ABduction  2-/5    Right Shoulder Internal Rotation  2+/5    Right Shoulder External Rotation  2+/5    Left Shoulder Flexion  4/5    Left Shoulder ABduction  4/5    Left Shoulder  Internal Rotation  4/5    Left Shoulder External Rotation  4/5        LYMPHEDEMA/ONCOLOGY QUESTIONNAIRE - 03/21/19 1321  Right Upper Extremity Lymphedema   10 cm Proximal to Olecranon Process  30 cm    Olecranon Process  24.3 cm    15 cm Proximal to Ulnar Styloid Process  24 cm    10 cm Proximal to Ulnar Styloid Process  21.3 cm    Just Proximal to Ulnar Styloid Process  15.5 cm    Across Hand at [employer]  18.5 cm    At [place] of 2nd Digit  6 cm      Left Upper Extremity Lymphedema   10 cm Proximal to Olecranon Process  29 cm    Olecranon Process  25.5 cm    15 cm Proximal to Ulnar Styloid Process  24 cm    10 cm Proximal to Ulnar Styloid Process  22 cm    Just Proximal to Ulnar Styloid Process  15.5 cm    Across Hand at [employer]  18 cm    At [place] of 2nd Digit  5.5 cm             Objective measurements completed on examination: See above findings.      [facility] Adult PT Treatment/Exercise - 03/21/19 0001      Exercises   Exercises  Shoulder;Elbow      Elbow Exercises   Elbow Extension  AROM;Right    Elbow Extension Limitations  hold stretch for 5 sec with elbow extended and forearm supinated      Shoulder Exercises: Supine   External Rotation  AROM;Right;5 reps    External Rotation Limitations  with folded towel under elbow for position     Internal Rotation  AROM;Right;5 reps    Other Supine Exercises  manual resisance isomertrics for activation of shoulder flex, extension, internal and external rotation              PT Education - 03/21/19 1751    Education Details  elbow extensiton stretch and shoulder active internal and external rotation in supine    Person(s) Educated  Patient    Methods  Explanation;Demonstration;Handout    Comprehension  Verbalized understanding;Returned demonstration          PT Long Term Goals - 03/21/19 1801      PT LONG TERM GOAL #1   Title  Pt will have 130 degrees of painfree right shoulder flexion  so that she will be able to do houshold chores easier    Time  6    Period  Weeks    Status  New      PT LONG TERM GOAL #2   Title  Pt will have 130 degrees of active painless right shoulder abduction so that she will be able to perform self care    Time  6    Period  Weeks    Status  New      PT LONG TERM GOAL #3   Title  Pt will be independent in a HEP for right shoulder ROM and strength    Time  6    Period  Weeks    Status  New             Plan - 03/21/19 1754    Clinical Impression Statement  Pt returns to PT for treatment to her right shoulder post humeral ORIF.  Her left shoulder and upper quadrant are doing will and are functional although she has decreased end range of left shoulder flexion and abduction. She does not have evidence of left UE lymphedema  this time.  Her right shoulder has decrased AROM with weakness in scapular muscles.  Focus of this episode of PT will focus on right shoulder ROM and strength with return to function of right UE    Personal Factors and Comorbidities  Comorbidity 2    Comorbidities  lymph node removal, radiation    Examination-Activity Limitations  Reach Overhead;Dressing;Hygiene/Grooming    Examination-Participation Restrictions  Yard Work;Cleaning;Laundry;Meal Prep    Stability/Clinical Decision Making  Stable/Uncomplicated    Clinical Decision Making  Low    Rehab Potential  Good    PT Frequency  2x / week    PT Duration  4 weeks    PT Treatment/Interventions  ADLs/Self Care Home Management;Manual lymph drainage;Patient/family education;Therapeutic exercise;Therapeutic activities;Passive range of motion;Scar mobilization;Manual techniques;Neuromuscular re-education;Taping    PT Next Visit Plan  Scapular muscle training and strengthening,  AROM with UE ranger, PROM as tolerated, Progress HEP for stregth and ROM    Consulted and Agree with Plan of Care  Patient       Patient will benefit from skilled therapeutic intervention in order  to improve the following deficits and impairments:  Decreased mobility, Impaired perceived functional ability, Decreased range of motion, Decreased activity tolerance, Decreased coordination, Decreased knowledge of use of DME, Decreased strength, Increased fascial restricitons, Impaired flexibility, Impaired UE functional use, Postural dysfunction, Pain  Visit Diagnosis: Abnormal posture - Plan: PT plan of care cert/re-cert  Stiffness of right shoulder, not elsewhere classified - Plan: PT plan of care cert/re-cert  Acute pain of right shoulder - Plan: PT plan of care cert/re-cert  Muscle weakness (generalized) - Plan: PT plan of care cert/re-cert     Problem List Patient Active Problem List   Diagnosis Date Noted  . Port-A-Cath in place 05/17/2018  . Genetic testing 05/10/2018  . Family history of melanoma   . Family history of kidney cancer   . Malignant neoplasm of upper-outer quadrant of left breast in female, estrogen receptor negative ([place]) 04/11/2018  . Chest pain 05/13/2015  . Shortness of breath 05/13/2015   Donato Heinz. Owens Shark PT  [place] Levo 03/21/2019, [place], [place], [address] Phone: [phone]   Fax:  [phone]  Name: Kathryn [name] MRN: [id number] Date of Birth: 10/13/1960

## 2019-03-21 NOTE — Patient Instructions (Signed)
Lie on your back.    Right arm down at side with elbow straight  turn hand over so palm is up and hold stretch for a count of 5   Folded towel under elbow so elbow is even with shoulder, elbow bent with hand to ceiling Keep wrist straight!  Roll arm out to side and hold stretch to count of 5, roll in toward belly and make shoulder does not pop up and hold stretch for a count of 5

## 2019-03-22 ENCOUNTER — Encounter: Payer: Self-pay | Admitting: Physical Therapy

## 2019-03-22 ENCOUNTER — Ambulatory Visit: Payer: BC Managed Care – PPO | Admitting: Physical Therapy

## 2019-03-22 DIAGNOSIS — M25611 Stiffness of right shoulder, not elsewhere classified: Secondary | ICD-10-CM | POA: Diagnosis not present

## 2019-03-22 DIAGNOSIS — M25511 Pain in right shoulder: Secondary | ICD-10-CM | POA: Diagnosis not present

## 2019-03-22 DIAGNOSIS — R293 Abnormal posture: Secondary | ICD-10-CM

## 2019-03-22 DIAGNOSIS — M6281 Muscle weakness (generalized): Secondary | ICD-10-CM

## 2019-03-22 NOTE — Therapy (Signed)
[facility] [address] [place], [place], [address] Phone: [phone]   Fax:  [phone]  Physical Therapy Treatment  Patient Details  Name: Kathryn [name] MRN: [id number] Date of Birth: 11/11/1960 Referring Provider (PT): Dr. Magrinat, Supple    Encounter Date: 03/22/2019  PT End of Session - 03/22/19 1512    Visit Number  2    Number of Visits  13    Date for PT Re-Evaluation  05/05/19    PT Start Time  1305    PT Stop Time  1348    PT Time Calculation (min)  43 min    Activity Tolerance  Patient tolerated treatment well    Behavior During Therapy  WFL for tasks assessed/performed       Past Medical History:  Diagnosis Date  . Breast cancer ([facility])    stage 3 - left  . Family history of kidney cancer   . Family history of melanoma   . Headache    hormone related, none since 1st child was born   . Pleurisy     Past Surgical History:  Procedure Laterality Date  . AXILLARY LYMPH NODE DISSECTION Left 10/11/2018   Procedure: LEFT AXILLARY LYMPH NODE DISSECTION;  Surgeon: Cornett, Thomas, MD;  [facility];  Service: General;  Laterality: Left;  . BREAST LUMPECTOMY WITH RADIOACTIVE SEED AND SENTINEL LYMPH NODE BIOPSY Left 09/23/2018   Procedure: LEFT BREAST LUMPECTOMY WITH RADIOACTIVE SEED AND LEFT AXILLARY TARGETED LYMPH NODE BIOPY AND  LEFT AXILLARY SENTINEL LYMPH NODE MAPPING;  Surgeon: Cornett, Thomas, MD;  Location: [facility];  Service: General;  Laterality: Left;  . CESAREAN SECTION     x3  . ORIF HUMERUS FRACTURE Right 01/31/2019   Procedure: OPEN REDUCTION INTERNAL FIXATION (ORIF) Right 3 part proximal humerus fracture;  Surgeon: Supple, Kevin, MD;  [facility];  Service: Orthopedics;  Laterality: Right;  120min  . PORTACATH PLACEMENT N/A 04/28/2018   Procedure: INSERTION PORT-A-CATH WITH ULTRASOUND;  Surgeon: Cornett, Thomas, MD;  [facility];  Service: General;  Laterality: N/A;     There were no vitals filed for this visit.  Subjective Assessment - 03/22/19 1307    Subjective  Pt states she was a little sore in front of arm by elbow . She did exercises with no provide    Pertinent History  Neoadjuvant chemotherapy getting through about 50% before having to stop, s/p lumpectomy on 09/23/18 due to IDC and DCIS.  1/1 lymph nodes positive followed by ALND on 10/11/18 with all 7 nodes negative.  Completed radiation  ER/PR/HER2 negative  Pt had fracture of proximal humerus with ORIF on 01/31/2019    Patient Stated Goals  to get right arm better    Currently in Pain?  Yes    Pain Score  2     Pain Location  Arm    Pain Orientation  Right    Pain Descriptors / Indicators  Sore    Pain Type  Acute pain                       [facility] Adult PT Treatment/Exercise - 03/22/19 0001      Exercises   Exercises  Shoulder      Elbow Exercises   Other elbow exercises  PROM to left elbow extension and supination       Shoulder Exercises: Supine   Protraction  AAROM;Right;Left;5 reps   with dowel    Horizontal   ABduction  AAROM;Right;5 reps    External Rotation  AROM;Right;10 reps    Internal Rotation  AROM;Right;10 reps    Flexion  AROM;Right;5 reps    ABduction  AROM;Right;5 reps    Other Supine Exercises  manual resisance isomertrics for activation of shoulder flex, extension, internal and external rotation       Shoulder Exercises: Seated   Other Seated Exercises  with UE ranger on floor and on wall for flex/ extension/ diagonals and ciricumduction x 10 reps in each direction     Other Seated Exercises  neck and scapular ROM       Shoulder Exercises: Therapy Ball   Flexion  Both;10 reps    Flexion Limitations  push into ball for closed chain activation       Manual Therapy   Manual Therapy  Passive ROM    Manual therapy comments  contract relax to increase shoulder external rotation ROM     Passive ROM  in supine, to right shoulder into felxion,  abduction and diagonal patterns                   PT Long Term Goals - 03/21/19 1801      PT LONG TERM GOAL #1   Title  Pt will have 130 degrees of painfree right shoulder flexion so that she will be able to do houshold chores easier    Time  6    Period  Weeks    Status  New      PT LONG TERM GOAL #2   Title  Pt will have 130 degrees of active painless right shoulder abduction so that she will be able to perform self care    Time  6    Period  Weeks    Status  New      PT LONG TERM GOAL #3   Title  Pt will be independent in a HEP for right shoulder ROM and strength    Time  6    Period  Weeks    Status  New            Plan - 03/22/19 1512    Clinical Impression Statement  focused on active movement of scapular and shoulder muscles today with pt demonstrating increased activity in scapular muscles, especailly retraction and protraction.  Used the mirror for visual cues for position of right shoulder to keep it symmetrical with left with left.  She had mild pain throughout session at proximal arm.  Careful  to use manual pressure duing PROM at proximal arm to avoid any pressure from long axis of presure at healing fracture site.  Pt will continue to work on active movment at home    Personal Factors and Comorbidities  Comorbidity 2    Comorbidities  lymph node removal, radiation    Examination-Participation Restrictions  Yard Work;Cleaning;Laundry;Meal Prep    Stability/Clinical Decision Making  Stable/Uncomplicated    Rehab Potential  Good    PT Frequency  2x / week    PT Duration  4 weeks    PT Treatment/Interventions  ADLs/Self Care Home Management;Manual lymph drainage;Patient/family education;Therapeutic exercise;Therapeutic activities;Passive range of motion;Scar mobilization;Manual techniques;Neuromuscular re-education;Taping    PT Next Visit Plan  continue Scapular muscle training and strengthening,  AROM with UE ranger, PROM as tolerated, Progress HEP for  stregth and ROM    Consulted and Agree with Plan of Care  Patient       Patient will benefit from skilled therapeutic intervention  in order to improve the following deficits and impairments:  Decreased mobility, Impaired perceived functional ability, Decreased range of motion, Decreased activity tolerance, Decreased coordination, Decreased knowledge of use of DME, Decreased strength, Increased fascial restricitons, Impaired flexibility, Impaired UE functional use, Postural dysfunction, Pain  Visit Diagnosis: Abnormal posture  Stiffness of right shoulder, not elsewhere classified  Acute pain of right shoulder  Muscle weakness (generalized)     Problem List Patient Active Problem List   Diagnosis Date Noted  . Port-A-Cath in place 05/17/2018  . Genetic testing 05/10/2018  . Family history of melanoma   . Family history of kidney cancer   . Malignant neoplasm of upper-outer quadrant of left breast in female, estrogen receptor negative ([facility]) 04/11/2018  . Chest pain 05/13/2015  . Shortness of breath 05/13/2015    K. , PT  ,  [place] 03/22/2019, 3:27 PM  [facility] [address] [place], [place], [address] Phone: [phone]   Fax:  [phone]  Name: Kathryn [name] MRN: [id number] Date of Birth: 06/09/1960   

## 2019-03-27 ENCOUNTER — Other Ambulatory Visit: Payer: Self-pay

## 2019-03-27 ENCOUNTER — Ambulatory Visit: Payer: BC Managed Care – PPO | Attending: Oncology | Admitting: Rehabilitation

## 2019-03-27 ENCOUNTER — Encounter: Payer: Self-pay | Admitting: Rehabilitation

## 2019-03-27 DIAGNOSIS — M25611 Stiffness of right shoulder, not elsewhere classified: Secondary | ICD-10-CM | POA: Insufficient documentation

## 2019-03-27 DIAGNOSIS — R293 Abnormal posture: Secondary | ICD-10-CM | POA: Insufficient documentation

## 2019-03-27 DIAGNOSIS — C50412 Malignant neoplasm of upper-outer quadrant of left female breast: Secondary | ICD-10-CM | POA: Insufficient documentation

## 2019-03-27 DIAGNOSIS — M25511 Pain in right shoulder: Secondary | ICD-10-CM | POA: Diagnosis not present

## 2019-03-27 DIAGNOSIS — M6281 Muscle weakness (generalized): Secondary | ICD-10-CM | POA: Diagnosis not present

## 2019-03-27 DIAGNOSIS — Z171 Estrogen receptor negative status [ER-]: Secondary | ICD-10-CM | POA: Diagnosis not present

## 2019-03-27 DIAGNOSIS — M25612 Stiffness of left shoulder, not elsewhere classified: Secondary | ICD-10-CM

## 2019-03-27 NOTE — Therapy (Signed)
[place], [place], [address] Phone: [phone]   Fax:  [phone]  Physical Therapy Treatment  Patient Details  Name: Kathryn [name] MRN: [id number] Date of Birth: 09-27-1960 Referring Provider (PT): Dr. Jana Hakim, Supple    Encounter Date: 03/27/2019  PT End of Session - 03/27/19 1007    Visit Number  3    Number of Visits  13    Date for PT Re-Evaluation  05/05/19    PT Start Time  0903    PT Stop Time  0947    PT Time Calculation (min)  44 min    Activity Tolerance  Patient tolerated treatment well    Behavior During Therapy  [facility] for tasks assessed/performed       Past Medical History:  Diagnosis Date  . Breast cancer ([place])    stage 3 - left  . Family history of kidney cancer   . Family history of melanoma   . Headache    hormone related, none since 1st child was born   . Pleurisy     Past Surgical History:  Procedure Laterality Date  . AXILLARY LYMPH NODE DISSECTION Left 10/11/2018   Procedure: LEFT AXILLARY LYMPH NODE DISSECTION;  Surgeon: Erroll Luna, MD;  Location: [place];  Service: General;  Laterality: Left;  . BREAST LUMPECTOMY WITH RADIOACTIVE SEED AND SENTINEL LYMPH NODE BIOPSY Left 09/23/2018   Procedure: LEFT BREAST LUMPECTOMY WITH RADIOACTIVE SEED AND LEFT AXILLARY TARGETED LYMPH NODE BIOPY AND  LEFT AXILLARY SENTINEL LYMPH NODE MAPPING;  Surgeon: Erroll Luna, MD;  Location: [place];  Service: General;  Laterality: Left;  . CESAREAN SECTION     x3  . ORIF HUMERUS FRACTURE Right 01/31/2019   Procedure: OPEN REDUCTION INTERNAL FIXATION (ORIF) Right 3 part proximal humerus fracture;  Surgeon: Justice Britain, MD;  [facility];  Service: Orthopedics;  Laterality: Right;  124mn  . PORTACATH PLACEMENT N/A 04/28/2018   Procedure: INSERTION PORT-A-CATH WITH ULTRASOUND;  Surgeon: CErroll Luna MD;  [place]  Service: General;  Laterality: N/A;     There were no vitals filed for this visit.  Subjective Assessment - 03/27/19 0904    Subjective  My arm is really sore today from helping my mom this weekend when her power went out    Pertinent History  Neoadjuvant chemotherapy getting through about 50% before having to stop, s/p [facility].  1/1 lymph nodes positive followed by ALND on 10/11/18 with all 7 nodes negative.  Completed radiation  ER/PR/HER2 negative  Pt had fracture of proximal humerus with ORIF on 01/31/2019    Patient Stated Goals  to get right arm better    Currently in Pain?  Yes    Pain Score  5     Pain Location  Shoulder    Pain Orientation  Right    Pain Descriptors / Indicators  Aching;Sore    Pain Type  Acute pain    Pain Onset  More than a month ago    Pain Frequency  Intermittent    Aggravating Factors   too much movement                       [facility] Adult PT Treatment/Exercise - 03/27/19 0001      Shoulder Exercises: Supine   Protraction  AROM;Both;10 reps    Protraction Limitations  with sig vcs and tcs for motion and able  to get it about 50%    External Rotation  AAROM;Right;10 reps    External Rotation Limitations  dowel with vcs to keep elbow bent and rested on towel    Flexion  AAROM;Both;10 reps    Flexion Limitations  with dowel    Other Supine Exercises  manual resisance isomertrics for activation of shoulder flex, extension, internal and external rotation 6" x 6       Shoulder Exercises: Standing   ABduction  AAROM;Right;10 reps    ABduction Limitations  with dowel and PT pressure on upper trap to keep shoulder depressed      Shoulder Exercises: Pulleys   Flexion  2 minutes    Flexion Limitations  with vcs to keep the elbow bent for more ease      Manual Therapy   Manual Therapy  Joint mobilization    Passive ROM  in supine to the right shoulder to tolerance into flexion, abduction, ER and IR at 45 deg of abduction with vcs for relaxation  throughout                  PT Long Term Goals - 03/21/19 1801      PT LONG TERM GOAL #1   Title  Pt will have 130 degrees of painfree right shoulder flexion so that she will be able to do houshold chores easier    Time  6    Period  Weeks    Status  New      PT LONG TERM GOAL #2   Title  Pt will have 130 degrees of active painless right shoulder abduction so that she will be able to perform self care    Time  6    Period  Weeks    Status  New      PT LONG TERM GOAL #3   Title  Pt will be independent in a HEP for right shoulder ROM and strength    Time  6    Period  Weeks    Status  New            Plan - 03/27/19 1007    Clinical Impression Statement  Focused on PROM/AAROM today with patient having almost adhesive capsulitis qualities during PROM today.  Still with difficulty isolating scapular movements but able to do a bit better in sidelying.  No pain with all PT today.    PT Frequency  2x / week    PT Duration  4 weeks    PT Treatment/Interventions  ADLs/Self Care Home Management;Manual lymph drainage;Patient/family education;Therapeutic exercise;Therapeutic activities;Passive range of motion;Scar mobilization;Manual techniques;Neuromuscular re-education;Taping    PT Next Visit Plan  update HEP, check ROM, continue Rt shoulder ROM and advance HEP    Consulted and Agree with Plan of Care  Patient       Patient will benefit from skilled therapeutic intervention in order to improve the following deficits and impairments:     Visit Diagnosis: Abnormal posture  Stiffness of right shoulder, not elsewhere classified  Acute pain of right shoulder  Muscle weakness (generalized)  Malignant neoplasm of upper-outer quadrant of left breast in female, estrogen receptor negative (HCC)  Stiffness of left shoulder, not elsewhere classified     Problem List Patient Active Problem List   Diagnosis Date Noted  . Port-A-Cath in place 05/17/2018  . Genetic  testing 05/10/2018  . Family history of melanoma   . Family history of kidney cancer   . Malignant neoplasm of upper-outer quadrant  of left breast in female, estrogen receptor negative ([place]) 04/11/2018  . Chest pain 05/13/2015  . Shortness of breath 05/13/2015    [name] 03/27/2019, 10:09 AM  [place], [place], [address] Phone: [phone]   Fax:  [phone]  Name: Kathryn [name] MRN: [id number] Date of Birth: 10-Jun-1960

## 2019-03-28 ENCOUNTER — Telehealth: Payer: Self-pay

## 2019-03-28 MED FILL — CAPECITABINE 500 MG TABLET: 500 | 1 days supply | Qty: 5 | Fill #0

## 2019-03-28 NOTE — Telephone Encounter (Signed)
Patient called yesterday unable to reach [facility] for Xeloda and stated she would be out of meds today 03/28/19. I did a conference call with [facility] and Kathryn Basheba, Seery set up delivery for Xeloda from [employer] today 03/28/19 by noon. Received a call from patient's husband that meds were not received. I called [facility] for [employer] tracking number [id number].  Package is on its way to sort facility in [place]. Kathryn [name] picked up 5 tablets of Xeloda from [facility] until she can track package down tomorrow.  [name] Phone [phone] Fax [phone] 03/28/2019 7:24 PM

## 2019-03-29 ENCOUNTER — Ambulatory Visit: Payer: BC Managed Care – PPO | Admitting: Rehabilitation

## 2019-03-29 ENCOUNTER — Encounter: Payer: Self-pay | Admitting: Rehabilitation

## 2019-03-29 ENCOUNTER — Telehealth: Payer: Self-pay | Admitting: *Deleted

## 2019-03-29 ENCOUNTER — Other Ambulatory Visit: Payer: Self-pay

## 2019-03-29 DIAGNOSIS — M6281 Muscle weakness (generalized): Secondary | ICD-10-CM | POA: Diagnosis not present

## 2019-03-29 DIAGNOSIS — M25511 Pain in right shoulder: Secondary | ICD-10-CM

## 2019-03-29 DIAGNOSIS — M25612 Stiffness of left shoulder, not elsewhere classified: Secondary | ICD-10-CM | POA: Diagnosis not present

## 2019-03-29 DIAGNOSIS — M25611 Stiffness of right shoulder, not elsewhere classified: Secondary | ICD-10-CM

## 2019-03-29 DIAGNOSIS — Z171 Estrogen receptor negative status [ER-]: Secondary | ICD-10-CM | POA: Diagnosis not present

## 2019-03-29 DIAGNOSIS — R293 Abnormal posture: Secondary | ICD-10-CM

## 2019-03-29 DIAGNOSIS — C50412 Malignant neoplasm of upper-outer quadrant of left female breast: Secondary | ICD-10-CM

## 2019-03-29 NOTE — Patient Instructions (Signed)
Access Code: [email or web address]  URL: [email or web address]  Date: 03/29/2019  Prepared by: Shan Levans   Exercises  Supine Shoulder Flexion Extension AAROM with Dowel - 10 reps - 5 seconds hold - 1x daily - 7x weekly  Shoulder Flexion Overhead with Dowel - 10 reps - 1 sets - 5 second hold - 1x daily - 7x weekly  Standing Shoulder Abduction ROM with Dowel - 10 reps - 1 sets - 5 second hold - 1x daily - 7x weekly  Supine Chest Stretch with Elbows Bent - 3 reps - 1 sets - 30-60seconds hold - 1x daily - 7x weekly  Sidelying Shoulder External Rotation - 10 reps - 1-3 sets - 1x daily - 7x weekly  Supine Bilateral Shoulder Protraction - 10 reps - 1 sets - 1x daily - 7x weekly  Doorway Pec Stretch at 90 Degrees Abduction - 2 reps - 1 sets - 20-30 seconds hold - 1x daily - 7x weekly

## 2019-03-29 NOTE — Therapy (Signed)
[place], [place], [address] Phone: [phone]   Fax:  [phone]  Physical Therapy Treatment  Patient Details  Name: Kathryn [name] MRN: [id number] Date of Birth: [birth year] Referring Provider (PT): Dr. Jana Hakim, Supple    Encounter Date: 03/29/2019  PT End of Session - 03/29/19 1148    Visit Number  4    Number of Visits  13    Date for PT Re-Evaluation  05/05/19    PT Start Time  1100    PT Stop Time  1146    PT Time Calculation (min)  46 min    Activity Tolerance  Patient tolerated treatment well    Behavior During Therapy  [facility] for tasks assessed/performed       Past Medical History:  Diagnosis Date  . Breast cancer ([place])    stage 3 - left  . Family history of kidney cancer   . Family history of melanoma   . Headache    hormone related, none since 1st child was born   . Pleurisy     Past Surgical History:  Procedure Laterality Date  . AXILLARY LYMPH NODE DISSECTION Left 10/11/2018   Procedure: LEFT AXILLARY LYMPH NODE DISSECTION;  Surgeon: Erroll Luna, MD;  Location: [place];  Service: General;  Laterality: Left;  . BREAST LUMPECTOMY WITH RADIOACTIVE SEED AND SENTINEL LYMPH NODE BIOPSY Left 09/23/2018   Procedure: LEFT BREAST LUMPECTOMY WITH RADIOACTIVE SEED AND LEFT AXILLARY TARGETED LYMPH NODE BIOPY AND  LEFT AXILLARY SENTINEL LYMPH NODE MAPPING;  Surgeon: Erroll Luna, MD;  Location: [place];  Service: General;  Laterality: Left;  . CESAREAN SECTION     x3  . ORIF HUMERUS FRACTURE Right 01/31/2019   Procedure: OPEN REDUCTION INTERNAL FIXATION (ORIF) Right 3 part proximal humerus fracture;  Surgeon: Justice Britain, MD;  [facility];  Service: Orthopedics;  Laterality: Right;  165mn  . PORTACATH PLACEMENT N/A 04/28/2018   Procedure: INSERTION PORT-A-CATH WITH ULTRASOUND;  Surgeon: CErroll Luna MD;  [facility]  Service: General;  Laterality: N/A;     There were no vitals filed for this visit.  Subjective Assessment - 03/29/19 1052    Subjective  I am a little sore in the front of the shoulder    Pertinent History  Neoadjuvant chemotherapy getting through about 50% before having to stop, s/p lumpectomy on 09/23/18 [facility].  1/1 lymph nodes positive followed by ALND on 10/11/18 with all 7 nodes negative.  Completed radiation  ER/PR/HER2 negative  Pt had fracture of proximal humerus with ORIF on 01/31/2019    Patient Stated Goals  to get right arm better    Currently in Pain?  Yes    Pain Score  3     Pain Location  Shoulder    Pain Orientation  Right    Pain Descriptors / Indicators  Aching                       [facility] Adult PT Treatment/Exercise - 03/29/19 0001      Shoulder Exercises: Supine   Protraction  AROM;Both;10 reps    Protraction Limitations  with dowel    Flexion  AAROM;Both;10 reps    Shoulder Flexion Weight (lbs)  (add weight next time)    Flexion Limitations  with dowel    Other Supine Exercises  manual resisance isomertrics for activation of shoulder flex, extension, internal and external rotation 6" x  6       Shoulder Exercises: Sidelying   External Rotation  Right;10 reps    External Rotation Limitations  with towel roll under the arm      Shoulder Exercises: Standing   Other Standing Exercises  wall ladder flexion x 5       Shoulder Exercises: Pulleys   Flexion  2 minutes    Flexion Limitations  with vcs to keep the elbow bent for more ease    Scaption  2 minutes    Scaption Limitations  with as much scaption as possible      Shoulder Exercises: Stretch   Other Shoulder Stretches  hand behind the head ER/Abd 5" x 10      Manual Therapy   Passive ROM  in supine to the right shoulder to tolerance into flexion, abduction, ER and IR at 45 deg of abduction with vcs for relaxation throughout                  PT Long Term Goals - 03/21/19 1801      PT LONG TERM GOAL  #1   Title  Pt will have 130 degrees of painfree right shoulder flexion so that she will be able to do houshold chores easier    Time  6    Period  Weeks    Status  New      PT LONG TERM GOAL #2   Title  Pt will have 130 degrees of active painless right shoulder abduction so that she will be able to perform self care    Time  6    Period  Weeks    Status  New      PT LONG TERM GOAL #3   Title  Pt will be independent in a HEP for right shoulder ROM and strength    Time  6    Period  Weeks    Status  New            Plan - 03/29/19 1148    Clinical Impression Statement  Continued PROM and AAROM focus and updated HEP.  Able to isolate scapular movements today better in supine.  Still with some frozen shoulder appearance.  Pt will be past 8 weeks as of yesterday and may start some more gentle strengthening next week    PT Frequency  2x / week    PT Duration  4 weeks    PT Treatment/Interventions  ADLs/Self Care Home Management;Manual lymph drainage;Patient/family education;Therapeutic exercise;Therapeutic activities;Passive range of motion;Scar mobilization;Manual techniques;Neuromuscular re-education;Taping    PT Next Visit Plan  check ROM, continue Rt shoulder ROM and advance HEP PRN    Consulted and Agree with Plan of Care  Patient       Patient will benefit from skilled therapeutic intervention in order to improve the following deficits and impairments:  Decreased mobility, Impaired perceived functional ability, Decreased range of motion, Decreased activity tolerance, Decreased coordination, Decreased knowledge of use of DME, Decreased strength, Increased fascial restricitons, Impaired flexibility, Impaired UE functional use, Postural dysfunction, Pain  Visit Diagnosis: Abnormal posture  Stiffness of right shoulder, not elsewhere classified  Acute pain of right shoulder  Muscle weakness (generalized)  Malignant neoplasm of upper-outer quadrant of left breast in female,  estrogen receptor negative (HCC)  Stiffness of left shoulder, not elsewhere classified     Problem List Patient Active Problem List   Diagnosis Date Noted  . Port-A-Cath in place 05/17/2018  . Genetic testing 05/10/2018  .  Family history of melanoma   . Family history of kidney cancer   . Malignant neoplasm of upper-outer quadrant of left breast in female, estrogen receptor negative ([place]) 04/11/2018  . Chest pain 05/13/2015  . Shortness of breath 05/13/2015    [name] 03/29/2019, 11:53 AM  [place], [place], [address] Phone: [phone]   Fax:  [phone]  Name: MARITES NATH MRN: [id number] Date of Birth: Jun 29, 1960

## 2019-03-29 NOTE — Telephone Encounter (Signed)
VM left by the patient's husband stating they have received the medication shipment from [place] today.

## 2019-04-03 ENCOUNTER — Ambulatory Visit: Payer: BC Managed Care – PPO

## 2019-04-03 ENCOUNTER — Other Ambulatory Visit: Payer: Self-pay

## 2019-04-03 ENCOUNTER — Ambulatory Visit
Admission: RE | Admit: 2019-04-03 | Discharge: 2019-04-03 | Disposition: A | Discharge status: Home / Self Care | Payer: BC Managed Care – PPO | Source: Ambulatory Visit | Attending: Oncology | Admitting: Oncology

## 2019-04-03 DIAGNOSIS — M6281 Muscle weakness (generalized): Secondary | ICD-10-CM

## 2019-04-03 DIAGNOSIS — Z171 Estrogen receptor negative status [ER-]: Secondary | ICD-10-CM | POA: Diagnosis not present

## 2019-04-03 DIAGNOSIS — Z853 Personal history of malignant neoplasm of breast: Secondary | ICD-10-CM | POA: Diagnosis not present

## 2019-04-03 DIAGNOSIS — M25611 Stiffness of right shoulder, not elsewhere classified: Secondary | ICD-10-CM | POA: Diagnosis not present

## 2019-04-03 DIAGNOSIS — C50412 Malignant neoplasm of upper-outer quadrant of left female breast: Secondary | ICD-10-CM

## 2019-04-03 DIAGNOSIS — M25511 Pain in right shoulder: Secondary | ICD-10-CM | POA: Diagnosis not present

## 2019-04-03 DIAGNOSIS — M25612 Stiffness of left shoulder, not elsewhere classified: Secondary | ICD-10-CM | POA: Diagnosis not present

## 2019-04-03 DIAGNOSIS — R293 Abnormal posture: Secondary | ICD-10-CM | POA: Diagnosis not present

## 2019-04-03 DIAGNOSIS — R922 Inconclusive mammogram: Secondary | ICD-10-CM | POA: Diagnosis not present

## 2019-04-03 HISTORY — DX: Personal history of antineoplastic chemotherapy: Z92.21

## 2019-04-03 HISTORY — DX: Personal history of irradiation: Z92.3

## 2019-04-03 NOTE — Therapy (Signed)
[place], [place], [address] Phone: [phone]   Fax:  [phone]  Physical Therapy Treatment  Patient Details  Name: Kathryn [name] MRN: [id number] Date of Birth: [birth year] Referring Provider (PT): Dr. Jana Hakim, Supple    Encounter Date: 04/03/2019  PT End of Session - 04/03/19 0857    Visit Number  5    Number of Visits  13    Date for PT Re-Evaluation  05/05/19    PT Start Time  0807   pt arrived late   PT Stop Time  0851   pt had to leave early for mammogram appt   PT Time Calculation (min)  44 min    Activity Tolerance  Patient tolerated treatment well    Behavior During Therapy  [facility] for tasks assessed/performed       Past Medical History:  Diagnosis Date  . Breast cancer ([place])    stage 3 - left  . Family history of kidney cancer   . Family history of melanoma   . Headache    hormone related, none since 1st child was born   . Pleurisy     Past Surgical History:  Procedure Laterality Date  . AXILLARY LYMPH NODE DISSECTION Left 10/11/2018   Procedure: LEFT AXILLARY LYMPH NODE DISSECTION;  Surgeon: Erroll Luna, MD;  Location: [place];  Service: General;  Laterality: Left;  . BREAST LUMPECTOMY WITH RADIOACTIVE SEED AND SENTINEL LYMPH NODE BIOPSY Left 09/23/2018   Procedure: LEFT BREAST LUMPECTOMY WITH RADIOACTIVE SEED AND LEFT AXILLARY TARGETED LYMPH NODE BIOPY AND  LEFT AXILLARY SENTINEL LYMPH NODE MAPPING;  Surgeon: Erroll Luna, MD;  Location: [place];  Service: General;  Laterality: Left;  . CESAREAN SECTION     x3  . ORIF HUMERUS FRACTURE Right 01/31/2019   Procedure: OPEN REDUCTION INTERNAL FIXATION (ORIF) Right 3 part proximal humerus fracture;  Surgeon: Justice Britain, MD;  [facility];  Service: Orthopedics;  Laterality: Right;  180mn  . PORTACATH PLACEMENT N/A 04/28/2018   Procedure: INSERTION PORT-A-CATH WITH ULTRASOUND;  Surgeon: CErroll Luna MD;   [facility] [place]  Service: General;  Laterality: N/A;    There were no vitals filed for this visit.  Subjective Assessment - 04/03/19 0810    Subjective  My Rt upper arm is sore this morning, but not worse than usual. I forgot to take my Tylenol before I came. And I have to leave early (0850) for a mammogram. I drove some on Friday to the [place] and my arm was really sore after, but they felt much better after I did the HEP exercises.    Pertinent History  Neoadjuvant chemotherapy getting through about 50% before having to stop, s/p lumpectomy on [facility]/23/[facility].  1/1 lymph nodes positive followed by ALND on 10/11/18 with all 7 nodes negative.  Completed radiation  ER/PR/HER2 negative  Pt had fracture of proximal humerus with ORIF on 01/31/2019    Patient Stated Goals  to get right arm better    Currently in Pain?  Yes    Pain Score  3     Pain Location  Arm    Pain Orientation  Right;Upper    Pain Descriptors / Indicators  Aching;Other (Comment)   pulls   Pain Type  Chronic pain    Pain Onset  More than a month ago    Pain Frequency  Intermittent    Aggravating Factors   moving a certain way  Pain Relieving Factors  HEP exs and Tylenol                       [facility] Adult PT Treatment/Exercise - 04/03/19 0001      Shoulder Exercises: Standing   Other Standing Exercises  Forearm walking on wall 5x each side returning therapist demo; then with forearms on wall for starting position for scapular retraction 5x each with VCs for good scapular contraction at end of motion.      Shoulder Exercises: Pulleys   Flexion  2 minutes    Flexion Limitations  Tactile cues to decrease Rt scapular compensation    Scaption  2 minutes    Scaption Limitations  with as much scaption as possible; VCs to decrease Rt scapular compensation      Shoulder Exercises: Therapy Ball   Flexion  Both;10 reps   forward lean into end of stretch   Flexion Limitations  VCs to stop when  shoulder beginning to hike      Shoulder Exercises: Stretch   Wall Stretch - ABduction  5 reps;Other (comment)   scaption with finger ladder   Wall Stretch - ABduction Limitations  VCs for pt to place her Lt hand over Rt shoulder for tactile cuing to stop when feeling shoulder begin to hike, pt was then able to return good demo with correct technique      Manual Therapy   Manual Therapy  Soft tissue mobilization;Passive ROM    Soft tissue mobilization  With thick massage cream to Rt upper arm for trigger point release in multiple areas here    Passive ROM  in supine to the right shoulder to tolerance into flexion, abduction, ER and IR at 45 deg of abduction with vcs for relaxation throughout, pt very limited by tightness at all end motions                  PT Long Term Goals - 03/21/19 1801      PT LONG TERM GOAL #1   Title  Pt will have 130 degrees of painfree right shoulder flexion so that she will be able to do houshold chores easier    Time  6    Period  Weeks    Status  New      PT LONG TERM GOAL #2   Title  Pt will have 130 degrees of active painless right shoulder abduction so that she will be able to perform self care    Time  6    Period  Weeks    Status  New      PT LONG TERM GOAL #3   Title  Pt will be independent in a HEP for right shoulder ROM and strength    Time  6    Period  Weeks    Status  New            Plan - 04/03/19 4081    Clinical Impression Statement  Continued with AA/and P/ROM, including manual therapy to Rt upper arm with P/ROM today. Also added gentle scapular stability exercises with no resistance, which pt tolerated very well without increased pain, just some discomfort in upper arm from "muscles working". Continues with appearance of frozen shoulder but pt reports achiness in arm decreased by end of session.    Personal Factors and Comorbidities  Comorbidity 2    Comorbidities  lymph node removal, radiation     Examination-Activity Limitations  Reach Overhead;Dressing;Hygiene/Grooming  Examination-Participation Restrictions  Yard Work;Cleaning;Laundry;Meal Prep    Stability/Clinical Decision Making  Stable/Uncomplicated    Rehab Potential  Good    PT Frequency  2x / week    PT Duration  4 weeks    PT Treatment/Interventions  ADLs/Self Care Home Management;Manual lymph drainage;Patient/family education;Therapeutic exercise;Therapeutic activities;Passive range of motion;Scar mobilization;Manual techniques;Neuromuscular re-education;Taping    PT Next Visit Plan  check ROM, continue Rt shoulder ROM and advance HEP PRN (forearms on wall if she continues to tolerate well, add yellow theraband??)    Consulted and Agree with Plan of Care  Patient       Patient will benefit from skilled therapeutic intervention in order to improve the following deficits and impairments:  Decreased mobility, Impaired perceived functional ability, Decreased range of motion, Decreased activity tolerance, Decreased coordination, Decreased knowledge of use of DME, Decreased strength, Increased fascial restricitons, Impaired flexibility, Impaired UE functional use, Postural dysfunction, Pain  Visit Diagnosis: Abnormal posture  Stiffness of right shoulder, not elsewhere classified  Acute pain of right shoulder  Muscle weakness (generalized)     Problem List Patient Active Problem List   Diagnosis Date Noted  . Port-A-Cath in place 05/17/2018  . Genetic testing 05/10/2018  . Family history of melanoma   . Family history of kidney cancer   . Malignant neoplasm of upper-outer quadrant of left breast in female, estrogen receptor negative ([place]) 04/11/2018  . Chest pain 05/13/2015  . Shortness of breath 05/13/2015    Otelia Limes, PTA 04/03/2019, 9:01 AM  [place], [place], [address] Phone: [phone]   Fax:   [phone]  Name: SAMI ROES MRN: [id number] Date of Birth: Nov 01, 1960
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
## 2019-04-05 ENCOUNTER — Encounter: Payer: Self-pay | Admitting: Oncology

## 2019-04-05 ENCOUNTER — Inpatient Hospital Stay: Payer: BC Managed Care – PPO | Attending: Oncology | Admitting: Oncology

## 2019-04-05 ENCOUNTER — Other Ambulatory Visit: Payer: Self-pay

## 2019-04-05 ENCOUNTER — Inpatient Hospital Stay: Payer: BC Managed Care – PPO

## 2019-04-05 ENCOUNTER — Ambulatory Visit: Payer: BC Managed Care – PPO

## 2019-04-05 VITALS — BP 133/79 | HR 103 | Temp 97.3°F | Resp 18 | Ht 64.0 in | Wt 149.5 lb

## 2019-04-05 DIAGNOSIS — C50412 Malignant neoplasm of upper-outer quadrant of left female breast: Secondary | ICD-10-CM | POA: Diagnosis not present

## 2019-04-05 DIAGNOSIS — Z791 Long term (current) use of non-steroidal anti-inflammatories (NSAID): Secondary | ICD-10-CM | POA: Insufficient documentation

## 2019-04-05 DIAGNOSIS — M6281 Muscle weakness (generalized): Secondary | ICD-10-CM | POA: Diagnosis not present

## 2019-04-05 DIAGNOSIS — M25612 Stiffness of left shoulder, not elsewhere classified: Secondary | ICD-10-CM | POA: Diagnosis not present

## 2019-04-05 DIAGNOSIS — Z8249 Family history of ischemic heart disease and other diseases of the circulatory system: Secondary | ICD-10-CM | POA: Diagnosis not present

## 2019-04-05 DIAGNOSIS — Z8051 Family history of malignant neoplasm of kidney: Secondary | ICD-10-CM | POA: Insufficient documentation

## 2019-04-05 DIAGNOSIS — Z171 Estrogen receptor negative status [ER-]: Secondary | ICD-10-CM

## 2019-04-05 DIAGNOSIS — Z923 Personal history of irradiation: Secondary | ICD-10-CM | POA: Insufficient documentation

## 2019-04-05 DIAGNOSIS — M25611 Stiffness of right shoulder, not elsewhere classified: Secondary | ICD-10-CM

## 2019-04-05 DIAGNOSIS — Z79899 Other long term (current) drug therapy: Secondary | ICD-10-CM | POA: Diagnosis not present

## 2019-04-05 DIAGNOSIS — R293 Abnormal posture: Secondary | ICD-10-CM

## 2019-04-05 DIAGNOSIS — M25511 Pain in right shoulder: Secondary | ICD-10-CM | POA: Diagnosis not present

## 2019-04-05 DIAGNOSIS — Z801 Family history of malignant neoplasm of trachea, bronchus and lung: Secondary | ICD-10-CM | POA: Diagnosis not present

## 2019-04-05 DIAGNOSIS — Z9221 Personal history of antineoplastic chemotherapy: Secondary | ICD-10-CM | POA: Diagnosis not present

## 2019-04-05 LAB — CBC WITH DIFFERENTIAL/PLATELET
Abs Immature Granulocytes: 0.01 10*3/uL (ref 0.00–0.07)
Basophils Absolute: 0 10*3/uL (ref 0.0–0.1)
Basophils Relative: 1 %
Eosinophils Absolute: 0.1 10*3/uL (ref 0.0–0.5)
Eosinophils Relative: 1 %
HCT: 37.2 % (ref 36.0–46.0)
Hemoglobin: 12 g/dL (ref 12.0–15.0)
Immature Granulocytes: 0 %
Lymphocytes Relative: 23 %
Lymphs Abs: 1.2 10*3/uL (ref 0.7–4.0)
MCH: 33 pg (ref 26.0–34.0)
MCHC: 32.3 g/dL (ref 30.0–36.0)
MCV: 102.2 fL — ABNORMAL HIGH (ref 80.0–100.0)
Monocytes Absolute: 0.5 10*3/uL (ref 0.1–1.0)
Monocytes Relative: 10 %
Neutro Abs: 3.2 10*3/uL (ref 1.7–7.7)
Neutrophils Relative %: 65 %
Platelets: 193 10*3/uL (ref 150–400)
RBC: 3.64 MIL/uL — ABNORMAL LOW (ref 3.87–5.11)
RDW: 16.4 % — ABNORMAL HIGH (ref 11.5–15.5)
WBC: 5 10*3/uL (ref 4.0–10.5)
nRBC: 0 % (ref 0.0–0.2)

## 2019-04-05 LAB — COMPREHENSIVE METABOLIC PANEL
ALT: 27 U/L (ref 0–44)
AST: 23 U/L (ref 15–41)
Albumin: 4.1 g/dL (ref 3.5–5.0)
Alkaline Phosphatase: 105 U/L (ref 38–126)
Anion gap: 11 (ref 5–15)
BUN: 8 mg/dL (ref 6–20)
CO2: 25 mmol/L (ref 22–32)
Calcium: 9 mg/dL (ref 8.9–10.3)
Chloride: 106 mmol/L (ref 98–111)
Creatinine, Ser: 0.77 mg/dL (ref 0.44–1.00)
GFR calc Af Amer: >60 mL/min (ref >60)
GFR calc non Af Amer: >60 mL/min (ref >60)
Glucose, Bld: 92 mg/dL (ref 70–99)
Potassium: 4.2 mmol/L (ref 3.5–5.1)
Sodium: 142 mmol/L (ref 135–145)
Total Bilirubin: 0.5 mg/dL (ref 0.3–1.2)
Total Protein: 7 g/dL (ref 6.5–8.1)

## 2019-04-05 NOTE — Therapy (Signed)
[place], [place], [address] Phone: [phone]   Fax:  [phone]  Physical Therapy Treatment  Patient Details  Name: Kathryn [name] MRN: [id number] Date of Birth: 09/10/1960 Referring Provider (PT): Dr. Jana Hakim, [place]    Encounter Date: 04/05/2019  PT End of Session - 04/05/19 1006    Visit Number  6    Number of Visits  13    Date for PT Re-Evaluation  05/05/19    PT Start Time  0905    PT Stop Time  0959    PT Time Calculation (min)  54 min    Activity Tolerance  Patient tolerated treatment well    Behavior During Therapy  [facility] for tasks assessed/performed       Past Medical History:  Diagnosis Date  . Breast cancer ([place])    stage 3 - left  . Family history of kidney cancer   . Family history of melanoma   . Headache    hormone related, none since 1st child was born   . Personal history of chemotherapy   . Personal history of radiation therapy   . Pleurisy     Past Surgical History:  Procedure Laterality Date  . AXILLARY LYMPH NODE DISSECTION Left 10/11/2018   Procedure: LEFT AXILLARY LYMPH NODE DISSECTION;  Surgeon: Erroll Luna, MD;  Location: [facility];  Service: General;  Laterality: Left;  . BREAST LUMPECTOMY Left 08/2018  . BREAST LUMPECTOMY WITH RADIOACTIVE SEED AND SENTINEL LYMPH NODE BIOPSY Left 09/23/2018   Procedure: LEFT BREAST LUMPECTOMY WITH RADIOACTIVE SEED AND LEFT AXILLARY TARGETED LYMPH NODE BIOPY AND  LEFT AXILLARY SENTINEL LYMPH NODE MAPPING;  Surgeon: Erroll Luna, MD;  Location: [facility];  Service: General;  Laterality: Left;  . CESAREAN SECTION     x3  . ORIF HUMERUS FRACTURE Right 01/31/2019   Procedure: OPEN REDUCTION INTERNAL FIXATION (ORIF) Right 3 part proximal humerus fracture;  Surgeon: Justice Britain, MD;  [facility];  Service: Orthopedics;  Laterality: Right;  170mn  . PORTACATH PLACEMENT N/A 04/28/2018   Procedure: INSERTION  PORT-A-CATH WITH ULTRASOUND;  Surgeon: CErroll Luna MD;  [facility]  Service: General;  Laterality: N/A;    There were no vitals filed for this visit.  Subjective Assessment - 04/05/19 0909    Subjective  My Rt arm was pretty sore yesterday. 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 I think my arm just got overused. But the massage you did last time felt really good.    Pertinent History  Neoadjuvant chemotherapy getting through about 50% before having to stop, s/[facility]  1/1 lymph nodes positive followed by ALND on 10/11/18 with all 7 nodes negative.  Completed radiation  ER/PR/HER2 negative  Pt had fracture of proximal humerus with ORIF on 01/31/2019    Patient Stated Goals  to get right arm better    Currently in Pain?  Yes    Pain Score  3     Pain Location  Arm    Pain Orientation  Right;Upper    Pain Descriptors / Indicators  Aching    Pain Type  Chronic pain    Pain Radiating Towards  Rt side of neck    Pain Onset  More than a month ago  Pain Frequency  Intermittent    Aggravating Factors   think I just overdid on Monday between therapy and work    Pain Relieving Factors  HEP exs, Tylenol                       [facility] Adult PT Treatment/Exercise - 04/05/19 0001      Manual Therapy   Soft tissue mobilization  With thick massage cream to Rt upper arm and upper trap for trigger point release in multiple areas here but less than last session and tenderness improved, then rubbed biofreeze onto upper arm to upper trap at end of session    Passive ROM  in supine to the right shoulder to tolerance into flexion, abduction, ER and IR at 45 deg of abduction with vcs for relaxation throughout, pt very limited by tightness at all end motions                  PT Long Term Goals - 03/21/19 1801       PT LONG TERM GOAL #1   Title  Pt will have 130 degrees of painfree right shoulder flexion so that she will be able to do houshold chores easier    Time  6    Period  Weeks    Status  New      PT LONG TERM GOAL #2   Title  Pt will have 130 degrees of active painless right shoulder abduction so that she will be able to perform self care    Time  6    Period  Weeks    Status  New      PT LONG TERM GOAL #3   Title  Pt will be independent in a HEP for right shoulder ROM and strength    Time  6    Period  Weeks    Status  New            Plan - 04/05/19 1007    Clinical Impression Statement  Focused on manual therapy today continuing with soft tissue mobs to trigger pointd, which were improved today, at Rt upper arm in bicep. Then continued with P/ROM of Rt shoulder. Pts soreness from last session seems due to after therapy, had a mammogram having to reach her arm then using her arm repetitively at work doing paperwork. So just focused on gentle manaul therapy today and ended with biofreeze to her Rt upper arm.    Personal Factors and Comorbidities  Comorbidity 2    Comorbidities  lymph node removal, radiation    Examination-Activity Limitations  Reach Overhead;Dressing;Hygiene/Grooming    Examination-Participation Restrictions  Yard Work;Cleaning;Laundry;Meal Prep    Stability/Clinical Decision Making  Stable/Uncomplicated    Rehab Potential  Good    PT Frequency  2x / week    PT Duration  4 weeks    PT Treatment/Interventions  ADLs/Self Care Home Management;Manual lymph drainage;Patient/family education;Therapeutic exercise;Therapeutic activities;Passive range of motion;Scar mobilization;Manual techniques;Neuromuscular re-education;Taping    PT Next Visit Plan  check ROM, continue Rt shoulder ROM and advance HEP PRN; okay to add isometrics?    Consulted and Agree with Plan of Care  Patient       Patient will benefit from skilled therapeutic intervention in order to improve the  following deficits and impairments:  Decreased mobility, Impaired perceived functional ability, Decreased range of motion, Decreased activity tolerance, Decreased coordination, Decreased knowledge of use of DME, Decreased strength, Increased fascial restricitons, Impaired flexibility, Impaired  UE functional use, Postural dysfunction, Pain  Visit Diagnosis: Abnormal posture  Stiffness of right shoulder, not elsewhere classified  Acute pain of right shoulder  Muscle weakness (generalized)     Problem List Patient Active Problem List   Diagnosis Date Noted  . Port-A-Cath in place 05/17/2018  . Genetic testing 05/10/2018  . Family history of melanoma   . Family history of kidney cancer   . Malignant neoplasm of upper-outer quadrant of left breast in female, estrogen receptor negative ([place]) 04/11/2018  . Chest pain 05/13/2015  . Shortness of breath 05/13/2015    Otelia Limes, PTA 04/05/2019, 11:23 AM  [place], [place], [address] Phone: [phone]   Fax:  [phone]  Name: Kathryn [name] MRN: [id number] Date of Birth: 1960/08/05

## 2019-04-06 ENCOUNTER — Telehealth: Payer: Self-pay | Admitting: Oncology

## 2019-04-06 NOTE — Telephone Encounter (Signed)
I talk with patient regarding schedule  

## 2019-04-10 ENCOUNTER — Encounter: Payer: BC Managed Care – PPO | Admitting: Physical Therapy

## 2019-04-11 NOTE — Progress Notes (Signed)
  Radiation Oncology         [phone] ________________________________  Name: ELEXA LATZKE MRN: [id number]  Date: 01/31/2019  DOB: [birth year]  End of Treatment Note  Diagnosis:   left-sided breast cancer     Indication for treatment:  Curative       Radiation treatment dates:   12/14/18 - 01/31/19  Site/dose:   The patient initially received a dose of 50.4 Gy in 28 fractions to the breast and SCLV region using a 4-field approach. This was delivered using a 3-D conformal technique. The patient then received a boost to the seroma. This delivered an additional 10 Gy in 5 fractions using a 3-field photon boost technique. The total dose was 60.4 Gy.  Narrative: The patient tolerated radiation treatment relatively well.   The patient had some expected skin irritation as she progressed during treatment. Moist desquamation was not present at the end of treatment.  Plan: The patient has completed radiation treatment. The patient will return to radiation oncology clinic for routine followup in one month. I advised the patient to call or return sooner if they have any questions or concerns related to their recovery or treatment. ________________________________  Jodelle Gross, M.D., Ph.D.

## 2019-04-12 ENCOUNTER — Encounter: Payer: BC Managed Care – PPO | Admitting: Rehabilitation

## 2019-04-17 ENCOUNTER — Other Ambulatory Visit: Payer: Self-pay

## 2019-04-17 ENCOUNTER — Encounter: Payer: Self-pay | Admitting: Rehabilitation

## 2019-04-17 ENCOUNTER — Encounter: Payer: Self-pay | Admitting: Oncology

## 2019-04-17 ENCOUNTER — Ambulatory Visit: Payer: BC Managed Care – PPO | Admitting: Rehabilitation

## 2019-04-17 DIAGNOSIS — R293 Abnormal posture: Secondary | ICD-10-CM

## 2019-04-17 DIAGNOSIS — M25611 Stiffness of right shoulder, not elsewhere classified: Secondary | ICD-10-CM | POA: Diagnosis not present

## 2019-04-17 DIAGNOSIS — M25511 Pain in right shoulder: Secondary | ICD-10-CM

## 2019-04-17 DIAGNOSIS — M6281 Muscle weakness (generalized): Secondary | ICD-10-CM | POA: Diagnosis not present

## 2019-04-17 DIAGNOSIS — C50412 Malignant neoplasm of upper-outer quadrant of left female breast: Secondary | ICD-10-CM

## 2019-04-17 DIAGNOSIS — M25612 Stiffness of left shoulder, not elsewhere classified: Secondary | ICD-10-CM

## 2019-04-17 DIAGNOSIS — Z171 Estrogen receptor negative status [ER-]: Secondary | ICD-10-CM | POA: Diagnosis not present

## 2019-04-17 NOTE — Therapy (Signed)
[place], [place], [address] Phone: [phone]   Fax:  [phone]  Physical Therapy Treatment  Patient Details  Name: Kathryn [name] MRN: [id number] Date of Birth: 1960/09/24 Referring Provider (PT): Dr. Jana Hakim, [place]    Encounter Date: 04/17/2019  PT End of Session - 04/17/19 1350    Visit Number  7    Number of Visits  13    Date for PT Re-Evaluation  05/05/19    PT Start Time  1015    PT Stop Time  1055    PT Time Calculation (min)  40 min    Activity Tolerance  Patient tolerated treatment well    Behavior During Therapy  [facility] for tasks assessed/performed       Past Medical History:  Diagnosis Date  . Breast cancer ([place])    stage 3 - left  . Family history of kidney cancer   . Family history of melanoma   . Headache    hormone related, none since 1st child was born   . Personal history of chemotherapy   . Personal history of radiation therapy   . Pleurisy     Past Surgical History:  Procedure Laterality Date  . AXILLARY LYMPH NODE DISSECTION Left 10/11/2018   Procedure: LEFT AXILLARY LYMPH NODE DISSECTION;  Surgeon: Erroll Luna, MD;  Location: [place];  Service: General;  Laterality: Left;  . BREAST LUMPECTOMY Left 08/2018  . BREAST LUMPECTOMY WITH RADIOACTIVE SEED AND SENTINEL LYMPH NODE BIOPSY Left 09/23/2018   Procedure: LEFT BREAST LUMPECTOMY WITH RADIOACTIVE SEED AND LEFT AXILLARY TARGETED LYMPH NODE BIOPY AND  LEFT AXILLARY SENTINEL LYMPH NODE MAPPING;  Surgeon: Erroll Luna, MD;  Location: [place];  Service: General;  Laterality: Left;  . CESAREAN SECTION     x3  . ORIF HUMERUS FRACTURE Right 01/31/2019   Procedure: OPEN REDUCTION INTERNAL FIXATION (ORIF) Right 3 part proximal humerus fracture;  Surgeon: Justice Britain, MD;  [facility];  Service: Orthopedics;  Laterality: Right;  174mn  . PORTACATH PLACEMENT N/A 04/28/2018   Procedure: INSERTION  PORT-A-CATH WITH ULTRASOUND;  Surgeon: CErroll Luna MD;  [facility]  Service: General;  Laterality: N/A;    There were no vitals filed for this visit.  Subjective Assessment - 04/17/19 1014    Subjective  I turned to get my garbage and my back went out.  it was so bad i couldn't even reach up with either arm.  I rested and it went away I am a little sore (around the lat region) so I did not do any exercises    Pertinent History  Neoadjuvant chemotherapy getting through about 50% before having to stop, s/p lumpect[facility] 09[facility]23/18 [facility].  1/1 lymph nodes positive followed by ALND on 10/11/18 with all 7 nodes negative.  Completed radiation  ER/PR/HER2 negative  Pt had fracture of proximal humerus with ORIF on 01/31/2019    Currently in Pain?  Yes    Pain Score  2     Pain Location  Shoulder    Pain Orientation  Right    Pain Descriptors / Indicators  Aching    Pain Type  Surgical pain    Pain Onset  More than a month ago    Pain Frequency  Intermittent         [facility] PT Assessment - 04/17/19 0001      AROM   Right Shoulder Flexion  122 Degrees  pn   Right Shoulder ABduction  83 Degrees   pn   Right Shoulder Internal Rotation  85 Degrees    Right Shoulder External Rotation  68 Degrees   standing                  [facility] Adult PT Treatment/Exercise - 04/17/19 0001      Shoulder Exercises: Supine   Flexion  AAROM;Both;10 reps    Flexion Limitations  with dowel       Shoulder Exercises: Pulleys   Flexion  2 minutes    Scaption  2 minutes      Manual Therapy   Soft tissue mobilization  With thick massage cream to Rt upper arm and upper trap, biceps, and latisimus in flexion    Passive ROM  in supine to the right shoulder to tolerance into flexion, abduction, ER and IR at 45 deg of abduction with vcs for relaxation throughout, pt very limited by tightness at all end motions                  PT Long Term Goals - 04/17/19 1353      PT  LONG TERM GOAL #1   Title  Pt will have 130 degrees of painfree right shoulder flexion so that she will be able to do houshold chores easier    Baseline  78 on eval, 122 on 04/17/19    Status  Partially Met      PT LONG TERM GOAL #2   Title  Pt will have 130 degrees of active painless right shoulder abduction so that she will be able to perform self care    Baseline  55 on eval, 83 on 04/17/19    Status  Partially Met      PT LONG TERM GOAL #3   Title  Pt will be independent in a HEP for right shoulder ROM and strength    Status  On-going      PT LONG TERM GOAL #4   Title  Pt will decrease QDASH to 8%    Status  On-going            Plan - 04/17/19 1350    Clinical Impression Statement  Pt returns after having issues with her back.  Despite having not performed any exercises over this time pt demonstartes improvement in all planes of motion but continues with anterior shoulder pain that is consistent with all movements.  Discussed respecting pain vs working out a stiff shoulder and pt reports we can push her and she will let us know if it is too much in order to get her ROM back.  No trigger points found today improved from last session but helped pt relax and tolerated PROM.    PT Frequency  2x / week    PT Duration  4 weeks    PT Treatment/Interventions  ADLs/Self Care Home Management;Manual lymph drainage;Patient/family education;Therapeutic exercise;Therapeutic activities;Passive range of motion;Scar mobilization;Manual techniques;Neuromuscular re-education;Taping    PT Next Visit Plan  continue Rt shoulder ROM and advance HEP PRN; return to isometrics    Consulted and Agree with Plan of Care  Patient       Patient will benefit from skilled therapeutic intervention in order to improve the following deficits and impairments:     Visit Diagnosis: Abnormal posture  Stiffness of right shoulder, not elsewhere classified  Acute pain of right shoulder  Muscle weakness  (generalized)  Malignant neoplasm of upper-outer quadrant of left breast in  female, estrogen receptor negative ([place])  Stiffness of left shoulder, not elsewhere classified     Problem List Patient Active Problem List   Diagnosis Date Noted  . Port-A-Cath in place 05/17/2018  . Genetic testing 05/10/2018  . Family history of melanoma   . Family history of kidney cancer   . Malignant neoplasm of upper-outer quadrant of left breast in female, estrogen receptor negative ([place]) 04/11/2018  . Chest pain 05/13/2015  . Shortness of breath 05/13/2015    [name] 04/17/2019, 1:56 PM  [place], [place], [address] Phone: [phone]   Fax:  [phone]  Name: YANIAH THIEMANN MRN: [id number] Date of Birth: 25-Nov-1960

## 2019-04-19 ENCOUNTER — Encounter: Payer: BC Managed Care – PPO | Admitting: Rehabilitation

## 2019-04-19 ENCOUNTER — Encounter: Payer: Self-pay | Admitting: *Deleted

## 2019-04-20 ENCOUNTER — Other Ambulatory Visit: Payer: Self-pay

## 2019-04-20 ENCOUNTER — Ambulatory Visit: Payer: BC Managed Care – PPO

## 2019-04-20 DIAGNOSIS — M6281 Muscle weakness (generalized): Secondary | ICD-10-CM

## 2019-04-20 DIAGNOSIS — M25611 Stiffness of right shoulder, not elsewhere classified: Secondary | ICD-10-CM

## 2019-04-20 DIAGNOSIS — R293 Abnormal posture: Secondary | ICD-10-CM | POA: Diagnosis not present

## 2019-04-20 DIAGNOSIS — Z171 Estrogen receptor negative status [ER-]: Secondary | ICD-10-CM | POA: Diagnosis not present

## 2019-04-20 DIAGNOSIS — M25612 Stiffness of left shoulder, not elsewhere classified: Secondary | ICD-10-CM | POA: Diagnosis not present

## 2019-04-20 DIAGNOSIS — M25511 Pain in right shoulder: Secondary | ICD-10-CM

## 2019-04-20 DIAGNOSIS — C50412 Malignant neoplasm of upper-outer quadrant of left female breast: Secondary | ICD-10-CM | POA: Diagnosis not present

## 2019-04-20 NOTE — Therapy (Signed)
[place], [place], [address] Phone: [phone]   Fax:  [phone]  Physical Therapy Treatment  Patient Details  Name: Kathryn [name] MRN: [id number] Date of Birth: [birth year] Referring Provider (PT): Dr. Jana Hakim, [place]    Encounter Date: 04/20/2019  PT End of Session - 04/20/19 0912    Visit Number  8    Number of Visits  13    Date for PT Re-Evaluation  05/05/19    PT Start Time  0906    PT Stop Time  1006    PT Time Calculation (min)  60 min    Activity Tolerance  Patient tolerated treatment well    Behavior During Therapy  [facility] for tasks assessed/performed       Past Medical History:  Diagnosis Date  . Breast cancer ([place])    stage 3 - left  . Family history of kidney cancer   . Family history of melanoma   . Headache    hormone related, none since 1st child was born   . Personal history of chemotherapy   . Personal history of radiation therapy   . Pleurisy     Past Surgical History:  Procedure Laterality Date  . AXILLARY LYMPH NODE DISSECTION Left 10/11/2018   Procedure: LEFT AXILLARY LYMPH NODE DISSECTION;  Surgeon: Erroll Luna, MD;  Location: [place];  Service: General;  Laterality: Left;  . BREAST LUMPECTOMY Left 08/2018  . BREAST LUMPECTOMY WITH RADIOACTIVE SEED AND SENTINEL LYMPH NODE BIOPSY Left 09/23/2018   Procedure: LEFT BREAST LUMPECTOMY WITH RADIOACTIVE SEED AND LEFT AXILLARY TARGETED LYMPH NODE BIOPY AND  LEFT AXILLARY SENTINEL LYMPH NODE MAPPING;  Surgeon: Erroll Luna, MD;  Location: [place];  Service: General;  Laterality: Left;  . CESAREAN SECTION     x3  . ORIF HUMERUS FRACTURE Right 01/31/2019   Procedure: OPEN REDUCTION INTERNAL FIXATION (ORIF) Right 3 part proximal humerus fracture;  Surgeon: Justice Britain, MD;  [facility];  Service: Orthopedics;  Laterality: Right;  128mn  . PORTACATH PLACEMENT N/A 04/28/2018   Procedure: INSERTION  PORT-A-CATH WITH ULTRASOUND;  Surgeon: CErroll Luna MD;  [facility]  Service: General;  Laterality: N/A;    There were no vitals filed for this visit.  Subjective Assessment - 04/20/19 0909    Subjective  I took a Tylenol before I left today so we can stretch a bit more than we've been doing. I'll let you know if it becomes too painful.    Pertinent History  Neoadjuvant chemotherapy getting through about 50% before having to stop, s/p lumpectomy [facility] 09[facility]23/18 [facility].  1/1 lymph nodes positive followed by ALND on 10/11/18 with all 7 nodes negative.  Completed radiation  ER/PR/HER2 negative  Pt had fracture of proximal humerus with ORIF on 01/31/2019    Patient Stated Goals  to get right arm better    Currently in Pain?  Yes    Pain Score  2     Pain Location  Shoulder    Pain Orientation  Right    Pain Descriptors / Indicators  Aching    Pain Type  Surgical pain    Pain Onset  More than a month ago    Pain Frequency  Intermittent    Aggravating Factors   just over stretching    Pain Relieving Factors  HEP exs and tylenol  [facility] Adult PT Treatment/Exercise - 04/20/19 0001      Shoulder Exercises: Pulleys   Flexion  2 minutes    Flexion Limitations  Brief VCs to relax Rt shoulder    Scaption  2 minutes    Scaption Limitations  Tactile cues to decrease Rt scapular compensation and VCs to stop ROM when she can no longer keep shoulder down      Shoulder Exercises: Isometric Strengthening   Flexion  Other (comment)   10x, 5 sec holds in standing pressing against wall   Extension  Other (comment)   10x, 5 sec holds in standing pressing against wall   External Rotation  Other (comment)   10x, 5 sec holds standing in doorway   ABduction  Other (comment)   10x, 5 sec holds standing in doorway     Manual Therapy   Soft tissue mobilization  With thick massage cream to Rt upper arm and upper trap, biceps, and latisimus in flexion before  and during P/ROM to help better promote relaxation.    Passive ROM  in supine to the right shoulder to tolerance into flexion, abduction, er at 45 deg of abduction with vcs for relaxation throughout, pt very limited by tightness at all end motions; also included contract/relax for flexion 3 sets of 2x each except last one 3x due to could tolerate it better towards end                  PT Long Term Goals - 04/17/19 1353      PT LONG TERM GOAL #1   Title  Pt will have 130 degrees of painfree right shoulder flexion so that she will be able to do houshold chores easier    Baseline  78 on eval, 122 on 04/17/19    Status  Partially Met      PT LONG TERM GOAL #2   Title  Pt will have 130 degrees of active painless right shoulder abduction so that she will be able to perform self care    Baseline  55 on eval, 83 on 04/17/19    Status  Partially Met      PT LONG TERM GOAL #3   Title  Pt will be independent in a HEP for right shoulder ROM and strength    Status  On-going      PT LONG TERM GOAL #4   Title  Pt will decrease QDASH to 8%    Status  On-going            Plan - 04/20/19 0913    Clinical Impression Statement  Pts acute back spasm has completey resolved so she was able to tolerate all exercise well today. Resumed isometrics which she did not have any pain or discomfort with. Also continued with AA/ROM which she tolerated well. Added contract/relax with P/ROM today which overall pt tolerated well. She reported some min pain initally with this but this decreased with each rep. Pt did well with more slightly aggressive ROM today.    Personal Factors and Comorbidities  Comorbidity 2    Comorbidities  lymph node removal, radiation    Examination-Activity Limitations  Reach Overhead;Dressing;Hygiene/Grooming    Examination-Participation Restrictions  Yard Work;Cleaning;Laundry;Meal Prep    Stability/Clinical Decision Making  Stable/Uncomplicated    Rehab Potential  Good    PT  Frequency  2x / week    PT Duration  4 weeks    PT Treatment/Interventions  ADLs/Self Care Home Management;Manual lymph drainage;Patient/family education;Therapeutic  exercise;Therapeutic activities;Passive range of motion;Scar mobilization;Manual techniques;Neuromuscular re-education;Taping    PT Next Visit Plan  continue Rt shoulder ROM and advance HEP PRN; cont isometrics and progress to [place] as able    Consulted and Agree with Plan of Care  Patient       Patient will benefit from skilled therapeutic intervention in order to improve the following deficits and impairments:  Decreased mobility, Impaired perceived functional ability, Decreased range of motion, Decreased activity tolerance, Decreased coordination, Decreased knowledge of use of DME, Decreased strength, Increased fascial restricitons, Impaired flexibility, Impaired UE functional use, Postural dysfunction, Pain  Visit Diagnosis: Abnormal posture  Stiffness of right shoulder, not elsewhere classified  Acute pain of right shoulder  Muscle weakness (generalized)     Problem List Patient Active Problem List   Diagnosis Date Noted  . Port-A-Cath in place 05/17/2018  . Genetic testing 05/10/2018  . Family history of melanoma   . Family history of kidney cancer   . Malignant neoplasm of upper-outer quadrant of left breast in female, estrogen receptor negative ([place]) 04/11/2018  . Chest pain 05/13/2015  . Shortness of breath 05/13/2015    Otelia Limes, PTA 04/20/2019, 12:41 PM  [facility] [place], [place], [address] Phone: [phone]   Fax:  [phone]  Name: SANOE HAZAN MRN: [id number] Date of Birth: 05/04/1960

## 2019-04-25 ENCOUNTER — Ambulatory Visit: Payer: BC Managed Care – PPO | Attending: Oncology

## 2019-04-25 ENCOUNTER — Other Ambulatory Visit: Payer: Self-pay

## 2019-04-25 DIAGNOSIS — R293 Abnormal posture: Secondary | ICD-10-CM | POA: Insufficient documentation

## 2019-04-25 DIAGNOSIS — Z171 Estrogen receptor negative status [ER-]: Secondary | ICD-10-CM | POA: Diagnosis not present

## 2019-04-25 DIAGNOSIS — C50412 Malignant neoplasm of upper-outer quadrant of left female breast: Secondary | ICD-10-CM | POA: Diagnosis not present

## 2019-04-25 DIAGNOSIS — M6281 Muscle weakness (generalized): Secondary | ICD-10-CM | POA: Diagnosis not present

## 2019-04-25 DIAGNOSIS — M25611 Stiffness of right shoulder, not elsewhere classified: Secondary | ICD-10-CM | POA: Diagnosis not present

## 2019-04-25 DIAGNOSIS — M25612 Stiffness of left shoulder, not elsewhere classified: Secondary | ICD-10-CM | POA: Diagnosis not present

## 2019-04-25 DIAGNOSIS — M25511 Pain in right shoulder: Secondary | ICD-10-CM | POA: Diagnosis not present

## 2019-04-25 NOTE — Therapy (Signed)
[place], [place], [address] Phone: [phone]   Fax:  [phone]  Physical Therapy Treatment  Patient Details  Name: Kathryn [name] MRN: [id number] Date of Birth: Jul 31, 1960 Referring Provider (PT): Dr. Jana Hakim, [place]    Encounter Date: 04/25/2019  PT End of Session - 04/25/19 1007    Visit Number  9    Number of Visits  13    Date for PT Re-Evaluation  05/05/19    PT Start Time  0902    PT Stop Time  1006    PT Time Calculation (min)  64 min    Activity Tolerance  Patient tolerated treatment well    Behavior During Therapy  [facility] for tasks assessed/performed       Past Medical History:  Diagnosis Date  . Breast cancer ([place])    stage 3 - left  . Family history of kidney cancer   . Family history of melanoma   . Headache    hormone related, none since 1st child was born   . Personal history of chemotherapy   . Personal history of radiation therapy   . Pleurisy     Past Surgical History:  Procedure Laterality Date  . AXILLARY LYMPH NODE DISSECTION Left 10/11/2018   Procedure: LEFT AXILLARY LYMPH NODE DISSECTION;  Surgeon: Erroll Luna, MD;  Location: [facility];  Service: General;  Laterality: Left;  . BREAST LUMPECTOMY Left 08/2018  . BREAST LUMPECTOMY WITH RADIOACTIVE SEED AND SENTINEL LYMPH NODE BIOPSY Left 09/23/2018   Procedure: LEFT BREAST LUMPECTOMY WITH RADIOACTIVE SEED AND LEFT AXILLARY TARGETED LYMPH NODE BIOPY AND  LEFT AXILLARY SENTINEL LYMPH NODE MAPPING;  Surgeon: Erroll Luna, MD;  Location: [facility];  Service: General;  Laterality: Left;  . CESAREAN SECTION     x3  . ORIF HUMERUS FRACTURE Right 01/31/2019   Procedure: OPEN REDUCTION INTERNAL FIXATION (ORIF) Right 3 part proximal humerus fracture;  Surgeon: Justice Britain, MD;  [facility];  Service: Orthopedics;  Laterality: Right;  133mn  . PORTACATH PLACEMENT N/A 04/28/2018   Procedure: INSERTION  PORT-A-CATH WITH ULTRASOUND;  Surgeon: CErroll Luna MD;  [facility]  Service: General;  Laterality: N/A;    There were no vitals filed for this visit.  Subjective Assessment - 04/25/19 0904    Subjective  I worked my arm really good over the weekend, just used it alot helping my husband pack boxes for his work.  Today my arm is just sore, no pain.    Pertinent History  Neoadjuvant chemotherapy getting through about 50% before having to stop, s/[facility]pect[facility].  1/1 lymph nodes positive followed by ALND on 10/11/18 with all 7 nodes negative.  Completed radiation  ER/PR/HER2 negative  Pt had fracture of proximal humerus with ORIF on 01/31/2019    Patient Stated Goals  to get right arm better    Currently in Pain?  No/denies         [facility] - [facility]      AROM   Right Shoulder Flexion  126 Degrees    Right Shoulder ABduction  107 Degrees    Left Shoulder Flexion  144 Degrees    Left Shoulder ABduction  142 Degrees                   [facility] Adult PT Treatment/Exercise - 04/25/19 0001      Shoulder Exercises: Standing   External  Rotation  Strengthening;Right;5 reps;Theraband    Theraband Level (Shoulder External Rotation)  Level 1 (Yellow)    External Rotation Limitations  Was challenging for pt due to limited ROM, so also did "walk away" with pt holding er in neutral and walking away from band 5x    Internal Rotation  Strengthening;Right;10 reps;Theraband    Theraband Level (Shoulder Internal Rotation)  Level 1 (Yellow)    Flexion  Strengthening;Right;5 reps;Theraband    Theraband Level (Shoulder Flexion)  Level 1 (Yellow)    Extension  Strengthening;Right;5 reps;Theraband    Theraband Level (Shoulder Extension)  Level 1 (Yellow)      Shoulder Exercises: Pulleys   Flexion  2 minutes    Flexion Limitations  Brief VCs to relax Rt shoulder    Scaption  2 minutes      Shoulder Exercises: Therapy Ball   Flexion  Both;5  reps   forward lean into end of stretch     Shoulder Exercises: Stretch   Wall Stretch - ABduction  5 reps   3 sec holds up to #20 on finger ladder     Manual Therapy   Manual Therapy  Soft tissue mobilization;Scapular mobilization;Passive ROM    Soft tissue mobilization  With thick massage cream to Rt upper arm and upper trap, biceps, and latisimus in flexion before and during P/ROM to help better promote relaxation; also to medial scapular border for trigger point releae    Scapular Mobilization  In Lt S/L for Rt scapular mobs into retraction and protraction, also depression. Pts scapula very tight but did begin to loosen some towards end and trigger points less palpable    Passive ROM  in supine to the right shoulder to tolerance into flexion, abduction, er at 45 deg of abduction with vcs for relaxation throughout; also included contract/relax for flexion 2 sets of 3x with today only reporting "pinching" at superior shoulder, no pain in upper arm                  PT Long Term Goals - 04/17/19 1353      PT LONG TERM GOAL #1   Title  Pt will have 130 degrees of painfree right shoulder flexion so that she will be able to do houshold chores easier    Baseline  78 on eval, 122 on 04/17/19    Status  Partially Met      PT LONG TERM GOAL #2   Title  Pt will have 130 degrees of active painless right shoulder abduction so that she will be able to perform self care    Baseline  55 on eval, 83 on 04/17/19    Status  Partially Met      PT LONG TERM GOAL #3   Title  Pt will be independent in a HEP for right shoulder ROM and strength    Status  On-going      PT LONG TERM GOAL #4   Title  Pt will decrease QDASH to 8%    Status  On-going            Plan - 04/25/19 1229    Clinical Impression Statement  Continued with manual therapy focusing on more aggressive P/ROM including contract/relax. Also added scapular mobs with pt in Lt S/L as she had multiple trigger points and very  limited scapular mobility. this improved some but still very tight and will benefit from further scapular work. Progressed pt today to [place] though did not add to HEP yet  as want to see how pt tolerates this later, may add at next session. Remeasured her A/ROM at end of session and this has improved marginally with flexion, but well with abduction.    Personal Factors and Comorbidities  Comorbidity 2    Comorbidities  lymph node removal, radiation    Examination-Activity Limitations  Reach Overhead;Dressing;Hygiene/Grooming    Examination-Participation Restrictions  Yard Work;Cleaning;Laundry;Meal Prep    Stability/Clinical Decision Making  Stable/Uncomplicated    Rehab Potential  Good    PT Duration  4 weeks    PT Treatment/Interventions  ADLs/Self Care Home Management;Manual lymph drainage;Patient/family education;Therapeutic exercise;Therapeutic activities;Passive range of motion;Scar mobilization;Manual techniques;Neuromuscular re-education;Taping    PT Next Visit Plan  continue Rt shoulder ROM and advance HEP PRN; cont with [place] and if pt continues to do well with this add to HEP, may also add isomtric walk out for er    PT Home Exercise Plan  Supine dowel exercises and isometrics    Consulted and Agree with Plan of Care  Patient       Patient will benefit from skilled therapeutic intervention in order to improve the following deficits and impairments:  Decreased mobility, Impaired perceived functional ability, Decreased range of motion, Decreased activity tolerance, Decreased coordination, Decreased knowledge of use of DME, Decreased strength, Increased fascial restricitons, Impaired flexibility, Impaired UE functional use, Postural dysfunction, Pain  Visit Diagnosis: Abnormal posture  Stiffness of right shoulder, not elsewhere classified  Acute pain of right shoulder  Muscle weakness (generalized)     Problem List Patient Active Problem List   Diagnosis Date Noted  .  Port-A-Cath in place 05/17/2018  . Genetic testing 05/10/2018  . Family history of melanoma   . Family history of kidney cancer   . Malignant neoplasm of upper-outer quadrant of left breast in female, estrogen receptor negative ([place]) 04/11/2018  . Chest pain 05/13/2015  . Shortness of breath 05/13/2015    Otelia Limes, PTA 04/25/2019, 12:38 PM  [place], [place], [address] Phone: [phone]   Fax:  [phone]  Name: ANNALEI FRIESZ MRN: [id number] Date of Birth: [birth year]
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
## 2019-04-26 ENCOUNTER — Other Ambulatory Visit: Payer: Self-pay

## 2019-04-26 ENCOUNTER — Inpatient Hospital Stay: Payer: BC Managed Care – PPO

## 2019-04-26 ENCOUNTER — Inpatient Hospital Stay (HOSPITAL_BASED_OUTPATIENT_CLINIC_OR_DEPARTMENT_OTHER): Payer: BC Managed Care – PPO | Admitting: Oncology

## 2019-04-26 ENCOUNTER — Inpatient Hospital Stay: Payer: BC Managed Care – PPO | Attending: Oncology

## 2019-04-26 ENCOUNTER — Encounter: Payer: Self-pay | Admitting: Oncology

## 2019-04-26 VITALS — BP 110/74 | HR 104 | Temp 97.8°F | Resp 18 | Ht 64.0 in | Wt 149.7 lb

## 2019-04-26 DIAGNOSIS — Z79899 Other long term (current) drug therapy: Secondary | ICD-10-CM | POA: Insufficient documentation

## 2019-04-26 DIAGNOSIS — C773 Secondary and unspecified malignant neoplasm of axilla and upper limb lymph nodes: Secondary | ICD-10-CM | POA: Insufficient documentation

## 2019-04-26 DIAGNOSIS — Z171 Estrogen receptor negative status [ER-]: Secondary | ICD-10-CM

## 2019-04-26 DIAGNOSIS — C50412 Malignant neoplasm of upper-outer quadrant of left female breast: Secondary | ICD-10-CM

## 2019-04-26 DIAGNOSIS — Z9221 Personal history of antineoplastic chemotherapy: Secondary | ICD-10-CM | POA: Diagnosis not present

## 2019-04-26 DIAGNOSIS — Z8249 Family history of ischemic heart disease and other diseases of the circulatory system: Secondary | ICD-10-CM | POA: Diagnosis not present

## 2019-04-26 DIAGNOSIS — Z95828 Presence of other vascular implants and grafts: Secondary | ICD-10-CM

## 2019-04-26 LAB — CBC WITH DIFFERENTIAL/PLATELET
Abs Immature Granulocytes: 0.01 10*3/uL (ref 0.00–0.07)
Basophils Absolute: 0 10*3/uL (ref 0.0–0.1)
Basophils Relative: 1 %
Eosinophils Absolute: 0.1 10*3/uL (ref 0.0–0.5)
Eosinophils Relative: 2 %
HCT: 33.9 % — ABNORMAL LOW (ref 36.0–46.0)
Hemoglobin: 11.2 g/dL — ABNORMAL LOW (ref 12.0–15.0)
Immature Granulocytes: 0 %
Lymphocytes Relative: 26 %
Lymphs Abs: 0.9 10*3/uL (ref 0.7–4.0)
MCH: 33.9 pg (ref 26.0–34.0)
MCHC: 33 g/dL (ref 30.0–36.0)
MCV: 102.7 fL — ABNORMAL HIGH (ref 80.0–100.0)
Monocytes Absolute: 0.4 10*3/uL (ref 0.1–1.0)
Monocytes Relative: 11 %
Neutro Abs: 2 10*3/uL (ref 1.7–7.7)
Neutrophils Relative %: 60 %
Platelets: 183 10*3/uL (ref 150–400)
RBC: 3.3 MIL/uL — ABNORMAL LOW (ref 3.87–5.11)
RDW: 17.2 % — ABNORMAL HIGH (ref 11.5–15.5)
WBC: 3.4 10*3/uL — ABNORMAL LOW (ref 4.0–10.5)
nRBC: 0 % (ref 0.0–0.2)

## 2019-04-26 LAB — COMPREHENSIVE METABOLIC PANEL
ALT: 25 U/L (ref 0–44)
AST: 19 U/L (ref 15–41)
Albumin: 3.8 g/dL (ref 3.5–5.0)
Alkaline Phosphatase: 108 U/L (ref 38–126)
Anion gap: 7 (ref 5–15)
BUN: 12 mg/dL (ref 6–20)
CO2: 25 mmol/L (ref 22–32)
Calcium: 8.7 mg/dL — ABNORMAL LOW (ref 8.9–10.3)
Chloride: 107 mmol/L (ref 98–111)
Creatinine, Ser: 0.69 mg/dL (ref 0.44–1.00)
GFR calc Af Amer: >60 mL/min (ref >60)
GFR calc non Af Amer: >60 mL/min (ref >60)
Glucose, Bld: 119 mg/dL — ABNORMAL HIGH (ref 70–99)
Potassium: 4 mmol/L (ref 3.5–5.1)
Sodium: 139 mmol/L (ref 135–145)
Total Bilirubin: 0.6 mg/dL (ref 0.3–1.2)
Total Protein: 6.4 g/dL — ABNORMAL LOW (ref 6.5–8.1)

## 2019-04-26 MED ORDER — SODIUM CHLORIDE 0.9% FLUSH
10.0000 mL | Freq: Once | INTRAVENOUS | Status: AC
  Administered 2019-04-26: 10 mL
  Filled 2019-04-26: qty 10

## 2019-04-26 MED ORDER — HEPARIN SOD (PORK) LOCK FLUSH 100 UNIT/ML IV SOLN
500.0000 [IU] | Freq: Once | INTRAVENOUS | Status: AC
  Administered 2019-04-26: 10:00:00 500 [IU]
  Filled 2019-04-26: qty 5

## 2019-04-27 ENCOUNTER — Encounter: Payer: Self-pay | Admitting: Physical Therapy

## 2019-04-27 ENCOUNTER — Ambulatory Visit: Payer: BC Managed Care – PPO | Admitting: Physical Therapy

## 2019-04-27 ENCOUNTER — Telehealth: Payer: Self-pay | Admitting: Oncology

## 2019-04-27 DIAGNOSIS — Z171 Estrogen receptor negative status [ER-]: Secondary | ICD-10-CM | POA: Diagnosis not present

## 2019-04-27 DIAGNOSIS — M25612 Stiffness of left shoulder, not elsewhere classified: Secondary | ICD-10-CM | POA: Diagnosis not present

## 2019-04-27 DIAGNOSIS — M25611 Stiffness of right shoulder, not elsewhere classified: Secondary | ICD-10-CM | POA: Diagnosis not present

## 2019-04-27 DIAGNOSIS — R293 Abnormal posture: Secondary | ICD-10-CM

## 2019-04-27 DIAGNOSIS — C50412 Malignant neoplasm of upper-outer quadrant of left female breast: Secondary | ICD-10-CM | POA: Diagnosis not present

## 2019-04-27 DIAGNOSIS — M25511 Pain in right shoulder: Secondary | ICD-10-CM | POA: Diagnosis not present

## 2019-04-27 DIAGNOSIS — M6281 Muscle weakness (generalized): Secondary | ICD-10-CM | POA: Diagnosis not present

## 2019-04-27 NOTE — Therapy (Signed)
[place], [place], [address] Phone: [phone]   Fax:  [phone]  Physical Therapy Treatment  Patient Details  Name: Kathryn [name] MRN: [id number] Date of Birth: 05-02-1960 Referring Provider (PT): Dr. Jana Hakim, Supple   Progress Note Reporting Period 03/21/2019  to 04/27/2019   See note below for Objective Data and Assessment of Progress/Goals.      Encounter Date: 04/27/2019  PT End of Session - 04/27/19 1703    Visit Number  10    Number of Visits  13    Date for PT Re-Evaluation  05/05/19    PT Start Time  1600    PT Stop Time  1640    PT Time Calculation (min)  40 min    Activity Tolerance  Patient tolerated treatment well    Behavior During Therapy  [facility] for tasks assessed/performed       Past Medical History:  Diagnosis Date  . Breast cancer ([place])    stage 3 - left  . Family history of kidney cancer   . Family history of melanoma   . Headache    hormone related, none since 1st child was born   . Personal history of chemotherapy   . Personal history of radiation therapy   . Pleurisy     Past Surgical History:  Procedure Laterality Date  . AXILLARY LYMPH NODE DISSECTION Left 10/11/2018   Procedure: LEFT AXILLARY LYMPH NODE DISSECTION;  Surgeon: Erroll Luna, MD;  Location: [facility];  Service: General;  Laterality: Left;  . BREAST LUMPECTOMY Left 08/2018  . BREAST LUMPECTOMY WITH RADIOACTIVE SEED AND SENTINEL LYMPH NODE BIOPSY Left 09/23/2018   Procedure: LEFT BREAST LUMPECTOMY WITH RADIOACTIVE SEED AND LEFT AXILLARY TARGETED LYMPH NODE BIOPY AND  LEFT AXILLARY SENTINEL LYMPH NODE MAPPING;  Surgeon: Erroll Luna, MD;  Location: [facility];  Service: General;  Laterality: Left;  . CESAREAN SECTION     x3  . ORIF HUMERUS FRACTURE Right 01/31/2019   Procedure: OPEN REDUCTION INTERNAL FIXATION (ORIF) Right 3 part proximal humerus fracture;  Surgeon: Justice Britain, MD;   [facility];  Service: Orthopedics;  Laterality: Right;  151mn  . PORTACATH PLACEMENT N/A 04/28/2018   Procedure: INSERTION PORT-A-CATH WITH ULTRASOUND;  Surgeon: CErroll Luna MD;  [facility]  Service: General;  Laterality: N/A;    There were no vitals filed for this visit.  Subjective Assessment - 04/27/19 1604    Subjective  "Its still no unfrozen"  She  still has pain in the front of the arm she is trying ot use her arm    Pertinent History  Neoadjuvant chemotherapy getting through about 50% before having to stop, s/p lumpectomy on [facility]/23/[facility].  1/1 lymph nodes positive followed by ALND on 10/11/18 with all 7 nodes negative.  Completed radiation  ER/PR/HER2 negative  Pt had fracture of proximal humerus with ORIF on 01/31/2019    Currently in Pain?  Yes    Pain Score  2     Pain Location  Arm    Pain Orientation  Left    Pain Descriptors / Indicators  Aching    Pain Type  Chronic pain    Pain Onset  More than a month ago                       [facility]/Exercise - 04/27/19 0001      Exercises  Exercises  Knee/Hip      Elbow Exercises   Elbow Flexion  AROM   pt standing on airex with cues for core activtion    Bar Weights/Barbell (Elbow Flexion)  3 lbs      Knee/Hip Exercises: Standing   Other Standing Knee Exercises  backward step up onto 6 inch step to strengthen back body       Shoulder Exercises: Standing   External Rotation  Strengthening    Theraband Level (Shoulder External Rotation)  Level 1 (Yellow)    External Rotation Limitations  pt not able to get full range with this     Internal Rotation  Strengthening;Right;10 reps;Theraband    Theraband Level (Shoulder Internal Rotation)  Level 1 (Yellow)    Flexion  Strengthening;Right;5 reps;Theraband    Theraband Level (Shoulder Flexion)  Level 1 (Yellow)    Extension  Strengthening;Right;5 reps;Theraband    Theraband Level (Shoulder Extension)  Level 1 (Yellow)     Row  Strengthening;Right;Left;20 reps    Theraband Level (Shoulder Row)  Level 2 (Red)    Row Limitations  10 for bilateral and 10 for alternating     Other Standing Exercises  external rotation "walk outs" with yellow theraband with folded towel under upper arm       Shoulder Exercises: Pulleys   Flexion  2 minutes    Scaption  2 minutes      Shoulder Exercises: Therapy Ball   Flexion  Both;5 reps    Flexion Limitations  also did this with both forearms on foam roller       Shoulder Exercises: ROM/Strengthening   Other ROM/Strengthening Exercises  for UE ranger for circumduction       Shoulder Exercises: Isometric Strengthening   Other Isometric Exercises  closed chain with hand pushed onto ball and small circles in each direction              PT Education - 04/27/19 1702    Education Details  reviewed [place] and added walkouts    Person(s) Educated  Patient    Methods  Explanation;Demonstration;Handout    Comprehension  Verbalized understanding;Returned demonstration          PT Long Term Goals - 04/17/19 1353      PT LONG TERM GOAL #1   Title  Pt will have 130 degrees of painfree right shoulder flexion so that she will be able to do houshold chores easier    Baseline  78 on eval, 122 on 04/17/19    Status  Partially Met      PT LONG TERM GOAL #2   Title  Pt will have 130 degrees of active painless right shoulder abduction so that she will be able to perform self care    Baseline  55 on eval, 83 on 04/17/19    Status  Partially Met      PT LONG TERM GOAL #3   Title  Pt will be independent in a HEP for right shoulder ROM and strength    Status  On-going      PT LONG TERM GOAL #4   Title  Pt will decrease QDASH to 8%    Status  On-going            Plan - 04/27/19 1704    Clinical Impression Statement  Pt is improving in strength and was able to do more exercises with resistance today with no increase in pain. Upgraded home program    Personal  Factors and  Comorbidities  Comorbidity 2    Comorbidities  lymph node removal, radiation    Examination-Activity Limitations  Reach Overhead;Dressing;Hygiene/Grooming    Examination-Participation Restrictions  Yard Work;Cleaning;Laundry;Meal Prep    Stability/Clinical Decision Making  Stable/Uncomplicated    Rehab Potential  Good    PT Frequency  2x / week    PT Duration  4 weeks    PT Treatment/Interventions  ADLs/Self Care Home Management;Manual lymph drainage;Patient/family education;Therapeutic exercise;Therapeutic activities;Passive range of motion;Scar mobilization;Manual techniques;Neuromuscular re-education;Taping    PT Next Visit Plan  continue Rt shoulder ROM and advance HEP PRN; cont with [place] and if pt continues to do well with this add to HEP, may also add isomtric walk out for er    PT Home Exercise Plan  Supine dowel exercises and isometrics, rockwoods    Consulted and Agree with Plan of Care  Patient       Patient will benefit from skilled therapeutic intervention in order to improve the following deficits and impairments:     Visit Diagnosis: Abnormal posture  Stiffness of right shoulder, not elsewhere classified  Acute pain of right shoulder  Muscle weakness (generalized)  Stiffness of left shoulder, not elsewhere classified     Problem List Patient Active Problem List   Diagnosis Date Noted  . Port-A-Cath in place 05/17/2018  . Genetic testing 05/10/2018  . Family history of melanoma   . Family history of kidney cancer   . Malignant neoplasm of upper-outer quadrant of left breast in female, estrogen receptor negative ([place]) 04/11/2018  . Chest pain 05/13/2015  . Shortness of breath 05/13/2015   Donato Heinz. Owens Shark PT  Norwood Levo 04/27/2019, 5:06 PM  [place], [place], [address] Phone: [phone]   Fax:  [phone]  Name: JENNIPHER WEATHERHOLTZ MRN: [id number] Date  of Birth: 07-02-1960

## 2019-04-27 NOTE — Patient Instructions (Addendum)
Strengthening: Resisted Flexion   Cancer Rehab [phone]    Hold tubing with right arm at side. Pull forward and up. Move shoulder through pain-free range of motion. Repeat __5-10__ times per set. Do _1-2___ sessions per day.  Strengthening: Resisted Internal Rotation    Hold tubing in right hand, elbow at side and forearm out. Rotate forearm in across body. Repeat __5-10__ times per set. Do _1-2___ sessions per day.  Strengthening: Resisted Extension    Hold tubing in right hand, arm forward. Pull arm back, elbow straight. Repeat _5-10___ times per set. Do _1-2___ sessions per day.  Strengthening: Resisted External Rotation    Hold tubing in right hand, elbow at side and forearm across body. Rotate forearm out. Repeat _5-10___ times per set. Do __1-2__ sessions per day.   ------------------------------------- Walk outs:     Have folded towel under arm with elbow bent at waist. Hold band in right hand with left side closet to door.  Keep arm in perpendicular position and walk out 2 steps and return.  Do no let band pull your arm across your body

## 2019-04-27 NOTE — Telephone Encounter (Signed)
I talk with patient regarding schedule  

## 2019-05-01 ENCOUNTER — Ambulatory Visit: Payer: BC Managed Care – PPO | Admitting: Rehabilitation

## 2019-05-01 ENCOUNTER — Encounter: Payer: Self-pay | Admitting: Rehabilitation

## 2019-05-01 ENCOUNTER — Other Ambulatory Visit: Payer: Self-pay

## 2019-05-01 DIAGNOSIS — R293 Abnormal posture: Secondary | ICD-10-CM | POA: Diagnosis not present

## 2019-05-01 DIAGNOSIS — M6281 Muscle weakness (generalized): Secondary | ICD-10-CM

## 2019-05-01 DIAGNOSIS — M25612 Stiffness of left shoulder, not elsewhere classified: Secondary | ICD-10-CM | POA: Diagnosis not present

## 2019-05-01 DIAGNOSIS — M25511 Pain in right shoulder: Secondary | ICD-10-CM

## 2019-05-01 DIAGNOSIS — Z171 Estrogen receptor negative status [ER-]: Secondary | ICD-10-CM | POA: Diagnosis not present

## 2019-05-01 DIAGNOSIS — M25611 Stiffness of right shoulder, not elsewhere classified: Secondary | ICD-10-CM | POA: Diagnosis not present

## 2019-05-01 DIAGNOSIS — C50412 Malignant neoplasm of upper-outer quadrant of left female breast: Secondary | ICD-10-CM | POA: Diagnosis not present

## 2019-05-01 NOTE — Therapy (Signed)
[place], [place], [address] Phone: [phone]   Fax:  [phone]  Physical Therapy Treatment  Patient Details  Name: Kathryn [name] MRN: [id number] Date of Birth: 1960-06-02 Referring Provider (PT): Dr. Jana Hakim, [place]    Encounter Date: 05/01/2019  PT End of Session - 05/01/19 1155    Visit Number  11    Number of Visits  17    Date for PT Re-Evaluation  05/29/19    PT Start Time  1100    PT Stop Time  1146    PT Time Calculation (min)  46 min    Activity Tolerance  Patient tolerated treatment well    Behavior During Therapy  [facility] for tasks assessed/performed       Past Medical History:  Diagnosis Date  . Breast cancer ([place])    stage 3 - left  . Family history of kidney cancer   . Family history of melanoma   . Headache    hormone related, none since 1st child was born   . Personal history of chemotherapy   . Personal history of radiation therapy   . Pleurisy     Past Surgical History:  Procedure Laterality Date  . AXILLARY LYMPH NODE DISSECTION Left 10/11/2018   Procedure: LEFT AXILLARY LYMPH NODE DISSECTION;  Surgeon: Erroll Luna, MD;  Location: [facility];  Service: General;  Laterality: Left;  . BREAST LUMPECTOMY Left 08/2018  . BREAST LUMPECTOMY WITH RADIOACTIVE SEED AND SENTINEL LYMPH NODE BIOPSY Left 09/23/2018   Procedure: LEFT BREAST LUMPECTOMY WITH RADIOACTIVE SEED AND LEFT AXILLARY TARGETED LYMPH NODE BIOPY AND  LEFT AXILLARY SENTINEL LYMPH NODE MAPPING;  Surgeon: Erroll Luna, MD;  Location: [facility];  Service: General;  Laterality: Left;  . CESAREAN SECTION     x3  . ORIF HUMERUS FRACTURE Right 01/31/2019   Procedure: OPEN REDUCTION INTERNAL FIXATION (ORIF) Right 3 part proximal humerus fracture;  Surgeon: Justice Britain, MD;  [facility];  Service: Orthopedics;  Laterality: Right;  174mn  . PORTACATH PLACEMENT N/A 04/28/2018   Procedure: INSERTION  PORT-A-CATH WITH ULTRASOUND;  Surgeon: CErroll Luna MD;  [facility]  Service: General;  Laterality: N/A;    There were no vitals filed for this visit.  Subjective Assessment - 05/01/19 1104    Subjective  it is just sore today no reason    Pertinent History  Neoadjuvant chemotherapy getting through about 50% before having to stop, s/p lumpectomy on 09/23/18 [facility].  1/1 lymph nodes positive followed by ALND on 10/11/18 with all 7 nodes negative.  Completed radiation  ER/PR/HER2 negative  Pt had fracture of proximal humerus with ORIF on 01/31/2019    Currently in Pain?  Yes    Pain Score  4     Pain Location  Shoulder   in to the supraspinatus                      [facility] Adult PT Treatment/Exercise - 05/01/19 0001      Shoulder Exercises: Standing   Protraction  Both    Protraction Limitations  pro/ret on wall x 10     Horizontal ABduction Limitations  needing manual cues at the scapula for improved performance    Flexion  Strengthening;Right;Theraband;10 reps    Theraband Level (Shoulder Flexion)  Level 1 (Yellow)    Extension  Strengthening;Right;Theraband;10 reps    Theraband Level (Shoulder Extension)  Level 1 (Yellow)  Extension Limitations  cueing to keep trunk rotation minimal    Row  Strengthening;10 reps;Theraband;Both    Theraband Level (Shoulder Row)  Level 2 (Red)    Other Standing Exercises  external rotation "walk outs" with yellow theraband with folded towel under upper arm       Shoulder Exercises: Pulleys   Flexion  2 minutes    Flexion Limitations  for active warm up    Scaption  2 minutes    Scaption Limitations  for active warm up      Shoulder Exercises: Therapy Ball   Flexion  Both;10 reps    Flexion Limitations  with 5 second forward lean       Manual Therapy   Passive ROM  in supine to the right shoulder to tolerance into flexion, abduction, er at 45 deg of abduction with vcs for relaxation throughout; more work into  abd/flex/ER                  PT Long Term Goals - 04/17/19 1353      PT LONG TERM GOAL #1   Title  Pt will have 130 degrees of painfree right shoulder flexion so that she will be able to do houshold chores easier    Baseline  78 on eval, 122 on 04/17/19    Status  Partially Met      PT LONG TERM GOAL #2   Title  Pt will have 130 degrees of active painless right shoulder abduction so that she will be able to perform self care    Baseline  55 on eval, 83 on 04/17/19    Status  Partially Met      PT LONG TERM GOAL #3   Title  Pt will be independent in a HEP for right shoulder ROM and strength    Status  On-going      PT LONG TERM GOAL #4   Title  Pt will decrease QDASH to 8%    Status  On-going            Plan - 05/01/19 1156    Clinical Impression Statement  pt with more ability to separate [place] movement from scapular movement since this PT saw pt last.  continued with more exercise today with pt tolerates well.    PT Frequency  2x / week    PT Duration  4 weeks    PT Treatment/Interventions  ADLs/Self Care Home Management;Manual lymph drainage;Patient/family education;Therapeutic exercise;Therapeutic activities;Passive range of motion;Scar mobilization;Manual techniques;Neuromuscular re-education;Taping    PT Next Visit Plan  continue Rt shoulder ROM and advance HEP PRN;    Consulted and Agree with Plan of Care  Patient       Patient will benefit from skilled therapeutic intervention in order to improve the following deficits and impairments:     Visit Diagnosis: Abnormal posture  Stiffness of right shoulder, not elsewhere classified  Acute pain of right shoulder  Muscle weakness (generalized)  Stiffness of left shoulder, not elsewhere classified  Malignant neoplasm of upper-outer quadrant of left breast in female, estrogen receptor negative ([employer])     Problem List Patient Active Problem List   Diagnosis Date Noted  . Port-A-Cath in place 05/17/2018   . Genetic testing 05/10/2018  . Family history of melanoma   . Family history of kidney cancer   . Malignant neoplasm of upper-outer quadrant of left breast in female, estrogen receptor negative ([place]) 04/11/2018  . Chest pain 05/13/2015  . Shortness of breath 05/13/2015  [name] 05/01/2019, 11:59 AM  [place], [place], [address] Phone: [phone]   Fax:  [phone]  Name: Kathryn [name] MRN: [id number] Date of Birth: 1960-02-26

## 2019-05-03 ENCOUNTER — Ambulatory Visit: Payer: BC Managed Care – PPO

## 2019-05-03 ENCOUNTER — Other Ambulatory Visit: Payer: Self-pay

## 2019-05-03 DIAGNOSIS — R293 Abnormal posture: Secondary | ICD-10-CM | POA: Diagnosis not present

## 2019-05-03 DIAGNOSIS — C50412 Malignant neoplasm of upper-outer quadrant of left female breast: Secondary | ICD-10-CM | POA: Diagnosis not present

## 2019-05-03 DIAGNOSIS — M6281 Muscle weakness (generalized): Secondary | ICD-10-CM | POA: Diagnosis not present

## 2019-05-03 DIAGNOSIS — M25611 Stiffness of right shoulder, not elsewhere classified: Secondary | ICD-10-CM | POA: Diagnosis not present

## 2019-05-03 DIAGNOSIS — Z171 Estrogen receptor negative status [ER-]: Secondary | ICD-10-CM | POA: Diagnosis not present

## 2019-05-03 DIAGNOSIS — S42201D Unspecified fracture of upper end of right humerus, subsequent encounter for fracture with routine healing: Secondary | ICD-10-CM | POA: Diagnosis not present

## 2019-05-03 DIAGNOSIS — M25612 Stiffness of left shoulder, not elsewhere classified: Secondary | ICD-10-CM | POA: Diagnosis not present

## 2019-05-03 DIAGNOSIS — M25511 Pain in right shoulder: Secondary | ICD-10-CM

## 2019-05-03 NOTE — Therapy (Signed)
[place], [place], [address] Phone: [phone]   Fax:  [phone]  Physical Therapy Treatment  Patient Details  Name: Kathryn [name] MRN: [id number] Date of Birth: 1960-07-17 Referring Provider (PT): Dr. Jana Hakim, [place]    Encounter Date: 05/03/2019  PT End of Session - 05/03/19 1103    Visit Number  12    Number of Visits  17    Date for PT Re-Evaluation  05/29/19    PT Start Time  1008    PT Stop Time  1103    PT Time Calculation (min)  55 min    Activity Tolerance  Patient tolerated treatment well    Behavior During Therapy  [facility] for tasks assessed/performed       Past Medical History:  Diagnosis Date  . Breast cancer ([place])    stage 3 - left  . Family history of kidney cancer   . Family history of melanoma   . Headache    hormone related, none since 1st child was born   . Personal history of chemotherapy   . Personal history of radiation therapy   . Pleurisy     Past Surgical History:  Procedure Laterality Date  . AXILLARY LYMPH NODE DISSECTION Left 10/11/2018   Procedure: LEFT AXILLARY LYMPH NODE DISSECTION;  Surgeon: Erroll Luna, MD;  Location: [facility];  Service: General;  Laterality: Left;  . BREAST LUMPECTOMY Left 08/2018  . BREAST LUMPECTOMY WITH RADIOACTIVE SEED AND SENTINEL LYMPH NODE BIOPSY Left 09/23/2018   Procedure: LEFT BREAST LUMPECTOMY WITH RADIOACTIVE SEED AND LEFT AXILLARY TARGETED LYMPH NODE BIOPY AND  LEFT AXILLARY SENTINEL LYMPH NODE MAPPING;  Surgeon: Erroll Luna, MD;  Location: [place];  Service: General;  Laterality: Left;  . CESAREAN SECTION     x3  . ORIF HUMERUS FRACTURE Right 01/31/2019   Procedure: OPEN REDUCTION INTERNAL FIXATION (ORIF) Right 3 part proximal humerus fracture;  Surgeon: Justice Britain, MD;  [facility];  Service: Orthopedics;  Laterality: Right;  170mn  . PORTACATH PLACEMENT N/A 04/28/2018   Procedure: INSERTION  PORT-A-CATH WITH ULTRASOUND;  Surgeon: CErroll Luna MD;  [facility]  Service: General;  Laterality: N/A;    There were no vitals filed for this visit.  Subjective Assessment - 05/03/19 1013    Subjective  I had some pain at my Rt scapula yesterday like when I took a deep breath, I think it was a trigger point. It's just sore there today. I see Dr. SOnnie Grahamtoday for a check up to see how my shoulder is doing.    Pertinent History  Neoadjuvant chemotherapy getting through about 50% before having to stop, s/p lumpectomy on 09/23/18 [facility].  1/1 lymph nodes positive followed by ALND on 10/11/18 with all 7 nodes negative.  Completed radiation  ER/PR/HER2 negative  Pt had fracture of proximal humerus with ORIF on 01/31/2019    Patient Stated Goals  to get right arm better    Currently in Pain?  Yes    Pain Score  3     Pain Location  Shoulder    Pain Orientation  Right    Pain Descriptors / Indicators  Aching    Pain Type  Chronic pain    Pain Onset  More than a month ago    Pain Frequency  Intermittent    Aggravating Factors   I think we just pushed it a little Monday    Pain  Relieving Factors  I just know it's typical soreness from exercising, it went away         [facility] PT Assessment - 05/03/19 0001      AROM   Right Shoulder Flexion  130 Degrees    Right Shoulder ABduction  111 Degrees                   [facility] Adult PT Treatment/Exercise - 05/03/19 0001      Shoulder Exercises: Supine   Other Supine Exercises  Supine on towel roll along thoracic spine for bil horz abd x10, then "V" x10. Next serratus punches with 2lbs, 2x10 returning therapist demo      Manual Therapy   Passive ROM  in supine to the right shoulder to tolerance into flexion, abduction, er at 45 deg of abduction with vcs for relaxation throughout; more work into abd/flex/ER                  PT Long Term Goals - 04/17/19 [place] #1   Title  Pt will have  130 degrees of painfree right shoulder flexion so that she will be able to do houshold chores easier    Baseline  78 on eval, 122 on 04/17/19    Status  Partially Met      PT LONG TERM GOAL #2   Title  Pt will have 130 degrees of active painless right shoulder abduction so that she will be able to perform self care    Baseline  55 on eval, 83 on 04/17/19    Status  Partially Met      PT LONG TERM GOAL #3   Title  Pt will be independent in a HEP for right shoulder ROM and strength    Status  On-going      PT LONG TERM GOAL #4   Title  Pt will decrease QDASH to 8%    Status  On-going            Plan - 05/03/19 1227    Clinical Impression Statement  Added A/ROM exercises in supine and serratus punches today. Pt tolerated all well just reporting starting to feel soreness in upper arm "at usual spot". Continued with P/ROM and soft tissue working to increase her end ROM. Pt sees Dr. Onnie Graham today for check up of Rt shoulder and how she's recovering.    Personal Factors and Comorbidities  Comorbidity 2    Comorbidities  lymph node removal, radiation    Examination-Activity Limitations  Reach Overhead;Dressing;Hygiene/Grooming    Examination-Participation Restrictions  Yard Work;Cleaning;Laundry;Meal Prep    Stability/Clinical Decision Making  Stable/Uncomplicated    Rehab Potential  Good    PT Frequency  2x / week    PT Duration  4 weeks    PT Treatment/Interventions  ADLs/Self Care Home Management;Manual lymph drainage;Patient/family education;Therapeutic exercise;Therapeutic activities;Passive range of motion;Scar mobilization;Manual techniques;Neuromuscular re-education;Taping    PT Next Visit Plan  continue Rt shoulder ROM and advance HEP PRN; see how visit with Dr. Onnie Graham went    PT Home Exercise Plan  Supine dowel exercises and isometrics, [place]    Consulted and Agree with Plan of Care  Patient       Patient will benefit from skilled therapeutic intervention in order to  improve the following deficits and impairments:  Decreased mobility, Impaired perceived functional ability, Decreased range of motion, Decreased activity tolerance, Decreased coordination, Decreased knowledge of use of DME, Decreased  strength, Increased fascial restricitons, Impaired flexibility, Impaired UE functional use, Postural dysfunction, Pain  Visit Diagnosis: Abnormal posture  Stiffness of right shoulder, not elsewhere classified  Acute pain of right shoulder  Muscle weakness (generalized)     Problem List Patient Active Problem List   Diagnosis Date Noted  . Port-A-Cath in place 05/17/2018  . Genetic testing 05/10/2018  . Family history of melanoma   . Family history of kidney cancer   . Malignant neoplasm of upper-outer quadrant of left breast in female, estrogen receptor negative ([place]) 04/11/2018  . Chest pain 05/13/2015  . Shortness of breath 05/13/2015    Otelia Limes, PTA 05/03/2019, 12:32 PM  [place], [place], [address] Phone: [phone]   Fax:  [phone]  Name: Kathryn [name] MRN: [id number] Date of Birth: Apr 11, 1960

## 2019-05-08 ENCOUNTER — Ambulatory Visit: Payer: BC Managed Care – PPO

## 2019-05-08 ENCOUNTER — Other Ambulatory Visit: Payer: Self-pay

## 2019-05-08 DIAGNOSIS — M25611 Stiffness of right shoulder, not elsewhere classified: Secondary | ICD-10-CM

## 2019-05-08 DIAGNOSIS — M25612 Stiffness of left shoulder, not elsewhere classified: Secondary | ICD-10-CM | POA: Diagnosis not present

## 2019-05-08 DIAGNOSIS — M25511 Pain in right shoulder: Secondary | ICD-10-CM | POA: Diagnosis not present

## 2019-05-08 DIAGNOSIS — M6281 Muscle weakness (generalized): Secondary | ICD-10-CM | POA: Diagnosis not present

## 2019-05-08 DIAGNOSIS — R293 Abnormal posture: Secondary | ICD-10-CM | POA: Diagnosis not present

## 2019-05-08 DIAGNOSIS — C50412 Malignant neoplasm of upper-outer quadrant of left female breast: Secondary | ICD-10-CM | POA: Diagnosis not present

## 2019-05-08 DIAGNOSIS — Z171 Estrogen receptor negative status [ER-]: Secondary | ICD-10-CM | POA: Diagnosis not present

## 2019-05-08 NOTE — Therapy (Signed)
[place], [place], [address] Phone: [phone]   Fax:  [phone]  Physical Therapy Treatment  Patient Details  Name: Kathryn [name] MRN: [id number] Date of Birth: 1960-03-11 Referring Provider (PT): Dr. Jana Hakim, [place]    Encounter Date: 05/08/2019  PT End of Session - 05/08/19 1400    Visit Number  13    Number of Visits  17    Date for PT Re-Evaluation  05/29/19    PT Start Time  1304    PT Stop Time  1400    PT Time Calculation (min)  56 min    Activity Tolerance  Patient tolerated treatment well    Behavior During Therapy  [name] for tasks assessed/performed       Past Medical History:  Diagnosis Date  . Breast cancer ([place])    stage 3 - left  . Family history of kidney cancer   . Family history of melanoma   . Headache    hormone related, none since 1st child was born   . Personal history of chemotherapy   . Personal history of radiation therapy   . Pleurisy     Past Surgical History:  Procedure Laterality Date  . AXILLARY LYMPH NODE DISSECTION Left 10/11/2018   Procedure: LEFT AXILLARY LYMPH NODE DISSECTION;  Surgeon: Erroll Luna, MD;  Location: [facility];  Service: General;  Laterality: Left;  . BREAST LUMPECTOMY Left 08/2018  . BREAST LUMPECTOMY WITH RADIOACTIVE SEED AND SENTINEL LYMPH NODE BIOPSY Left 09/23/2018   Procedure: LEFT BREAST LUMPECTOMY WITH RADIOACTIVE SEED AND LEFT AXILLARY TARGETED LYMPH NODE BIOPY AND  LEFT AXILLARY SENTINEL LYMPH NODE MAPPING;  Surgeon: Erroll Luna, MD;  Location: [place];  Service: General;  Laterality: Left;  . CESAREAN SECTION     x3  . ORIF HUMERUS FRACTURE Right 01/31/2019   Procedure: OPEN REDUCTION INTERNAL FIXATION (ORIF) Right 3 part proximal humerus fracture;  Surgeon: Justice Britain, MD;  [facility];  Service: Orthopedics;  Laterality: Right;  112mn  . PORTACATH PLACEMENT N/A 04/28/2018   Procedure: INSERTION  PORT-A-CATH WITH ULTRASOUND;  Surgeon: CErroll Luna MD;  Location[facility]  Service: General;  Laterality: N/A;    There were no vitals filed for this visit.  Subjective Assessment - 05/08/19 1308    Subjective  [name] and he is very pleased with my progress thus far. He wants me to continue PT for at least another month and says we can be aggressive to my tolerance with stretching. I was vaccuuming at my moms yesterday and felt a pop with dull pain after in my Rt upper arm but it feels fine today.    Pertinent History  Neoadjuvant chemotherapy getting through about 50% before having to stop, s/p lumpectomy on 09[facility]23/18 [facility].  1/1 lymph nodes positive followed by ALND on 10/11/18 with all 7 nodes negative.  Completed radiation  ER/PR/HER2 negative  Pt had fracture of proximal humerus with ORIF on 01/31/2019    Patient Stated Goals  to get right arm better    Currently in Pain?  No/denies                       [facility] Adult PT Treatment/Exercise - 05/08/19 0001      Shoulder Exercises: Supine   Other Supine Exercises  Supine on towel roll along thoracic spine for bil horz abd x10, 5 sec holds,  then "V" x10. Next serratus punches with 2lbs, 2x10 returning therapist demo      Shoulder Exercises: Pulleys   Flexion  2 minutes    Flexion Limitations  VCs to remind pt to decrease Rt scapular compensation    Scaption  2 minutes      Shoulder Exercises: Therapy Ball   Flexion  Both;10 reps    Flexion Limitations  With VCs to remind pt of correct technique for most effective stretch      Manual Therapy   Passive ROM  in supine to the right shoulder to tolerance into flexion, abduction, er at 45 deg of abduction with vcs for relaxation throughout                  PT Long Term Goals - 04/17/19 1353      PT LONG TERM GOAL #1   Title  Pt will have 130 degrees of painfree right shoulder flexion so that she will be able to do houshold  chores easier    Baseline  78 on eval, 122 on 04/17/19    Status  Partially Met      PT LONG TERM GOAL #2   Title  Pt will have 130 degrees of active painless right shoulder abduction so that she will be able to perform self care    Baseline  55 on eval, 83 on 04/17/19    Status  Partially Met      PT LONG TERM GOAL #3   Title  Pt will be independent in a HEP for right shoulder ROM and strength    Status  On-going      PT LONG TERM GOAL #4   Title  Pt will decrease QDASH to 8%    Status  On-going            Plan - 05/08/19 1400    Clinical Impression Statement  Continued with manual therapy focusing on end P/ROM to pts tolerance. Also continued with AA/ROM encouraging pt to push into end available ROM.    Personal Factors and Comorbidities  Comorbidity 2    Comorbidities  lymph node removal, radiation    Examination-Activity Limitations  Reach Overhead;Dressing;Hygiene/Grooming    Examination-Participation Restrictions  Yard Work;Cleaning;Laundry;Meal Prep    Stability/Clinical Decision Making  Stable/Uncomplicated    Rehab Potential  Good    PT Frequency  2x / week    PT Duration  4 weeks    PT Treatment/Interventions  ADLs/Self Care Home Management;Manual lymph drainage;Patient/family education;Therapeutic exercise;Therapeutic activities;Passive range of motion;Scar mobilization;Manual techniques;Neuromuscular re-education;Taping    PT Next Visit Plan  continue Rt shoulder ROM and advance HEP PRN, review [place]    PT Home Exercise Plan  Supine dowel exercises and isometrics, [place]    Consulted and Agree with Plan of Care  Patient       Patient will benefit from skilled therapeutic intervention in order to improve the following deficits and impairments:  Decreased mobility, Impaired perceived functional ability, Decreased range of motion, Decreased activity tolerance, Decreased coordination, Decreased knowledge of use of DME, Decreased strength, Increased fascial  restricitons, Impaired flexibility, Impaired UE functional use, Postural dysfunction, Pain  Visit Diagnosis: Abnormal posture  Stiffness of right shoulder, not elsewhere classified  Acute pain of right shoulder  Muscle weakness (generalized)     Problem List Patient Active Problem List   Diagnosis Date Noted  . Port-A-Cath in place 05/17/2018  . Genetic testing 05/10/2018  . Family history of melanoma   .  Family history of kidney cancer   . Malignant neoplasm of upper-outer quadrant of left breast in female, estrogen receptor negative ([place]) 04/11/2018  . Chest pain 05/13/2015  . Shortness of breath 05/13/2015    Otelia Limes, PTA 05/08/2019, 2:07 PM  [place], [place], [address] Phone: [phone]   Fax:  [phone]  Name: Kathryn [name] MRN: [id number] Date of Birth: Oct 07, 1960

## 2019-05-09 ENCOUNTER — Encounter: Payer: BC Managed Care – PPO | Admitting: Adult Health

## 2019-05-10 ENCOUNTER — Other Ambulatory Visit: Payer: Self-pay

## 2019-05-10 ENCOUNTER — Encounter: Payer: Self-pay | Admitting: Rehabilitation

## 2019-05-10 ENCOUNTER — Ambulatory Visit
Admission: RE | Admit: 2019-05-10 | Discharge: 2019-05-10 | Disposition: A | Discharge status: Home / Self Care | Payer: BC Managed Care – PPO | Source: Ambulatory Visit | Attending: Oncology | Admitting: Oncology

## 2019-05-10 ENCOUNTER — Ambulatory Visit: Payer: BC Managed Care – PPO | Admitting: Rehabilitation

## 2019-05-10 DIAGNOSIS — C50412 Malignant neoplasm of upper-outer quadrant of left female breast: Secondary | ICD-10-CM | POA: Diagnosis not present

## 2019-05-10 DIAGNOSIS — Z171 Estrogen receptor negative status [ER-]: Secondary | ICD-10-CM | POA: Diagnosis not present

## 2019-05-10 DIAGNOSIS — M25511 Pain in right shoulder: Secondary | ICD-10-CM | POA: Diagnosis not present

## 2019-05-10 DIAGNOSIS — M6281 Muscle weakness (generalized): Secondary | ICD-10-CM | POA: Diagnosis not present

## 2019-05-10 DIAGNOSIS — Z780 Asymptomatic menopausal state: Secondary | ICD-10-CM | POA: Diagnosis not present

## 2019-05-10 DIAGNOSIS — R293 Abnormal posture: Secondary | ICD-10-CM | POA: Diagnosis not present

## 2019-05-10 DIAGNOSIS — M8588 Other specified disorders of bone density and structure, other site: Secondary | ICD-10-CM | POA: Diagnosis not present

## 2019-05-10 DIAGNOSIS — M25612 Stiffness of left shoulder, not elsewhere classified: Secondary | ICD-10-CM | POA: Diagnosis not present

## 2019-05-10 DIAGNOSIS — M25611 Stiffness of right shoulder, not elsewhere classified: Secondary | ICD-10-CM

## 2019-05-10 DIAGNOSIS — M810 Age-related osteoporosis without current pathological fracture: Secondary | ICD-10-CM | POA: Diagnosis not present

## 2019-05-10 NOTE — Therapy (Signed)
[place], [place], [address] Phone: [phone]   Fax:  [phone]  Physical Therapy Treatment  Patient Details  Name: Kathryn [name] MRN: [id number] Date of Birth: 07-26-1960 Referring Provider (PT): Dr. Jana Hakim, [place]    Encounter Date: 05/10/2019  PT End of Session - 05/10/19 0950    Visit Number  14    Number of Visits  17    Date for PT Re-Evaluation  05/29/19    PT Start Time  0900    PT Stop Time  0947    PT Time Calculation (min)  47 min    Activity Tolerance  Patient tolerated treatment well    Behavior During Therapy  [facility] for tasks assessed/performed       Past Medical History:  Diagnosis Date  . Breast cancer ([place])    stage 3 - left  . Family history of kidney cancer   . Family history of melanoma   . Headache    hormone related, none since 1st child was born   . Personal history of chemotherapy   . Personal history of radiation therapy   . Pleurisy     Past Surgical History:  Procedure Laterality Date  . AXILLARY LYMPH NODE DISSECTION Left 10/11/2018   Procedure: LEFT AXILLARY LYMPH NODE DISSECTION;  Surgeon: Erroll Luna, MD;  Location: [place];  Service: General;  Laterality: Left;  . BREAST LUMPECTOMY Left 08/2018  . BREAST LUMPECTOMY WITH RADIOACTIVE SEED AND SENTINEL LYMPH NODE BIOPSY Left 09/23/2018   Procedure: LEFT BREAST LUMPECTOMY WITH RADIOACTIVE SEED AND LEFT AXILLARY TARGETED LYMPH NODE BIOPY AND  LEFT AXILLARY SENTINEL LYMPH NODE MAPPING;  Surgeon: Erroll Luna, MD;  Location: [place];  Service: General;  Laterality: Left;  . CESAREAN SECTION     x3  . ORIF HUMERUS FRACTURE Right 01/31/2019   Procedure: OPEN REDUCTION INTERNAL FIXATION (ORIF) Right 3 part proximal humerus fracture;  Surgeon: Justice Britain, MD;  [facility];  Service: Orthopedics;  Laterality: Right;  143mn  . PORTACATH PLACEMENT N/A 04/28/2018   Procedure: INSERTION  PORT-A-CATH WITH ULTRASOUND;  Surgeon: CErroll Luna MD;  [place]  Service: General;  Laterality: N/A;    There were no vitals filed for this visit.  Subjective Assessment - 05/10/19 0907    Subjective  I am doing ok    Pertinent History  Neoadjuvant chemotherapy getting through about 50% before having to stop, s/p [facility]pect[facility]23/[facility].  1/1 lymph nodes positive followed by ALND on 10/11/18 with all 7 nodes negative.  Completed radiation  ER/PR/HER2 negative  Pt had fracture of proximal humerus with ORIF on 01/31/2019    Currently in Pain?  No/denies                       [facility] PT Treatment/Exercise - 05/10/19 0001      Shoulder Exercises: Standing   External Rotation  Strengthening;20 reps;Right    Theraband Level (Shoulder External Rotation)  Level 1 (Yellow)    Internal Rotation  Strengthening;20 reps;Theraband;Right    Flexion  Right;20 reps    Theraband Level (Shoulder Flexion)  Level 1 (Yellow)    ABduction  Both;10 reps    Shoulder ABduction Weight (lbs)  2    ABduction Limitations  in front on mirror to watch for shoulder hiking    Row  Strengthening;Theraband;Both;20 reps    Diagonals  10 reps  Diagonals Weight (lbs)  1    Diagonals Limitations  with vcs for performance and 2# being too  hard      Shoulder Exercises: Pulleys   Flexion  2 minutes    Scaption  2 minutes      Shoulder Exercises: Therapy Ball   Flexion  Both;10 reps    Other Therapy Ball Exercises  push up yellow ball x 10      Manual Therapy   Passive ROM  to the Rt shoulder flexion, abduction, ER at various angles, behind the head stretch position to tolerance for all but slightly more aggressive after last good MD report                  PT Long Term Goals - 04/17/19 1353      PT LONG TERM GOAL #1   Title  Pt will have 130 degrees of painfree right shoulder flexion so that she will be able to do houshold chores easier    Baseline  78  on eval, 122 on 04/17/19    Status  Partially Met      PT LONG TERM GOAL #2   Title  Pt will have 130 degrees of active painless right shoulder abduction so that she will be able to perform self care    Baseline  55 on eval, 83 on 04/17/19    Status  Partially Met      PT LONG TERM GOAL #3   Title  Pt will be independent in a HEP for right shoulder ROM and strength    Status  On-going      PT LONG TERM GOAL #4   Title  Pt will decrease QDASH to 8%    Status  On-going            Plan - 05/10/19 0950    Clinical Impression Statement  advanced reps of resistance band exercises and added some more multiplane work with this being more difficult.  Pt with more ROM evident from when this PT last worked with pt. Discussed doing resistance only 3-4x per week now and stretches can continue everyday    PT Frequency  2x / week    PT Duration  4 weeks    PT Treatment/Interventions  ADLs/Self Care Home Management;Manual lymph drainage;Patient/family education;Therapeutic exercise;Therapeutic activities;Passive range of motion;Scar mobilization;Manual techniques;Neuromuscular re-education;Taping    PT Next Visit Plan  continue Rt shoulder ROM and advance general strenghtening as able    PT Home Exercise Plan  Supine dowel exercises and isometrics, rockwoods    Consulted and Agree with Plan of Care  Patient       Patient will benefit from skilled therapeutic intervention in order to improve the following deficits and impairments:     Visit Diagnosis: No diagnosis found.     Problem List Patient Active Problem List   Diagnosis Date Noted  . Port-A-Cath in place 05/17/2018  . Genetic testing 05/10/2018  . Family history of melanoma   . Family history of kidney cancer   . Malignant neoplasm of upper-outer quadrant of left breast in female, estrogen receptor negative ([place]) 04/11/2018  . Chest pain 05/13/2015  . Shortness of breath 05/13/2015    [name] 05/10/2019, 9:52  AM  [place], [place], [address] Phone: [phone]   Fax:  [phone]  Name: Kathryn [name] MRN: [id number] Date of Birth: 1960/07/17

## 2019-05-15 ENCOUNTER — Ambulatory Visit: Payer: BC Managed Care – PPO | Admitting: Rehabilitation

## 2019-05-15 ENCOUNTER — Encounter: Payer: Self-pay | Admitting: Rehabilitation

## 2019-05-15 ENCOUNTER — Other Ambulatory Visit: Payer: Self-pay

## 2019-05-15 DIAGNOSIS — R293 Abnormal posture: Secondary | ICD-10-CM

## 2019-05-15 DIAGNOSIS — M25612 Stiffness of left shoulder, not elsewhere classified: Secondary | ICD-10-CM

## 2019-05-15 DIAGNOSIS — M25511 Pain in right shoulder: Secondary | ICD-10-CM

## 2019-05-15 DIAGNOSIS — M6281 Muscle weakness (generalized): Secondary | ICD-10-CM

## 2019-05-15 DIAGNOSIS — C50412 Malignant neoplasm of upper-outer quadrant of left female breast: Secondary | ICD-10-CM | POA: Diagnosis not present

## 2019-05-15 DIAGNOSIS — Z171 Estrogen receptor negative status [ER-]: Secondary | ICD-10-CM | POA: Diagnosis not present

## 2019-05-15 DIAGNOSIS — M25611 Stiffness of right shoulder, not elsewhere classified: Secondary | ICD-10-CM | POA: Diagnosis not present

## 2019-05-15 NOTE — Therapy (Signed)
[place], [place], [address] Phone: [phone]   Fax:  [phone]  Physical Therapy Treatment  Patient Details  Name: Kathryn [name] MRN: [id number] Date of Birth: 1960-09-29 Referring Provider (PT): Dr. Jana Hakim, [place]    Encounter Date: 05/15/2019  PT End of Session - 05/15/19 0951    Visit Number  15    Number of Visits  17    Date for PT Re-Evaluation  05/29/19    PT Start Time  0904    PT Stop Time  0950    PT Time Calculation (min)  46 min    Activity Tolerance  Patient tolerated treatment well    Behavior During Therapy  [facility] for tasks assessed/performed       Past Medical History:  Diagnosis Date  . Breast cancer ([place])    stage 3 - left  . Family history of kidney cancer   . Family history of melanoma   . Headache    hormone related, none since 1st child was born   . Personal history of chemotherapy   . Personal history of radiation therapy   . Pleurisy     Past Surgical History:  Procedure Laterality Date  . AXILLARY LYMPH NODE DISSECTION Left 10/11/2018   Procedure: LEFT AXILLARY LYMPH NODE DISSECTION;  Surgeon: Erroll Luna, MD;  Location: [place];  Service: General;  Laterality: Left;  . BREAST LUMPECTOMY Left 08/2018  . BREAST LUMPECTOMY WITH RADIOACTIVE SEED AND SENTINEL LYMPH NODE BIOPSY Left 09/23/2018   Procedure: LEFT BREAST LUMPECTOMY WITH RADIOACTIVE SEED AND LEFT AXILLARY TARGETED LYMPH NODE BIOPY AND  LEFT AXILLARY SENTINEL LYMPH NODE MAPPING;  Surgeon: Erroll Luna, MD;  Location: [place];  Service: General;  Laterality: Left;  . CESAREAN SECTION     x3  . ORIF HUMERUS FRACTURE Right 01/31/2019   Procedure: OPEN REDUCTION INTERNAL FIXATION (ORIF) Right 3 part proximal humerus fracture;  Surgeon: Justice Britain, MD;  [facility];  Service: Orthopedics;  Laterality: Right;  135mn  . PORTACATH PLACEMENT N/A 04/28/2018   Procedure: INSERTION  PORT-A-CATH WITH ULTRASOUND;  Surgeon: CErroll Luna MD;  [place]  Service: General;  Laterality: N/A;    There were no vitals filed for this visit.  Subjective Assessment - 05/15/19 0909    Subjective  It is alright.  The pain has moved more to the back of the shoulder now.    Pertinent History  Neoadjuvant chemotherapy getting through about 50% before having to stop, s/p lumpectomy on 09/23/18 [facility].  1/1 lymph nodes positive followed by ALND on 10/11/18 with all 7 nodes negative.  Completed radiation  ER/PR/HER2 negative  Pt had fracture of proximal humerus with ORIF on 01/31/2019    Currently in Pain?  No/denies                       [facility] Treatment/Exercise - 05/15/19 0001      Shoulder Exercises: Supine   Protraction  Both    Protraction Weight (lbs)  2    Other Supine Exercises  triceps 2# bil x 10 with tcs and vcs for completion      Shoulder Exercises: Standing   Flexion  Both;10 reps;Weights    Shoulder Flexion Weight (lbs)  2    Row  Both;15 reps;Theraband    Theraband Level (Shoulder Row)  Level 3 (Green)    Other Standing Exercises  bicep  curls 2# x 10, hammer curl plus overhead press 2# x 10      Shoulder Exercises: Pulleys   Flexion  2 minutes    Scaption  2 minutes      Shoulder Exercises: Therapy Ball   Flexion  Both;10 reps    Other Therapy Ball Exercises  wall push upsx 10 no ball today to make it a bit easier      Manual Therapy   Passive ROM  to the Rt shoulder flexion, abduction, ER at various angles, behind the head stretch position to tolerance for all but slightly more aggressive after last good MD report                  PT Long Term Goals - 04/17/19 1353      PT LONG TERM GOAL #1   Title  Pt will have 130 degrees of painfree right shoulder flexion so that she will be able to do houshold chores easier    Baseline  78 on eval, 122 on 04/17/19    Status  Partially Met      PT LONG TERM GOAL  #2   Title  Pt will have 130 degrees of active painless right shoulder abduction so that she will be able to perform self care    Baseline  55 on eval, 83 on 04/17/19    Status  Partially Met      PT LONG TERM GOAL #3   Title  Pt will be independent in a HEP for right shoulder ROM and strength    Status  On-going      PT LONG TERM GOAL #4   Title  Pt will decrease QDASH to 8%    Status  On-going            Plan - 05/15/19 0177    Clinical Impression Statement  Pt continues to be able to advance exercises and progress PROM with less adhesive capsulitis apparent in the shoulder with PROM.    PT Frequency  2x / week    PT Duration  4 weeks    PT Treatment/Interventions  ADLs/Self Care Home Management;Manual lymph drainage;Patient/family education;Therapeutic exercise;Therapeutic activities;Passive range of motion;Scar mobilization;Manual techniques;Neuromuscular re-education;Taping    PT Next Visit Plan  assess goals and ROM; continue Rt shoulder ROM and advance general strenghtening as able    PT Home Exercise Plan  Supine dowel exercises and isometrics, rockwoods    Consulted and Agree with Plan of Care  Patient       Patient will benefit from skilled therapeutic intervention in order to improve the following deficits and impairments:     Visit Diagnosis: Stiffness of right shoulder, not elsewhere classified  Abnormal posture  Acute pain of right shoulder  Muscle weakness (generalized)  Stiffness of left shoulder, not elsewhere classified  Malignant neoplasm of upper-outer quadrant of left breast in female, estrogen receptor negative ([place])     Problem List Patient Active Problem List   Diagnosis Date Noted  . Port-A-Cath in place 05/17/2018  . Genetic testing 05/10/2018  . Family history of melanoma   . Family history of kidney cancer   . Malignant neoplasm of upper-outer quadrant of left breast in female, estrogen receptor negative ([place]) 04/11/2018  . Chest  pain 05/13/2015  . Shortness of breath 05/13/2015    [name] 05/15/2019, 9:55 AM  [place], [place], [address] Phone: [phone]   Fax:  [phone]  Name: Kathryn [name] MRN: [id number] Date of Birth: 19-Dec-1960

## 2019-05-17 ENCOUNTER — Ambulatory Visit: Payer: BC Managed Care – PPO

## 2019-05-17 ENCOUNTER — Other Ambulatory Visit: Payer: Self-pay

## 2019-05-17 DIAGNOSIS — M25511 Pain in right shoulder: Secondary | ICD-10-CM | POA: Diagnosis not present

## 2019-05-17 DIAGNOSIS — M6281 Muscle weakness (generalized): Secondary | ICD-10-CM

## 2019-05-17 DIAGNOSIS — C50412 Malignant neoplasm of upper-outer quadrant of left female breast: Secondary | ICD-10-CM

## 2019-05-17 DIAGNOSIS — M25612 Stiffness of left shoulder, not elsewhere classified: Secondary | ICD-10-CM | POA: Diagnosis not present

## 2019-05-17 DIAGNOSIS — R293 Abnormal posture: Secondary | ICD-10-CM | POA: Diagnosis not present

## 2019-05-17 DIAGNOSIS — M25611 Stiffness of right shoulder, not elsewhere classified: Secondary | ICD-10-CM

## 2019-05-17 DIAGNOSIS — Z171 Estrogen receptor negative status [ER-]: Secondary | ICD-10-CM | POA: Diagnosis not present

## 2019-05-17 NOTE — Therapy (Signed)
[place], [place], [address] Phone: [phone]   Fax:  [phone]  Physical Therapy Treatment  Patient Details  Name: Kathryn [name] MRN: [id number] Date of Birth: 22-Nov-1960 Referring Provider (PT): Dr. Jana Hakim, [place]    Encounter Date: 05/17/2019  PT End of Session - 05/17/19 1018    Visit Number  16    Number of Visits  17    Date for PT Re-Evaluation  05/29/19    PT Start Time  0906    PT Stop Time  1004    PT Time Calculation (min)  58 min    Activity Tolerance  Patient tolerated treatment well    Behavior During Therapy  [facility] for tasks assessed/performed       Past Medical History:  Diagnosis Date  . Breast cancer ([place])    stage 3 - left  . Family history of kidney cancer   . Family history of melanoma   . Headache    hormone related, none since 1st child was born   . Personal history of chemotherapy   . Personal history of radiation therapy   . Pleurisy     Past Surgical History:  Procedure Laterality Date  . AXILLARY LYMPH NODE DISSECTION Left 10/11/2018   Procedure: LEFT AXILLARY LYMPH NODE DISSECTION;  Surgeon: Erroll Luna, MD;  Location: [place];  Service: General;  Laterality: Left;  . BREAST LUMPECTOMY Left 08/2018  . BREAST LUMPECTOMY WITH RADIOACTIVE SEED AND SENTINEL LYMPH NODE BIOPSY Left 09/23/2018   Procedure: LEFT BREAST LUMPECTOMY WITH RADIOACTIVE SEED AND LEFT AXILLARY TARGETED LYMPH NODE BIOPY AND  LEFT AXILLARY SENTINEL LYMPH NODE MAPPING;  Surgeon: Erroll Luna, MD;  Location: [place];  Service: General;  Laterality: Left;  . CESAREAN SECTION     x3  . ORIF HUMERUS FRACTURE Right 01/31/2019   Procedure: OPEN REDUCTION INTERNAL FIXATION (ORIF) Right 3 part proximal humerus fracture;  Surgeon: Justice Britain, MD;  [facility];  Service: Orthopedics;  Laterality: Right;  135mn  . PORTACATH PLACEMENT N/A 04/28/2018   Procedure: INSERTION  PORT-A-CATH WITH ULTRASOUND;  Surgeon: CErroll Luna MD;  [place]  Service: General;  Laterality: N/A;    There were no vitals filed for this visit.  Subjective Assessment - 05/17/19 0908    Subjective  Just feel my regular soreness in my Rt upper arm. Overall doing fine.    Pertinent History  Neoadjuvant chemotherapy getting through about 50% before having to stop, s/p lumpectomy on 09/23/18 [facility].  1/1 lymph nodes positive followed by ALND on 10/11/18 with all 7 nodes negative.  Completed radiation  ER/PR/HER2 negative  Pt had fracture of proximal humerus with ORIF on 01/31/2019    Patient Stated Goals  to get right arm better    Currently in Pain?  No/denies                       [facility] Treatment/Exercise - 05/17/19 0001      Shoulder Exercises: Supine   Protraction  Right;Left;10 reps;Weights    Protraction Weight (lbs)  2    Protraction Limitations  Tactile cues for correct scapular retraction at end of motion. Pt reports feeling deltoid muscle with this for first time today    Other Supine Exercises  triceps 2# bil x 10 with tcs and vcs to keep UE's in correct position      Shoulder Exercises: Sidelying  External Rotation  Strengthening;Right;20 reps;Weights   2x10   Theraband Level (Shoulder External Rotation)  Level 3 (Green)    External Rotation Limitations  Returned therapist demo      Shoulder Exercises: Standing   Flexion  Both;10 reps;Weights   with back against wall   Shoulder Flexion Weight (lbs)  2    Row  Both;15 reps;Theraband    Theraband Level (Shoulder Row)  Level 3 (Green)    Other Standing Exercises  Forearm walking on wall with no resistance and limited end ROM due to pain in Rt upper arm, 2 x 5; then with yellow theraband for scapular retraction with forearms on wall 2x10 each, tactile cues throughout for correct technique    Other Standing Exercises  bicep curls 2# 2x 10 with back against wall, seated for  hammer curl plus overhead press 2# x 10      Shoulder Exercises: Pulleys   Flexion  2 minutes   added 1 lb to wrists   Scaption  2 minutes   added 1 lb to wrists, pt closer to abduction now than scap     Shoulder Exercises: Therapy Ball   Flexion  Both;10 reps   added 1 lb to wrists; forward lean into end of stretch   Other Therapy Ball Exercises  wall push ups x 10 with demo for increased ROM      Manual Therapy   Passive ROM  to the Rt shoulder flexion, abduction, ER at various angles, behind the head stretch position to tolerance for all but slightly more aggressive after last good MD report                  PT Long Term Goals - 04/17/19 1353      PT LONG TERM GOAL #1   Title  Pt will have 130 degrees of painfree right shoulder flexion so that she will be able to do houshold chores easier    Baseline  78 on eval, 122 on 04/17/19    Status  Partially Met      PT LONG TERM GOAL #2   Title  Pt will have 130 degrees of active painless right shoulder abduction so that she will be able to perform self care    Baseline  55 on eval, 83 on 04/17/19    Status  Partially Met      PT LONG TERM GOAL #3   Title  Pt will be independent in a HEP for right shoulder ROM and strength    Status  On-going      PT LONG TERM GOAL #4   Title  Pt will decrease QDASH to 8%    Status  On-going            Plan - 05/17/19 1021    Clinical Impression Statement  Continued to progress pt as she is able to tolerate due to pain and compensations. She is able to demonstrate better Rt shoulder motion with A/ROM and also beginning to demonstrate improved scapular rhythm as well. Pt did report noticing some fullness off and on past few days at Lt lateral trunk so issued 1/2" gray compression foam for her to wear in her bra there, instructed to stop wearing her bra with underwire and issued order for compression bra at [facility] [place] if she wishes to pursue. She verbalized understadning all.     Personal Factors and Comorbidities  Comorbidity 2    Comorbidities  lymph node removal, radiation  Examination-Activity Limitations  Reach Overhead;Dressing;Hygiene/Grooming    Examination-Participation Restrictions  Yard Work;Cleaning;Laundry;Meal Prep    Stability/Clinical Decision Making  Stable/Uncomplicated    Rehab Potential  Good    PT Frequency  2x / week    PT Duration  4 weeks    PT Treatment/Interventions  ADLs/Self Care Home Management;Manual lymph drainage;Patient/family education;Therapeutic exercise;Therapeutic activities;Passive range of motion;Scar mobilization;Manual techniques;Neuromuscular re-education;Taping    PT Next Visit Plan  assess ROM and MMT for goal assess and renewal next week (1-2 more weeks?); continue Rt shoulder ROM and advance general strenghtening as able    PT Home Exercise Plan  Supine dowel exercises and isometrics, rockwoods    Consulted and Agree with Plan of Care  Patient       Patient will benefit from skilled therapeutic intervention in order to improve the following deficits and impairments:  Decreased mobility, Impaired perceived functional ability, Decreased range of motion, Decreased activity tolerance, Decreased coordination, Decreased knowledge of use of DME, Decreased strength, Increased fascial restricitons, Impaired flexibility, Impaired UE functional use, Postural dysfunction, Pain  Visit Diagnosis: Stiffness of right shoulder, not elsewhere classified  Abnormal posture  Acute pain of right shoulder  Muscle weakness (generalized)  Stiffness of left shoulder, not elsewhere classified  Malignant neoplasm of upper-outer quadrant of left breast in female, estrogen receptor negative ([place])     Problem List Patient Active Problem List   Diagnosis Date Noted  . Port-A-Cath in place 05/17/2018  . Genetic testing 05/10/2018  . Family history of melanoma   . Family history of kidney cancer   . Malignant neoplasm of upper-outer  quadrant of left breast in female, estrogen receptor negative ([place]) 04/11/2018  . Chest pain 05/13/2015  . Shortness of breath 05/13/2015    Otelia Limes, PTA 05/17/2019, 10:29 AM  [place], [place], [address] Phone: [phone]   Fax:  [phone]  Name: Kathryn [name] MRN: [id number] Date of Birth: 1960-11-20
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
## 2019-05-19 ENCOUNTER — Inpatient Hospital Stay: Payer: BC Managed Care – PPO | Admitting: Oncology

## 2019-05-19 ENCOUNTER — Inpatient Hospital Stay: Payer: BC Managed Care – PPO

## 2019-05-19 ENCOUNTER — Telehealth: Payer: Self-pay | Admitting: Oncology

## 2019-05-19 ENCOUNTER — Other Ambulatory Visit: Payer: Self-pay

## 2019-05-19 VITALS — BP 106/88 | HR 99 | Temp 98.3°F | Resp 18 | Ht 64.0 in | Wt 150.9 lb

## 2019-05-19 DIAGNOSIS — Z9221 Personal history of antineoplastic chemotherapy: Secondary | ICD-10-CM | POA: Diagnosis not present

## 2019-05-19 DIAGNOSIS — C773 Secondary and unspecified malignant neoplasm of axilla and upper limb lymph nodes: Secondary | ICD-10-CM | POA: Diagnosis not present

## 2019-05-19 DIAGNOSIS — Z8249 Family history of ischemic heart disease and other diseases of the circulatory system: Secondary | ICD-10-CM | POA: Diagnosis not present

## 2019-05-19 DIAGNOSIS — C50412 Malignant neoplasm of upper-outer quadrant of left female breast: Secondary | ICD-10-CM

## 2019-05-19 DIAGNOSIS — Z171 Estrogen receptor negative status [ER-]: Secondary | ICD-10-CM | POA: Diagnosis not present

## 2019-05-19 DIAGNOSIS — Z79899 Other long term (current) drug therapy: Secondary | ICD-10-CM | POA: Diagnosis not present

## 2019-05-19 LAB — CBC WITH DIFFERENTIAL/PLATELET
Abs Immature Granulocytes: 0.01 10*3/uL (ref 0.00–0.07)
Basophils Absolute: 0 10*3/uL (ref 0.0–0.1)
Basophils Relative: 1 %
Eosinophils Absolute: 0.1 10*3/uL (ref 0.0–0.5)
Eosinophils Relative: 2 %
HCT: 36.8 % (ref 36.0–46.0)
Hemoglobin: 12.5 g/dL (ref 12.0–15.0)
Immature Granulocytes: 0 %
Lymphocytes Relative: 25 %
Lymphs Abs: 0.9 10*3/uL (ref 0.7–4.0)
MCH: 35.1 pg — ABNORMAL HIGH (ref 26.0–34.0)
MCHC: 34 g/dL (ref 30.0–36.0)
MCV: 103.4 fL — ABNORMAL HIGH (ref 80.0–100.0)
Monocytes Absolute: 0.6 10*3/uL (ref 0.1–1.0)
Monocytes Relative: 15 %
Neutro Abs: 2.1 10*3/uL (ref 1.7–7.7)
Neutrophils Relative %: 57 %
Platelets: 184 10*3/uL (ref 150–400)
RBC: 3.56 MIL/uL — ABNORMAL LOW (ref 3.87–5.11)
RDW: 18.4 % — ABNORMAL HIGH (ref 11.5–15.5)
WBC: 3.7 10*3/uL — ABNORMAL LOW (ref 4.0–10.5)
nRBC: 0 % (ref 0.0–0.2)

## 2019-05-19 LAB — COMPREHENSIVE METABOLIC PANEL
ALT: 21 U/L (ref 0–44)
AST: 19 U/L (ref 15–41)
Albumin: 4 g/dL (ref 3.5–5.0)
Alkaline Phosphatase: 111 U/L (ref 38–126)
Anion gap: 11 (ref 5–15)
BUN: 15 mg/dL (ref 6–20)
CO2: 23 mmol/L (ref 22–32)
Calcium: 9.2 mg/dL (ref 8.9–10.3)
Chloride: 105 mmol/L (ref 98–111)
Creatinine, Ser: 0.75 mg/dL (ref 0.44–1.00)
GFR calc Af Amer: >60 mL/min (ref >60)
GFR calc non Af Amer: >60 mL/min (ref >60)
Glucose, Bld: 92 mg/dL (ref 70–99)
Potassium: 3.9 mmol/L (ref 3.5–5.1)
Sodium: 139 mmol/L (ref 135–145)
Total Bilirubin: 0.7 mg/dL (ref 0.3–1.2)
Total Protein: 7 g/dL (ref 6.5–8.1)

## 2019-05-19 NOTE — Telephone Encounter (Signed)
Scheduled appt per 3/25 los. Pt confirmed appt date and time.

## 2019-05-22 ENCOUNTER — Other Ambulatory Visit: Payer: Self-pay

## 2019-05-22 ENCOUNTER — Ambulatory Visit: Payer: BC Managed Care – PPO

## 2019-05-22 DIAGNOSIS — M25611 Stiffness of right shoulder, not elsewhere classified: Secondary | ICD-10-CM

## 2019-05-22 DIAGNOSIS — M6281 Muscle weakness (generalized): Secondary | ICD-10-CM | POA: Diagnosis not present

## 2019-05-22 DIAGNOSIS — R293 Abnormal posture: Secondary | ICD-10-CM

## 2019-05-22 DIAGNOSIS — M25511 Pain in right shoulder: Secondary | ICD-10-CM

## 2019-05-22 DIAGNOSIS — Z171 Estrogen receptor negative status [ER-]: Secondary | ICD-10-CM | POA: Diagnosis not present

## 2019-05-22 DIAGNOSIS — C50412 Malignant neoplasm of upper-outer quadrant of left female breast: Secondary | ICD-10-CM | POA: Diagnosis not present

## 2019-05-22 DIAGNOSIS — M25612 Stiffness of left shoulder, not elsewhere classified: Secondary | ICD-10-CM | POA: Diagnosis not present

## 2019-05-22 NOTE — Therapy (Signed)
[place], [place], [address] Phone: [phone]   Fax:  [phone]  Physical Therapy Treatment  Patient Details  Name: Kathryn [name] MRN: [id number] Date of Birth: 1960-11-15 Referring Provider (PT): Dr. Jana Hakim, Supple    Encounter Date: 05/22/2019  PT End of Session - 05/22/19 1106    Visit Number  17    Number of Visits  17    Date for PT Re-Evaluation  05/29/19    PT Start Time  1011    PT Stop Time  1105    PT Time Calculation (min)  54 min    Activity Tolerance  Patient tolerated treatment well    Behavior During Therapy  [facility] for tasks assessed/performed       Past Medical History:  Diagnosis Date  . Breast cancer ([place])    stage 3 - left  . Family history of kidney cancer   . Family history of melanoma   . Headache    hormone related, none since 1st child was born   . Personal history of chemotherapy   . Personal history of radiation therapy   . Pleurisy     Past Surgical History:  Procedure Laterality Date  . AXILLARY LYMPH NODE DISSECTION Left 10/11/2018   Procedure: LEFT AXILLARY LYMPH NODE DISSECTION;  Surgeon: Erroll Luna, MD;  Location: [place];  Service: General;  Laterality: Left;  . BREAST LUMPECTOMY Left 08/2018  . BREAST LUMPECTOMY WITH RADIOACTIVE SEED AND SENTINEL LYMPH NODE BIOPSY Left 09/23/2018   Procedure: LEFT BREAST LUMPECTOMY WITH RADIOACTIVE SEED AND LEFT AXILLARY TARGETED LYMPH NODE BIOPY AND  LEFT AXILLARY SENTINEL LYMPH NODE MAPPING;  Surgeon: Erroll Luna, MD;  Location: [place];  Service: General;  Laterality: Left;  . CESAREAN SECTION     x3  . ORIF HUMERUS FRACTURE Right 01/31/2019   Procedure: OPEN REDUCTION INTERNAL FIXATION (ORIF) Right 3 part proximal humerus fracture;  Surgeon: Justice Britain, MD;  [facility];  Service: Orthopedics;  Laterality: Right;  160mn  . PORTACATH PLACEMENT N/A 04/28/2018   Procedure: INSERTION  PORT-A-CATH WITH ULTRASOUND;  Surgeon: CErroll Luna MD;  [place]  Service: General;  Laterality: N/A;    There were no vitals filed for this visit.  Subjective Assessment - 05/22/19 1012    Subjective  Soreness at my Rt upper arm, but it just always comes and goes there. And I stopped weraing my bra with the underwire and have been using the compression foam you gave me at my left side and the swelling is gone, just feels tight now at my axilla there.    Pertinent History  Neoadjuvant chemotherapy getting through about 50% before having to stop, s/p lumpect[facility] on [facility]23/[facility]  1/1 lymph nodes positive followed by ALND on 10/11/18 with all 7 nodes negative.  Completed radiation  ER/PR/HER2 negative  Pt had fracture of proximal humerus with ORIF on 01/31/2019    Patient Stated Goals  to get right arm better    Currently in Pain?  No/denies                       [facility]/Exercise - 05/22/19 0001      Shoulder Exercises: Supine   Protraction  Right;Left;20 reps;Weights   2x10   Protraction Weight (lbs)  2   only with Rt UE   Other Supine Exercises  triceps 2# Rt UE, 2 x  10 with tcs and vcs to keep UE's in correct position      Shoulder Exercises: Sidelying   External Rotation  Right;20 reps;Weights   2x10   External Rotation Weight (lbs)  2    External Rotation Limitations  Pt with correct demo      Shoulder Exercises: Pulleys   Flexion  2 minutes    ABduction  2 minutes   pt with improved ROM and able to abduction     Shoulder Exercises: Therapy Ball   Flexion  Both;10 reps   1 lb on wrists; forward lean into end of stretch   ABduction  Right;Left;5 reps   1 lb on wrists; same side lean into end of stretch   ABduction Limitations  Tactile cues to decrease Rt scauplar compensation    Other Therapy Ball Exercises  wall push ups x 12 with demo for increased ROM, then pt able to return correct demo      Manual Therapy    Manual Therapy  Myofascial release;Passive ROM    Myofascial Release  To Lt axilla where pt c/o tightness since some swelling noted last week or so    Passive ROM  to the Rt shoulder flexion, abduction, ER at various angles, behind the head stretch position to tolerance for all but slightly more aggressive after last good MD report; then briefly to Lt shoulder into flexion, abduction and D2 to tolerance                  PT Long Term Goals - 04/17/19 1353      PT LONG TERM GOAL #1   Title  Pt will have 130 degrees of painfree right shoulder flexion so that she will be able to do houshold chores easier    Baseline  78 on eval, 122 on 04/17/19    Status  Partially Met      PT LONG TERM GOAL #2   Title  Pt will have 130 degrees of active painless right shoulder abduction so that she will be able to perform self care    Baseline  55 on eval, 83 on 04/17/19    Status  Partially Met      PT LONG TERM GOAL #3   Title  Pt will be independent in a HEP for right shoulder ROM and strength    Status  On-going      PT LONG TERM GOAL #4   Title  Pt will decrease QDASH to 8%    Status  On-going            Plan - 05/22/19 1227    Clinical Impression Statement  Continued with Rt UE strength and manual therapy working to improve function of Rt shoulder. Pt continues to demonstrate improved A/ROM and scapular mobility. At end of session briefly included myofascial and P/ROM to Lt shoulder/axilla where pt c/o tightness since increased swelling noted at her lateral trunk here last week. Pt reports this feeling better by end of session and swelling has been imrpoved since wearing compression foam and stopped wearing underwire bra.    Personal Factors and Comorbidities  Comorbidity 2    Comorbidities  lymph node removal, radiation    Examination-Activity Limitations  Reach Overhead;Dressing;Hygiene/Grooming    Examination-Participation Restrictions  Yard Work;Cleaning;Laundry;Meal Prep     Stability/Clinical Decision Making  Stable/Uncomplicated    Rehab Potential  Good    PT Frequency  2x / week    PT Duration  4 weeks  PT Treatment/Interventions  ADLs/Self Care Home Management;Manual lymph drainage;Patient/family education;Therapeutic exercise;Therapeutic activities;Passive range of motion;Scar mobilization;Manual techniques;Neuromuscular re-education;Taping    PT Next Visit Plan  assess ROM and MMT for goal assess and renewal next week (1-2 more weeks?); continue Rt shoulder ROM and advance general strenghtening as able    PT Home Exercise Plan  Supine dowel exercises and isometrics, rockwoods    Consulted and Agree with Plan of Care  Patient       Patient will benefit from skilled therapeutic intervention in order to improve the following deficits and impairments:  Decreased mobility, Impaired perceived functional ability, Decreased range of motion, Decreased activity tolerance, Decreased coordination, Decreased knowledge of use of DME, Decreased strength, Increased fascial restricitons, Impaired flexibility, Impaired UE functional use, Postural dysfunction, Pain  Visit Diagnosis: Stiffness of right shoulder, not elsewhere classified  Abnormal posture  Acute pain of right shoulder  Muscle weakness (generalized)  Stiffness of left shoulder, not elsewhere classified  Malignant neoplasm of upper-outer quadrant of left breast in female, estrogen receptor negative ([place])     Problem List Patient Active Problem List   Diagnosis Date Noted  . Port-A-Cath in place 05/17/2018  . Genetic testing 05/10/2018  . Family history of melanoma   . Family history of kidney cancer   . Malignant neoplasm of upper-outer quadrant of left breast in female, estrogen receptor negative ([place]) 04/11/2018  . Chest pain 05/13/2015  . Shortness of breath 05/13/2015    Otelia Limes, PTA 05/22/2019, 1:27 PM  [place], [place], [address] Phone: [phone]   Fax:  [phone]  Name: JAMIRA BARFUSS MRN: [id number] Date of Birth: [birth year]

## 2019-05-24 ENCOUNTER — Other Ambulatory Visit: Payer: Self-pay

## 2019-05-24 ENCOUNTER — Ambulatory Visit: Payer: BC Managed Care – PPO

## 2019-05-24 DIAGNOSIS — M25511 Pain in right shoulder: Secondary | ICD-10-CM | POA: Diagnosis not present

## 2019-05-24 DIAGNOSIS — M25611 Stiffness of right shoulder, not elsewhere classified: Secondary | ICD-10-CM | POA: Diagnosis not present

## 2019-05-24 DIAGNOSIS — M6281 Muscle weakness (generalized): Secondary | ICD-10-CM

## 2019-05-24 DIAGNOSIS — Z171 Estrogen receptor negative status [ER-]: Secondary | ICD-10-CM | POA: Diagnosis not present

## 2019-05-24 DIAGNOSIS — C50412 Malignant neoplasm of upper-outer quadrant of left female breast: Secondary | ICD-10-CM

## 2019-05-24 DIAGNOSIS — R293 Abnormal posture: Secondary | ICD-10-CM

## 2019-05-24 DIAGNOSIS — M25612 Stiffness of left shoulder, not elsewhere classified: Secondary | ICD-10-CM

## 2019-05-24 NOTE — Therapy (Addendum)
[place], [place], [address] Phone: [phone]   Fax:  [phone]  Physical Therapy Progress Note  Progress Note Reporting Period 03/21/2019 to 05/25/2019  See note below for Objective Data and Assessment of Progress/Goals.       Patient Details  Name: Kathryn [name] MRN: [id number] Date of Birth: 11-Jun-1960 Referring Provider (PT): Dr. Jana Hakim, [place]    Encounter Date: 05/24/2019  PT End of Session - 05/24/19 1100    Visit Number  18    Number of Visits  25    Date for PT Re-Evaluation  06/21/19    PT Start Time  0957    PT Stop Time  1059    PT Time Calculation (min)  62 min    Activity Tolerance  Patient tolerated treatment well    Behavior During Therapy  [facility] for tasks assessed/performed       Past Medical History:  Diagnosis Date  . Breast cancer ([place])    stage 3 - left  . Family history of kidney cancer   . Family history of melanoma   . Headache    hormone related, none since 1st child was born   . Personal history of chemotherapy   . Personal history of radiation therapy   . Pleurisy     Past Surgical History:  Procedure Laterality Date  . AXILLARY LYMPH NODE DISSECTION Left 10/11/2018   Procedure: LEFT AXILLARY LYMPH NODE DISSECTION;  Surgeon: Erroll Luna, MD;  Location: [place];  Service: General;  Laterality: Left;  . BREAST LUMPECTOMY Left 08/2018  . BREAST LUMPECTOMY WITH RADIOACTIVE SEED AND SENTINEL LYMPH NODE BIOPSY Left 09/23/2018   Procedure: LEFT BREAST LUMPECTOMY WITH RADIOACTIVE SEED AND LEFT AXILLARY TARGETED LYMPH NODE BIOPY AND  LEFT AXILLARY SENTINEL LYMPH NODE MAPPING;  Surgeon: Erroll Luna, MD;  Location: [place];  Service: General;  Laterality: Left;  . CESAREAN SECTION     x3  . ORIF HUMERUS FRACTURE Right 01/31/2019   Procedure: OPEN REDUCTION INTERNAL FIXATION (ORIF) Right 3 part proximal humerus fracture;  Surgeon: Justice Britain, MD;  [facility];  Service: Orthopedics;  Laterality: Right;  124mn  . PORTACATH PLACEMENT N/A 04/28/2018   Procedure: INSERTION PORT-A-CATH WITH ULTRASOUND;  Surgeon: CErroll Luna MD;  [place]  Service: General;  Laterality: N/A;    There were no vitals filed for this visit.  Subjective Assessment - 05/24/19 1003    Subjective  Just feeling the same soreness in my upper arm but that is improving some. I notice it's not there all the time now. And it's most common in the morning if I wake up on that side, but that's improvement bc I couldn't sleep on that side at all after the sling came off.    Pertinent History  Neoadjuvant chemotherapy getting through about 50% before having to stop, s/[facility] lumpect[facility] 09[facility]23/18 [facility]  1/1 lymph nodes positive followed by ALND on 10/11/18 with all 7 nodes negative.  Completed radiation  ER/PR/HER2 negative  Pt had fracture of proximal humerus with ORIF on 01/31/2019    Patient Stated Goals  to get right arm better    Currently in Pain?  No/denies                               PT Education - 05/24/19 1015    Education Details  Bil  UE 3 way raises    Person(s) Educated  Patient    Methods  Explanation;Demonstration;Handout    Comprehension  Verbalized understanding;Returned demonstration          PT Long Term Goals - 05/24/19 1303      PT LONG TERM GOAL #1   Title  Pt will have 130 degrees of painfree right shoulder flexion so that she will be able to do houshold chores easier    Baseline  78 on eval, 122 on 04/17/19; 133 degrees - 05/24/19    Status  Achieved      PT LONG TERM GOAL #2   Title  Pt will have 130 degrees of active painless right shoulder abduction so that she will be able to perform self care    Baseline  55 on eval, 83 on 04/17/19; 117 degrees - 05/24/19    Time  4    Period  Weeks    Status  On-going    Target Date  06/21/19      PT LONG TERM GOAL #3   Title  Pt will be  independent in a HEP for right shoulder ROM and strength    Baseline  Pt is independent with current HEP, though this was progressed today-05/24/19    Time  4    Period  Weeks    Status  Partially Met    Target Date  06/21/19      PT LONG TERM GOAL #4   Title  Pt will decrease QDASH to 8%    Baseline  18.18 on 11/24/18; 4.55 on 12/20/18    Time  4    Period  Weeks    Status  On-going    Target Date  06/21/19            Plan - 05/24/19 1525    Clinical Impression Statement  Pts A/ROM has progressed well since start of care. She has met her flexion A/ROM goal and is progressing well towards her abduction A/ROM goal. She is alos independent with HEP inssued thus far, though this was progressed today to include bil UE 3 way raises which she tolerated well. Pt will benefit from continued therapy at this time to work towards further improving her end ROM, scapular mobility and UE strength so renewal sent to MD today.    Personal Factors and Comorbidities  Comorbidity 2    Comorbidities  lymph node removal, radiation    Examination-Activity Limitations  Reach Overhead;Dressing;Hygiene/Grooming    Examination-Participation Restrictions  Yard Work;Cleaning;Laundry;Meal Prep    Stability/Clinical Decision Making  Stable/Uncomplicated    Rehab Potential  Good    PT Frequency  2x / week    PT Duration  4 weeks    PT Treatment/Interventions  ADLs/Self Care Home Management;Manual lymph drainage;Patient/family education;Therapeutic exercise;Therapeutic activities;Passive range of motion;Scar mobilization;Manual techniques;Neuromuscular re-education;Taping    PT Next Visit Plan  Renewal done this visit to cont 2x/wk for up to 4 more weeks. Cont Rt shoulder ROM and advance strength, including scapular, as able. Also cont Lt shoulder stretching and axillary MFR.    PT Home Exercise Plan  Supine dowel exercises and isometrics, rockwoods; bil UE 3 way raises    Consulted and Agree with Plan of Care   Patient       Patient will benefit from skilled therapeutic intervention in order to improve the following deficits and impairments:  Decreased mobility, Impaired perceived functional ability, Decreased range of motion, Decreased activity tolerance, Decreased coordination, Decreased knowledge of  use of DME, Decreased strength, Increased fascial restricitons, Impaired flexibility, Impaired UE functional use, Postural dysfunction, Pain  Visit Diagnosis: Stiffness of right shoulder, not elsewhere classified  Abnormal posture  Acute pain of right shoulder  Muscle weakness (generalized)  Stiffness of left shoulder, not elsewhere classified  Malignant neoplasm of upper-outer quadrant of left breast in female, estrogen receptor negative ([place])     Problem List Patient Active Problem List   Diagnosis Date Noted  . Port-A-Cath in place 05/17/2018  . Genetic testing 05/10/2018  . Family history of melanoma   . Family history of kidney cancer   . Malignant neoplasm of upper-outer quadrant of left breast in female, estrogen receptor negative ([place]) 04/11/2018  . Chest pain 05/13/2015  . Shortness of breath 05/13/2015    Ander Purpura, PTA 05/25/2019, 3:27 PM  Tomma Rakers, PT 05/25/19 3:27 PM   [place], [place], [address] Phone: [phone]   Fax:  [phone]  Name: Kathryn [name] MRN: [id number] Date of Birth: 11-15-1960

## 2019-05-24 NOTE — Patient Instructions (Signed)

## 2019-05-25 NOTE — Addendum Note (Signed)
Addended by: Ander Purpura on: 05/25/2019 03:30 PM   Modules accepted: Orders

## 2019-05-30 ENCOUNTER — Other Ambulatory Visit: Payer: Self-pay

## 2019-05-30 ENCOUNTER — Ambulatory Visit: Payer: BC Managed Care – PPO | Attending: Oncology

## 2019-05-30 DIAGNOSIS — R293 Abnormal posture: Secondary | ICD-10-CM | POA: Diagnosis not present

## 2019-05-30 DIAGNOSIS — M6281 Muscle weakness (generalized): Secondary | ICD-10-CM | POA: Insufficient documentation

## 2019-05-30 DIAGNOSIS — M25612 Stiffness of left shoulder, not elsewhere classified: Secondary | ICD-10-CM | POA: Insufficient documentation

## 2019-05-30 DIAGNOSIS — M25611 Stiffness of right shoulder, not elsewhere classified: Secondary | ICD-10-CM | POA: Diagnosis not present

## 2019-05-30 DIAGNOSIS — Z171 Estrogen receptor negative status [ER-]: Secondary | ICD-10-CM | POA: Diagnosis not present

## 2019-05-30 DIAGNOSIS — M25511 Pain in right shoulder: Secondary | ICD-10-CM | POA: Insufficient documentation

## 2019-05-30 DIAGNOSIS — C50412 Malignant neoplasm of upper-outer quadrant of left female breast: Secondary | ICD-10-CM | POA: Diagnosis not present

## 2019-05-30 NOTE — Therapy (Signed)
[place], [place], [address] Phone: [phone]   Fax:  [phone]  Physical Therapy Treatment  Patient Details  Name: Kathryn [name] MRN: [id number] Date of Birth: [birth year] Referring Provider (PT): Dr. Jana Hakim, [place]    Encounter Date: 05/30/2019  PT End of Session - 05/30/19 1106    Visit Number  19    Number of Visits  25    Date for PT Re-Evaluation  06/21/19    PT Start Time  1583    PT Stop Time  1103    PT Time Calculation (min)  61 min    Activity Tolerance  Patient tolerated treatment well    Behavior During Therapy  [facility] for tasks assessed/performed       Past Medical History:  Diagnosis Date  . Breast cancer ([place])    stage 3 - left  . Family history of kidney cancer   . Family history of melanoma   . Headache    hormone related, none since 1st child was born   . Personal history of chemotherapy   . Personal history of radiation therapy   . Pleurisy     Past Surgical History:  Procedure Laterality Date  . AXILLARY LYMPH NODE DISSECTION Left 10/11/2018   Procedure: LEFT AXILLARY LYMPH NODE DISSECTION;  Surgeon: Erroll Luna, MD;  Location: [place];  Service: General;  Laterality: Left;  . BREAST LUMPECTOMY Left 08/2018  . BREAST LUMPECTOMY WITH RADIOACTIVE SEED AND SENTINEL LYMPH NODE BIOPSY Left 09/23/2018   Procedure: LEFT BREAST LUMPECTOMY WITH RADIOACTIVE SEED AND LEFT AXILLARY TARGETED LYMPH NODE BIOPY AND  LEFT AXILLARY SENTINEL LYMPH NODE MAPPING;  Surgeon: Erroll Luna, MD;  Location: [place];  Service: General;  Laterality: Left;  . CESAREAN SECTION     x3  . ORIF HUMERUS FRACTURE Right 01/31/2019   Procedure: OPEN REDUCTION INTERNAL FIXATION (ORIF) Right 3 part proximal humerus fracture;  Surgeon: Justice Britain, MD;  [facility];  Service: Orthopedics;  Laterality: Right;  167mn  . PORTACATH PLACEMENT N/A 04/28/2018   Procedure: INSERTION  PORT-A-CATH WITH ULTRASOUND;  Surgeon: CErroll Luna MD;  [place]  Service: General;  Laterality: N/A;    There were no vitals filed for this visit.  Subjective Assessment - 05/30/19 1005    Subjective  I went to the beach over the weekend with my family and we had a really nice time. I didn't get to walk as much as I wanted because it was just too cold. But I did walk about a mile yesterday and it felt good. My Rt shoulder is sore today from using it while we were there and I did my exercises/sttches while we were there and I bought a 2 lb weight and have been using that. So I'm just sore because I've been able to be more active.    Pertinent History  Neoadjuvant chemotherapy getting through about 50% before having to stop, s/[facility]pect[facility].  1/1 lymph nodes positive followed by ALND on 10/11/18 with all 7 nodes negative.  Completed radiation  ER/PR/HER2 negative  Pt had fracture of proximal humerus with ORIF on 01/31/2019    Patient Stated Goals  to get right arm better    Currently in Pain?  Yes    Pain Score  3     Pain Location  Shoulder    Pain Orientation  Right    Pain Descriptors / Indicators  Sharp    Pain Type  Surgical pain    Pain Onset  More than a month ago    Pain Frequency  Intermittent    Aggravating Factors   I've just been using my arm more                       [facility] Adult PT Treatment/Exercise - 05/30/19 0001      Shoulder Exercises: Supine   Protraction  Strengthening;Right;20 reps    Protraction Weight (lbs)  3   2x10 total: 3# 1st set, then 2 # 2nd set   Other Supine Exercises  triceps 3# Rt UE, 1 x 10, then 2# 1 x 10 with brief tcs to keep UE in correct position, then able to return correct demo      Shoulder Exercises: Standing   Other Standing Exercises  Bil UE 3 way raises with 1 lb on wrists into flexion, scaption and abduction to shoulder height x10 each with back, shoulders, and head against wall; wall  push ups x10    Other Standing Exercises  Forearm walking up wall 5x each side, no resistance, then with forerams on wall and core engaged, for scapular retraction, x10 each side      Shoulder Exercises: Pulleys   Flexion  2 minutes   1 lb on wrists   ABduction  2 minutes   1 lb on wrist     Shoulder Exercises: Therapy Ball   Flexion  Both;10 reps   1 lb on wrist; forward lean into end of stretch   ABduction  Right;10 reps   1 lb on wrist; same side lean into end of stretch     Manual Therapy   Myofascial Release  To Lt axilla     Passive ROM  to the Rt shoulder flexion, abduction, er at various angles,  then to Lt shoulder into flexion, abduction and D2 to tolerance                  PT Long Term Goals - 05/24/19 1303      PT LONG TERM GOAL #1   Title  Pt will have 130 degrees of painfree right shoulder flexion so that she will be able to do houshold chores easier    Baseline  78 on eval, 122 on 04/17/19; 133 degrees - 05/24/19    Status  Achieved      PT LONG TERM GOAL #2   Title  Pt will have 130 degrees of active painless right shoulder abduction so that she will be able to perform self care    Baseline  55 on eval, 83 on 04/17/19; 117 degrees - 05/24/19    Time  4    Period  Weeks    Status  On-going    Target Date  06/21/19      PT LONG TERM GOAL #3   Title  Pt will be independent in a HEP for right shoulder ROM and strength    Baseline  Pt is independent with current HEP, though this was progressed today-05/24/19    Time  4    Period  Weeks    Status  Partially Met    Target Date  06/21/19      PT LONG TERM GOAL #4   Title  Pt will decrease QDASH to 8%    Baseline  18.18 on 11/24/18; 4.55 on 12/20/18    Time  4    Period  Weeks  Status  On-going    Target Date  06/21/19            Plan - 05/30/19 1106    Clinical Impression Statement  Pt continues to make good progress with tolerating progression of strengthening exercises and aggressive  stretching of Rt shoulder. She sees orthopedist tomorrow.    Personal Factors and Comorbidities  Comorbidity 2    Comorbidities  lymph node removal, radiation    Examination-Activity Limitations  Reach Overhead;Dressing;Hygiene/Grooming    Examination-Participation Restrictions  Yard Work;Cleaning;Laundry;Meal Prep    Stability/Clinical Decision Making  Stable/Uncomplicated    Rehab Potential  Good    PT Frequency  2x / week    PT Duration  4 weeks    PT Treatment/Interventions  ADLs/Self Care Home Management;Manual lymph drainage;Patient/family education;Therapeutic exercise;Therapeutic activities;Passive range of motion;Scar mobilization;Manual techniques;Neuromuscular re-education;Taping    PT Next Visit Plan  How was doctors appt? Cont Rt shoulder ROM and advance strength, including scapular, as able. Also cont Lt shoulder stretching and axillary MFR.    PT Home Exercise Plan  Supine dowel exercises and isometrics, rockwoods; bil UE 3 way raises    Consulted and Agree with Plan of Care  Patient       Patient will benefit from skilled therapeutic intervention in order to improve the following deficits and impairments:  Decreased mobility, Impaired perceived functional ability, Decreased range of motion, Decreased activity tolerance, Decreased coordination, Decreased knowledge of use of DME, Decreased strength, Increased fascial restricitons, Impaired flexibility, Impaired UE functional use, Postural dysfunction, Pain  Visit Diagnosis: Stiffness of right shoulder, not elsewhere classified  Abnormal posture  Acute pain of right shoulder  Muscle weakness (generalized)  Stiffness of left shoulder, not elsewhere classified  Malignant neoplasm of upper-outer quadrant of left breast in female, estrogen receptor negative ([place])     Problem List Patient Active Problem List   Diagnosis Date Noted  . Port-A-Cath in place 05/17/2018  . Genetic testing 05/10/2018  . Family history of  melanoma   . Family history of kidney cancer   . Malignant neoplasm of upper-outer quadrant of left breast in female, estrogen receptor negative ([place]) 04/11/2018  . Chest pain 05/13/2015  . Shortness of breath 05/13/2015    Otelia Limes, PTA 05/30/2019, 12:25 PM  [place], [place], [address] Phone: [phone]   Fax:  [phone]  Name: Kathryn [name] MRN: [id number] Date of Birth: December 02, 1960

## 2019-05-31 DIAGNOSIS — S42201D Unspecified fracture of upper end of right humerus, subsequent encounter for fracture with routine healing: Secondary | ICD-10-CM | POA: Diagnosis not present

## 2019-05-31 DIAGNOSIS — Z4789 Encounter for other orthopedic aftercare: Secondary | ICD-10-CM | POA: Diagnosis not present

## 2019-06-02 ENCOUNTER — Telehealth: Payer: Self-pay

## 2019-06-02 ENCOUNTER — Encounter: Payer: Self-pay | Admitting: Rehabilitation

## 2019-06-02 ENCOUNTER — Other Ambulatory Visit: Payer: Self-pay

## 2019-06-02 ENCOUNTER — Ambulatory Visit: Payer: BC Managed Care – PPO | Admitting: Rehabilitation

## 2019-06-02 DIAGNOSIS — C50412 Malignant neoplasm of upper-outer quadrant of left female breast: Secondary | ICD-10-CM | POA: Diagnosis not present

## 2019-06-02 DIAGNOSIS — M6281 Muscle weakness (generalized): Secondary | ICD-10-CM | POA: Diagnosis not present

## 2019-06-02 DIAGNOSIS — M25511 Pain in right shoulder: Secondary | ICD-10-CM

## 2019-06-02 DIAGNOSIS — R293 Abnormal posture: Secondary | ICD-10-CM

## 2019-06-02 DIAGNOSIS — Z171 Estrogen receptor negative status [ER-]: Secondary | ICD-10-CM | POA: Diagnosis not present

## 2019-06-02 DIAGNOSIS — M25611 Stiffness of right shoulder, not elsewhere classified: Secondary | ICD-10-CM | POA: Diagnosis not present

## 2019-06-02 DIAGNOSIS — M25612 Stiffness of left shoulder, not elsewhere classified: Secondary | ICD-10-CM | POA: Diagnosis not present

## 2019-06-02 NOTE — Progress Notes (Signed)
06/02/2019 1100 SWOG [id number] STUDY Reviewed patient eligibility criteria with another Clinical Research Nurse, Doristine Johns. Eligibility questions subsequently forwarded to SWOG study to determine if patient is eligible with < 1 cm residual disease and clarify dates for registration. Will await feedback from SWOG.  06/02/2019 1558 SWOG [id number] STUDY TCT to [facility] Pathology department, spoke with Varney Biles, informed that I will fax a pre-consent pathology request to determine slide availability for potential enrollment in the SWOG [id number] study. Advised NOT to prepare or send any slides at this time: determine availability only. Keisha verbalizes understanding. Request form faxed and successful transmission report received. Will await response from pathology department.  Dionne Bucy. Sharlett Iles, BSN, RN, CIC 06/02/2019 4:21 PM

## 2019-06-02 NOTE — Therapy (Signed)
[place], [place], [address] Phone: [phone]   Fax:  [phone]  Physical Therapy Treatment  Patient Details  Name: Kathryn [name] MRN: [id number] Date of Birth: October 25, 1960 Referring Provider (PT): Dr. Jana Hakim, Supple    Encounter Date: 06/02/2019  PT End of Session - 06/02/19 1221    Visit Number  20    Number of Visits  26   30 visit limit   Date for PT Re-Evaluation  06/29/19    Authorization - Visit Number  78    Authorization - Number of Visits  20    PT Start Time  1106    PT Stop Time  4742    PT Time Calculation (min)  50 min    Activity Tolerance  Patient tolerated treatment well    Behavior During Therapy  [facility] for tasks assessed/performed       Past Medical History:  Diagnosis Date  . Breast cancer ([place])    stage 3 - left  . Family history of kidney cancer   . Family history of melanoma   . Headache    hormone related, none since 1st child was born   . Personal history of chemotherapy   . Personal history of radiation therapy   . Pleurisy     Past Surgical History:  Procedure Laterality Date  . AXILLARY LYMPH NODE DISSECTION Left 10/11/2018   Procedure: LEFT AXILLARY LYMPH NODE DISSECTION;  Surgeon: Erroll Luna, MD;  Location: [place];  Service: General;  Laterality: Left;  . BREAST LUMPECTOMY Left 08/2018  . BREAST LUMPECTOMY WITH RADIOACTIVE SEED AND SENTINEL LYMPH NODE BIOPSY Left 09/23/2018   Procedure: LEFT BREAST LUMPECTOMY WITH RADIOACTIVE SEED AND LEFT AXILLARY TARGETED LYMPH NODE BIOPY AND  LEFT AXILLARY SENTINEL LYMPH NODE MAPPING;  Surgeon: Erroll Luna, MD;  Location: [place];  Service: General;  Laterality: Left;  . CESAREAN SECTION     x3  . ORIF HUMERUS FRACTURE Right 01/31/2019   Procedure: OPEN REDUCTION INTERNAL FIXATION (ORIF) Right 3 part proximal humerus fracture;  Surgeon: Justice Britain, MD;  Location: [facility];  Service: Orthopedics;   Laterality: Right;  156mn  . PORTACATH PLACEMENT N/A 04/28/2018   Procedure: INSERTION PORT-A-CATH WITH ULTRASOUND;  Surgeon: CErroll Luna MD;  [place] [facility]  Service: General;  Laterality: N/A;    There were no vitals filed for this visit.  Subjective Assessment - 06/02/19 1109    Subjective  I saw the orthopedic PA, the xrays looked good and she was very happy.  She wants me to do 1 more month    Pertinent History  Neoadjuvant chemotherapy getting through about 50% before having to stop, s/p lumpectomy on 09/23/18 due to [employer] DCIS.  1/1 lymph nodes positive followed by ALND on 10/11/18 with all 7 nodes negative.  Completed radiation  ER/PR/HER2 negative  Pt had fracture of proximal humerus with ORIF on 01/31/2019    Patient Stated Goals  to get right arm better         [facility] - 06[facility] 0001      AROM   Overall AROM Comments  behind the back R: to T12, L: T8    Right Shoulder Flexion  121 Degrees    Right Shoulder ABduction  135 Degrees   some scaption included   Right Shoulder Internal Rotation  45 Degrees   in supine   Right Shoulder External Rotation  80 Degrees    Left  Shoulder Flexion  135 Degrees    Left Shoulder ABduction  132 Degrees    Left Shoulder Internal Rotation  45 Degrees    Left Shoulder External Rotation  75 Degrees      Strength   Right Shoulder Flexion  4-/5    Right Shoulder ABduction  4/5    Right Shoulder Internal Rotation  4+/5    Right Shoulder External Rotation  4/5                   [facility] Adult PT Treatment/Exercise - 06/02/19 0001      Shoulder Exercises: Sidelying   External Rotation  Right;Weights    External Rotation Weight (lbs)  2    External Rotation Limitations  3x10 with towel roll and PT hand target for cueing      Shoulder Exercises: Standing   Other Standing Exercises  Bil UE 3 way raises with 2 lb on Rt wrist only  into flexion, scaption and abduction to shoulder height x10 each with back,  shoulders, and head against wall; wall push ups x10      Shoulder Exercises: Pulleys   Flexion  2 minutes    Flexion Limitations  2# weight on bil wrists    ABduction  2 minutes    ABduction Limitations  2# weight on bil wrists      Shoulder Exercises: Therapy Ball   Flexion  Both;10 reps    Flexion Limitations  1# weight on bil wrists    ABduction  Right;10 reps    ABduction Limitations  1# weight bil wrists      Shoulder Exercises: ROM/Strengthening   Other ROM/Strengthening Exercises  towel IR stretch 5x6"       Manual Therapy   Passive ROM  to the Rt shoulder flexion, abduction, er at various angles,  then to Lt shoulder into flexion, abduction and D2 to tolerance                  PT Long Term Goals - 06/02/19 1228      PT LONG TERM GOAL #1   Title  Pt will have 130 degrees of painfree right shoulder flexion so that she will be able to do houshold chores easier    Baseline  78 on eval, 122 on 04/17/19; 133 degrees - 05/24/19, 135 on 06/02/19    Status  Achieved      PT LONG TERM GOAL #2   Title  Pt will have 130 degrees of active painless right shoulder abduction so that she will be able to perform self care    Baseline  55 on eval, 83 on 04/17/19; 117 degrees - 05/24/19, 121 06/02/19    Status  Partially Met      PT LONG TERM GOAL #3   Title  Pt will be independent in a HEP for right shoulder ROM and strength    Status  On-going      PT LONG TERM GOAL #4   Title  Pt will decrease QDASH to 8%    Status  On-going            Plan - 06/02/19 1223    Clinical Impression Statement  Pt continues to improve her Rt shoulder AROM and strength with both remeasured today. Some weakness continues into ER, flexion and abduction.  Pt still with some decreased scapulo humeral separation with flexion and abduction but continues to improve.  Pt started to have some lateral trunk/breast swelling after doing repetitive  resistance with both arm so she is in the process of getting  a compression bra and has been doing AROM only with the Lt UE.  Xray and MD visit this week with MD wanting 1 more month of PT.  Pt also reported a 90% ROM return is expected which pt has already acheived.    Personal Factors and Comorbidities  Comorbidity 2    Comorbidities  lymph node removal, radiation    Examination-Activity Limitations  Reach Overhead;Dressing;Hygiene/Grooming    Examination-Participation Restrictions  Yard Work;Cleaning;Laundry;Meal Prep    PT Frequency  2x / week    PT Duration  4 weeks    PT Treatment/Interventions  ADLs/Self Care Home Management;Manual lymph drainage;Patient/family education;Therapeutic exercise;Therapeutic activities;Passive range of motion;Scar mobilization;Manual techniques;Neuromuscular re-education;Taping    PT Next Visit Plan  Cont Rt shoulder ROM and advance strength, including scapular, as able. Also cont Lt shoulder stretching and axillary MFR PRN/edema management    Consulted and Agree with Plan of Care  Patient       Patient will benefit from skilled therapeutic intervention in order to improve the following deficits and impairments:  Decreased mobility, Impaired perceived functional ability, Decreased range of motion, Decreased activity tolerance, Decreased coordination, Decreased knowledge of use of DME, Decreased strength, Increased fascial restricitons, Impaired flexibility, Impaired UE functional use, Postural dysfunction, Pain  Visit Diagnosis: Stiffness of right shoulder, not elsewhere classified  Abnormal posture  Acute pain of right shoulder  Muscle weakness (generalized)  Malignant neoplasm of upper-outer quadrant of left breast in female, estrogen receptor negative (HCC)  Stiffness of left shoulder, not elsewhere classified     Problem List Patient Active Problem List   Diagnosis Date Noted  . Port-A-Cath in place 05/17/2018  . Genetic testing 05/10/2018  . Family history of melanoma   . Family history of kidney  cancer   . Malignant neoplasm of upper-outer quadrant of left breast in female, estrogen receptor negative ([place]) 04/11/2018  . Chest pain 05/13/2015  . Shortness of breath 05/13/2015    [name] 06/02/2019, 12:30 PM  [place], [place], [address] Phone: [phone]   Fax:  [phone]  Name: Kathryn [name] MRN: [id number] Date of Birth: 1960/11/02

## 2019-06-02 NOTE — Telephone Encounter (Signed)
See progress note  Dionne Bucy. Sharlett Iles, BSN, RN, CIC 06/02/2019 4:35 PM

## 2019-06-05 ENCOUNTER — Other Ambulatory Visit: Payer: Self-pay | Admitting: Oncology

## 2019-06-05 DIAGNOSIS — C50412 Malignant neoplasm of upper-outer quadrant of left female breast: Secondary | ICD-10-CM

## 2019-06-05 NOTE — Research (Signed)
06/05/2019 0928 SWOG [id number] STUDY Received call from Varney Biles at [facility] department: not enough tissue is available for submission to the [id number] study. Message forwarded to Dr. Jana Hakim so he is aware. Still awaiting feedback from study.  Dionne Bucy. Sharlett Iles, BSN, RN, CIC 06/05/2019 9:36 AM

## 2019-06-07 ENCOUNTER — Other Ambulatory Visit: Payer: Self-pay

## 2019-06-07 ENCOUNTER — Encounter: Payer: Self-pay | Admitting: Physical Therapy

## 2019-06-07 ENCOUNTER — Ambulatory Visit: Payer: BC Managed Care – PPO | Admitting: Physical Therapy

## 2019-06-07 DIAGNOSIS — M25611 Stiffness of right shoulder, not elsewhere classified: Secondary | ICD-10-CM | POA: Diagnosis not present

## 2019-06-07 DIAGNOSIS — M25612 Stiffness of left shoulder, not elsewhere classified: Secondary | ICD-10-CM

## 2019-06-07 DIAGNOSIS — M6281 Muscle weakness (generalized): Secondary | ICD-10-CM

## 2019-06-07 DIAGNOSIS — R293 Abnormal posture: Secondary | ICD-10-CM

## 2019-06-07 DIAGNOSIS — M25511 Pain in right shoulder: Secondary | ICD-10-CM

## 2019-06-07 DIAGNOSIS — Z171 Estrogen receptor negative status [ER-]: Secondary | ICD-10-CM | POA: Diagnosis not present

## 2019-06-07 DIAGNOSIS — C50412 Malignant neoplasm of upper-outer quadrant of left female breast: Secondary | ICD-10-CM | POA: Diagnosis not present

## 2019-06-07 NOTE — Patient Instructions (Addendum)
[email or web address] Resources Self care videos Self MLD for upper extremity   [email or web address] Lymphatic flow series with Joylene Grapes Crotzer

## 2019-06-07 NOTE — Therapy (Signed)
[place], [place], [address] Phone: [phone]   Fax:  [phone]  Physical Therapy Treatment  Patient Details  Name: Kathryn [name] MRN: [id number] Date of Birth: Apr 27, 1960 Referring Provider (PT): Dr. Jana Hakim, Supple    Encounter Date: 06/07/2019  PT End of Session - 06/07/19 1642    Visit Number  21    Number of Visits  26    Date for PT Re-Evaluation  06/29/19    Authorization - Visit Number  30    PT Start Time  1200    PT Stop Time  2025    PT Time Calculation (min)  55 min    Activity Tolerance  Patient tolerated treatment well       Past Medical History:  Diagnosis Date  . Breast cancer ([place])    stage 3 - left  . Family history of kidney cancer   . Family history of melanoma   . Headache    hormone related, none since 1st child was born   . Personal history of chemotherapy   . Personal history of radiation therapy   . Pleurisy     Past Surgical History:  Procedure Laterality Date  . AXILLARY LYMPH NODE DISSECTION Left 10/11/2018   Procedure: LEFT AXILLARY LYMPH NODE DISSECTION;  Surgeon: Erroll Luna, MD;  Location: [place];  Service: General;  Laterality: Left;  . BREAST LUMPECTOMY Left 08/2018  . BREAST LUMPECTOMY WITH RADIOACTIVE SEED AND SENTINEL LYMPH NODE BIOPSY Left 09/23/2018   Procedure: LEFT BREAST LUMPECTOMY WITH RADIOACTIVE SEED AND LEFT AXILLARY TARGETED LYMPH NODE BIOPY AND  LEFT AXILLARY SENTINEL LYMPH NODE MAPPING;  Surgeon: Erroll Luna, MD;  Location: [place];  Service: General;  Laterality: Left;  . CESAREAN SECTION     x3  . ORIF HUMERUS FRACTURE Right 01/31/2019   Procedure: OPEN REDUCTION INTERNAL FIXATION (ORIF) Right 3 part proximal humerus fracture;  Surgeon: Justice Britain, MD;  [facility];  Service: Orthopedics;  Laterality: Right;  172mn  . PORTACATH PLACEMENT N/A 04/28/2018   Procedure: INSERTION PORT-A-CATH WITH ULTRASOUND;   Surgeon: CErroll Luna MD;  [facility]  Service: General;  Laterality: N/A;    There were no vitals filed for this visit.  Subjective Assessment - 06/07/19 1644    Subjective  Pt showed me the xray of her shoulder with pins, screws and plates She said she is doing well.  She is going to get her appt with Second to NPetra KubaShe has had some swelling in her left  index finger and has decided she will stop wearing her ring there.  She elevated her hand and it felt better, but she has not knowledge of manual lymph drainage if it should happen again. She has a sleeve and gauntlet, but no glove.    Pertinent History  Neoadjuvant chemotherapy getting through about 50% before having to stop, s/p lumpectomy on 09/23/18 [facility].  1/1 lymph nodes positive followed by ALND on 10/11/18 with all 7 nodes negative.  Completed radiation  ER/PR/HER2 negative  Pt had fracture of proximal humerus with ORIF on 01/31/2019    Patient Stated Goals  to get right arm better    Currently in Pain?  Yes    Pain Score  --   did not rate, its the same           LYMPHEDEMA/ONCOLOGY QUESTIONNAIRE - 06/07/19 1211      Left Upper Extremity Lymphedema  10 cm Proximal to Olecranon Process  29.5 cm    Olecranon Process  25 cm    15 cm Proximal to Ulnar Styloid Process  24 cm    10 cm Proximal to Ulnar Styloid Process  22.5 cm    Just Proximal to Ulnar Styloid Process  15.5 cm    Across Hand at [employer]  18 cm    At [place] of 2nd Digit  6 cm                [facility] Adult PT Treatment/Exercise - 06/07/19 0001      Exercises   Exercises  Other Exercises    Other Exercises   neck and upper thoracic ROM in preparation for MLD, right hand and finger ROM with elevation for edema control       Shoulder Exercises: Sidelying   Flexion  AROM;Right;10 reps    Other Sidelying Exercises  small circles with hand to ceiling with both arms       Manual Therapy   Manual Therapy  Edema  management;Manual Lymphatic Drainage (MLD)    Manual therapy comments  gave pt link for klosetraining self care MLD videos     Edema Management  Remeasured arm, slight increases in finger and forearm     Manual Lymphatic Drainage (MLD)  Instructed and performed, short neck, diaphragmatic breaths. left inguinal nodes and axillo inguinal anastamosis with hand over hand instruction to full area at upper lateral chest, left shoulder, arm , hand and finger with return along pathways,  Also showed technique in sidelying              PT Education - 06/07/19 1641    Education Details  seld MLD    Person(s) Educated  Patient    Methods  Explanation;Other (comment)   weblinks   Comprehension  Need further instruction          PT Long Term Goals - 06/02/19 1228      PT LONG TERM GOAL #1   Title  Pt will have 130 degrees of painfree right shoulder flexion so that she will be able to do houshold chores easier    Baseline  78 on eval, 122 on 04/17/19; 133 degrees - 05/24/19, 135 on 06/02/19    Status  Achieved      PT LONG TERM GOAL #2   Title  Pt will have 130 degrees of active painless right shoulder abduction so that she will be able to perform self care    Baseline  55 on eval, 83 on 04/17/19; 117 degrees - 05/24/19, 121 06/02/19    Status  Partially Met      PT LONG TERM GOAL #3   Title  Pt will be independent in a HEP for right shoulder ROM and strength    Status  On-going      PT LONG TERM GOAL #4   Title  Pt will decrease QDASH to 8%    Status  On-going            Plan - 06/07/19 1642    Clinical Impression Statement  Pt has had some problem with swelling in her left index finger and lateral upper chest so focues on manua lymph drainage and remedial exercise today.    Personal Factors and Comorbidities  Comorbidity 2    Comorbidities  lymph node removal, radiation    Examination-Participation Restrictions  Yard Work;Cleaning;Laundry;Meal Prep    Stability/Clinical Decision  Making  Stable/Uncomplicated  Rehab Potential  Good    PT Frequency  2x / week    PT Duration  4 weeks    PT Treatment/Interventions  ADLs/Self Care Home Management;Manual lymph drainage;Patient/family education;Therapeutic exercise;Therapeutic activities;Passive range of motion;Scar mobilization;Manual techniques;Neuromuscular re-education;Taping    PT Next Visit Plan  Reveiw MLD as needed for left index finger and lateral chest. Cont Rt shoulder ROM and advance strength, including scapular, as able. Also cont Lt shoulder stretching and axillary MFR PRN/edema management    PT Home Exercise Plan  Supine dowel exercises and isometrics, rockwoods; bil UE 3 way raises    Consulted and Agree with Plan of Care  Patient       Patient will benefit from skilled therapeutic intervention in order to improve the following deficits and impairments:  Decreased mobility, Impaired perceived functional ability, Decreased range of motion, Decreased activity tolerance, Decreased coordination, Decreased knowledge of use of DME, Decreased strength, Increased fascial restricitons, Impaired flexibility, Impaired UE functional use, Postural dysfunction, Pain  Visit Diagnosis: Stiffness of right shoulder, not elsewhere classified  Abnormal posture  Acute pain of right shoulder  Muscle weakness (generalized)  Stiffness of left shoulder, not elsewhere classified     Problem List Patient Active Problem List   Diagnosis Date Noted  . Port-A-Cath in place 05/17/2018  . Genetic testing 05/10/2018  . Family history of melanoma   . Family history of kidney cancer   . Malignant neoplasm of upper-outer quadrant of left breast in female, estrogen receptor negative ([place]) 04/11/2018  . Chest pain 05/13/2015  . Shortness of breath 05/13/2015   Donato Heinz. Owens Shark PT  Norwood Levo 06/07/2019, 4:46 PM  [place], [place],  [address] Phone: [phone]   Fax:  [phone]  Name: Kathryn [name] MRN: [id number] Date of Birth: [birth year]
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
## 2019-06-08 ENCOUNTER — Inpatient Hospital Stay: Payer: BC Managed Care – PPO

## 2019-06-08 ENCOUNTER — Inpatient Hospital Stay: Payer: BC Managed Care – PPO | Attending: Oncology | Admitting: Oncology

## 2019-06-08 ENCOUNTER — Other Ambulatory Visit: Payer: Self-pay

## 2019-06-08 VITALS — BP 109/77 | HR 101 | Temp 98.3°F | Resp 18 | Ht 64.0 in | Wt 153.3 lb

## 2019-06-08 DIAGNOSIS — M818 Other osteoporosis without current pathological fracture: Secondary | ICD-10-CM | POA: Diagnosis not present

## 2019-06-08 DIAGNOSIS — Z79899 Other long term (current) drug therapy: Secondary | ICD-10-CM | POA: Insufficient documentation

## 2019-06-08 DIAGNOSIS — C50412 Malignant neoplasm of upper-outer quadrant of left female breast: Secondary | ICD-10-CM

## 2019-06-08 DIAGNOSIS — C773 Secondary and unspecified malignant neoplasm of axilla and upper limb lymph nodes: Secondary | ICD-10-CM | POA: Diagnosis not present

## 2019-06-08 DIAGNOSIS — Z171 Estrogen receptor negative status [ER-]: Secondary | ICD-10-CM | POA: Diagnosis not present

## 2019-06-08 DIAGNOSIS — Z95828 Presence of other vascular implants and grafts: Secondary | ICD-10-CM

## 2019-06-08 DIAGNOSIS — C50112 Malignant neoplasm of central portion of left female breast: Secondary | ICD-10-CM | POA: Insufficient documentation

## 2019-06-08 LAB — CBC WITH DIFFERENTIAL/PLATELET
Abs Immature Granulocytes: 0.01 10*3/uL (ref 0.00–0.07)
Basophils Absolute: 0 10*3/uL (ref 0.0–0.1)
Basophils Relative: 1 %
Eosinophils Absolute: 0.1 10*3/uL (ref 0.0–0.5)
Eosinophils Relative: 2 %
HCT: 34.2 % — ABNORMAL LOW (ref 36.0–46.0)
Hemoglobin: 11.5 g/dL — ABNORMAL LOW (ref 12.0–15.0)
Immature Granulocytes: 0 %
Lymphocytes Relative: 31 %
Lymphs Abs: 1.2 10*3/uL (ref 0.7–4.0)
MCH: 35.8 pg — ABNORMAL HIGH (ref 26.0–34.0)
MCHC: 33.6 g/dL (ref 30.0–36.0)
MCV: 106.5 fL — ABNORMAL HIGH (ref 80.0–100.0)
Monocytes Absolute: 0.5 10*3/uL (ref 0.1–1.0)
Monocytes Relative: 13 %
Neutro Abs: 2 10*3/uL (ref 1.7–7.7)
Neutrophils Relative %: 53 %
Platelets: 187 10*3/uL (ref 150–400)
RBC: 3.21 MIL/uL — ABNORMAL LOW (ref 3.87–5.11)
RDW: 18.5 % — ABNORMAL HIGH (ref 11.5–15.5)
WBC: 3.8 10*3/uL — ABNORMAL LOW (ref 4.0–10.5)
nRBC: 0 % (ref 0.0–0.2)

## 2019-06-08 LAB — COMPREHENSIVE METABOLIC PANEL
ALT: 20 U/L (ref 0–44)
AST: 21 U/L (ref 15–41)
Albumin: 4 g/dL (ref 3.5–5.0)
Alkaline Phosphatase: 115 U/L (ref 38–126)
Anion gap: 9 (ref 5–15)
BUN: 12 mg/dL (ref 6–20)
CO2: 26 mmol/L (ref 22–32)
Calcium: 9 mg/dL (ref 8.9–10.3)
Chloride: 105 mmol/L (ref 98–111)
Creatinine, Ser: 0.82 mg/dL (ref 0.44–1.00)
GFR calc Af Amer: >60 mL/min (ref >60)
GFR calc non Af Amer: >60 mL/min (ref >60)
Glucose, Bld: 93 mg/dL (ref 70–99)
Potassium: 4.3 mmol/L (ref 3.5–5.1)
Sodium: 140 mmol/L (ref 135–145)
Total Bilirubin: 0.6 mg/dL (ref 0.3–1.2)
Total Protein: 6.9 g/dL (ref 6.5–8.1)

## 2019-06-08 MED ORDER — HEPARIN SOD (PORK) LOCK FLUSH 100 UNIT/ML IV SOLN
500.0000 [IU] | Freq: Once | INTRAVENOUS | Status: AC
  Administered 2019-06-08: 500 [IU]
  Filled 2019-06-08: qty 5

## 2019-06-08 MED ORDER — SODIUM CHLORIDE 0.9% FLUSH
10.0000 mL | Freq: Once | INTRAVENOUS | Status: AC
  Administered 2019-06-08: 10 mL
  Filled 2019-06-08: qty 10

## 2019-06-09 ENCOUNTER — Encounter: Payer: Self-pay | Admitting: Oncology

## 2019-06-09 ENCOUNTER — Ambulatory Visit: Payer: Self-pay | Admitting: Surgery

## 2019-06-09 DIAGNOSIS — C50912 Malignant neoplasm of unspecified site of left female breast: Secondary | ICD-10-CM | POA: Diagnosis not present

## 2019-06-09 DIAGNOSIS — Z95828 Presence of other vascular implants and grafts: Secondary | ICD-10-CM | POA: Diagnosis not present

## 2019-06-09 NOTE — H&P (Signed)
Kathryn [name] Documented: 06/09/2019 9:08 AM Location: [facility] Patient #: [id number] DOB: [birth year] Married / Language: [name] / Race: White Female  History of Present Illness [name] A. Tenia Goh MD; 06/09/2019 9:19 AM) Patient words: The patient returns for follow-up of her stage II breast cancer. She had fallen about 4 months ago and fractured her right humerus. That is now healed up. She has completed chemotherapy and is here to discuss port removal. She has no other complaints.  The patient is a 59 year old female.   Allergies (Chanel Teressa Senter, CMA; 06/09/2019 9:08 AM) No Known Allergies [04/11/2018]: No Known Drug Allergies [06/09/2019]: Allergies Reconciled  Medication History (Chanel Teressa Senter, CMA; 06/09/2019 9:09 AM) Capecitabine (500MG  Tablet, Oral) Active. Claritin (Oral) Specific strength unknown - Active. Medications Reconciled    Vitals (Chanel Nolan CMA; 06/09/2019 9:09 AM) 06/09/2019 9:09 AM Weight: 153.25 lb Height: 64in Body Surface Area: 1.75 m Body Mass Index: 26.31 kg/m  Temp.: 97.88F  Pulse: 114 (Regular)  BP: 108/70 (Sitting, Left Arm, Standard)        Physical Exam (Brode Sculley A. Airam Runions MD; 06/09/2019 9:20 AM)  General Mental Status-Alert. General Appearance-Consistent with stated age. Hydration-Well hydrated. Voice-Normal.  Breast Note: Port right upper chest noted.  Cardiovascular Cardiovascular examination reveals -normal heart sounds, regular rate and rhythm with no murmurs and normal pedal pulses bilaterally.  Neurologic Neurologic evaluation reveals -alert and oriented x 3 with no impairment of recent or remote memory. Mental Status-Normal.    Assessment & Plan (Gaylon Bentz A. Anel Purohit MD; 06/09/2019 9:20 AM)  PORT-A-CATH IN PLACE [id number]) Impression: SCHEDULE FOR PORT REMOVAL TOTAL TIME 20 MIN discussing surgery, palpitations, treated options, face-to-face time and documentation.  Current  Plans I recommended surgery to remove the catheter. I explained the technique of removal with use of local anesthesia & possible need for more aggressive sedation/anesthesia for patient comfort.  Risks such as bleeding, infection, and other risks were discussed. Post-operative dressing/incision care was discussed. I noted a good likelihood this will help address the problem. We will work to minimize complications. Questions were answered. The patient expresses understanding & wishes to proceed with surgery.  You are being scheduled for surgery- Our schedulers will call you.  You should hear from our office's scheduling department within 5 working days about the location, date, and time of surgery. We try to make accommodations for patient's preferences in scheduling surgery, but sometimes the OR schedule or the surgeon's schedule prevents [place] from making those accommodations.  If you have not heard from our office [phone]) in 5 working days, call the office and ask for your surgeon's nurse.  If you have other questions about your diagnosis, plan, or surgery, call the office and ask for your surgeon's nurse.  Pt Education - [facility] General Post-op HCI  BREAST CANCER, LEFT (C50.912) Impression: stable NAD

## 2019-06-12 ENCOUNTER — Telehealth: Payer: Self-pay | Admitting: Oncology

## 2019-06-12 NOTE — Telephone Encounter (Signed)
Scheduled appts per 4/15 los. Pt confirmed appt date and time.

## 2019-06-13 ENCOUNTER — Ambulatory Visit: Payer: BC Managed Care – PPO

## 2019-06-13 ENCOUNTER — Other Ambulatory Visit: Payer: Self-pay | Admitting: Oncology

## 2019-06-15 ENCOUNTER — Other Ambulatory Visit: Payer: Self-pay

## 2019-06-15 ENCOUNTER — Encounter: Payer: Self-pay | Admitting: Physical Therapy

## 2019-06-15 ENCOUNTER — Ambulatory Visit: Payer: BC Managed Care – PPO | Admitting: Physical Therapy

## 2019-06-15 DIAGNOSIS — C50412 Malignant neoplasm of upper-outer quadrant of left female breast: Secondary | ICD-10-CM | POA: Diagnosis not present

## 2019-06-15 DIAGNOSIS — M25612 Stiffness of left shoulder, not elsewhere classified: Secondary | ICD-10-CM | POA: Diagnosis not present

## 2019-06-15 DIAGNOSIS — R293 Abnormal posture: Secondary | ICD-10-CM

## 2019-06-15 DIAGNOSIS — M6281 Muscle weakness (generalized): Secondary | ICD-10-CM | POA: Diagnosis not present

## 2019-06-15 DIAGNOSIS — M25611 Stiffness of right shoulder, not elsewhere classified: Secondary | ICD-10-CM

## 2019-06-15 DIAGNOSIS — Z171 Estrogen receptor negative status [ER-]: Secondary | ICD-10-CM | POA: Diagnosis not present

## 2019-06-15 DIAGNOSIS — M25511 Pain in right shoulder: Secondary | ICD-10-CM | POA: Diagnosis not present

## 2019-06-15 NOTE — Therapy (Signed)
[place], [place], [address] Phone: [phone]   Fax:  [phone]  Physical Therapy Treatment  Patient Details  Name: Kathryn [name] MRN: [id number] Date of Birth: [birth year] Referring Provider (PT): Dr. Jana Hakim, Supple    Encounter Date: 06/15/2019  PT End of Session - 06/15/19 1418    Visit Number  22    Number of Visits  26    Date for PT Re-Evaluation  06/29/19    PT Start Time  1300    PT Stop Time  1345    PT Time Calculation (min)  45 min    Activity Tolerance  Patient tolerated treatment well    Behavior During Therapy  [facility] for tasks assessed/performed       Past Medical History:  Diagnosis Date  . Breast cancer ([place])    stage 3 - left  . Family history of kidney cancer   . Family history of melanoma   . Headache    hormone related, none since 1st child was born   . Personal history of chemotherapy   . Personal history of radiation therapy   . Pleurisy     Past Surgical History:  Procedure Laterality Date  . AXILLARY LYMPH NODE DISSECTION Left 10/11/2018   Procedure: LEFT AXILLARY LYMPH NODE DISSECTION;  Surgeon: Erroll Luna, MD;  Location: [place];  Service: General;  Laterality: Left;  . BREAST LUMPECTOMY Left 08/2018  . BREAST LUMPECTOMY WITH RADIOACTIVE SEED AND SENTINEL LYMPH NODE BIOPSY Left 09/23/2018   Procedure: LEFT BREAST LUMPECTOMY WITH RADIOACTIVE SEED AND LEFT AXILLARY TARGETED LYMPH NODE BIOPY AND  LEFT AXILLARY SENTINEL LYMPH NODE MAPPING;  Surgeon: Erroll Luna, MD;  Location: [place];  Service: General;  Laterality: Left;  . CESAREAN SECTION     x3  . ORIF HUMERUS FRACTURE Right 01/31/2019   Procedure: OPEN REDUCTION INTERNAL FIXATION (ORIF) Right 3 part proximal humerus fracture;  Surgeon: Justice Britain, MD;  [facility];  Service: Orthopedics;  Laterality: Right;  128mn  . PORTACATH PLACEMENT N/A 04/28/2018   Procedure: INSERTION  PORT-A-CATH WITH ULTRASOUND;  Surgeon: CErroll Luna MD;  [place]  Service: General;  Laterality: N/A;    There were no vitals filed for this visit.  Subjective Assessment - 06/15/19 1309    Subjective  [place] the [place]. She said that her right arm has been a little more sore that ususal.  She might be going into a trial for a drug that will decrease the risk of cancer coming back and will also increase bone calcium    Pertinent History  Neoadjuvant chemotherapy getting through about 50% before having to stop, s/[facility]pect[facility].  1/1 lymph nodes positive followed by ALND on 10/11/18 with all 7 nodes negative.  Completed radiation  ER/PR/HER2 negative  Pt had fracture of proximal humerus with ORIF on 01/31/2019    Patient Stated Goals  to get right arm better    Currently in Pain?  Yes    Pain Score  5     Pain Location  Shoulder    Pain Orientation  Right    Pain Descriptors / Indicators  Aching                       [facility] Adult PT Treatment/Exercise - 06/15/19 0001      Exercises   Exercises  Other Exercises  Other Exercises   neck and upper thoracic ROM in preparation for MLD, right hand and finger ROM with elevation for edema control       Knee/Hip Exercises: Standing   Other Standing Knee Exercises  heel drops x 5 reps       Shoulder Exercises: Supine   Diagonals  Strengthening;Right;Left;10 reps;Weights    Diagonals Weight (lbs)  1    Diagonals Limitations  perfomed with manual resistant at hand for flexion and elevation with both arms.  Pt able to give even resistance throughout full range of motion     Other Supine Exercises  hand pointed to ceiling with small increaseding to medium controlled circles and return       Shoulder Exercises: Sidelying   External Rotation  Right;10 reps;Weights    External Rotation Weight (lbs)  2    External Rotation Limitations  isometrics x 5 reps with 2 pound wieight     Flexion   Strengthening;Right;10 reps    Flexion Limitations  cues to keep hand level with shoulder throughout     ABduction  Strengthening;Right;10 reps   to 90 degrees    ABduction Limitations  cues to slow control of  arm to return to side for eccentric strengthening     Other Sidelying Exercises  small circles with hand to ceiling with both arms       Shoulder Exercises: Standing   External Rotation  Strengthening;Right;Left;10 reps   walkouts one step with cues to keep arm in position    Theraband Level (Shoulder External Rotation)  Level 3 (Green)    Extension  Strengthening;Right;Left;10 reps;Theraband    Theraband Level (Shoulder Extension)  Level 3 (Green)    Row  Strengthening;Right;Left;10 reps    Theraband Level (Shoulder Row)  Level 3 (Green)    Retraction  Strengthening;Right;Left;5 reps;Theraband    Theraband Level (Shoulder Retraction)  Level 3 (Green)      Shoulder Exercises: ROM/Strengthening   Wall Pushups  10 reps    Wall Pushups Limitations  10 reps of shoulder taps , progressed to doing this in L position with hands on elevated mat                   PT Long Term Goals - 06/02/19 1228      PT LONG TERM GOAL #1   Title  Pt will have 130 degrees of painfree right shoulder flexion so that she will be able to do houshold chores easier    Baseline  78 on eval, 122 on 04/17/19; 133 degrees - 05/24/19, 135 on 06/02/19    Status  Achieved      PT LONG TERM GOAL #2   Title  Pt will have 130 degrees of active painless right shoulder abduction so that she will be able to perform self care    Baseline  55 on eval, 83 on 04/17/19; 117 degrees - 05/24/19, 121 06/02/19    Status  Partially Met      PT LONG TERM GOAL #3   Title  Pt will be independent in a HEP for right shoulder ROM and strength    Status  On-going      PT LONG TERM GOAL #4   Title  Pt will decrease QDASH to 8%    Status  On-going            Plan - 06/15/19 1419    Clinical Impression Statement  Pt  thinks the swelling in her left index finger  is better. She has been elevating and exercising and feels that she knows what to do to control her swelling.  She is interested in getting a compression glove for her hand and made an appt to get measured on May 7 Demographics sent to [facility].  focused on core and UE strengthening today. Pt demonstates increased strength in right arm with better core stability during exercise.    Comorbidities  lymph node removal, radiation    Examination-Activity Limitations  Reach Overhead;Dressing;Hygiene/Grooming    Examination-Participation Restrictions  Yard Work;Cleaning;Laundry;Meal Prep    Stability/Clinical Decision Making  Stable/Uncomplicated    Rehab Potential  Good    PT Frequency  2x / week    PT Duration  4 weeks    PT Treatment/Interventions  ADLs/Self Care Home Management;Manual lymph drainage;Patient/family education;Therapeutic exercise;Therapeutic activities;Passive range of motion;Scar mobilization;Manual techniques;Neuromuscular re-education;Taping    PT Next Visit Plan  Cont Rt shoulder ROM and advance strength, including scapular, as able. Also cont Lt shoulder stretching and axillary MFR PRN/edema management Reveiw MLD as needed for left index finger and lateral chest    Consulted and Agree with Plan of Care  Patient       Patient will benefit from skilled therapeutic intervention in order to improve the following deficits and impairments:  Decreased mobility, Impaired perceived functional ability, Decreased range of motion, Decreased activity tolerance, Decreased coordination, Decreased knowledge of use of DME, Decreased strength, Increased fascial restricitons, Impaired flexibility, Impaired UE functional use, Postural dysfunction, Pain  Visit Diagnosis: Stiffness of right shoulder, not elsewhere classified  Abnormal posture  Acute pain of right shoulder  Muscle weakness (generalized)  Stiffness of left shoulder, not elsewhere  classified     Problem List Patient Active Problem List   Diagnosis Date Noted  . Port-A-Cath in place 05/17/2018  . Genetic testing 05/10/2018  . Family history of melanoma   . Family history of kidney cancer   . Malignant neoplasm of upper-outer quadrant of left breast in female, estrogen receptor negative ([place]) 04/11/2018  . Chest pain 05/13/2015  . Shortness of breath 05/13/2015   Donato Heinz. Owens Shark PT  Norwood Levo 06/15/2019, 2:24 PM  [place], [place], [address] Phone: [phone]   Fax:  [phone]  Name: CRESTA RIDEN MRN: [id number] Date of Birth: January 05, 1961

## 2019-06-19 ENCOUNTER — Other Ambulatory Visit: Payer: Self-pay

## 2019-06-19 ENCOUNTER — Encounter: Payer: Self-pay | Admitting: Rehabilitation

## 2019-06-19 ENCOUNTER — Ambulatory Visit: Payer: BC Managed Care – PPO | Admitting: Rehabilitation

## 2019-06-19 DIAGNOSIS — Z171 Estrogen receptor negative status [ER-]: Secondary | ICD-10-CM | POA: Diagnosis not present

## 2019-06-19 DIAGNOSIS — M25612 Stiffness of left shoulder, not elsewhere classified: Secondary | ICD-10-CM | POA: Diagnosis not present

## 2019-06-19 DIAGNOSIS — M25611 Stiffness of right shoulder, not elsewhere classified: Secondary | ICD-10-CM

## 2019-06-19 DIAGNOSIS — M6281 Muscle weakness (generalized): Secondary | ICD-10-CM

## 2019-06-19 DIAGNOSIS — C50412 Malignant neoplasm of upper-outer quadrant of left female breast: Secondary | ICD-10-CM

## 2019-06-19 DIAGNOSIS — R293 Abnormal posture: Secondary | ICD-10-CM

## 2019-06-19 DIAGNOSIS — M25511 Pain in right shoulder: Secondary | ICD-10-CM

## 2019-06-19 NOTE — Therapy (Signed)
[place], [place], [address] Phone: [phone]   Fax:  [phone]  Physical Therapy Treatment  Patient Details  Name: Kathryn [name] MRN: [id number] Date of Birth: 1960/09/11 Referring Provider (PT): Dr. Jana Hakim, [place]    Encounter Date: 06/19/2019  PT End of Session - 06/19/19 1127    Visit Number  23    Number of Visits  26    Date for PT Re-Evaluation  06/29/19    Authorization - Visit Number  102    Authorization - Number of Visits  23    PT Start Time  1103    PT Stop Time  1143    PT Time Calculation (min)  40 min    Activity Tolerance  Patient tolerated treatment well    Behavior During Therapy  [facility] for tasks assessed/performed       Past Medical History:  Diagnosis Date  . Breast cancer ([place])    stage 3 - left  . Family history of kidney cancer   . Family history of melanoma   . Headache    hormone related, none since 1st child was born   . Personal history of chemotherapy   . Personal history of radiation therapy   . Pleurisy     Past Surgical History:  Procedure Laterality Date  . AXILLARY LYMPH NODE DISSECTION Left 10/11/2018   Procedure: LEFT AXILLARY LYMPH NODE DISSECTION;  Surgeon: Erroll Luna, MD;  Location: [place];  Service: General;  Laterality: Left;  . BREAST LUMPECTOMY Left 08/2018  . BREAST LUMPECTOMY WITH RADIOACTIVE SEED AND SENTINEL LYMPH NODE BIOPSY Left 09/23/2018   Procedure: LEFT BREAST LUMPECTOMY WITH RADIOACTIVE SEED AND LEFT AXILLARY TARGETED LYMPH NODE BIOPY AND  LEFT AXILLARY SENTINEL LYMPH NODE MAPPING;  Surgeon: Erroll Luna, MD;  Location: [place];  Service: General;  Laterality: Left;  . CESAREAN SECTION     x3  . ORIF HUMERUS FRACTURE Right 01/31/2019   Procedure: OPEN REDUCTION INTERNAL FIXATION (ORIF) Right 3 part proximal humerus fracture;  Surgeon: Justice Britain, MD;  [facility];  Service: Orthopedics;  Laterality:  Right;  163mn  . PORTACATH PLACEMENT N/A 04/28/2018   Procedure: INSERTION PORT-A-CATH WITH ULTRASOUND;  Surgeon: CErroll Luna MD;  [place]  Service: General;  Laterality: N/A;    There were no vitals filed for this visit.  Subjective Assessment - 06/19/19 1104    Subjective  Its always just a little sore    Pertinent History  Neoadjuvant chemotherapy getting through about 50% before having to stop, s/p lumpectomy on 09[facility]23/18 [facility].  1/1 lymph nodes positive followed by ALND on 10/11/18 with all 7 nodes negative.  Completed radiation  ER/PR/HER2 negative  Pt had fracture of proximal humerus with ORIF on 01/31/2019    Currently in Pain?  Yes    Pain Score  2     Pain Location  Shoulder    Pain Orientation  Right    Pain Descriptors / Indicators  Aching    Pain Type  Surgical pain    Pain Onset  More than a month ago    Pain Frequency  Intermittent         [facility] PT Assessment - 06/19/19 0001      AROM   Right Shoulder Flexion  130 Degrees    Right Shoulder ABduction  145 Degrees   more scaption with pain and pull   Right Shoulder Internal Rotation  45 Degrees    Right Shoulder External Rotation  89 Degrees      Strength   Right Shoulder Flexion  4/5    Right Shoulder ABduction  4/5    Right Shoulder Internal Rotation  4+/5    Right Shoulder External Rotation  4/5                   [facility] Adult PT Treatment/Exercise - 06/19/19 0001      Shoulder Exercises: Supine   Other Supine Exercises  tricep skull crushers 2# 2# 2x10      Shoulder Exercises: Sidelying   External Rotation  Right;10 reps;Weights    External Rotation Weight (lbs)  2    External Rotation Limitations  towel roll; 2 sets    Flexion  Strengthening;Right;10 reps    Flexion Weight (lbs)  1    Flexion Limitations  2 sets    ABduction  Strengthening;Right;10 reps    ABduction Weight (lbs)  1    ABduction Limitations  vcs to move slowly     Other Sidelying Exercises  small  circles with hand to ceiling with both arms       Shoulder Exercises: Standing   Extension  Both;10 reps;Theraband    Theraband Level (Shoulder Extension)  Level 3 (Green)    Extension Limitations  2 sets    Row  Both;10 reps;Theraband    Theraband Level (Shoulder Row)  Level 3 (Green)    Row Limitations  2 sets    Other Standing Exercises  ER walk outs red band as green was too hard 1 step x 10 times    Other Standing Exercises  shoulder taps on elevated table x 10 each side, elevated table pushup x 10                  PT Long Term Goals - 06/19/19 1128      PT LONG TERM GOAL #1   Title  Pt will have 130 degrees of painfree right shoulder flexion so that she will be able to do houshold chores easier    Status  Achieved      PT LONG TERM GOAL #2   Title  Pt will have 130 degrees of active painless right shoulder abduction so that she will be able to perform self care    Baseline  55 on eval, 83 on 04/17/19; 117 degrees - 05/24/19, 121 06/02/19, 145 on 06/19/19    Status  Achieved      PT LONG TERM GOAL #3   Title  Pt will be independent in a HEP for right shoulder ROM and strength    Status  Partially Met      PT LONG TERM GOAL #4   Title  Pt will decrease QDASH to 8%    Status  Achieved            Plan - 06/19/19 1143    Clinical Impression Statement  Pt continues to tolerate Rt shoulder strengthening well with overall continued soreness in the upper arm with reaching and occasionally at rest.  ROM retested today which has greatly improved.  Strength staying similar as last testing with most weakness into flex,abd, and ER    PT Next Visit Plan  Cont Rt shoulder ROM and advance strength, including scapular, as able. Also cont Lt shoulder stretching and axillary MFR PRN/edema management Reveiw MLD as needed for left index finger and lateral chest    PT Home Exercise Plan  Supine dowel exercises and isometrics, rockwoods; bil UE 3 way raises       Patient will  benefit from skilled therapeutic intervention in order to improve the following deficits and impairments:     Visit Diagnosis: Stiffness of right shoulder, not elsewhere classified  Abnormal posture  Acute pain of right shoulder  Muscle weakness (generalized)  Stiffness of left shoulder, not elsewhere classified  Malignant neoplasm of upper-outer quadrant of left breast in female, estrogen receptor negative ([place])     Problem List Patient Active Problem List   Diagnosis Date Noted  . Port-A-Cath in place 05/17/2018  . Genetic testing 05/10/2018  . Family history of melanoma   . Family history of kidney cancer   . Malignant neoplasm of upper-outer quadrant of left breast in female, estrogen receptor negative ([place]) 04/11/2018  . Chest pain 05/13/2015  . Shortness of breath 05/13/2015    [name] 06/19/2019, 11:45 AM  [place], [place], [address] Phone: [phone]   Fax:  [phone]  Name: EVELYNA FOLKER MRN: [id number] Date of Birth: [birth year]

## 2019-06-21 ENCOUNTER — Encounter: Payer: Self-pay | Admitting: Rehabilitation

## 2019-06-21 ENCOUNTER — Other Ambulatory Visit: Payer: Self-pay

## 2019-06-21 ENCOUNTER — Ambulatory Visit: Payer: BC Managed Care – PPO | Admitting: Rehabilitation

## 2019-06-21 DIAGNOSIS — Z171 Estrogen receptor negative status [ER-]: Secondary | ICD-10-CM | POA: Diagnosis not present

## 2019-06-21 DIAGNOSIS — M25611 Stiffness of right shoulder, not elsewhere classified: Secondary | ICD-10-CM | POA: Diagnosis not present

## 2019-06-21 DIAGNOSIS — C50412 Malignant neoplasm of upper-outer quadrant of left female breast: Secondary | ICD-10-CM | POA: Diagnosis not present

## 2019-06-21 DIAGNOSIS — R293 Abnormal posture: Secondary | ICD-10-CM

## 2019-06-21 DIAGNOSIS — M25511 Pain in right shoulder: Secondary | ICD-10-CM

## 2019-06-21 DIAGNOSIS — M25612 Stiffness of left shoulder, not elsewhere classified: Secondary | ICD-10-CM

## 2019-06-21 DIAGNOSIS — M6281 Muscle weakness (generalized): Secondary | ICD-10-CM

## 2019-06-21 NOTE — Therapy (Signed)
[place], [place], [address] Phone: [phone]   Fax:  [phone]  Physical Therapy Treatment  Patient Details  Name: Kathryn [name] MRN: [id number] Date of Birth: 1960/04/02 Referring Provider (PT): Dr. Jana Hakim, [place]    Encounter Date: 06/21/2019  PT End of Session - 06/21/19 1046    Visit Number  24    Number of Visits  26    Date for PT Re-Evaluation  06/29/19    PT Start Time  1009    PT Stop Time  1048    PT Time Calculation (min)  39 min    Activity Tolerance  Patient tolerated treatment well    Behavior During Therapy  [facility] for tasks assessed/performed       Past Medical History:  Diagnosis Date  . Breast cancer ([place])    stage 3 - left  . Family history of kidney cancer   . Family history of melanoma   . Headache    hormone related, none since 1st child was born   . Personal history of chemotherapy   . Personal history of radiation therapy   . Pleurisy     Past Surgical History:  Procedure Laterality Date  . AXILLARY LYMPH NODE DISSECTION Left 10/11/2018   Procedure: LEFT AXILLARY LYMPH NODE DISSECTION;  Surgeon: Erroll Luna, MD;  Location: [place];  Service: General;  Laterality: Left;  . BREAST LUMPECTOMY Left 08/2018  . BREAST LUMPECTOMY WITH RADIOACTIVE SEED AND SENTINEL LYMPH NODE BIOPSY Left 09/23/2018   Procedure: LEFT BREAST LUMPECTOMY WITH RADIOACTIVE SEED AND LEFT AXILLARY TARGETED LYMPH NODE BIOPY AND  LEFT AXILLARY SENTINEL LYMPH NODE MAPPING;  Surgeon: Erroll Luna, MD;  Location: [place];  Service: General;  Laterality: Left;  . CESAREAN SECTION     x3  . ORIF HUMERUS FRACTURE Right 01/31/2019   Procedure: OPEN REDUCTION INTERNAL FIXATION (ORIF) Right 3 part proximal humerus fracture;  Surgeon: Justice Britain, MD;  [facility];  Service: Orthopedics;  Laterality: Right;  152mn  . PORTACATH PLACEMENT N/A 04/28/2018   Procedure: INSERTION  PORT-A-CATH WITH ULTRASOUND;  Surgeon: CErroll Luna MD;  [place]  Service: General;  Laterality: N/A;    There were no vitals filed for this visit.  Subjective Assessment - 06/21/19 1009    Subjective  Its always just a little sore    Pertinent History  Neoadjuvant chemotherapy getting through about 50% before having to stop, s/p lumpectomy on 09/23/18 [facility].  1/1 lymph nodes positive followed by ALND on 10/11/18 with all 7 nodes negative.  Completed radiation  ER/PR/HER2 negative  Pt had fracture of proximal humerus with ORIF on 01/31/2019    Currently in Pain?  Yes    Pain Score  3     Pain Location  Shoulder    Pain Orientation  Right    Pain Descriptors / Indicators  Aching    Pain Type  Surgical pain    Pain Onset  More than a month ago                       [facility] - 06/21/19 0001      Shoulder Exercises: Supine   Protraction  Right    Protraction Weight (lbs)  3    Protraction Limitations  no tcs or vcs needed today       Shoulder Exercises: Sidelying   External Rotation  Right;10  reps;Weights    External Rotation Weight (lbs)  2    External Rotation Limitations  towel roll; 2 sets; vcs to stop wrist extension help      Shoulder Exercises: Standing   External Rotation  Right;10 reps    Theraband Level (Shoulder External Rotation)  Level 3 (Green)    External Rotation Limitations  with towel roll  under the arm    Internal Rotation  Left;10 reps    Theraband Level (Shoulder Internal Rotation)  Level 3 (Green)    Internal Rotation Limitations  with towel roll under the arm    Flexion  Right;10 reps    Shoulder Flexion Weight (lbs)  2    Flexion Limitations  2 sets    ABduction  Right;10 reps    ABduction Limitations  2 sets    Extension  Left;10 reps    Theraband Level (Shoulder Extension)  Level 3 (Green)    Extension Limitations  2 sets    Row  Both;10 reps;Theraband    Theraband Level (Shoulder Row)   Level 3 (Green)    Row Limitations  2 sets    Other Standing Exercises  ER walk outs better with green band today 1 step x 10 times      Shoulder Exercises: Pulleys   Flexion  2 minutes    Flexion Limitations  warm up    ABduction  2 minutes    ABduction Limitations  warm up      Manual Therapy   Passive ROM  to the Rt shoulder into flexion, abduction, ER, and IR at various angles of abduction                  PT Long Term Goals - 06/19/19 1128      PT LONG TERM GOAL #1   Title  Pt will have 130 degrees of painfree right shoulder flexion so that she will be able to do houshold chores easier    Status  Achieved      PT LONG TERM GOAL #2   Title  Pt will have 130 degrees of active painless right shoulder abduction so that she will be able to perform self care    Baseline  55 on eval, 83 on 04/17/19; 117 degrees - 05/24/19, 121 06/02/19, 145 on 06/19/19    Status  Achieved      PT LONG TERM GOAL #3   Title  Pt will be independent in a HEP for right shoulder ROM and strength    Status  Partially Met      PT LONG TERM GOAL #4   Title  Pt will decrease QDASH to 8%    Status  Achieved            Plan - 06/21/19 1056    Clinical Impression Statement  Pt continues to tolerate all shoulder strengthening well with PROM improving into all directions and with less ER pain.  Pt has one more week    PT Next Visit Plan  1 more week in POC; checkgive final HEP even if just strength ABC for both sides    Consulted and Agree with Plan of Care  Patient       Patient will benefit from skilled therapeutic intervention in order to improve the following deficits and impairments:     Visit Diagnosis: Stiffness of right shoulder, not elsewhere classified  Abnormal posture  Acute pain of right shoulder  Muscle weakness (generalized)  Stiffness of left shoulder, not  elsewhere classified  Malignant neoplasm of upper-outer quadrant of left breast in female, estrogen receptor  negative [facility])     Problem List Patient Active Problem List   Diagnosis Date Noted  . Port-A-Cath in place 05/17/2018  . Genetic testing 05/10/2018  . Family history of melanoma   . Family history of kidney cancer   . Malignant neoplasm of upper-outer quadrant of left breast in female, estrogen receptor negative ([place]) 04/11/2018  . Chest pain 05/13/2015  . Shortness of breath 05/13/2015    [name] 06/21/2019, 10:59 AM  [place], [place], [address] Phone: [phone]   Fax:  [phone]  Name: Kathryn [name] MRN: [id number] Date of Birth: 02-04-1961

## 2019-06-26 ENCOUNTER — Encounter: Payer: Self-pay | Admitting: Oncology

## 2019-06-27 ENCOUNTER — Ambulatory Visit: Payer: BC Managed Care – PPO | Attending: Oncology

## 2019-06-27 ENCOUNTER — Other Ambulatory Visit: Payer: Self-pay

## 2019-06-27 ENCOUNTER — Other Ambulatory Visit: Payer: Self-pay | Admitting: Oncology

## 2019-06-27 DIAGNOSIS — M25611 Stiffness of right shoulder, not elsewhere classified: Secondary | ICD-10-CM | POA: Insufficient documentation

## 2019-06-27 DIAGNOSIS — M25511 Pain in right shoulder: Secondary | ICD-10-CM | POA: Insufficient documentation

## 2019-06-27 DIAGNOSIS — R293 Abnormal posture: Secondary | ICD-10-CM | POA: Diagnosis not present

## 2019-06-27 DIAGNOSIS — Z171 Estrogen receptor negative status [ER-]: Secondary | ICD-10-CM | POA: Diagnosis not present

## 2019-06-27 DIAGNOSIS — M6281 Muscle weakness (generalized): Secondary | ICD-10-CM | POA: Diagnosis not present

## 2019-06-27 DIAGNOSIS — C50412 Malignant neoplasm of upper-outer quadrant of left female breast: Secondary | ICD-10-CM | POA: Diagnosis not present

## 2019-06-27 DIAGNOSIS — M25612 Stiffness of left shoulder, not elsewhere classified: Secondary | ICD-10-CM | POA: Diagnosis not present

## 2019-06-27 NOTE — Therapy (Signed)
[place], [place], [address] Phone: [phone]   Fax:  [phone]  Physical Therapy Treatment  Patient Details  Name: Kathryn [name] MRN: [id number] Date of Birth: November 02, 1960 Referring Provider (PT): Dr. Jana Hakim, [place]    Encounter Date: 06/27/2019  PT End of Session - 06/27/19 1002    Visit Number  25    Number of Visits  26    Date for PT Re-Evaluation  06/29/19    PT Start Time  0907    PT Stop Time  1001    PT Time Calculation (min)  54 min    Activity Tolerance  Patient tolerated treatment well    Behavior During Therapy  [facility] for tasks assessed/performed       Past Medical History:  Diagnosis Date  . Breast cancer ([place])    stage 3 - left  . Family history of kidney cancer   . Family history of melanoma   . Headache    hormone related, none since 1st child was born   . Personal history of chemotherapy   . Personal history of radiation therapy   . Pleurisy     Past Surgical History:  Procedure Laterality Date  . AXILLARY LYMPH NODE DISSECTION Left 10/11/2018   Procedure: LEFT AXILLARY LYMPH NODE DISSECTION;  Surgeon: Erroll Luna, MD;  Location: [place];  Service: General;  Laterality: Left;  . BREAST LUMPECTOMY Left 08/2018  . BREAST LUMPECTOMY WITH RADIOACTIVE SEED AND SENTINEL LYMPH NODE BIOPSY Left 09/23/2018   Procedure: LEFT BREAST LUMPECTOMY WITH RADIOACTIVE SEED AND LEFT AXILLARY TARGETED LYMPH NODE BIOPY AND  LEFT AXILLARY SENTINEL LYMPH NODE MAPPING;  Surgeon: Erroll Luna, MD;  Location: [place];  Service: General;  Laterality: Left;  . CESAREAN SECTION     x3  . ORIF HUMERUS FRACTURE Right 01/31/2019   Procedure: OPEN REDUCTION INTERNAL FIXATION (ORIF) Right 3 part proximal humerus fracture;  Surgeon: Justice Britain, MD;  [facility];  Service: Orthopedics;  Laterality: Right;  173mn  . PORTACATH PLACEMENT N/A 04/28/2018   Procedure: INSERTION  PORT-A-CATH WITH ULTRASOUND;  Surgeon: CErroll Luna MD;  [place]  Service: General;  Laterality: N/A;    There were no vitals filed for this visit.  Subjective Assessment - 06/27/19 0908    Subjective  I have a sore spot in the front of my shoulder today but I think the general soreness is a little better, but not much.    Pertinent History  Neoadjuvant chemotherapy getting through about 50% before having to stop, s/p [facility]pect[facility]23/[facility].  1/1 lymph nodes positive followed by ALND on 10/11/18 with all 7 nodes negative.  Completed radiation  ER/PR/HER2 negative  Pt had fracture of proximal humerus with ORIF on 01/31/2019    Patient Stated Goals  to get right arm better    Currently in Pain?  No/denies   just sore                      [facility] Adult PT Treatment/Exercise - 06/27/19 0001      Shoulder Exercises: Supine   Protraction  Strengthening;Right;20 reps;Weights    Protraction Weight (lbs)  3    Other Supine Exercises  Rt Tricep extension 2# 2x10      Shoulder Exercises: Sidelying   External Rotation  Right;10 reps    External Rotation Weight (lbs)  2    External Rotation Limitations  towel roll; 2 sets; vcs to stop wrist extension help    Flexion  Strengthening;Right;10 reps;Weights    Flexion Weight (lbs)  2    Flexion Limitations  Tactile cues for correct scapular rhythm    ABduction  Strengthening;Right;10 reps    ABduction Weight (lbs)  2    Other Sidelying Exercises  small circles with Rt hand to ceiling with 2#        Manual Therapy   Soft tissue mobilization  With Biotone to Rt upper arm at bicipital tendon briefly at end of session after P/ROM    Passive ROM  to the Rt shoulder into flexion, abduction, ER, and IR at various angles of abduction                  PT Long Term Goals - 06/19/19 1128      PT LONG TERM GOAL #1   Title  Pt will have 130 degrees of painfree right shoulder flexion so that she will be  able to do houshold chores easier    Status  Achieved      PT LONG TERM GOAL #2   Title  Pt will have 130 degrees of active painless right shoulder abduction so that she will be able to perform self care    Baseline  55 on eval, 83 on 04/17/19; 117 degrees - 05/24/19, 121 06/02/19, 145 on 06/19/19    Status  Achieved      PT LONG TERM GOAL #3   Title  Pt will be independent in a HEP for right shoulder ROM and strength    Status  Partially Met      PT LONG TERM GOAL #4   Title  Pt will decrease QDASH to 8%    Status  Achieved            Plan - 06/27/19 1002    Clinical Impression Statement  Pt has follow up with her orthopedist this week. She continues to tolerate strengthening and end ROM stretching well. Pt has 2 more appointments with physical therapy before D/C.    Personal Factors and Comorbidities  Comorbidity 2    Comorbidities  lymph node removal, radiation    Examination-Activity Limitations  Reach Overhead;Dressing;Hygiene/Grooming    Examination-Participation Restrictions  Yard Work;Cleaning;Laundry;Meal Prep    Stability/Clinical Decision Making  Stable/Uncomplicated    Rehab Potential  Good    PT Frequency  2x / week    PT Duration  4 weeks    PT Treatment/Interventions  ADLs/Self Care Home Management;Manual lymph drainage;Patient/family education;Therapeutic exercise;Therapeutic activities;Passive range of motion;Scar mobilization;Manual techniques;Neuromuscular re-education;Taping    PT Next Visit Plan  Instruct in Strength ABC program (issued packet today so pt can see if she has this at home), remeasure A/ROM of Rt shoulder and plan to D/C next week    PT Home Exercise Plan  Supine dowel exercises and isometrics, rockwoods; bil UE 3 way raises    Consulted and Agree with Plan of Care  Patient       Patient will benefit from skilled therapeutic intervention in order to improve the following deficits and impairments:  Decreased mobility, Impaired perceived functional  ability, Decreased range of motion, Decreased activity tolerance, Decreased coordination, Decreased knowledge of use of DME, Decreased strength, Increased fascial restricitons, Impaired flexibility, Impaired UE functional use, Postural dysfunction, Pain  Visit Diagnosis: Stiffness of right shoulder, not elsewhere classified  Abnormal posture  Acute pain of right shoulder  Muscle weakness (generalized)  Stiffness of   left shoulder, not elsewhere classified  Malignant neoplasm of upper-outer quadrant of left breast in female, estrogen receptor negative [facility])     Problem List Patient Active Problem List   Diagnosis Date Noted  . Port-A-Cath in place 05/17/2018  . Genetic testing 05/10/2018  . Family history of melanoma   . Family history of kidney cancer   . Malignant neoplasm of upper-outer quadrant of left breast in female, estrogen receptor negative ([place]) 04/11/2018  . Chest pain 05/13/2015  . Shortness of breath 05/13/2015    Otelia Limes, PTA 06/27/2019, 12:40 PM  [place], [place], [address] Phone: [phone]   Fax:  [phone]  Name: TAILYNN ARMETTA MRN: [id number] Date of Birth: 03/04/1960

## 2019-06-28 DIAGNOSIS — S42201D Unspecified fracture of upper end of right humerus, subsequent encounter for fracture with routine healing: Secondary | ICD-10-CM | POA: Diagnosis not present

## 2019-06-29 ENCOUNTER — Ambulatory Visit: Payer: BC Managed Care – PPO
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
## 2019-06-30 ENCOUNTER — Other Ambulatory Visit: Payer: Self-pay

## 2019-06-30 ENCOUNTER — Other Ambulatory Visit: Payer: BC Managed Care – PPO

## 2019-06-30 ENCOUNTER — Inpatient Hospital Stay: Payer: BC Managed Care – PPO

## 2019-06-30 ENCOUNTER — Inpatient Hospital Stay: Payer: BC Managed Care – PPO | Attending: Oncology | Admitting: Oncology

## 2019-06-30 VITALS — BP 127/89 | HR 93 | Temp 97.8°F | Resp 17 | Ht 64.0 in | Wt 154.5 lb

## 2019-06-30 DIAGNOSIS — M810 Age-related osteoporosis without current pathological fracture: Secondary | ICD-10-CM | POA: Diagnosis not present

## 2019-06-30 DIAGNOSIS — C50412 Malignant neoplasm of upper-outer quadrant of left female breast: Secondary | ICD-10-CM | POA: Insufficient documentation

## 2019-06-30 DIAGNOSIS — Z791 Long term (current) use of non-steroidal anti-inflammatories (NSAID): Secondary | ICD-10-CM | POA: Insufficient documentation

## 2019-06-30 DIAGNOSIS — Z95828 Presence of other vascular implants and grafts: Secondary | ICD-10-CM

## 2019-06-30 DIAGNOSIS — Z9221 Personal history of antineoplastic chemotherapy: Secondary | ICD-10-CM | POA: Insufficient documentation

## 2019-06-30 DIAGNOSIS — Z923 Personal history of irradiation: Secondary | ICD-10-CM | POA: Diagnosis not present

## 2019-06-30 DIAGNOSIS — Z171 Estrogen receptor negative status [ER-]: Secondary | ICD-10-CM | POA: Insufficient documentation

## 2019-06-30 DIAGNOSIS — Z79899 Other long term (current) drug therapy: Secondary | ICD-10-CM | POA: Diagnosis not present

## 2019-06-30 LAB — COMPREHENSIVE METABOLIC PANEL
ALT: 15 U/L (ref 0–44)
AST: 16 U/L (ref 15–41)
Albumin: 3.8 g/dL (ref 3.5–5.0)
Alkaline Phosphatase: 110 U/L (ref 38–126)
Anion gap: 10 (ref 5–15)
BUN: 13 mg/dL (ref 6–20)
CO2: 22 mmol/L (ref 22–32)
Calcium: 9 mg/dL (ref 8.9–10.3)
Chloride: 108 mmol/L (ref 98–111)
Creatinine, Ser: 0.78 mg/dL (ref 0.44–1.00)
GFR calc Af Amer: >60 mL/min (ref >60)
GFR calc non Af Amer: >60 mL/min (ref >60)
Glucose, Bld: 80 mg/dL (ref 70–99)
Potassium: 4 mmol/L (ref 3.5–5.1)
Sodium: 140 mmol/L (ref 135–145)
Total Bilirubin: 0.5 mg/dL (ref 0.3–1.2)
Total Protein: 6.7 g/dL (ref 6.5–8.1)

## 2019-06-30 LAB — CBC WITH DIFFERENTIAL/PLATELET
Abs Immature Granulocytes: 0.02 10*3/uL (ref 0.00–0.07)
Basophils Absolute: 0 10*3/uL (ref 0.0–0.1)
Basophils Relative: 1 %
Eosinophils Absolute: 0.1 10*3/uL (ref 0.0–0.5)
Eosinophils Relative: 2 %
HCT: 33.9 % — ABNORMAL LOW (ref 36.0–46.0)
Hemoglobin: 11.5 g/dL — ABNORMAL LOW (ref 12.0–15.0)
Immature Granulocytes: 1 %
Lymphocytes Relative: 28 %
Lymphs Abs: 1 10*3/uL (ref 0.7–4.0)
MCH: 37.5 pg — ABNORMAL HIGH (ref 26.0–34.0)
MCHC: 33.9 g/dL (ref 30.0–36.0)
MCV: 110.4 fL — ABNORMAL HIGH (ref 80.0–100.0)
Monocytes Absolute: 0.5 10*3/uL (ref 0.1–1.0)
Monocytes Relative: 14 %
Neutro Abs: 2 10*3/uL (ref 1.7–7.7)
Neutrophils Relative %: 54 %
Platelets: 178 10*3/uL (ref 150–400)
RBC: 3.07 MIL/uL — ABNORMAL LOW (ref 3.87–5.11)
RDW: 17 % — ABNORMAL HIGH (ref 11.5–15.5)
WBC: 3.6 10*3/uL — ABNORMAL LOW (ref 4.0–10.5)
nRBC: 0 % (ref 0.0–0.2)

## 2019-06-30 MED ORDER — SODIUM CHLORIDE 0.9 % IV SOLN
INTRAVENOUS | Status: DC
  Filled 2019-06-30: qty 250

## 2019-06-30 MED ORDER — ZOLEDRONIC ACID 4 MG/100ML IV SOLN
INTRAVENOUS | Status: AC
  Filled 2019-06-30: qty 100

## 2019-06-30 MED ORDER — HEPARIN SOD (PORK) LOCK FLUSH 100 UNIT/ML IV SOLN
500.0000 [IU] | Freq: Once | INTRAVENOUS | Status: AC
  Administered 2019-06-30: 10:00:00 500 [IU] via INTRAVENOUS
  Filled 2019-06-30: qty 5

## 2019-06-30 MED ORDER — SODIUM CHLORIDE 0.9% FLUSH
10.0000 mL | Freq: Once | INTRAVENOUS | Status: AC
  Administered 2019-06-30: 10 mL via INTRAVENOUS
  Filled 2019-06-30: qty 10

## 2019-06-30 MED ORDER — ZOLEDRONIC ACID 4 MG/100ML IV SOLN
4.0000 mg | Freq: Once | INTRAVENOUS | Status: AC
  Administered 2019-06-30: 4 mg via INTRAVENOUS

## 2019-06-30 NOTE — Patient Instructions (Signed)
Zoledronic Acid injection (Hypercalcemia, Oncology) What is this medicine? ZOLEDRONIC ACID (ZOE le dron ik AS id) lowers the amount of calcium loss from bone. It is used to treat too much calcium in your blood from cancer. It is also used to prevent complications of cancer that has spread to the bone. This medicine may be used for other purposes; ask your health care provider or pharmacist if you have questions. COMMON BRAND NAME(S): Zometa What should I tell my health care provider before I take this medicine? They need to know if you have any of these conditions:  aspirin-sensitive asthma  cancer, especially if you are receiving medicines used to treat cancer  dental disease or wear dentures  infection  kidney disease  receiving corticosteroids like dexamethasone or prednisone  an unusual or allergic reaction to zoledronic acid, other medicines, foods, dyes, or preservatives  pregnant or trying to get pregnant  breast-feeding How should I use this medicine? This medicine is for infusion into a vein. It is given by a health care professional in a hospital or clinic setting. Talk to your pediatrician regarding the use of this medicine in children. Special care may be needed. Overdosage: If you think you have taken too much of this medicine contact a poison control center or emergency room at once. NOTE: This medicine is only for you. Do not share this medicine with others. What if I miss a dose? It is important not to miss your dose. Call your doctor or health care professional if you are unable to keep an appointment. What may interact with this medicine?  certain antibiotics given by injection  NSAIDs, medicines for pain and inflammation, like ibuprofen or naproxen  some diuretics like bumetanide, furosemide  teriparatide  thalidomide This list may not describe all possible interactions. Give your health care provider a list of all the medicines, herbs, non-prescription  drugs, or dietary supplements you use. Also tell them if you smoke, drink alcohol, or use illegal drugs. Some items may interact with your medicine. What should I watch for while using this medicine? Visit your doctor or health care professional for regular checkups. It may be some time before you see the benefit from this medicine. Do not stop taking your medicine unless your doctor tells you to. Your doctor may order blood tests or other tests to see how you are doing. Women should inform their doctor if they wish to become pregnant or think they might be pregnant. There is a potential for serious side effects to an unborn child. Talk to your health care professional or pharmacist for more information. You should make sure that you get enough calcium and vitamin D while you are taking this medicine. Discuss the foods you eat and the vitamins you take with your health care professional. Some people who take this medicine have severe bone, joint, and/or muscle pain. This medicine may also increase your risk for jaw problems or a broken thigh bone. Tell your doctor right away if you have severe pain in your jaw, bones, joints, or muscles. Tell your doctor if you have any pain that does not go away or that gets worse. Tell your dentist and dental surgeon that you are taking this medicine. You should not have major dental surgery while on this medicine. See your dentist to have a dental exam and fix any dental problems before starting this medicine. Take good care of your teeth while on this medicine. Make sure you see your dentist for regular follow-up   appointments. What side effects may I notice from receiving this medicine? Side effects that you should report to your doctor or health care professional as soon as possible:  allergic reactions like skin rash, itching or hives, swelling of the face, lips, or tongue  anxiety, confusion, or depression  breathing problems  changes in vision  eye  pain  feeling faint or lightheaded, falls  jaw pain, especially after dental work  mouth sores  muscle cramps, stiffness, or weakness  redness, blistering, peeling or loosening of the skin, including inside the mouth  trouble passing urine or change in the amount of urine Side effects that usually do not require medical attention (report to your doctor or health care professional if they continue or are bothersome):  bone, joint, or muscle pain  constipation  diarrhea  fever  hair loss  irritation at site where injected  loss of appetite  nausea, vomiting  stomach upset  trouble sleeping  trouble swallowing  weak or tired This list may not describe all possible side effects. Call your doctor for medical advice about side effects. You may report side effects to FDA at 1-800-FDA-1088. Where should I keep my medicine? This drug is given in a hospital or clinic and will not be stored at home. NOTE: This sheet is a summary. It may not cover all possible information. If you have questions about this medicine, talk to your doctor, pharmacist, or health care provider.  2020 Elsevier/Gold Standard (2013-07-08 14:19:39)  

## 2019-06-30 NOTE — Addendum Note (Signed)
Addended by: Chauncey Cruel on: 06/30/2019 09:16 AM   Modules accepted: Orders

## 2019-07-03 ENCOUNTER — Telehealth: Payer: Self-pay | Admitting: Oncology

## 2019-07-03 NOTE — Telephone Encounter (Signed)
No 5/7 los. No changes made to pt's schedule.

## 2019-07-04 ENCOUNTER — Other Ambulatory Visit: Payer: Self-pay

## 2019-07-04 ENCOUNTER — Encounter: Payer: Self-pay | Admitting: Physical Therapy

## 2019-07-04 ENCOUNTER — Ambulatory Visit: Payer: BC Managed Care – PPO | Admitting: Physical Therapy

## 2019-07-04 DIAGNOSIS — M25611 Stiffness of right shoulder, not elsewhere classified: Secondary | ICD-10-CM | POA: Diagnosis not present

## 2019-07-04 DIAGNOSIS — R293 Abnormal posture: Secondary | ICD-10-CM | POA: Diagnosis not present

## 2019-07-04 DIAGNOSIS — M6281 Muscle weakness (generalized): Secondary | ICD-10-CM | POA: Diagnosis not present

## 2019-07-04 DIAGNOSIS — M25612 Stiffness of left shoulder, not elsewhere classified: Secondary | ICD-10-CM

## 2019-07-04 DIAGNOSIS — M25511 Pain in right shoulder: Secondary | ICD-10-CM | POA: Diagnosis not present

## 2019-07-04 DIAGNOSIS — Z171 Estrogen receptor negative status [ER-]: Secondary | ICD-10-CM | POA: Diagnosis not present

## 2019-07-04 DIAGNOSIS — C50412 Malignant neoplasm of upper-outer quadrant of left female breast: Secondary | ICD-10-CM | POA: Diagnosis not present

## 2019-07-04 NOTE — Therapy (Signed)
[place], [place], [address] Phone: [phone]   Fax:  [phone]  Physical Therapy Treatment  Patient Details  Name: Kathryn [name] MRN: [id number] Date of Birth: 10/12/1960 Referring Provider (PT): Dr. Jana Hakim, [place]    Encounter Date: 07/04/2019  PT End of Session - 07/04/19 1357    Visit Number  26    Number of Visits  26    Date for PT Re-Evaluation  06/29/19    PT Start Time  3016    PT Stop Time  1350    PT Time Calculation (min)  45 min    Activity Tolerance  Patient tolerated treatment well    Behavior During Therapy  [facility] for tasks assessed/performed       Past Medical History:  Diagnosis Date  . Breast cancer ([place])    stage 3 - left  . Family history of kidney cancer   . Family history of melanoma   . Headache    hormone related, none since 1st child was born   . Personal history of chemotherapy   . Personal history of radiation therapy   . Pleurisy     Past Surgical History:  Procedure Laterality Date  . AXILLARY LYMPH NODE DISSECTION Left 10/11/2018   Procedure: LEFT AXILLARY LYMPH NODE DISSECTION;  Surgeon: Erroll Luna, MD;  Location: [place];  Service: General;  Laterality: Left;  . BREAST LUMPECTOMY Left 08/2018  . BREAST LUMPECTOMY WITH RADIOACTIVE SEED AND SENTINEL LYMPH NODE BIOPSY Left 09/23/2018   Procedure: LEFT BREAST LUMPECTOMY WITH RADIOACTIVE SEED AND LEFT AXILLARY TARGETED LYMPH NODE BIOPY AND  LEFT AXILLARY SENTINEL LYMPH NODE MAPPING;  Surgeon: Erroll Luna, MD;  Location: [place];  Service: General;  Laterality: Left;  . CESAREAN SECTION     x3  . ORIF HUMERUS FRACTURE Right 01/31/2019   Procedure: OPEN REDUCTION INTERNAL FIXATION (ORIF) Right 3 part proximal humerus fracture;  Surgeon: Justice Britain, MD;  Location: [facility];  Service: Orthopedics;  Laterality: Right;  14mn  . PORTACATH PLACEMENT N/A 04/28/2018   Procedure: INSERTION  PORT-A-CATH WITH ULTRASOUND;  Surgeon: CErroll Luna MD;  [place]  Service: General;  Laterality: N/A;    There were no vitals filed for this visit.  Subjective Assessment - 07/04/19 1311    Subjective  Pt is doing well.  She had a family wedding this weekend and used her arm quite a bit and so her arm is a little sore. Dr. SOnnie Grahamreleased her last week.  She feels like she is ready to discharge from PT .She will get measured for her compression glove on June 4    Pertinent History  Neoadjuvant chemotherapy getting through about 50% before having to stop, s/p [facility]pect[facility].  1/1 lymph nodes positive followed by [facility] on 10/11/18 with all 7 nodes negative.  Completed radiation  ER/PR/HER2 negative  Pt had fracture of proximal humerus with ORIF on 01/31/2019    Patient Stated Goals  to get right arm better    Currently in Pain?  No/denies   only sore when she stretches too far.        [facility] 07/[facility] 0001      Observation/Other Assessments   Other Surveys   QKatina Dung   Quick DASH   13.64           QKatina Dung- 07/04/19 0001    Open a tight  or new jar  Mild difficulty    Do heavy household chores (wash walls, wash floors)  Mild difficulty    Carry a shopping bag or briefcase  Mild difficulty    Wash your back  Moderate difficulty    Use a knife to cut food  No difficulty    Recreational activities in which you take some force or impact through your arm, shoulder, or hand (golf, hammering, tennis)  Mild difficulty    During the past week, to what extent has your arm, shoulder or hand problem interfered with your normal social activities with family, friends, neighbors, or groups?  Not at all    Arm, shoulder, or hand pain.  None    Tingling (pins and needles) in your arm, shoulder, or hand  Mild    Difficulty Sleeping  No difficulty    DASH Score  13.64 %             [facility] Adult PT Treatment/Exercise - 07/04/19 0001       Exercises   Exercises  Other Exercises    Other Exercises   Reviewed with demonstration and pt practicing all exercises in the Strength ABC program.  Pt was able to do them all with good form and understands exercise progression.  She is motivated to do them to get more general body strength              PT Education - 07/04/19 1356    Education Details  strength ABC program    Person(s) Educated  Patient    Methods  Explanation;Demonstration;Handout    Comprehension  Verbalized understanding;Returned demonstration          PT Long Term Goals - 07/04/19 1346      PT LONG TERM GOAL #1   Title  Pt will have 130 degrees of painfree right shoulder flexion so that she will be able to do houshold chores easier    Baseline  78 on eval, 122 on 04/17/19; 133 degrees - 05/24/19, 135 on 06/02/19    Status  Achieved      PT LONG TERM GOAL #2   Title  Pt will have 130 degrees of active painless right shoulder abduction so that she will be able to perform self care    Baseline  55 on eval, 83 on 04/17/19; 117 degrees - 05/24/19, 121 06/02/19, 145 on 06/19/19    Status  Achieved      PT LONG TERM GOAL #3   Title  Pt will be independent in a HEP for right shoulder ROM and strength    Status  Achieved      PT LONG TERM GOAL #4   Title  Pt will decrease QDASH to 8%    Baseline  18.18 on 11/24/18; 4.55 on 12/20/18, 13.64 on 07/04/2019 due to healing right humeral fracture    Time  4    Period  Weeks    Status  Not Met            Plan - 07/04/19 1357    Clinical Impression Statement  Pt is doing  very well and feels she is ready to discontinue PT. She still has some soreness in her right shoulder and expects to for about a year after surgery. She knows how to start low and gradually progress the strernght in her whole body to inrease general strength and decrase the risk of lymphedema,  She wil be measured for a compression glove and knows self MLD  and use of compression should she feel  some fullness in her left arm. She is ready to discharge from PT    Personal Factors and Comorbidities  Comorbidity 2    Comorbidities  lymph node removal, radiation    Examination-Activity Limitations  Reach Overhead;Dressing;Hygiene/Grooming    PT Next Visit Plan  Discharge       Patient will benefit from skilled therapeutic intervention in order to improve the following deficits and impairments:  Decreased mobility, Impaired perceived functional ability, Decreased range of motion, Decreased activity tolerance, Decreased coordination, Decreased knowledge of use of DME, Decreased strength, Increased fascial restricitons, Impaired flexibility, Impaired UE functional use, Postural dysfunction, Pain  Visit Diagnosis: Stiffness of right shoulder, not elsewhere classified  Abnormal posture  Acute pain of right shoulder  Muscle weakness (generalized)  Stiffness of left shoulder, not elsewhere classified     Problem List Patient Active Problem List   Diagnosis Date Noted  . Port-A-Cath in place 05/17/2018  . Genetic testing 05/10/2018  . Family history of melanoma   . Family history of kidney cancer   . Malignant neoplasm of upper-outer quadrant of left breast in female, estrogen receptor negative ([place]) 04/11/2018  . Chest pain 05/13/2015  . Shortness of breath 05/13/2015       PHYSICAL THERAPY DISCHARGE SUMMARY  Visits from Start of Care: 26  Current functional level related to goals / functional outcomes: Pt is independent in self management    Remaining deficits: Soreness in right shoulder    Education / Equipment: Home exercise, self MLD, use of compression  Plan: Patient agrees to discharge.  Patient goals were partially met. Patient is being discharged due to being pleased with the current functional level.  ?????        Donato Heinz. Owens Shark PT  Norwood Levo 07/04/2019, 2:02 PM  [place], [place], [address] Phone: [phone]   Fax:  [phone]  Name: Kathryn [name] MRN: [id number] Date of Birth: 1960-05-02

## 2019-07-05 ENCOUNTER — Encounter: Payer: Self-pay | Admitting: Oncology

## 2019-07-06 ENCOUNTER — Encounter: Payer: Self-pay | Admitting: *Deleted

## 2019-07-11 ENCOUNTER — Encounter (HOSPITAL_BASED_OUTPATIENT_CLINIC_OR_DEPARTMENT_OTHER): Payer: Self-pay | Admitting: Surgery

## 2019-07-11 ENCOUNTER — Other Ambulatory Visit: Payer: Self-pay

## 2019-07-14 ENCOUNTER — Other Ambulatory Visit (HOSPITAL_COMMUNITY)
Admission: RE | Admit: 2019-07-14 | Discharge: 2019-07-14 | Disposition: A | Discharge status: Home / Self Care | Payer: BC Managed Care – PPO | Source: Ambulatory Visit | Attending: Surgery | Admitting: Surgery

## 2019-07-14 DIAGNOSIS — Z20822 Contact with and (suspected) exposure to covid-19: Secondary | ICD-10-CM | POA: Diagnosis not present

## 2019-07-14 DIAGNOSIS — Z01812 Encounter for preprocedural laboratory examination: Secondary | ICD-10-CM | POA: Insufficient documentation

## 2019-07-14 NOTE — Progress Notes (Signed)

## 2019-07-15 LAB — SARS CORONAVIRUS 2 (TAT 6-24 HRS): SARS Coronavirus 2: NEGATIVE

## 2019-07-17 NOTE — Anesthesia Preprocedure Evaluation (Addendum)
Anesthesia Evaluation  Patient identified by MRN, date of birth, ID band Patient awake  General Assessment Comment:History noted CG  Reviewed: Allergy & Precautions, NPO status   Airway Mallampati: II  TM Distance: >3 FB     Dental   Pulmonary    breath sounds clear to auscultation       Cardiovascular negative cardio ROS   Rhythm:Regular Rate:Normal     Neuro/Psych    GI/Hepatic negative GI ROS, Neg liver ROS,   Endo/Other  negative endocrine ROS  Renal/GU negative Renal ROS     Musculoskeletal   Abdominal   Peds  Hematology   Anesthesia Other Findings   Reproductive/Obstetrics                             Anesthesia Physical Anesthesia Plan  ASA: III  Anesthesia Plan: MAC   Post-op Pain Management:    Induction:   PONV Risk Score and Plan: 3 and Ondansetron, Dexamethasone and Midazolam  Airway Management Planned: Simple Face Mask and Nasal Cannula  Additional Equipment:   Intra-op Plan:   Post-operative Plan:   Informed Consent: I have reviewed the patients History and Physical, chart, labs and discussed the procedure including the risks, benefits and alternatives for the proposed anesthesia with the patient or authorized representative who has indicated his/her understanding and acceptance.     Dental advisory given  Plan Discussed with: CRNA and Anesthesiologist  Anesthesia Plan Comments:        Anesthesia Quick Evaluation

## 2019-07-18 ENCOUNTER — Ambulatory Visit (HOSPITAL_BASED_OUTPATIENT_CLINIC_OR_DEPARTMENT_OTHER): Payer: BC Managed Care – PPO | Admitting: Anesthesiology

## 2019-07-18 ENCOUNTER — Other Ambulatory Visit: Payer: Self-pay

## 2019-07-18 ENCOUNTER — Encounter (HOSPITAL_BASED_OUTPATIENT_CLINIC_OR_DEPARTMENT_OTHER)
Admission: RE | Disposition: A | Discharge status: Home / Self Care | Payer: Self-pay | Source: Home / Self Care | Attending: Surgery

## 2019-07-18 ENCOUNTER — Ambulatory Visit (HOSPITAL_BASED_OUTPATIENT_CLINIC_OR_DEPARTMENT_OTHER)
Admission: RE | Admit: 2019-07-18 | Discharge: 2019-07-18 | Disposition: A | Discharge status: Home / Self Care | Payer: BC Managed Care – PPO | Attending: Surgery | Admitting: Surgery

## 2019-07-18 ENCOUNTER — Encounter (HOSPITAL_BASED_OUTPATIENT_CLINIC_OR_DEPARTMENT_OTHER): Payer: Self-pay | Admitting: Surgery

## 2019-07-18 DIAGNOSIS — Z808 Family history of malignant neoplasm of other organs or systems: Secondary | ICD-10-CM | POA: Diagnosis not present

## 2019-07-18 DIAGNOSIS — Z79899 Other long term (current) drug therapy: Secondary | ICD-10-CM | POA: Diagnosis not present

## 2019-07-18 DIAGNOSIS — Z9221 Personal history of antineoplastic chemotherapy: Secondary | ICD-10-CM | POA: Diagnosis not present

## 2019-07-18 DIAGNOSIS — Z853 Personal history of malignant neoplasm of breast: Secondary | ICD-10-CM | POA: Insufficient documentation

## 2019-07-18 DIAGNOSIS — Z452 Encounter for adjustment and management of vascular access device: Secondary | ICD-10-CM | POA: Insufficient documentation

## 2019-07-18 HISTORY — PX: PORT-A-CATH REMOVAL: SHX5289

## 2019-07-18 SURGERY — REMOVAL PORT-A-CATH
Anesthesia: Monitor Anesthesia Care | Site: Chest | Laterality: Right

## 2019-07-18 MED ORDER — HEPARIN (PORCINE) IN NACL 1000-0.9 UT/500ML-% IV SOLN
INTRAVENOUS | Status: AC
  Filled 2019-07-18: qty 500

## 2019-07-18 MED ORDER — IBUPROFEN 800 MG PO TABS: 800.0000 mg | ORAL_TABLET | Freq: Three times a day (TID) | ORAL | 0 refills | Status: DC | PRN

## 2019-07-18 MED ORDER — SODIUM CHLORIDE (PF) 0.9 % IJ SOLN
INTRAMUSCULAR | Status: AC
  Filled 2019-07-18: qty 10

## 2019-07-18 MED ORDER — HEPARIN SOD (PORK) LOCK FLUSH 100 UNIT/ML IV SOLN
INTRAVENOUS | Status: AC
  Filled 2019-07-18: qty 5

## 2019-07-18 MED ORDER — CEFAZOLIN SODIUM-DEXTROSE 2-4 GM/100ML-% IV SOLN
2.0000 g | INTRAVENOUS | Status: AC
  Administered 2019-07-18: 2 g via INTRAVENOUS

## 2019-07-18 MED ORDER — CHLORHEXIDINE GLUCONATE CLOTH 2 % EX PADS: 6.0000 | MEDICATED_PAD | Freq: Once | CUTANEOUS | Status: DC

## 2019-07-18 MED ORDER — PROPOFOL 500 MG/50ML IV EMUL
INTRAVENOUS | Status: DC | PRN
  Administered 2019-07-18: 75 ug/kg/min via INTRAVENOUS

## 2019-07-18 MED ORDER — METHYLENE BLUE 0.5 % INJ SOLN
INTRAVENOUS | Status: AC
  Filled 2019-07-18: qty 10

## 2019-07-18 MED ORDER — BUPIVACAINE HCL (PF) 0.25 % IJ SOLN
INTRAMUSCULAR | Status: DC | PRN
  Administered 2019-07-18: 10 mL

## 2019-07-18 MED ORDER — MIDAZOLAM HCL 5 MG/5ML IJ SOLN
INTRAMUSCULAR | Status: DC | PRN
  Administered 2019-07-18: 1 mg via INTRAVENOUS

## 2019-07-18 MED ORDER — ONDANSETRON HCL 4 MG/2ML IJ SOLN
INTRAMUSCULAR | Status: DC | PRN
  Administered 2019-07-18: 4 mg via INTRAVENOUS

## 2019-07-18 MED ORDER — MIDAZOLAM HCL 2 MG/2ML IJ SOLN
INTRAMUSCULAR | Status: AC
  Filled 2019-07-18: qty 2

## 2019-07-18 MED ORDER — FENTANYL CITRATE (PF) 100 MCG/2ML IJ SOLN: 25.0000 ug | INTRAMUSCULAR | Status: DC | PRN

## 2019-07-18 MED ORDER — LACTATED RINGERS IV SOLN: INTRAVENOUS | Status: DC

## 2019-07-18 MED ORDER — BUPIVACAINE HCL (PF) 0.25 % IJ SOLN
INTRAMUSCULAR | Status: AC
  Filled 2019-07-18: qty 210

## 2019-07-18 MED ORDER — FENTANYL CITRATE (PF) 100 MCG/2ML IJ SOLN
INTRAMUSCULAR | Status: DC | PRN
  Administered 2019-07-18: 50 ug via INTRAVENOUS

## 2019-07-18 MED ORDER — FENTANYL CITRATE (PF) 100 MCG/2ML IJ SOLN
INTRAMUSCULAR | Status: AC
  Filled 2019-07-18: qty 2

## 2019-07-18 MED ORDER — LIDOCAINE HCL (CARDIAC) PF 100 MG/5ML IV SOSY
PREFILLED_SYRINGE | INTRAVENOUS | Status: DC | PRN
  Administered 2019-07-18: 60 mg via INTRAVENOUS

## 2019-07-18 SURGICAL SUPPLY — 34 items
BENZOIN TINCTURE PRP APPL 2/3 (GAUZE/BANDAGES/DRESSINGS) IMPLANT
BLADE SURG 15 STRL LF DISP TIS (BLADE) ×1 IMPLANT
BLADE SURG 15 STRL SS (BLADE) ×2
CHLORAPREP W/TINT 26 (MISCELLANEOUS) ×3 IMPLANT
CLOSURE WOUND 1/2 X4 (GAUZE/BANDAGES/DRESSINGS)
COVER BACK TABLE 60X90IN (DRAPES) ×3 IMPLANT
COVER MAYO STAND STRL (DRAPES) ×3 IMPLANT
DECANTER SPIKE VIAL GLASS SM (MISCELLANEOUS) ×3 IMPLANT
DERMABOND ADVANCED (GAUZE/BANDAGES/DRESSINGS) ×2
DERMABOND ADVANCED .7 DNX12 (GAUZE/BANDAGES/DRESSINGS) ×1 IMPLANT
DRAPE LAPAROTOMY 100X72 PEDS (DRAPES) ×3 IMPLANT
DRAPE UTILITY XL STRL (DRAPES) ×3 IMPLANT
ELECT REM PT RETURN 9FT ADLT (ELECTROSURGICAL) ×3
ELECTRODE REM PT RTRN 9FT ADLT (ELECTROSURGICAL) ×1 IMPLANT
GLOVE BIO SURGEON STRL SZ7 (GLOVE) ×2 IMPLANT
GLOVE BIOGEL PI IND STRL 7.5 (GLOVE) IMPLANT
GLOVE BIOGEL PI IND STRL 8 (GLOVE) ×1 IMPLANT
GLOVE BIOGEL PI INDICATOR 7.5 (GLOVE) ×2
GLOVE BIOGEL PI INDICATOR 8 (GLOVE) ×2
GLOVE ECLIPSE 8.0 STRL XLNG CF (GLOVE) ×3 IMPLANT
GOWN STRL REUS W/ TWL LRG LVL3 (GOWN DISPOSABLE) ×2 IMPLANT
GOWN STRL REUS W/TWL LRG LVL3 (GOWN DISPOSABLE) ×4
NDL HYPO 25X1 1.5 SAFETY (NEEDLE) ×1 IMPLANT
NEEDLE HYPO 25X1 1.5 SAFETY (NEEDLE) ×3 IMPLANT
NS IRRIG 1000ML POUR BTL (IV SOLUTION) ×3 IMPLANT
PENCIL SMOKE EVACUATOR (MISCELLANEOUS) ×2 IMPLANT
SET BASIN DAY SURGERY F.S. (CUSTOM PROCEDURE TRAY) ×3 IMPLANT
SLEEVE SCD COMPRESS KNEE MED (MISCELLANEOUS) IMPLANT
SPONGE LAP 4X18 RFD (DISPOSABLE) ×3 IMPLANT
STRIP CLOSURE SKIN 1/2X4 (GAUZE/BANDAGES/DRESSINGS) IMPLANT
SUT MON AB 4-0 PC3 18 (SUTURE) ×3 IMPLANT
SUT VICRYL 3-0 CR8 SH (SUTURE) ×3 IMPLANT
SYR CONTROL 10ML LL (SYRINGE) ×3 IMPLANT
TOWEL GREEN STERILE FF (TOWEL DISPOSABLE) ×3 IMPLANT

## 2019-07-18 NOTE — Interval H&P Note (Signed)
History and Physical Interval Note:  07/18/2019 7:10 AM  Kathryn [name]  has presented today for surgery, with the diagnosis of PORT IN PLACE.  The various methods of treatment have been discussed with the patient and family. After consideration of risks, benefits and other options for treatment, the patient has consented to  Procedure(s): REMOVAL PORT-A-CATH (N/A) as a surgical intervention.  The patient's history has been reviewed, patient examined, no change in status, stable for surgery.  I have reviewed the patient's chart and labs.  Questions were answered to the patient's satisfaction.     [place]

## 2019-07-18 NOTE — Anesthesia Procedure Notes (Signed)
Procedure Name: MAC Date/Time: 07/18/2019 7:31 AM Performed by: Signe Colt, CRNA Pre-anesthesia Checklist: Patient identified, Emergency Drugs available, Suction available, Patient being monitored and Timeout performed Patient Re-evaluated:Patient Re-evaluated prior to induction Oxygen Delivery Method: Simple face mask

## 2019-07-18 NOTE — Op Note (Signed)

## 2019-07-18 NOTE — Transfer of Care (Signed)
Immediate Anesthesia Transfer of Care Note  Patient: ARNESHIA ADE  Procedure(s) Performed: REMOVAL PORT-A-CATH (Right Chest)  Patient Location: PACU  Anesthesia Type:MAC  Level of Consciousness: awake, alert , oriented and patient cooperative  Airway & Oxygen Therapy: Patient Spontanous Breathing and Patient connected to face mask oxygen  Post-op Assessment: Report given to RN and Post -op Vital signs reviewed and stable  Post vital signs: Reviewed and stable  Last Vitals:  Vitals Value Taken Time  BP 121/61 07/18/19 0743  Temp    Pulse 90 07/18/19 0744  Resp    SpO2 100 % 07/18/19 0744  Vitals shown include unvalidated device data.  Last Pain:  Vitals:   07/18/19 0644  TempSrc: Oral  PainSc: 0-No pain      Patients Stated Pain Goal: 0 (18/33/58 2518)  Complications: No apparent anesthesia complications

## 2019-07-18 NOTE — Anesthesia Postprocedure Evaluation (Signed)
Anesthesia Post Note  Patient: Kathryn [name]  Procedure(s) Performed: REMOVAL PORT-A-CATH (Right Chest)     Patient location during evaluation: PACU Anesthesia Type: MAC Level of consciousness: awake Pain management: pain level controlled Vital Signs Assessment: post-procedure vital signs reviewed and stable Respiratory status: spontaneous breathing Cardiovascular status: stable Postop Assessment: no apparent nausea or vomiting Anesthetic complications: no    Last Vitals:  Vitals:   07/18/19 0800 07/18/19 0827  BP:  117/79  Pulse: 90 84  Resp: 15 18  Temp:  36.5 C  SpO2: 100% 100%    Last Pain:  Vitals:   07/18/19 0827  TempSrc: Oral  PainSc: 0-No pain                 Julez Huseby

## 2019-07-18 NOTE — Discharge Instructions (Signed)
GENERAL SURGERY: POST OP INSTRUCTIONS  ######################################################################  EAT Gradually transition to a high fiber diet with a fiber supplement over the next few weeks after discharge.  Start with a pureed / full liquid diet (see below)  WALK Walk an hour a day.  Control your pain to do that.    CONTROL PAIN Control pain so that you can walk, sleep, tolerate sneezing/coughing, go up/down stairs.  HAVE A BOWEL MOVEMENT DAILY Keep your bowels regular to avoid problems.  OK to try a laxative to override constipation.  OK to use an antidairrheal to slow down diarrhea.  Call if not better after 2 tries  CALL IF YOU HAVE PROBLEMS/CONCERNS Call if you are still struggling despite following these instructions. Call if you have concerns not answered by these instructions  ######################################################################    1. DIET: Follow a light bland diet & liquids the first 24 hours after arrival home, such as soup, liquids, starches, etc.  Be sure to drink plenty of fluids.  Quickly advance to a usual solid diet within a few days.  Avoid fast food or heavy meals as your are more likely to get nauseated or have irregular bowels.  A low-fat, high-fiber diet for the rest of your life is ideal.   2. Take your usually prescribed home medications unless otherwise directed. 3. PAIN CONTROL: a. Pain is best controlled by a usual combination of three different methods TOGETHER: i. Ice/Heat ii. Over the counter pain medication iii. Prescription pain medication b. Most patients will experience some swelling and bruising around the incisions.  Ice packs or heating pads (30-60 minutes up to 6 times a day) will help. Use ice for the first few days to help decrease swelling and bruising, then switch to heat to help relax tight/sore spots and speed recovery.  Some people prefer to use ice alone, heat alone, alternating between ice & heat.   Experiment to what works for you.  Swelling and bruising can take several weeks to resolve.   c. It is helpful to take an over-the-counter pain medication regularly for the first few weeks.  Choose one of the following that works best for you: i. Naproxen (Aleve, etc)  Two 220mg tabs twice a day ii. Ibuprofen (Advil, etc) Three 200mg tabs four times a day (every meal & bedtime) iii. Acetaminophen (Tylenol, etc) 500-650mg four times a day (every meal & bedtime) d. A  prescription for pain medication (such as oxycodone, hydrocodone, etc) should be given to you upon discharge.  Take your pain medication as prescribed.  i. If you are having problems/concerns with the prescription medicine (does not control pain, nausea, vomiting, rash, itching, etc), please call us [phone] to see if we need to switch you to a different pain medicine that will work better for you and/or control your side effect better. ii. If you need a refill on your pain medication, please contact your pharmacy.  They will contact our office to request authorization. Prescriptions will not be filled after 5 pm or on week-ends. 4. Avoid getting constipated.  Between the surgery and the pain medications, it is common to experience some constipation.  Increasing fluid intake and taking a fiber supplement (such as Metamucil, Citrucel, FiberCon, MiraLax, etc) 1-2 times a day regularly will usually help prevent this problem from occurring.  A mild laxative (prune juice, Milk of Magnesia, MiraLax, etc) should be taken according to package directions if there are no bowel movements after 48 hours.   5. Wash /   shower every day.  You may shower over the dressings as they are waterproof.  Continue to shower over incision(s) after the dressing is off. 6. Remove your waterproof bandages 5 days after surgery.  You may leave the incision open to air.  You may have skin tapes (Steri Strips) covering the incision(s).  Leave them on until one week, then  remove.  You may replace a dressing/Band-Aid to cover the incision for comfort if you wish.      7. ACTIVITIES as tolerated:   a. You may resume regular (light) daily activities beginning the next day--such as daily self-care, walking, climbing stairs--gradually increasing activities as tolerated.  If you can walk 30 minutes without difficulty, it is safe to try more intense activity such as jogging, treadmill, bicycling, low-impact aerobics, swimming, etc. b. Save the most intensive and strenuous activity for last such as sit-ups, heavy lifting, contact sports, etc  Refrain from any heavy lifting or straining until you are off narcotics for pain control.   c. DO NOT PUSH THROUGH PAIN.  Let pain be your guide: If it hurts to do something, don't do it.  Pain is your body warning you to avoid that activity for another week until the pain goes down. d. You may drive when you are no longer taking prescription pain medication, you can comfortably wear a seatbelt, and you can safely maneuver your car and apply brakes. e. Dennis Bast may have sexual intercourse when it is comfortable.  8. FOLLOW UP in our office a. Please call [facility] at [phone] to set up an appointment to see your surgeon in the office for a follow-up appointment approximately 2-3 weeks after your surgery. b. Make sure that you call for this appointment the day you arrive home to insure a convenient appointment time. 9. IF YOU HAVE DISABILITY OR FAMILY LEAVE FORMS, BRING THEM TO THE OFFICE FOR PROCESSING.  DO NOT GIVE THEM TO YOUR DOCTOR.   WHEN TO CALL us [phone]: 1. Poor pain control 2. Reactions / problems with new medications (rash/itching, nausea, etc)  3. Fever over 101.5 F (38.5 C) 4. Worsening swelling or bruising 5. Continued bleeding from incision. 6. Increased pain, redness, or drainage from the incision 7. Difficulty breathing / swallowing   The clinic staff is available to answer your questions during regular  business hours (8:30am-5pm).  Please don't hesitate to call and ask to speak to one of our nurses for clinical concerns.   If you have a medical emergency, go to the nearest emergency room or call 911.  A surgeon from [facility] is always on call at the [facility], [address], [place], [place], [place]  [address] ? MAIN: [phone] ? TOLL FREE: [phone] ?  FAX [phone] [email or web address]   Post Anesthesia Home Care Instructions  Activity: Get plenty of rest for the remainder of the day. A responsible individual must stay with you for 24 hours following the procedure.  For the next 24 hours, DO NOT: -Drive a car -Paediatric nurse -Drink alcoholic beverages -Take any medication unless instructed by your physician -Make any legal decisions or sign important papers.  Meals: Start with liquid foods such as gelatin or soup. Progress to regular foods as tolerated. Avoid greasy, spicy, heavy foods. If nausea and/or vomiting occur, drink only clear liquids until the nausea and/or vomiting subsides. Call your physician if vomiting continues.  Special Instructions/Symptoms: Your throat may feel dry or sore  from the anesthesia or the breathing tube placed in your throat during surgery. If this causes discomfort, gargle with warm salt water. The discomfort should disappear within 24 hours.  If you had a scopolamine patch placed behind your ear for the management of post- operative nausea and/or vomiting:  1. The medication in the patch is effective for 72 hours, after which it should be removed.  Wrap patch in a tissue and discard in the trash. Wash hands thoroughly with soap and water. 2. You may remove the patch earlier than 72 hours if you experience unpleasant side effects which may include dry mouth, dizziness or visual disturbances. 3. Avoid touching the patch. Wash your hands with soap and water after contact  with the patch.

## 2019-07-18 NOTE — H&P (Signed)
Kathryn [name] Location: [facility] Patient #: [id number] DOB: 1960/04/01 Married / Language: English / Race: White Female  History of Present Illness  Patient words: The patient returns for follow-up of her stage II breast cancer. She had fallen about 4 months ago and fractured her right humerus. That is now healed up. She has completed chemotherapy and is here to discuss port removal. She has no other complaints.  The patient is a 59 year old female.   Allergies  No Known Allergies [04/11/2018]: No Known Drug Allergies [06/09/2019]: Allergies Reconciled  Medication History Capecitabine (500MG  Tablet, Oral) Active. Claritin (Oral) Specific strength unknown - Active. Medications Reconciled    Vitals  06/09/2019 9:09 AM Weight: 153.25 lb Height: 64in Body Surface Area: 1.75 m Body Mass Index: 26.31 kg/m  Temp.: 97.44F  Pulse: 114 (Regular)  BP: 108/70 (Sitting, Left Arm, Standard)        Physical Exam  General Mental Status-Alert. General Appearance-Consistent with stated age. Hydration-Well hydrated. Voice-Normal.  Breast Note: Port right upper chest noted.  Cardiovascular Cardiovascular examination reveals -normal heart sounds, regular rate and rhythm with no murmurs and normal pedal pulses bilaterally.  Neurologic Neurologic evaluation reveals -alert and oriented x 3 with no impairment of recent or remote memory. Mental Status-Normal.    Assessment & Plan  PORT-A-CATH IN PLACE [id number]) Impression: SCHEDULE FOR PORT REMOVAL TOTAL TIME 20 MIN discussing surgery, palpitations, treated options, face-to-face time and documentation.  Current Plans I recommended surgery to remove the catheter. I explained the technique of removal with use of local anesthesia & possible need for more aggressive sedation/anesthesia for patient comfort.  Risks such as bleeding, infection, and other  risks were discussed. Post-operative dressing/incision care was discussed. I noted a good likelihood this will help address the problem. We will work to minimize complications. Questions were answered. The patient expresses understanding & wishes to proceed with surgery.  You are being scheduled for surgery- Our schedulers will call you.  You should hear from our office's scheduling department within 5 working days about the location, date, and time of surgery. We try to make accommodations for patient's preferences in scheduling surgery, but sometimes the OR schedule or the surgeon's schedule prevents [place] from making those accommodations.  If you have not heard from our office [phone]) in 5 working days, call the office and ask for your surgeon's nurse.  If you have other questions about your diagnosis, plan, or surgery, call the office and ask for your surgeon's nurse.  Pt Education - [facility] General Post-op HCI  BREAST CANCER, LEFT (C50.912) Impression: stable NAD

## 2019-07-19 ENCOUNTER — Encounter: Payer: Self-pay | Admitting: *Deleted
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
## 2019-07-20 ENCOUNTER — Other Ambulatory Visit: Payer: Self-pay

## 2019-07-20 ENCOUNTER — Inpatient Hospital Stay: Payer: BC Managed Care – PPO

## 2019-07-20 ENCOUNTER — Inpatient Hospital Stay: Payer: BC Managed Care – PPO | Admitting: Oncology

## 2019-07-20 VITALS — BP 128/77 | HR 101 | Temp 98.5°F | Resp 18 | Ht 64.0 in | Wt 156.6 lb

## 2019-07-20 DIAGNOSIS — Z791 Long term (current) use of non-steroidal anti-inflammatories (NSAID): Secondary | ICD-10-CM | POA: Diagnosis not present

## 2019-07-20 DIAGNOSIS — C50412 Malignant neoplasm of upper-outer quadrant of left female breast: Secondary | ICD-10-CM | POA: Diagnosis not present

## 2019-07-20 DIAGNOSIS — M810 Age-related osteoporosis without current pathological fracture: Secondary | ICD-10-CM | POA: Diagnosis not present

## 2019-07-20 DIAGNOSIS — Z9221 Personal history of antineoplastic chemotherapy: Secondary | ICD-10-CM | POA: Diagnosis not present

## 2019-07-20 DIAGNOSIS — Z171 Estrogen receptor negative status [ER-]: Secondary | ICD-10-CM

## 2019-07-20 DIAGNOSIS — Z923 Personal history of irradiation: Secondary | ICD-10-CM | POA: Diagnosis not present

## 2019-07-20 DIAGNOSIS — Z79899 Other long term (current) drug therapy: Secondary | ICD-10-CM | POA: Diagnosis not present

## 2019-07-20 LAB — COMPREHENSIVE METABOLIC PANEL
ALT: 16 U/L (ref 0–44)
AST: 18 U/L (ref 15–41)
Albumin: 3.9 g/dL (ref 3.5–5.0)
Alkaline Phosphatase: 107 U/L (ref 38–126)
Anion gap: 9 (ref 5–15)
BUN: 12 mg/dL (ref 6–20)
CO2: 24 mmol/L (ref 22–32)
Calcium: 8.7 mg/dL — ABNORMAL LOW (ref 8.9–10.3)
Chloride: 106 mmol/L (ref 98–111)
Creatinine, Ser: 0.71 mg/dL (ref 0.44–1.00)
GFR calc Af Amer: >60 mL/min (ref >60)
GFR calc non Af Amer: >60 mL/min (ref >60)
Glucose, Bld: 81 mg/dL (ref 70–99)
Potassium: 4 mmol/L (ref 3.5–5.1)
Sodium: 139 mmol/L (ref 135–145)
Total Bilirubin: 0.8 mg/dL (ref 0.3–1.2)
Total Protein: 6.8 g/dL (ref 6.5–8.1)

## 2019-07-20 LAB — CBC WITH DIFFERENTIAL/PLATELET
Abs Immature Granulocytes: 0.01 K/uL (ref 0.00–0.07)
Basophils Absolute: 0 K/uL (ref 0.0–0.1)
Basophils Relative: 1 %
Eosinophils Absolute: 0.2 K/uL (ref 0.0–0.5)
Eosinophils Relative: 4 %
HCT: 35.7 % — ABNORMAL LOW (ref 36.0–46.0)
Hemoglobin: 12 g/dL (ref 12.0–15.0)
Immature Granulocytes: 0 %
Lymphocytes Relative: 28 %
Lymphs Abs: 1.1 K/uL (ref 0.7–4.0)
MCH: 37.6 pg — ABNORMAL HIGH (ref 26.0–34.0)
MCHC: 33.6 g/dL (ref 30.0–36.0)
MCV: 111.9 fL — ABNORMAL HIGH (ref 80.0–100.0)
Monocytes Absolute: 0.5 K/uL (ref 0.1–1.0)
Monocytes Relative: 13 %
Neutro Abs: 2.1 K/uL (ref 1.7–7.7)
Neutrophils Relative %: 54 %
Platelets: 173 K/uL (ref 150–400)
RBC: 3.19 MIL/uL — ABNORMAL LOW (ref 3.87–5.11)
RDW: 16.3 % — ABNORMAL HIGH (ref 11.5–15.5)
WBC: 3.8 K/uL — ABNORMAL LOW (ref 4.0–10.5)
nRBC: 0 % (ref 0.0–0.2)

## 2019-07-21 ENCOUNTER — Telehealth: Payer: Self-pay | Admitting: Oncology

## 2019-07-21 NOTE — Telephone Encounter (Signed)
Scheduled appts per 5/27 los. Pt confirmed appt date and time.

## 2019-07-31 DIAGNOSIS — C50412 Malignant neoplasm of upper-outer quadrant of left female breast: Secondary | ICD-10-CM | POA: Diagnosis not present

## 2019-07-31 DIAGNOSIS — Z171 Estrogen receptor negative status [ER-]: Secondary | ICD-10-CM | POA: Diagnosis not present
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
## 2019-08-10 ENCOUNTER — Ambulatory Visit (HOSPITAL_COMMUNITY)
Admission: RE | Admit: 2019-08-10 | Discharge: 2019-08-10 | Disposition: A | Discharge status: Home / Self Care | Payer: BC Managed Care – PPO | Source: Ambulatory Visit | Attending: Oncology | Admitting: Oncology

## 2019-08-10 ENCOUNTER — Other Ambulatory Visit: Payer: Self-pay

## 2019-08-10 ENCOUNTER — Inpatient Hospital Stay: Payer: BC Managed Care – PPO | Attending: Oncology

## 2019-08-10 ENCOUNTER — Inpatient Hospital Stay: Payer: BC Managed Care – PPO | Admitting: Oncology

## 2019-08-10 VITALS — BP 114/74 | HR 103 | Temp 98.5°F | Resp 18 | Ht 64.0 in | Wt 157.4 lb

## 2019-08-10 DIAGNOSIS — Z8051 Family history of malignant neoplasm of kidney: Secondary | ICD-10-CM | POA: Insufficient documentation

## 2019-08-10 DIAGNOSIS — Z79899 Other long term (current) drug therapy: Secondary | ICD-10-CM | POA: Diagnosis not present

## 2019-08-10 DIAGNOSIS — Z8249 Family history of ischemic heart disease and other diseases of the circulatory system: Secondary | ICD-10-CM | POA: Diagnosis not present

## 2019-08-10 DIAGNOSIS — M25552 Pain in left hip: Secondary | ICD-10-CM | POA: Diagnosis not present

## 2019-08-10 DIAGNOSIS — Z171 Estrogen receptor negative status [ER-]: Secondary | ICD-10-CM

## 2019-08-10 DIAGNOSIS — Z9221 Personal history of antineoplastic chemotherapy: Secondary | ICD-10-CM | POA: Insufficient documentation

## 2019-08-10 DIAGNOSIS — Z923 Personal history of irradiation: Secondary | ICD-10-CM | POA: Diagnosis not present

## 2019-08-10 DIAGNOSIS — C50412 Malignant neoplasm of upper-outer quadrant of left female breast: Secondary | ICD-10-CM | POA: Insufficient documentation

## 2019-08-10 DIAGNOSIS — Z808 Family history of malignant neoplasm of other organs or systems: Secondary | ICD-10-CM | POA: Diagnosis not present

## 2019-08-10 DIAGNOSIS — M810 Age-related osteoporosis without current pathological fracture: Secondary | ICD-10-CM | POA: Insufficient documentation

## 2019-08-10 DIAGNOSIS — Z801 Family history of malignant neoplasm of trachea, bronchus and lung: Secondary | ICD-10-CM | POA: Insufficient documentation

## 2019-08-10 LAB — COMPREHENSIVE METABOLIC PANEL
ALT: 17 U/L (ref 0–44)
AST: 17 U/L (ref 15–41)
Albumin: 4 g/dL (ref 3.5–5.0)
Alkaline Phosphatase: 109 U/L (ref 38–126)
Anion gap: 8 (ref 5–15)
BUN: 14 mg/dL (ref 6–20)
CO2: 27 mmol/L (ref 22–32)
Calcium: 9.4 mg/dL (ref 8.9–10.3)
Chloride: 105 mmol/L (ref 98–111)
Creatinine, Ser: 0.86 mg/dL (ref 0.44–1.00)
GFR calc Af Amer: >60 mL/min (ref >60)
GFR calc non Af Amer: >60 mL/min (ref >60)
Glucose, Bld: 82 mg/dL (ref 70–99)
Potassium: 4.3 mmol/L (ref 3.5–5.1)
Sodium: 140 mmol/L (ref 135–145)
Total Bilirubin: 0.7 mg/dL (ref 0.3–1.2)
Total Protein: 7 g/dL (ref 6.5–8.1)

## 2019-08-10 LAB — CBC WITH DIFFERENTIAL/PLATELET
Abs Immature Granulocytes: 0 10*3/uL (ref 0.00–0.07)
Basophils Absolute: 0 10*3/uL (ref 0.0–0.1)
Basophils Relative: 1 %
Eosinophils Absolute: 0.2 10*3/uL (ref 0.0–0.5)
Eosinophils Relative: 4 %
HCT: 36.3 % (ref 36.0–46.0)
Hemoglobin: 12.4 g/dL (ref 12.0–15.0)
Immature Granulocytes: 0 %
Lymphocytes Relative: 26 %
Lymphs Abs: 1.1 10*3/uL (ref 0.7–4.0)
MCH: 38 pg — ABNORMAL HIGH (ref 26.0–34.0)
MCHC: 34.2 g/dL (ref 30.0–36.0)
MCV: 111.3 fL — ABNORMAL HIGH (ref 80.0–100.0)
Monocytes Absolute: 0.5 10*3/uL (ref 0.1–1.0)
Monocytes Relative: 13 %
Neutro Abs: 2.5 10*3/uL (ref 1.7–7.7)
Neutrophils Relative %: 56 %
Platelets: 195 10*3/uL (ref 150–400)
RBC: 3.26 MIL/uL — ABNORMAL LOW (ref 3.87–5.11)
RDW: 15.8 % — ABNORMAL HIGH (ref 11.5–15.5)
WBC: 4.3 10*3/uL (ref 4.0–10.5)
nRBC: 0 % (ref 0.0–0.2)

## 2019-08-21 ENCOUNTER — Other Ambulatory Visit: Payer: Self-pay

## 2019-08-21 ENCOUNTER — Inpatient Hospital Stay: Payer: BC Managed Care – PPO

## 2019-08-21 DIAGNOSIS — Z9221 Personal history of antineoplastic chemotherapy: Secondary | ICD-10-CM | POA: Diagnosis not present

## 2019-08-21 DIAGNOSIS — Z801 Family history of malignant neoplasm of trachea, bronchus and lung: Secondary | ICD-10-CM | POA: Diagnosis not present

## 2019-08-21 DIAGNOSIS — Z8249 Family history of ischemic heart disease and other diseases of the circulatory system: Secondary | ICD-10-CM | POA: Diagnosis not present

## 2019-08-21 DIAGNOSIS — Z808 Family history of malignant neoplasm of other organs or systems: Secondary | ICD-10-CM | POA: Diagnosis not present

## 2019-08-21 DIAGNOSIS — Z171 Estrogen receptor negative status [ER-]: Secondary | ICD-10-CM

## 2019-08-21 DIAGNOSIS — M810 Age-related osteoporosis without current pathological fracture: Secondary | ICD-10-CM | POA: Diagnosis not present

## 2019-08-21 DIAGNOSIS — Z923 Personal history of irradiation: Secondary | ICD-10-CM | POA: Diagnosis not present

## 2019-08-21 DIAGNOSIS — Z8051 Family history of malignant neoplasm of kidney: Secondary | ICD-10-CM | POA: Diagnosis not present

## 2019-08-21 DIAGNOSIS — Z79899 Other long term (current) drug therapy: Secondary | ICD-10-CM | POA: Diagnosis not present

## 2019-08-21 DIAGNOSIS — C50412 Malignant neoplasm of upper-outer quadrant of left female breast: Secondary | ICD-10-CM | POA: Diagnosis not present

## 2019-08-21 LAB — COMPREHENSIVE METABOLIC PANEL
ALT: 29 U/L (ref 0–44)
AST: 23 U/L (ref 15–41)
Albumin: 3.8 g/dL (ref 3.5–5.0)
Alkaline Phosphatase: 91 U/L (ref 38–126)
Anion gap: 10 (ref 5–15)
BUN: 16 mg/dL (ref 6–20)
CO2: 22 mmol/L (ref 22–32)
Calcium: 8.8 mg/dL — ABNORMAL LOW (ref 8.9–10.3)
Chloride: 107 mmol/L (ref 98–111)
Creatinine, Ser: 0.81 mg/dL (ref 0.44–1.00)
GFR calc Af Amer: >60 mL/min (ref >60)
GFR calc non Af Amer: >60 mL/min (ref >60)
Glucose, Bld: 78 mg/dL (ref 70–99)
Potassium: 4.5 mmol/L (ref 3.5–5.1)
Sodium: 139 mmol/L (ref 135–145)
Total Bilirubin: 0.6 mg/dL (ref 0.3–1.2)
Total Protein: 6.8 g/dL (ref 6.5–8.1)

## 2019-08-21 LAB — CBC WITH DIFFERENTIAL/PLATELET
Abs Immature Granulocytes: 0.01 10*3/uL (ref 0.00–0.07)
Basophils Absolute: 0 10*3/uL (ref 0.0–0.1)
Basophils Relative: 1 %
Eosinophils Absolute: 0.2 10*3/uL (ref 0.0–0.5)
Eosinophils Relative: 3 %
HCT: 36.7 % (ref 36.0–46.0)
Hemoglobin: 12.5 g/dL (ref 12.0–15.0)
Immature Granulocytes: 0 %
Lymphocytes Relative: 24 %
Lymphs Abs: 1.1 10*3/uL (ref 0.7–4.0)
MCH: 37.7 pg — ABNORMAL HIGH (ref 26.0–34.0)
MCHC: 34.1 g/dL (ref 30.0–36.0)
MCV: 110.5 fL — ABNORMAL HIGH (ref 80.0–100.0)
Monocytes Absolute: 0.6 10*3/uL (ref 0.1–1.0)
Monocytes Relative: 12 %
Neutro Abs: 2.8 10*3/uL (ref 1.7–7.7)
Neutrophils Relative %: 60 %
Platelets: 154 10*3/uL (ref 150–400)
RBC: 3.32 MIL/uL — ABNORMAL LOW (ref 3.87–5.11)
RDW: 14.6 % (ref 11.5–15.5)
WBC: 4.8 10*3/uL (ref 4.0–10.5)
nRBC: 0 % (ref 0.0–0.2)

## 2019-10-18 ENCOUNTER — Other Ambulatory Visit: Payer: Self-pay

## 2019-10-18 ENCOUNTER — Inpatient Hospital Stay: Payer: BC Managed Care – PPO | Attending: Oncology

## 2019-10-18 ENCOUNTER — Inpatient Hospital Stay: Payer: BC Managed Care – PPO | Admitting: Oncology

## 2019-10-18 VITALS — BP 115/70 | HR 88 | Temp 97.6°F | Resp 18 | Ht 64.0 in | Wt 160.8 lb

## 2019-10-18 DIAGNOSIS — C50412 Malignant neoplasm of upper-outer quadrant of left female breast: Secondary | ICD-10-CM | POA: Insufficient documentation

## 2019-10-18 DIAGNOSIS — M818 Other osteoporosis without current pathological fracture: Secondary | ICD-10-CM | POA: Diagnosis not present

## 2019-10-18 DIAGNOSIS — Z171 Estrogen receptor negative status [ER-]: Secondary | ICD-10-CM | POA: Diagnosis not present

## 2019-10-18 DIAGNOSIS — Z923 Personal history of irradiation: Secondary | ICD-10-CM | POA: Diagnosis not present

## 2019-10-18 LAB — CBC WITH DIFFERENTIAL/PLATELET
Abs Immature Granulocytes: 0.01 10*3/uL (ref 0.00–0.07)
Basophils Absolute: 0 10*3/uL (ref 0.0–0.1)
Basophils Relative: 1 %
Eosinophils Absolute: 0.1 10*3/uL (ref 0.0–0.5)
Eosinophils Relative: 2 %
HCT: 37.6 % (ref 36.0–46.0)
Hemoglobin: 12.5 g/dL (ref 12.0–15.0)
Immature Granulocytes: 0 %
Lymphocytes Relative: 25 %
Lymphs Abs: 1.4 10*3/uL (ref 0.7–4.0)
MCH: 33.1 pg (ref 26.0–34.0)
MCHC: 33.2 g/dL (ref 30.0–36.0)
MCV: 99.5 fL (ref 80.0–100.0)
Monocytes Absolute: 0.5 10*3/uL (ref 0.1–1.0)
Monocytes Relative: 9 %
Neutro Abs: 3.7 10*3/uL (ref 1.7–7.7)
Neutrophils Relative %: 63 %
Platelets: 211 10*3/uL (ref 150–400)
RBC: 3.78 MIL/uL — ABNORMAL LOW (ref 3.87–5.11)
RDW: 12.6 % (ref 11.5–15.5)
WBC: 5.8 10*3/uL (ref 4.0–10.5)
nRBC: 0 % (ref 0.0–0.2)

## 2019-10-18 LAB — COMPREHENSIVE METABOLIC PANEL
ALT: 12 U/L (ref 0–44)
AST: 16 U/L (ref 15–41)
Albumin: 3.8 g/dL (ref 3.5–5.0)
Alkaline Phosphatase: 90 U/L (ref 38–126)
Anion gap: 6 (ref 5–15)
BUN: 15 mg/dL (ref 6–20)
CO2: 28 mmol/L (ref 22–32)
Calcium: 9.5 mg/dL (ref 8.9–10.3)
Chloride: 106 mmol/L (ref 98–111)
Creatinine, Ser: 0.78 mg/dL (ref 0.44–1.00)
GFR calc Af Amer: >60 mL/min (ref >60)
GFR calc non Af Amer: >60 mL/min (ref >60)
Glucose, Bld: 98 mg/dL (ref 70–99)
Potassium: 3.9 mmol/L (ref 3.5–5.1)
Sodium: 140 mmol/L (ref 135–145)
Total Bilirubin: 0.6 mg/dL (ref 0.3–1.2)
Total Protein: 7 g/dL (ref 6.5–8.1)
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
## 2019-10-19 ENCOUNTER — Telehealth: Payer: Self-pay | Admitting: Oncology

## 2019-10-19 NOTE — Telephone Encounter (Signed)
No 8/25 los. No changes made to pt's schedule.
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
## 2019-12-28 ENCOUNTER — Encounter: Payer: Self-pay | Admitting: Oncology

## 2019-12-28 ENCOUNTER — Inpatient Hospital Stay: Payer: BC Managed Care – PPO

## 2019-12-28 ENCOUNTER — Other Ambulatory Visit: Payer: Self-pay

## 2019-12-28 ENCOUNTER — Inpatient Hospital Stay: Payer: BC Managed Care – PPO | Attending: Oncology | Admitting: Oncology

## 2019-12-28 VITALS — BP 125/84 | HR 93 | Temp 98.7°F | Resp 17 | Ht 64.0 in | Wt 161.6 lb

## 2019-12-28 DIAGNOSIS — Z79899 Other long term (current) drug therapy: Secondary | ICD-10-CM | POA: Insufficient documentation

## 2019-12-28 DIAGNOSIS — Z9221 Personal history of antineoplastic chemotherapy: Secondary | ICD-10-CM | POA: Insufficient documentation

## 2019-12-28 DIAGNOSIS — Z801 Family history of malignant neoplasm of trachea, bronchus and lung: Secondary | ICD-10-CM | POA: Insufficient documentation

## 2019-12-28 DIAGNOSIS — C50412 Malignant neoplasm of upper-outer quadrant of left female breast: Secondary | ICD-10-CM

## 2019-12-28 DIAGNOSIS — Z171 Estrogen receptor negative status [ER-]: Secondary | ICD-10-CM | POA: Insufficient documentation

## 2019-12-28 DIAGNOSIS — Z8249 Family history of ischemic heart disease and other diseases of the circulatory system: Secondary | ICD-10-CM | POA: Insufficient documentation

## 2019-12-28 DIAGNOSIS — Z8051 Family history of malignant neoplasm of kidney: Secondary | ICD-10-CM | POA: Insufficient documentation

## 2019-12-28 DIAGNOSIS — Z923 Personal history of irradiation: Secondary | ICD-10-CM | POA: Insufficient documentation

## 2019-12-28 DIAGNOSIS — Z853 Personal history of malignant neoplasm of breast: Secondary | ICD-10-CM | POA: Diagnosis not present

## 2019-12-28 DIAGNOSIS — M810 Age-related osteoporosis without current pathological fracture: Secondary | ICD-10-CM | POA: Diagnosis not present

## 2019-12-28 LAB — CBC WITH DIFFERENTIAL/PLATELET
Abs Immature Granulocytes: 0.02 10*3/uL (ref 0.00–0.07)
Basophils Absolute: 0 10*3/uL (ref 0.0–0.1)
Basophils Relative: 1 %
Eosinophils Absolute: 0.1 10*3/uL (ref 0.0–0.5)
Eosinophils Relative: 2 %
HCT: 41.9 % (ref 36.0–46.0)
Hemoglobin: 13.6 g/dL (ref 12.0–15.0)
Immature Granulocytes: 0 %
Lymphocytes Relative: 26 %
Lymphs Abs: 1.4 10*3/uL (ref 0.7–4.0)
MCH: 30.9 pg (ref 26.0–34.0)
MCHC: 32.5 g/dL (ref 30.0–36.0)
MCV: 95.2 fL (ref 80.0–100.0)
Monocytes Absolute: 0.5 10*3/uL (ref 0.1–1.0)
Monocytes Relative: 9 %
Neutro Abs: 3.4 10*3/uL (ref 1.7–7.7)
Neutrophils Relative %: 62 %
Platelets: 229 10*3/uL (ref 150–400)
RBC: 4.4 MIL/uL (ref 3.87–5.11)
RDW: 13.3 % (ref 11.5–15.5)
WBC: 5.4 10*3/uL (ref 4.0–10.5)
nRBC: 0 % (ref 0.0–0.2)

## 2019-12-28 LAB — COMPREHENSIVE METABOLIC PANEL
ALT: 21 U/L (ref 0–44)
AST: 21 U/L (ref 15–41)
Albumin: 3.9 g/dL (ref 3.5–5.0)
Alkaline Phosphatase: 84 U/L (ref 38–126)
Anion gap: 7 (ref 5–15)
BUN: 13 mg/dL (ref 6–20)
CO2: 27 mmol/L (ref 22–32)
Calcium: 9 mg/dL (ref 8.9–10.3)
Chloride: 105 mmol/L (ref 98–111)
Creatinine, Ser: 0.69 mg/dL (ref 0.44–1.00)
GFR, Estimated: >60 mL/min (ref >60)
Glucose, Bld: 88 mg/dL (ref 70–99)
Potassium: 4 mmol/L (ref 3.5–5.1)
Sodium: 139 mmol/L (ref 135–145)
Total Bilirubin: 0.5 mg/dL (ref 0.3–1.2)
Total Protein: 7.2 g/dL (ref 6.5–8.1)

## 2020-01-01 ENCOUNTER — Encounter: Payer: Self-pay | Admitting: Oncology

## 2020-01-25 ENCOUNTER — Other Ambulatory Visit: Payer: Self-pay

## 2020-01-25 ENCOUNTER — Inpatient Hospital Stay: Payer: BC Managed Care – PPO | Attending: Oncology

## 2020-01-25 VITALS — BP 126/78 | HR 78 | Temp 98.2°F | Resp 18

## 2020-01-25 DIAGNOSIS — C50412 Malignant neoplasm of upper-outer quadrant of left female breast: Secondary | ICD-10-CM | POA: Diagnosis not present

## 2020-01-25 DIAGNOSIS — Z95828 Presence of other vascular implants and grafts: Secondary | ICD-10-CM

## 2020-01-25 DIAGNOSIS — Z171 Estrogen receptor negative status [ER-]: Secondary | ICD-10-CM

## 2020-01-25 DIAGNOSIS — M810 Age-related osteoporosis without current pathological fracture: Secondary | ICD-10-CM | POA: Insufficient documentation

## 2020-01-25 MED ORDER — SODIUM CHLORIDE 0.9 % IV SOLN
INTRAVENOUS | Status: DC
  Filled 2020-01-25: qty 250

## 2020-01-25 MED ORDER — ZOLEDRONIC ACID 4 MG/100ML IV SOLN
4.0000 mg | Freq: Once | INTRAVENOUS | Status: AC
  Administered 2020-01-25: 4 mg via INTRAVENOUS

## 2020-01-25 NOTE — Progress Notes (Signed)
Pt discharged in no apparent distress. Pt left ambulatory without assistance. Pt aware of discharge instructions and verbalized understanding and had no further questions.  

## 2020-01-25 NOTE — Patient Instructions (Signed)
Zoledronic Acid injection (Hypercalcemia, Oncology) What is this medicine? ZOLEDRONIC ACID (ZOE le dron ik AS id) lowers the amount of calcium loss from bone. It is used to treat too much calcium in your blood from cancer. It is also used to prevent complications of cancer that has spread to the bone. This medicine may be used for other purposes; ask your health care provider or pharmacist if you have questions. COMMON BRAND NAME(S): Zometa What should I tell my health care provider before I take this medicine? They need to know if you have any of these conditions:  aspirin-sensitive asthma  cancer, especially if you are receiving medicines used to treat cancer  dental disease or wear dentures  infection  kidney disease  receiving corticosteroids like dexamethasone or prednisone  an unusual or allergic reaction to zoledronic acid, other medicines, foods, dyes, or preservatives  pregnant or trying to get pregnant  breast-feeding How should I use this medicine? This medicine is for infusion into a vein. It is given by a health care professional in a hospital or clinic setting. Talk to your pediatrician regarding the use of this medicine in children. Special care may be needed. Overdosage: If you think you have taken too much of this medicine contact a poison control center or emergency room at once. NOTE: This medicine is only for you. Do not share this medicine with others. What if I miss a dose? It is important not to miss your dose. Call your doctor or health care professional if you are unable to keep an appointment. What may interact with this medicine?  certain antibiotics given by injection  NSAIDs, medicines for pain and inflammation, like ibuprofen or naproxen  some diuretics like bumetanide, furosemide  teriparatide  thalidomide This list may not describe all possible interactions. Give your health care provider a list of all the medicines, herbs, non-prescription  drugs, or dietary supplements you use. Also tell them if you smoke, drink alcohol, or use illegal drugs. Some items may interact with your medicine. What should I watch for while using this medicine? Visit your doctor or health care professional for regular checkups. It may be some time before you see the benefit from this medicine. Do not stop taking your medicine unless your doctor tells you to. Your doctor may order blood tests or other tests to see how you are doing. Women should inform their doctor if they wish to become pregnant or think they might be pregnant. There is a potential for serious side effects to an unborn child. Talk to your health care professional or pharmacist for more information. You should make sure that you get enough calcium and vitamin D while you are taking this medicine. Discuss the foods you eat and the vitamins you take with your health care professional. Some people who take this medicine have severe bone, joint, and/or muscle pain. This medicine may also increase your risk for jaw problems or a broken thigh bone. Tell your doctor right away if you have severe pain in your jaw, bones, joints, or muscles. Tell your doctor if you have any pain that does not go away or that gets worse. Tell your dentist and dental surgeon that you are taking this medicine. You should not have major dental surgery while on this medicine. See your dentist to have a dental exam and fix any dental problems before starting this medicine. Take good care of your teeth while on this medicine. Make sure you see your dentist for regular follow-up   appointments. What side effects may I notice from receiving this medicine? Side effects that you should report to your doctor or health care professional as soon as possible:  allergic reactions like skin rash, itching or hives, swelling of the face, lips, or tongue  anxiety, confusion, or depression  breathing problems  changes in vision  eye  pain  feeling faint or lightheaded, falls  jaw pain, especially after dental work  mouth sores  muscle cramps, stiffness, or weakness  redness, blistering, peeling or loosening of the skin, including inside the mouth  trouble passing urine or change in the amount of urine Side effects that usually do not require medical attention (report to your doctor or health care professional if they continue or are bothersome):  bone, joint, or muscle pain  constipation  diarrhea  fever  hair loss  irritation at site where injected  loss of appetite  nausea, vomiting  stomach upset  trouble sleeping  trouble swallowing  weak or tired This list may not describe all possible side effects. Call your doctor for medical advice about side effects. You may report side effects to FDA at 1-800-FDA-1088. Where should I keep my medicine? This drug is given in a hospital or clinic and will not be stored at home. NOTE: This sheet is a summary. It may not cover all possible information. If you have questions about this medicine, talk to your doctor, pharmacist, or health care provider.  2020 Elsevier/Gold Standard (2013-07-08 14:19:39)  

## 2020-02-12 ENCOUNTER — Encounter: Payer: Self-pay | Admitting: Oncology

## 2020-02-19 ENCOUNTER — Other Ambulatory Visit: Payer: Self-pay | Admitting: Oncology

## 2020-02-19 DIAGNOSIS — Z853 Personal history of malignant neoplasm of breast: Secondary | ICD-10-CM

## 2020-02-25 ENCOUNTER — Encounter: Payer: Self-pay | Admitting: Oncology

## 2020-02-26 ENCOUNTER — Telehealth: Payer: Self-pay | Admitting: *Deleted

## 2020-02-26 NOTE — Telephone Encounter (Signed)
Note sent per [facility] CHART on 02/25/2019- forwarding to NP covering for Dr Darnelle Catalan-  Good Evening, I started feeling bad last Sunday evening and started isolation on Monday. I started with a headache on Sunday and Monday then ran a fever on Monday, Tuesday and Wednesday with no other symptoms. I started taking Corcidin on Tuesday for cold and flu. I took a at home covid test on Friday and it came back positive. I have a little cough and stuffy nose but no fever since Wednesday night. I took another covid test Sunday evening and it came back positive. I have no other symptoms and feel fine.  I was wondering if I need to make an appointment for a Covid test at a clinic.   Thank you, Kathryn [name]

## 2020-02-26 NOTE — Telephone Encounter (Signed)
See pt's mychart message

## 2020-04-04 ENCOUNTER — Other Ambulatory Visit: Payer: Self-pay | Admitting: Oncology

## 2020-04-04 ENCOUNTER — Ambulatory Visit
Admission: RE | Admit: 2020-04-04 | Discharge: 2020-04-04 | Disposition: A | Discharge status: Home / Self Care | Payer: BC Managed Care – PPO | Source: Ambulatory Visit | Attending: Oncology | Admitting: Oncology

## 2020-04-04 ENCOUNTER — Other Ambulatory Visit: Payer: Self-pay

## 2020-04-04 DIAGNOSIS — R928 Other abnormal and inconclusive findings on diagnostic imaging of breast: Secondary | ICD-10-CM

## 2020-04-04 DIAGNOSIS — Z853 Personal history of malignant neoplasm of breast: Secondary | ICD-10-CM

## 2020-04-04 DIAGNOSIS — R922 Inconclusive mammogram: Secondary | ICD-10-CM | POA: Diagnosis not present

## 2020-04-04 DIAGNOSIS — N6489 Other specified disorders of breast: Secondary | ICD-10-CM | POA: Diagnosis not present

## 2020-04-09 ENCOUNTER — Ambulatory Visit
Admission: RE | Admit: 2020-04-09 | Discharge: 2020-04-09 | Disposition: A | Discharge status: Home / Self Care | Payer: BC Managed Care – PPO | Source: Ambulatory Visit | Attending: Oncology | Admitting: Oncology

## 2020-04-09 ENCOUNTER — Other Ambulatory Visit: Payer: Self-pay

## 2020-04-09 ENCOUNTER — Other Ambulatory Visit: Payer: Self-pay | Admitting: Oncology

## 2020-04-09 ENCOUNTER — Other Ambulatory Visit: Payer: Self-pay | Admitting: Diagnostic Radiology

## 2020-04-09 DIAGNOSIS — C50812 Malignant neoplasm of overlapping sites of left female breast: Secondary | ICD-10-CM | POA: Diagnosis not present

## 2020-04-09 DIAGNOSIS — R928 Other abnormal and inconclusive findings on diagnostic imaging of breast: Secondary | ICD-10-CM

## 2020-04-09 DIAGNOSIS — C50412 Malignant neoplasm of upper-outer quadrant of left female breast: Secondary | ICD-10-CM | POA: Diagnosis not present

## 2020-04-10 ENCOUNTER — Telehealth: Payer: Self-pay | Admitting: *Deleted

## 2020-04-10 ENCOUNTER — Encounter: Payer: Self-pay | Admitting: *Deleted

## 2020-04-10 ENCOUNTER — Other Ambulatory Visit: Payer: Self-pay | Admitting: Oncology

## 2020-04-10 DIAGNOSIS — C50412 Malignant neoplasm of upper-outer quadrant of left female breast: Secondary | ICD-10-CM

## 2020-04-10 NOTE — Telephone Encounter (Signed)
Spoke with patient to schedule an appointment with Dr. Jana Hakim for 2/22 at [facility] per Dr. Jana Hakim.

## 2020-04-15 NOTE — Progress Notes (Signed)
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 POLD1, POLE, POT1, PRKAR1A, PTCH1, PTEN, RAD50, RAD51C, RAD51D, RB1, RECQL4, RET, RNF43, RUNX1, SDHAF2, SDHA (sequence changes only), SDHB, SDHC, SDHD, SMAD4, SMARCA4, SMARCB1, SMARCE1, STK11, SUFU, TERC, TERT, TMEM127, TP53, TSC1, TSC2, VHL, WRN and WT1.   (6) [facility] report from 09/23/2018 pathology confirms triple negative disease, with negative androgen receptor, proficient mismatch repair status, negative PD-L1, negative BRAF of or TRK ABC mutations; there is a PTEN mutation  (7) osteoporosis:  (a) bone density at the breast center 05/10/2019 shows a T score of -2.8  (b) zoledronate started 07/18/2019, to be repeated every 6 months x 2 years  RECURRENT DISEASE: FEB 2022 (8) left breast biopsy x2 on 04/09/2020 shows multifocal invasive ductal carcinoma (1.6 and 0.4 cm), grade 3, functionally triple negative, with an MIB-1 of 60%.  (a) CT scans of the chest abdomen and pelvis  (b) bone scan  (c) CA 27-29  (9) neoadjuvant chemotherapy will consist of  cyclophosphamide and docetaxel every 3 weeks x 4  (10) definitive surgery to follow   PLAN: Andrina is now a year and three quarters out from her initial surgery for breast cancer.  Despite neoadjuvant chemotherapy she had a positive lymph node which raised her risk of recurrence.  We gave her capecitabine adjuvantly and also started her on zoledronate to decrease the risk of recurrence but unfortunately her disease is now back.  She understands that she will need a left mastectomy.  She is not sure whether she wants a right mastectomy for symmetry, cosmesis, and possibly for peace of mind, but understands that this will make no difference to her  survival.  A second surgical decision is whether to have reconstruction or not and what kind of reconstruction.  I think she would be best served with neoadjuvant treatment which would give her ample time to make her surgical plans without feeling pressured about it.  Specifically I suggested cyclophosphamide and docetaxel to be given every 3weeks for 4 doses.  She will need a port and I have contacted her surgeon regarding these plans.  She also needs staging studies and I am setting her up for a CT of the chest abdomen and pelvis and a bone scan to be done hopefully within the next week.  Our target starting date is 04/30/2020.  She will meet with our chemotherapy teaching nurse before that.  Total encounter time 65 minutes.Sarajane Jews C. Maryum Batterson, MD Medical Oncology and Hematology [facility] [place], [place] [address] Tel. [phone]    Fax. [phone]   I, Wilburn Mylar, am acting as scribe for Dr. Virgie Dad. Berley Gambrell.  I, Lurline Del MD, have reviewed the above documentation for accuracy and completeness, and I agree with the above.   *Total Encounter Time as defined by the Centers for Medicare and Medicaid Services includes, in addition to the face-to-face time of a patient visit (documented in the  note above) non-face-to-face time: obtaining and reviewing outside history, ordering and reviewing medications, tests or procedures, care coordination (communications with other health care professionals or caregivers) and documentation in the medical record.

## 2020-04-16 ENCOUNTER — Inpatient Hospital Stay: Payer: BC Managed Care – PPO

## 2020-04-16 ENCOUNTER — Inpatient Hospital Stay: Payer: BC Managed Care – PPO | Attending: Oncology | Admitting: Oncology

## 2020-04-16 ENCOUNTER — Other Ambulatory Visit: Payer: Self-pay

## 2020-04-16 VITALS — BP 124/74 | HR 100 | Temp 98.8°F | Resp 18 | Ht 64.0 in | Wt 161.5 lb

## 2020-04-16 DIAGNOSIS — Z171 Estrogen receptor negative status [ER-]: Secondary | ICD-10-CM | POA: Insufficient documentation

## 2020-04-16 DIAGNOSIS — Z9221 Personal history of antineoplastic chemotherapy: Secondary | ICD-10-CM | POA: Diagnosis not present

## 2020-04-16 DIAGNOSIS — C50412 Malignant neoplasm of upper-outer quadrant of left female breast: Secondary | ICD-10-CM | POA: Insufficient documentation

## 2020-04-16 DIAGNOSIS — Z923 Personal history of irradiation: Secondary | ICD-10-CM | POA: Diagnosis not present

## 2020-04-16 DIAGNOSIS — C773 Secondary and unspecified malignant neoplasm of axilla and upper limb lymph nodes: Secondary | ICD-10-CM | POA: Diagnosis not present

## 2020-04-16 DIAGNOSIS — M810 Age-related osteoporosis without current pathological fracture: Secondary | ICD-10-CM | POA: Diagnosis not present

## 2020-04-16 LAB — COMPREHENSIVE METABOLIC PANEL
ALT: 13 U/L (ref 0–44)
AST: 18 U/L (ref 15–41)
Albumin: 4.2 g/dL (ref 3.5–5.0)
Alkaline Phosphatase: 75 U/L (ref 38–126)
Anion gap: 9 (ref 5–15)
BUN: 10 mg/dL (ref 6–20)
CO2: 25 mmol/L (ref 22–32)
Calcium: 9.1 mg/dL (ref 8.9–10.3)
Chloride: 107 mmol/L (ref 98–111)
Creatinine, Ser: 0.7 mg/dL (ref 0.44–1.00)
GFR, Estimated: >60 mL/min (ref >60)
Glucose, Bld: 98 mg/dL (ref 70–99)
Potassium: 4 mmol/L (ref 3.5–5.1)
Sodium: 141 mmol/L (ref 135–145)
Total Bilirubin: 0.4 mg/dL (ref 0.3–1.2)
Total Protein: 7.4 g/dL (ref 6.5–8.1)

## 2020-04-16 LAB — CBC WITH DIFFERENTIAL/PLATELET
Abs Immature Granulocytes: 0.01 10*3/uL (ref 0.00–0.07)
Basophils Absolute: 0 10*3/uL (ref 0.0–0.1)
Basophils Relative: 1 %
Eosinophils Absolute: 0.1 10*3/uL (ref 0.0–0.5)
Eosinophils Relative: 1 %
HCT: 40 % (ref 36.0–46.0)
Hemoglobin: 13.1 g/dL (ref 12.0–15.0)
Immature Granulocytes: 0 %
Lymphocytes Relative: 27 %
Lymphs Abs: 1.7 10*3/uL (ref 0.7–4.0)
MCH: 31 pg (ref 26.0–34.0)
MCHC: 32.8 g/dL (ref 30.0–36.0)
MCV: 94.6 fL (ref 80.0–100.0)
Monocytes Absolute: 0.6 10*3/uL (ref 0.1–1.0)
Monocytes Relative: 9 %
Neutro Abs: 4 10*3/uL (ref 1.7–7.7)
Neutrophils Relative %: 62 %
Platelets: 242 10*3/uL (ref 150–400)
RBC: 4.23 MIL/uL (ref 3.87–5.11)
RDW: 13.4 % (ref 11.5–15.5)
WBC: 6.5 10*3/uL (ref 4.0–10.5)
nRBC: 0 % (ref 0.0–0.2)

## 2020-04-16 MED ORDER — DEXAMETHASONE 4 MG PO TABS: 8.0000 mg | ORAL_TABLET | Freq: Two times a day (BID) | ORAL | 1 refills | Status: DC

## 2020-04-16 MED ORDER — LORATADINE 10 MG PO TABS: 10.0000 mg | ORAL_TABLET | Freq: Every day | ORAL | 1 refills | Status: DC

## 2020-04-16 MED ORDER — PROCHLORPERAZINE MALEATE 10 MG PO TABS: 10.0000 mg | ORAL_TABLET | Freq: Four times a day (QID) | ORAL | 1 refills | Status: DC | PRN

## 2020-04-16 MED ORDER — LIDOCAINE-PRILOCAINE 2.5-2.5 % EX CREA: TOPICAL_CREAM | CUTANEOUS | 3 refills | Status: DC

## 2020-04-16 NOTE — Progress Notes (Signed)
DISCONTINUE ON PATHWAY REGIMEN - Breast     A cycle is every 14 days (cycles 1-4):     Doxorubicin      Cyclophosphamide      Pegfilgrastim-xxxx    A cycle is every 21 days (cycles 5-8):     Paclitaxel      Carboplatin   **Always confirm dose/schedule in your pharmacy ordering system**  REASON: Disease Progression PRIOR TREATMENT: [id number]: Dose-Dense AC [Doxorubicin + Cyclophosphamide q14 Days x 4 Cycles], Followed by Paclitaxel 80 mg/m2 Weekly + Carboplatin AUC=6 q21 Days x 12 Weeks TREATMENT RESPONSE: Unable to Evaluate  START ON PATHWAY REGIMEN - Breast     A cycle is every 21 days:     Docetaxel      Cyclophosphamide   **Always confirm dose/schedule in your pharmacy ordering system**  Patient Characteristics: Locoregional  Recurrent Disease - Resected, HER2 Negative/Unknown/Equivocal, ER Negative/Unknown, Not an Anthracycline Candidate and  Greater than 6 Months Since Prior Taxane Therapeutic Status: Locoregional Recurrent Disease - Resected ER Status: Negative (-) HER2 Status: Negative (-) PR Status: Negative (-) Intent of Therapy: Curative Intent, Discussed with Patient 

## 2020-04-17 ENCOUNTER — Encounter: Payer: Self-pay | Admitting: *Deleted

## 2020-04-17 ENCOUNTER — Telehealth: Payer: Self-pay | Admitting: Oncology

## 2020-04-17 ENCOUNTER — Ambulatory Visit: Payer: Self-pay | Admitting: Surgery

## 2020-04-17 LAB — CANCER ANTIGEN 27.29: CA 27.29: 24.2 U/mL (ref 0.0–38.6)

## 2020-04-17 NOTE — Telephone Encounter (Signed)
Scheduled appts per 2/22 los. Pt confirmed appt dates/times.

## 2020-04-18 ENCOUNTER — Encounter: Payer: Self-pay | Admitting: Oncology

## 2020-04-18 NOTE — Patient Instructions (Addendum)
DUE TO COVID-19 ONLY ONE VISITOR IS ALLOWED TO COME WITH YOU AND STAY IN THE WAITING ROOM ONLY DURING PRE OP AND PROCEDURE DAY OF SURGERY. THE 1 VISITOR  MAY VISIT WITH YOU AFTER SURGERY IN YOUR PRIVATE ROOM DURING VISITING HOURS ONLY!  YOU NEED TO HAVE A COVID 19 TEST ON_2/28______ @__2 :05_____, THIS TEST MUST BE DONE BEFORE SURGERY,  COVID TESTING SITE [address] [place] [address], IT IS ON THE RIGHT GOING OUT [address] APPROXIMATELY  2 MINUTES PAST [employer] ON THE RIGHT. ONCE YOUR COVID TEST IS COMPLETED,  PLEASE BEGIN THE QUARANTINE INSTRUCTIONS AS OUTLINED IN YOUR HANDOUT.                CHINITA SCHIMPF    Your procedure is scheduled on: 04/25/20   Report to [facility] Main  Entrance   Report to short stay at 5:30 AM     Call this number if you have problems the morning of surgery [phone]    Remember: Do not eat food or drink liquids :After Midnight.   BRUSH YOUR TEETH MORNING OF SURGERY AND RINSE YOUR MOUTH OUT, NO CHEWING GUM CANDY OR MINTS.     Take these medicines the morning of surgery with A SIP OF WATER: none                                 You may not have any metal on your body including hair pins and              piercings  Do not wear jewelry, make-up, lotions, powders or perfumes, deodorant             Do not wear nail polish on your fingernails.  Do not shave  48 hours prior to surgery.              Men may shave face and neck.   Do not bring valuables to the hospital. [place].  Contacts, dentures or bridgework may not be worn into surgery.      Patients discharged the day of surgery will not be allowed to drive home. IF YOU ARE HAVING SURGERY AND GOING HOME THE SAME DAY, YOU MUST HAVE AN ADULT TO DRIVE YOU HOME AND BE WITH YOU FOR 24 HOURS. YOU MAY GO HOME BY TAXI OR UBER OR ORTHERWISE, BUT AN ADULT MUST ACCOMPANY YOU HOME AND STAY WITH YOU FOR 24 HOURS.  Name and  phone number of your driver:  Special Instructions: N/A              Please read over the following fact sheets you were given: _____________________________________________________________________             [facility] - Preparing for Surgery Before surgery, you can play an important role.  Because skin is not sterile, your skin needs to be as free of germs as possible.  You can reduce the number of germs on your skin by washing with CHG (chlorahexidine gluconate) soap before surgery.  CHG is an antiseptic cleaner which kills germs and bonds with the skin to continue killing germs even after washing. Please DO NOT use if you have an allergy to CHG or antibacterial soaps.  If your skin becomes reddened/irritated stop using the CHG and inform your nurse  when you arrive at Short Stay. Do not shave (including legs and underarms) for at least 48 hours prior to the first CHG shower.   Please follow these instructions carefully:  1.  Shower with CHG Soap the night before surgery and the  morning of Surgery.  2.  If you choose to wash your hair, wash your hair first as usual with your  normal  shampoo.  3.  After you shampoo, rinse your hair and body thoroughly to remove the  shampoo.                                        4.  Use CHG as you would any other liquid soap.  You can apply chg directly  to the skin and wash                       Gently with a scrungie or clean washcloth.  5.  Apply the CHG Soap to your body ONLY FROM THE NECK DOWN.   Do not use on face/ open                           Wound or open sores. Avoid contact with eyes, ears mouth and genitals (private parts).                       Wash face,  Genitals (private parts) with your normal soap.             6.  Wash thoroughly, paying special attention to the area where your surgery  will be performed.  7.  Thoroughly rinse your body with warm water from the neck down.  8.  DO NOT shower/wash with your normal soap after using and  rinsing off  the CHG Soap.             9.  Pat yourself dry with a clean towel.            10.  Wear clean pajamas.            11.  Place clean sheets on your bed the night of your first shower and do not  sleep with pets. Day of Surgery : Do not apply any lotions/deodorants the morning of surgery.  Please wear clean clothes to the hospital/surgery center.  FAILURE TO FOLLOW THESE INSTRUCTIONS MAY RESULT IN THE CANCELLATION OF YOUR SURGERY PATIENT SIGNATURE_________________________________  NURSE SIGNATURE__________________________________  ________________________________________________________________________

## 2020-04-19 ENCOUNTER — Encounter (HOSPITAL_COMMUNITY): Payer: Self-pay

## 2020-04-19 ENCOUNTER — Ambulatory Visit: Payer: Self-pay | Admitting: Surgery

## 2020-04-19 ENCOUNTER — Other Ambulatory Visit: Payer: Self-pay

## 2020-04-19 ENCOUNTER — Encounter (HOSPITAL_COMMUNITY)
Admission: RE | Admit: 2020-04-19 | Discharge: 2020-04-19 | Disposition: A | Discharge status: Home / Self Care | Payer: BC Managed Care – PPO | Source: Ambulatory Visit | Attending: Surgery | Admitting: Surgery

## 2020-04-19 DIAGNOSIS — C50912 Malignant neoplasm of unspecified site of left female breast: Secondary | ICD-10-CM | POA: Diagnosis not present

## 2020-04-19 NOTE — H&P (Signed)
[name] Appointment: 04/19/2020 9:40 AM Location: [facility] Patient #: [id number] DOB: 1961/02/18 Married / Language: [name] / Race: White Female  History of Present Illness [name] A. Aidden Markovic MD; 04/19/2020 1:01 PM) Patient words: Patient returns for follow-up with a new diagnosis of recurrent left breast cancer. She was treated in 2020 for negative invasive ductal carcinoma stage 2 left breast with neoadjuvant chemotherapy, breast conserving surgery and subsequent radiation therapy. She was found on recent mammography to have a 2 cm mass left breast core biopsy proven to be grade 3 invasive ductal carcinoma triple negative. She is in need of port for chemotherapy and will require mastectomy once treatment is complete. She has no complaints today.  The patient is a 60 year old female.   Allergies Janeann Forehand, CNA; 04/19/2020 9:28 AM) No Known Allergies [04/11/2018]: No Known Drug Allergies [06/09/2019]: Allergies Reconciled  Medication History Janeann Forehand, CNA; 04/19/2020 9:28 AM) Capecitabine (500MG  Tablet, Oral) Active. Colace (100MG  Capsule, Oral) Active. Cyclobenzaprine HCl (10MG  Tablet, Oral) Active. Ondansetron HCl (4MG  Tablet, Oral) Active. Claritin (Oral) Specific strength unknown - Active. Medications Reconciled     Physical Exam (Rockell Faulks A. Aariona Momon MD; 04/19/2020 1:01 PM)  Breast Note: Left breast status post surgery and radiation changes. There is some fullness in the left upper quadrant which is nondescript. Right breast normal. Scar from port on the right noted.  Lymphatic Axillary  General Axillary Region: Bilateral - Description - Normal. Tenderness - Non Tender.    Assessment & Plan (Nasia Cannan A. Terral Cooks MD; 04/19/2020 1:02 PM)  RECURRENT BREAST CANCER, LEFT (C50.912) Impression: triple negative port placement mastectomy at a later time Pt requires port placement for chemotherapy. Risk include bleeding, infection,  pneumothorax, hemothorax, mediastinal injury, nerve injury , blood vessel injury, strke, blood clots, death, migration. embolization and need for additional procedures. Pt agrees to proceed.  Current Plans Use of a central venous catheter for intravenous therapy was discussed. Technique of catheter placement using ultrasound and fluoroscopy guidance was discussed. Risks such as bleeding, infection, pneumothorax, catheter occlusion, reoperation, and other risks were discussed. I noted a good likelihood this will help address the problem. Questions were answered. The patient expressed understanding & wishes to proceed.

## 2020-04-19 NOTE — H&P (View-Only) (Signed)
[name] Appointment: 04/19/2020 9:40 AM Location: [facility] Patient #: [id number] DOB: 1960-06-15 Married / Language: [name] / Race: White Female  History of Present Illness [name] A. Dusty Raczkowski MD; 04/19/2020 1:01 PM) Patient words: Patient returns for follow-up with a new diagnosis of recurrent left breast cancer. She was treated in 2020 for negative invasive ductal carcinoma stage 2 left breast with neoadjuvant chemotherapy, breast conserving surgery and subsequent radiation therapy. She was found on recent mammography to have a 2 cm mass left breast core biopsy proven to be grade 3 invasive ductal carcinoma triple negative. She is in need of port for chemotherapy and will require mastectomy once treatment is complete. She has no complaints today.  The patient is a 60 year old female.   Allergies Janeann Forehand, CNA; 04/19/2020 9:28 AM) No Known Allergies [04/11/2018]: No Known Drug Allergies [06/09/2019]: Allergies Reconciled  Medication History Janeann Forehand, CNA; 04/19/2020 9:28 AM) Capecitabine (500MG  Tablet, Oral) Active. Colace (100MG  Capsule, Oral) Active. Cyclobenzaprine HCl (10MG  Tablet, Oral) Active. Ondansetron HCl (4MG  Tablet, Oral) Active. Claritin (Oral) Specific strength unknown - Active. Medications Reconciled     Physical Exam (Kaydense Rizo A. Haely Leyland MD; 04/19/2020 1:01 PM)  Breast Note: Left breast status post surgery and radiation changes. There is some fullness in the left upper quadrant which is nondescript. Right breast normal. Scar from port on the right noted.  Lymphatic Axillary  General Axillary Region: Bilateral - Description - Normal. Tenderness - Non Tender.    Assessment & Plan (Rilyn Scroggs A. Trevyn Lumpkin MD; 04/19/2020 1:02 PM)  RECURRENT BREAST CANCER, LEFT (C50.912) Impression: triple negative port placement mastectomy at a later time Pt requires port placement for chemotherapy. Risk include bleeding, infection,  pneumothorax, hemothorax, mediastinal injury, nerve injury , blood vessel injury, strke, blood clots, death, migration. embolization and need for additional procedures. Pt agrees to proceed.  Current Plans Use of a central venous catheter for intravenous therapy was discussed. Technique of catheter placement using ultrasound and fluoroscopy guidance was discussed. Risks such as bleeding, infection, pneumothorax, catheter occlusion, reoperation, and other risks were discussed. I noted a good likelihood this will help address the problem. Questions were answered. The patient expressed understanding & wishes to proceed.

## 2020-04-19 NOTE — Progress Notes (Signed)
COVID Vaccine Completed:yes Date COVID Vaccine completed: COVID vaccine manufacturer: [employer]   PCP -  Dr Chauncey Cruel. [place] Cardiologist -none  Chest x-ray - no EKG - no Stress Test - no ECHO - no Cardiac Cath - no Pacemaker/ICD device last checked:NA  Sleep Study - no CPAP -   Fasting Blood Sugar - NA Checks Blood Sugar _____ times a day  Blood Thinner Instructions:NA Aspirin Instructions: Last Dose:  Anesthesia review:   Patient denies shortness of breath, fever, cough and chest pain at PAT appointment  yes Patient verbalized understanding of instructions that were given to them at the PAT appointment. Patient was also instructed that they will need to review over the PAT instructions again at home before surgery.Yes pt has no SOB with any activities.

## 2020-04-22 ENCOUNTER — Encounter: Payer: Self-pay | Admitting: Oncology

## 2020-04-22 ENCOUNTER — Encounter: Payer: Self-pay | Admitting: *Deleted

## 2020-04-22 ENCOUNTER — Other Ambulatory Visit (HOSPITAL_COMMUNITY)
Admission: RE | Admit: 2020-04-22 | Discharge: 2020-04-22 | Disposition: A | Discharge status: Home / Self Care | Payer: BC Managed Care – PPO | Source: Ambulatory Visit | Attending: Surgery | Admitting: Surgery

## 2020-04-22 DIAGNOSIS — Z20822 Contact with and (suspected) exposure to covid-19: Secondary | ICD-10-CM | POA: Insufficient documentation

## 2020-04-22 DIAGNOSIS — Z01812 Encounter for preprocedural laboratory examination: Secondary | ICD-10-CM | POA: Insufficient documentation

## 2020-04-22 DIAGNOSIS — Z9221 Personal history of antineoplastic chemotherapy: Secondary | ICD-10-CM | POA: Diagnosis not present

## 2020-04-22 DIAGNOSIS — C50912 Malignant neoplasm of unspecified site of left female breast: Secondary | ICD-10-CM | POA: Diagnosis not present

## 2020-04-22 DIAGNOSIS — Z79899 Other long term (current) drug therapy: Secondary | ICD-10-CM | POA: Diagnosis not present

## 2020-04-22 DIAGNOSIS — Z923 Personal history of irradiation: Secondary | ICD-10-CM | POA: Diagnosis not present

## 2020-04-22 NOTE — Progress Notes (Signed)
Patient presented to COVID testing site for pre-procedural testing. According to patient, she had a positive at-home COVID test on 02/17/2020. COVID test completed per standing orders.

## 2020-04-23 ENCOUNTER — Inpatient Hospital Stay: Payer: BC Managed Care – PPO

## 2020-04-23 LAB — SARS CORONAVIRUS 2 (TAT 6-24 HRS): SARS Coronavirus 2: NEGATIVE

## 2020-04-23 NOTE — Progress Notes (Signed)
The following biosimilar Udenyca (pegfilgrastim-[id number]) has been selected for use in this patient.  Kennith Center, Pharm.D., CPP 04/23/2020@11 :48 AM   Pharmacist Chemotherapy Monitoring - Initial Assessment    Anticipated start date: 04/30/20   Regimen:  . Are orders appropriate based on the patient's diagnosis, regimen, and cycle? Yes . Does the plan date match the patient's scheduled date? Yes . Is the sequencing of drugs appropriate? Yes . Are the premedications appropriate for the patient's regimen? Yes . Prior Authorization for treatment is: Approved o If applicable, is the correct biosimilar selected based on the patient's insurance? yes  Organ Function and Labs: Marland Kitchen Are dose adjustments needed based on the patient's renal function, hepatic function, or hematologic function? No . Are appropriate labs ordered prior to the start of patient's treatment? Yes . Other organ system assessment, if indicated: N/A . The following baseline labs, if indicated, have been ordered: N/A  Dose Assessment: . Are the drug doses appropriate? Yes . Are the following correct: o Drug concentrations Yes o IV fluid compatible with drug Yes o Administration routes Yes o Timing of therapy Yes . If applicable, does the patient have documented access for treatment and/or plans for port-a-cath placement? yes . If applicable, have lifetime cumulative doses been properly documented and assessed? yes Lifetime Dose Tracking  . Doxorubicin: 221.945 mg/m2 (396 mg) = 49.32 % of the maximum lifetime dose of 450 mg/m2  . Carboplatin: 920 mg = 0.01 % of the maximum lifetime dose of 999,999,999 mg  o   Toxicity Monitoring/Prevention: . The patient has the following take home antiemetics prescribed: Prochlorperazine and Dexamethasone . The patient has the following take home medications prescribed: N/A . Medication allergies and previous infusion related reactions, if applicable, have been reviewed and addressed.  Yes . The patient's current medication list has been assessed for drug-drug interactions with their chemotherapy regimen. no significant drug-drug interactions were identified on review.  Order Review: . Are the treatment plan orders signed? Yes . Is the patient scheduled to see a provider prior to their treatment? Yes  I verify that I have reviewed each item in the above checklist and answered each question accordingly.   Kennith Center, Pharm.D., CPP 04/23/2020@12 :32 PM

## 2020-04-24 ENCOUNTER — Encounter: Payer: Self-pay | Admitting: Oncology

## 2020-04-24 NOTE — Anesthesia Preprocedure Evaluation (Addendum)
Anesthesia Evaluation  Patient identified by MRN, date of birth, ID band Patient awake    Reviewed: Allergy & Precautions, NPO status , Patient's Chart, lab work & pertinent test results  Airway Mallampati: I  TM Distance: >3 FB Neck ROM: Full    Dental no notable dental hx.    Pulmonary neg pulmonary ROS,    Pulmonary exam normal breath sounds clear to auscultation       Cardiovascular negative cardio ROS Normal cardiovascular exam Rhythm:Regular Rate:Normal     Neuro/Psych  Headaches, negative psych ROS   GI/Hepatic negative GI ROS, Neg liver ROS,   Endo/Other  radiation therapy   chemotherapy         Renal/GU negative Renal ROS     Musculoskeletal negative musculoskeletal ROS (+)   Abdominal   Peds  Hematology negative hematology ROS (+)   Anesthesia Other Findings Breast cancer  Reproductive/Obstetrics                            Anesthesia Physical Anesthesia Plan  ASA: II  Anesthesia Plan: General   Post-op Pain Management:    Induction: Intravenous  PONV Risk Score and Plan: 3 and Ondansetron, Dexamethasone, Midazolam and Treatment may vary due to age or medical condition  Airway Management Planned: LMA  Additional Equipment:   Intra-op Plan:   Post-operative Plan: Extubation in OR  Informed Consent: I have reviewed the patients History and Physical, chart, labs and discussed the procedure including the risks, benefits and alternatives for the proposed anesthesia with the patient or authorized representative who has indicated his/her understanding and acceptance.     Dental advisory given  Plan Discussed with: CRNA  Anesthesia Plan Comments:         Anesthesia Quick Evaluation

## 2020-04-25 ENCOUNTER — Encounter (HOSPITAL_COMMUNITY)
Admission: RE | Disposition: A | Discharge status: Home / Self Care | Payer: Self-pay | Source: Home / Self Care | Attending: Surgery

## 2020-04-25 ENCOUNTER — Ambulatory Visit (HOSPITAL_COMMUNITY): Payer: BC Managed Care – PPO

## 2020-04-25 ENCOUNTER — Ambulatory Visit (HOSPITAL_COMMUNITY): Payer: BC Managed Care – PPO | Admitting: Anesthesiology

## 2020-04-25 ENCOUNTER — Encounter (HOSPITAL_COMMUNITY): Payer: Self-pay | Admitting: Surgery

## 2020-04-25 ENCOUNTER — Ambulatory Visit (HOSPITAL_COMMUNITY)
Admission: RE | Admit: 2020-04-25 | Discharge: 2020-04-25 | Disposition: A | Discharge status: Home / Self Care | Payer: BC Managed Care – PPO | Attending: Surgery | Admitting: Surgery

## 2020-04-25 DIAGNOSIS — Z20822 Contact with and (suspected) exposure to covid-19: Secondary | ICD-10-CM | POA: Diagnosis not present

## 2020-04-25 DIAGNOSIS — Z923 Personal history of irradiation: Secondary | ICD-10-CM | POA: Diagnosis not present

## 2020-04-25 DIAGNOSIS — C50912 Malignant neoplasm of unspecified site of left female breast: Secondary | ICD-10-CM | POA: Diagnosis not present

## 2020-04-25 DIAGNOSIS — Z79899 Other long term (current) drug therapy: Secondary | ICD-10-CM | POA: Insufficient documentation

## 2020-04-25 DIAGNOSIS — C50412 Malignant neoplasm of upper-outer quadrant of left female breast: Secondary | ICD-10-CM | POA: Diagnosis not present

## 2020-04-25 DIAGNOSIS — Z9221 Personal history of antineoplastic chemotherapy: Secondary | ICD-10-CM | POA: Insufficient documentation

## 2020-04-25 DIAGNOSIS — Z95828 Presence of other vascular implants and grafts: Secondary | ICD-10-CM

## 2020-04-25 DIAGNOSIS — Z452 Encounter for adjustment and management of vascular access device: Secondary | ICD-10-CM | POA: Diagnosis not present

## 2020-04-25 DIAGNOSIS — Z171 Estrogen receptor negative status [ER-]: Secondary | ICD-10-CM | POA: Diagnosis not present

## 2020-04-25 HISTORY — PX: PORTACATH PLACEMENT: SHX2246

## 2020-04-25 SURGERY — INSERTION, TUNNELED CENTRAL VENOUS DEVICE, WITH PORT
Anesthesia: General

## 2020-04-25 MED ORDER — FENTANYL CITRATE (PF) 100 MCG/2ML IJ SOLN: 25.0000 ug | INTRAMUSCULAR | Status: DC | PRN

## 2020-04-25 MED ORDER — EPHEDRINE SULFATE-NACL 50-0.9 MG/10ML-% IV SOSY
PREFILLED_SYRINGE | INTRAVENOUS | Status: DC | PRN
  Administered 2020-04-25: 5 mg via INTRAVENOUS
  Administered 2020-04-25: 10 mg via INTRAVENOUS

## 2020-04-25 MED ORDER — CHLORHEXIDINE GLUCONATE CLOTH 2 % EX PADS: 6.0000 | MEDICATED_PAD | Freq: Once | CUTANEOUS | Status: DC

## 2020-04-25 MED ORDER — FENTANYL CITRATE (PF) 100 MCG/2ML IJ SOLN
INTRAMUSCULAR | Status: DC | PRN
  Administered 2020-04-25: 50 ug via INTRAVENOUS

## 2020-04-25 MED ORDER — KETOROLAC TROMETHAMINE 30 MG/ML IJ SOLN: 30.0000 mg | Freq: Once | INTRAMUSCULAR | Status: DC | PRN

## 2020-04-25 MED ORDER — DEXAMETHASONE SODIUM PHOSPHATE 10 MG/ML IJ SOLN
INTRAMUSCULAR | Status: AC
  Filled 2020-04-25: qty 1

## 2020-04-25 MED ORDER — PROMETHAZINE HCL 25 MG/ML IJ SOLN: 6.2500 mg | INTRAMUSCULAR | Status: DC | PRN

## 2020-04-25 MED ORDER — IBUPROFEN 800 MG PO TABS: 800.0000 mg | ORAL_TABLET | Freq: Three times a day (TID) | ORAL | 0 refills | Status: DC | PRN

## 2020-04-25 MED ORDER — ONDANSETRON HCL 4 MG/2ML IJ SOLN
INTRAMUSCULAR | Status: AC
  Filled 2020-04-25: qty 2

## 2020-04-25 MED ORDER — MIDAZOLAM HCL 5 MG/5ML IJ SOLN
INTRAMUSCULAR | Status: DC | PRN
  Administered 2020-04-25: 2 mg via INTRAVENOUS

## 2020-04-25 MED ORDER — ONDANSETRON HCL 4 MG/2ML IJ SOLN
INTRAMUSCULAR | Status: DC | PRN
  Administered 2020-04-25: 4 mg via INTRAVENOUS

## 2020-04-25 MED ORDER — HEPARIN SOD (PORK) LOCK FLUSH 100 UNIT/ML IV SOLN
INTRAVENOUS | Status: DC | PRN
  Administered 2020-04-25: 500 [IU] via INTRAVENOUS

## 2020-04-25 MED ORDER — EPHEDRINE 5 MG/ML INJ
INTRAVENOUS | Status: AC
  Filled 2020-04-25: qty 10

## 2020-04-25 MED ORDER — HEPARIN SOD (PORK) LOCK FLUSH 100 UNIT/ML IV SOLN
INTRAVENOUS | Status: AC
  Filled 2020-04-25: qty 5

## 2020-04-25 MED ORDER — BUPIVACAINE-EPINEPHRINE 0.25% -1:200000 IJ SOLN
INTRAMUSCULAR | Status: DC | PRN
  Administered 2020-04-25: 15 mL

## 2020-04-25 MED ORDER — ACETAMINOPHEN 500 MG PO TABS
1000.0000 mg | ORAL_TABLET | Freq: Once | ORAL | Status: AC
  Administered 2020-04-25: 1000 mg via ORAL
  Filled 2020-04-25: qty 2

## 2020-04-25 MED ORDER — CHLORHEXIDINE GLUCONATE 0.12 % MT SOLN: 15.0000 mL | Freq: Once | OROMUCOSAL | Status: AC

## 2020-04-25 MED ORDER — FENTANYL CITRATE (PF) 100 MCG/2ML IJ SOLN
INTRAMUSCULAR | Status: AC
  Filled 2020-04-25: qty 2

## 2020-04-25 MED ORDER — OXYCODONE HCL 5 MG PO TABS: 5.0000 mg | ORAL_TABLET | Freq: Once | ORAL | Status: DC | PRN

## 2020-04-25 MED ORDER — LACTATED RINGERS IV SOLN: INTRAVENOUS | Status: DC

## 2020-04-25 MED ORDER — ORAL CARE MOUTH RINSE
15.0000 mL | Freq: Once | OROMUCOSAL | Status: AC
  Administered 2020-04-25: 15 mL via OROMUCOSAL

## 2020-04-25 MED ORDER — SODIUM CHLORIDE 0.9 % IV SOLN
Freq: Once | INTRAVENOUS | Status: AC
  Administered 2020-04-25: 09:00:00 500 mL
  Filled 2020-04-25: qty 1.2

## 2020-04-25 MED ORDER — BUPIVACAINE-EPINEPHRINE 0.25% -1:200000 IJ SOLN
INTRAMUSCULAR | Status: AC
  Filled 2020-04-25: qty 1

## 2020-04-25 MED ORDER — PROPOFOL 10 MG/ML IV BOLUS
INTRAVENOUS | Status: DC | PRN
  Administered 2020-04-25: 200 mg via INTRAVENOUS

## 2020-04-25 MED ORDER — 0.9 % SODIUM CHLORIDE (POUR BTL) OPTIME
TOPICAL | Status: DC | PRN
  Administered 2020-04-25: 1000 mL

## 2020-04-25 MED ORDER — PROPOFOL 10 MG/ML IV BOLUS
INTRAVENOUS | Status: AC
  Filled 2020-04-25: qty 20

## 2020-04-25 MED ORDER — CEFAZOLIN SODIUM-DEXTROSE 2-4 GM/100ML-% IV SOLN
2.0000 g | INTRAVENOUS | Status: AC
  Administered 2020-04-25: 2 g via INTRAVENOUS
  Filled 2020-04-25: qty 100

## 2020-04-25 MED ORDER — LIDOCAINE HCL (PF) 2 % IJ SOLN
INTRAMUSCULAR | Status: AC
  Filled 2020-04-25: qty 5

## 2020-04-25 MED ORDER — DEXAMETHASONE SODIUM PHOSPHATE 10 MG/ML IJ SOLN
INTRAMUSCULAR | Status: DC | PRN
  Administered 2020-04-25: 8 mg via INTRAVENOUS

## 2020-04-25 MED ORDER — AMISULPRIDE (ANTIEMETIC) 5 MG/2ML IV SOLN: 10.0000 mg | Freq: Once | INTRAVENOUS | Status: DC | PRN

## 2020-04-25 MED ORDER — OXYCODONE HCL 5 MG/5ML PO SOLN: 5.0000 mg | Freq: Once | ORAL | Status: DC | PRN

## 2020-04-25 MED ORDER — LIDOCAINE 2% (20 MG/ML) 5 ML SYRINGE
INTRAMUSCULAR | Status: DC | PRN
  Administered 2020-04-25: 60 mg via INTRAVENOUS

## 2020-04-25 MED ORDER — OXYCODONE HCL 5 MG PO TABS: 5.0000 mg | ORAL_TABLET | Freq: Four times a day (QID) | ORAL | 0 refills | Status: DC | PRN

## 2020-04-25 MED ORDER — MIDAZOLAM HCL 2 MG/2ML IJ SOLN
INTRAMUSCULAR | Status: AC
  Filled 2020-04-25: qty 2

## 2020-04-25 SURGICAL SUPPLY — 42 items
BAG DECANTER FOR FLEXI CONT (MISCELLANEOUS) ×2 IMPLANT
BENZOIN TINCTURE PRP APPL 2/3 (GAUZE/BANDAGES/DRESSINGS) ×2 IMPLANT
BLADE HEX COATED 2.75 (ELECTRODE) ×2 IMPLANT
BLADE SURG 15 STRL LF DISP TIS (BLADE) ×1 IMPLANT
BLADE SURG 15 STRL SS (BLADE) ×1
COVER WAND RF STERILE (DRAPES) IMPLANT
DECANTER SPIKE VIAL GLASS SM (MISCELLANEOUS) ×2 IMPLANT
DERMABOND ADVANCED (GAUZE/BANDAGES/DRESSINGS) ×1
DERMABOND ADVANCED .7 DNX12 (GAUZE/BANDAGES/DRESSINGS) ×1 IMPLANT
DRAPE C-ARM 42X120 X-RAY (DRAPES) ×2 IMPLANT
DRAPE LAPAROSCOPIC ABDOMINAL (DRAPES) ×2 IMPLANT
DRSG TEGADERM 2-3/8X2-3/4 SM (GAUZE/BANDAGES/DRESSINGS) IMPLANT
DRSG TEGADERM 4X4.75 (GAUZE/BANDAGES/DRESSINGS) IMPLANT
ELECT REM PT RETURN 15FT ADLT (MISCELLANEOUS) ×2 IMPLANT
GAUZE 4X4 16PLY RFD (DISPOSABLE) ×2 IMPLANT
GAUZE SPONGE 2X2 8PLY STRL LF (GAUZE/BANDAGES/DRESSINGS) IMPLANT
GAUZE SPONGE 4X4 12PLY STRL (GAUZE/BANDAGES/DRESSINGS) ×4 IMPLANT
GLOVE INDICATOR 8.0 STRL GRN (GLOVE) ×4 IMPLANT
GLOVE SURG LTX SZ8 (GLOVE) ×2 IMPLANT
GLOVE SURG UNDER POLY LF SZ7 (GLOVE) ×2 IMPLANT
GOWN STRL REUS W/TWL LRG LVL3 (GOWN DISPOSABLE) ×2 IMPLANT
GOWN STRL REUS W/TWL XL LVL3 (GOWN DISPOSABLE) ×4 IMPLANT
KIT BASIN OR (CUSTOM PROCEDURE TRAY) ×2 IMPLANT
KIT PORT POWER 8FR ISP CVUE (Port) ×2 IMPLANT
KIT TURNOVER KIT A (KITS) ×2 IMPLANT
NEEDLE HYPO 25X1 1.5 SAFETY (NEEDLE) ×2 IMPLANT
NS IRRIG 1000ML POUR BTL (IV SOLUTION) ×2 IMPLANT
PACK BASIC VI WITH GOWN DISP (CUSTOM PROCEDURE TRAY) ×2 IMPLANT
PENCIL SMOKE EVACUATOR (MISCELLANEOUS) IMPLANT
SPONGE GAUZE 2X2 STER 10/PKG (GAUZE/BANDAGES/DRESSINGS)
STRIP CLOSURE SKIN 1/2X4 (GAUZE/BANDAGES/DRESSINGS) IMPLANT
SUT MNCRL AB 4-0 PS2 18 (SUTURE) ×2 IMPLANT
SUT NOVA NAB DX-16 0-1 5-0 T12 (SUTURE) IMPLANT
SUT PROLENE 2 0 SH DA (SUTURE) ×2 IMPLANT
SUT SILK 2 0 (SUTURE)
SUT SILK 2-0 30XBRD TIE 12 (SUTURE) IMPLANT
SUT VIC AB 3-0 SH 27 (SUTURE)
SUT VIC AB 3-0 SH 27XBRD (SUTURE) IMPLANT
SYR 10ML LL (SYRINGE) ×2 IMPLANT
SYR BULB IRRIG 60ML STRL (SYRINGE) IMPLANT
SYR CONTROL 10ML LL (SYRINGE) ×2 IMPLANT
TOWEL OR 17X26 10 PK STRL BLUE (TOWEL DISPOSABLE) ×2 IMPLANT

## 2020-04-25 NOTE — Interval H&P Note (Signed)
History and Physical Interval Note:  04/25/2020 7:21 AM  Kathryn [name]  has presented today for surgery, with the diagnosis of breast cancer.  The various methods of treatment have been discussed with the patient and family. After consideration of risks, benefits and other options for treatment, the patient has consented to  Procedure(s) with comments: INSERTION PORT-A-CATH (N/A) - 60 MIN IN LDOW PLEASE as a surgical intervention.  The patient's history has been reviewed, patient examined, no change in status, stable for surgery.  I have reviewed the patient's chart and labs.  Questions were answered to the patient's satisfaction. The procedure has been discussed with the patient.  Alternative therapies have been discussed with the patient.  Operative risks include bleeding,  Infection,  Organ injury,  Nerve injury,  Blood vessel injury,  DVT, lung collapse injury to mediastinal structures  catheter migration,  Pulmonary embolism,  Death,  And possible reoperation.  Medical management risks include worsening of present situation.  The success of the procedure is 50 -90 % at treating patients symptoms.  The patient understands and agrees to proceed.    [place]

## 2020-04-25 NOTE — Anesthesia Procedure Notes (Signed)
Procedure Name: LMA Insertion Date/Time: 04/25/2020 7:32 AM Performed by: Victoriano Lain, CRNA Pre-anesthesia Checklist: Patient identified, Emergency Drugs available, Suction available, Patient being monitored and Timeout performed Patient Re-evaluated:Patient Re-evaluated prior to induction Oxygen Delivery Method: Circle system utilized Preoxygenation: Pre-oxygenation with 100% oxygen Induction Type: IV induction Ventilation: Mask ventilation without difficulty LMA: LMA with gastric port inserted LMA Size: 4.0 Number of attempts: 1 Placement Confirmation: positive ETCO2 and breath sounds checked- equal and bilateral Tube secured with: Tape Dental Injury: Teeth and Oropharynx as per pre-operative assessment

## 2020-04-25 NOTE — Anesthesia Postprocedure Evaluation (Signed)
Anesthesia Post Note  Patient: Kathryn [name]  Procedure(s) Performed: INSERTION PORT-A-CATH (N/A )     Patient location during evaluation: PACU Anesthesia Type: General Level of consciousness: awake Pain management: pain level controlled Vital Signs Assessment: post-procedure vital signs reviewed and stable Respiratory status: spontaneous breathing, nonlabored ventilation, respiratory function stable and patient connected to nasal cannula oxygen Cardiovascular status: blood pressure returned to baseline and stable Postop Assessment: no apparent nausea or vomiting Anesthetic complications: no   No complications documented.  Last Vitals:  Vitals:   04/25/20 0933 04/25/20 0945  BP: 119/76   Pulse: 86   Resp: 19 18  Temp: (!) 36.1 C   SpO2: 100%     Last Pain:  Vitals:   04/25/20 0945  TempSrc:   PainSc: 0-No pain                 Nichole Neyer P Madinah Quarry

## 2020-04-25 NOTE — Discharge Instructions (Signed)
General Anesthesia, Adult, Care After This sheet gives you information about how to care for yourself after your procedure. Your health care provider may also give you more specific instructions. If you have problems or questions, contact your health care provider. What can I expect after the procedure? After the procedure, the following side effects are common:  Pain or discomfort at the IV site.  Nausea.  Vomiting.  Sore throat.  Trouble concentrating.  Feeling cold or chills.  Feeling weak or tired.  Sleepiness and fatigue.  Soreness and body aches. These side effects can affect parts of the body that were not involved in surgery. Follow these instructions at home: For the time period you were told by your health care provider:  Rest.  Do not participate in activities where you could fall or become injured.  Do not drive or use machinery.  Do not drink alcohol.  Do not take sleeping pills or medicines that cause drowsiness.  Do not make important decisions or sign legal documents.  Do not take care of children on your own.   Eating and drinking  Follow any instructions from your health care provider about eating or drinking restrictions.  When you feel hungry, start by eating small amounts of foods that are soft and easy to digest (bland), such as toast. Gradually return to your regular diet.  Drink enough fluid to keep your urine pale yellow.  If you vomit, rehydrate by drinking water, juice, or clear broth. General instructions  If you have sleep apnea, surgery and certain medicines can increase your risk for breathing problems. Follow instructions from your health care provider about wearing your sleep device: ? Anytime you are sleeping, including during daytime naps. ? While taking prescription pain medicines, sleeping medicines, or medicines that make you drowsy.  Have a responsible adult stay with you for the time you are told. It is important to have  someone help care for you until you are awake and alert.  Return to your normal activities as told by your health care provider. Ask your health care provider what activities are safe for you.  Take over-the-counter and prescription medicines only as told by your health care provider.  If you smoke, do not smoke without supervision.  Keep all follow-up visits as told by your health care provider. This is important. Contact a health care provider if:  You have nausea or vomiting that does not get better with medicine.  You cannot eat or drink without vomiting.  You have pain that does not get better with medicine.  You are unable to pass urine.  You develop a skin rash.  You have a fever.  You have redness around your IV site that gets worse. Get help right away if:  You have difficulty breathing.  You have chest pain.  You have blood in your urine or stool, or you vomit blood. Summary  After the procedure, it is common to have a sore throat or nausea. It is also common to feel tired.  Have a responsible adult stay with you for the time you are told. It is important to have someone help care for you until you are awake and alert.  When you feel hungry, start by eating small amounts of foods that are soft and easy to digest (bland), such as toast. Gradually return to your regular diet.  Drink enough fluid to keep your urine pale yellow.  Return to your normal activities as told by your health care provider.   Ask your health care provider what activities are safe for you. This information is not intended to replace advice given to you by your health care provider. Make sure you discuss any questions you have with your health care provider. Document Revised: 10/26/2019 Document Reviewed: 05/25/2019 Elsevier Patient Education  2021 [place]: POST OP INSTRUCTIONS  Always review your discharge instruction sheet given to you by the facility where your  surgery was performed.   1. A prescription for pain medication may be given to you upon discharge. Take your pain medication as prescribed, if needed. If narcotic pain medicine is not needed, then you make take acetaminophen (Tylenol) or ibuprofen (Advil) as needed.  2. Take your usually prescribed medications unless otherwise directed. 3. If you need a refill on your pain medication, please contact our office. All narcotic pain medicine now requires a paper prescription.  Phoned in and fax refills are no longer allowed by law.  Prescriptions will not be filled after 5 pm or on weekends.  4. You should follow a light diet for the remainder of the day after your procedure. 5. Most patients will experience some mild swelling and/or bruising in the area of the incision. It may take several days to resolve. 6. It is common to experience some constipation if taking pain medication after surgery. Increasing fluid intake and taking a stool softener (such as Colace) will usually help or prevent this problem from occurring. A mild laxative (Milk of Magnesia or Miralax) should be taken according to package directions if there are no bowel movements after 48 hours.  7. Unless discharge instructions indicate otherwise, you may remove your bandages 48 hours after surgery, and you may shower at that time. You may have steri-strips (small white skin tapes) in place directly over the incision.  These strips should be left on the skin for 7-10 days.  If your surgeon used Dermabond (skin glue) on the incision, you may shower in 24 hours.  The glue will flake off over the next 2-3 weeks.  8. If your port is left accessed at the end of surgery (needle left in port), the dressing cannot get wet and should only by changed by a healthcare professional. When the port is no longer accessed (when the needle has been removed), follow step 7.   9. ACTIVITIES:  Limit activity involving your arms for the next 72 hours. Do no strenuous  exercise or activity for 1 week. You may drive when you are no longer taking prescription pain medication, you can comfortably wear a seatbelt, and you can maneuver your car. 10.You may need to see your doctor in the office for a follow-up appointment.  Please       check with your doctor.  11.When you receive a new Port-a-Cath, you will get a product guide and        ID card.  Please keep them in case you need them.  WHEN TO CALL YOUR DOCTOR [phone]): 1. Fever over 101.0 2. Chills 3. Continued bleeding from incision 4. Increased redness and tenderness at the site 5. Shortness of breath, difficulty breathing   The clinic staff is available to answer your questions during regular business hours. Please don't hesitate to call and ask to speak to one of the nurses or medical assistants for clinical concerns. If you have a medical emergency, go to the nearest emergency room or call 911.  A surgeon from [facility] is always on call at  the hospital.     For further information, please visit [email or web address]

## 2020-04-25 NOTE — Op Note (Signed)
preoperative diagnosis: PAC needed for chemotherapy / recurrent breast   Postoperative diagnosis: Same  Procedure: Portacath Placement with U/S and C arm guidance   Surgeon: Turner Daniels, MD, FACS  Anesthesia: General and 0.25 % marcaine with epinephrine  Clinical History and Indications: The patient is getting ready to begin chemotherapy for her cancer. She  needs a Port-A-Cath for venous access. Risk of bleeding, infection,  Collapse lung,  Death,  DVT,  Organ injury,  Mediastinal injury,  Injury to heart,  Injury to blood vessels,  Nerves,  Migration of catheter,  Embolization of catheter and the need for more surgery.  Description of Procedure: I have seen the patient in the holding area and confirmed the plans for the procedure as noted above. I reviewed the risks and complications again and the patient has no further questions. She wishes to proceed.   The patient was then taken to the operating room. After satisfactory general  anesthesia had been obtained the upper chest and lower neck were prepped and draped as a sterile field. The timeout was done.  The right internal jugular vein  was entered under U/S guidance  and the guidewire threaded into the superior vena cava right atrial area under fluoroscopic guidance. An incision was then made on the anterior chest wall and a subcutaneous pocket fashioned for the port reservoir.  The port tubing was then brought through a subcutaneous tunnel from the port site to the guidewire site.  The port and catheter were attached, locked  and flushed. The catheter was measured and cut to appropriate length.The dilator and peel-away sheath were then advanced over the guidewire while monitoring this with fluoroscopy. The guidewire and dilator were removed and the tubing threaded to approximately 20 cm. The peel-away sheath was then removed. The catheter aspirated and flushed easily. Using fluoroscopy the tip was in the superior vena cava right atrial  junction area. It aspirated and flushed easily. That aspirated and flushed easily.  The reservoir was secured to the fascia with 2 sutures of 2-0 Prolene. A final check with fluoroscopy was done to make sure we had no kinks and good positioning of the tip of the catheter. Everything appeared to be okay. The catheter was aspirated, flushed with dilute heparin and then concentrated aqueous heparin.  The incision was then closed with interrupted 3-0 Vicryl, and 4-0 Monocryl subcuticular with Dermabond on the skin.  There were no operative complications. Estimated blood loss was minimal. All counts were correct. The patient tolerated the procedure well.  Turner Daniels, MD, FACS

## 2020-04-25 NOTE — Transfer of Care (Signed)
Immediate Anesthesia Transfer of Care Note  Patient: Kathryn [name]  Procedure(s) Performed: INSERTION PORT-A-CATH (N/A )  Patient Location: PACU  Anesthesia Type:General  Level of Consciousness: awake, alert , oriented and patient cooperative  Airway & Oxygen Therapy: Patient Spontanous Breathing and Patient connected to face mask oxygen  Post-op Assessment: Report given to RN, Post -op Vital signs reviewed and stable and Patient moving all extremities  Post vital signs: Reviewed and stable  Last Vitals:  Vitals Value Taken Time  BP 134/80 04/25/20 0846  Temp    Pulse 107 04/25/20 0848  Resp 15 04/25/20 0848  SpO2 100 % 04/25/20 0848  Vitals shown include unvalidated device data.  Last Pain:  Vitals:   04/25/20 0548  TempSrc: Oral         Complications: No complications documented.

## 2020-04-26 ENCOUNTER — Ambulatory Visit (HOSPITAL_COMMUNITY)
Admission: RE | Admit: 2020-04-26 | Discharge: 2020-04-26 | Disposition: A | Discharge status: Home / Self Care | Payer: BC Managed Care – PPO | Source: Ambulatory Visit | Attending: Oncology | Admitting: Oncology

## 2020-04-26 ENCOUNTER — Encounter (HOSPITAL_COMMUNITY)
Admission: RE | Admit: 2020-04-26 | Discharge: 2020-04-26 | Disposition: A | Discharge status: Home / Self Care | Payer: BC Managed Care – PPO | Source: Ambulatory Visit | Attending: Oncology | Admitting: Oncology

## 2020-04-26 ENCOUNTER — Encounter (HOSPITAL_COMMUNITY): Payer: Self-pay

## 2020-04-26 ENCOUNTER — Other Ambulatory Visit: Payer: Self-pay

## 2020-04-26 DIAGNOSIS — M810 Age-related osteoporosis without current pathological fracture: Secondary | ICD-10-CM | POA: Insufficient documentation

## 2020-04-26 DIAGNOSIS — C50919 Malignant neoplasm of unspecified site of unspecified female breast: Secondary | ICD-10-CM | POA: Diagnosis not present

## 2020-04-26 DIAGNOSIS — C50912 Malignant neoplasm of unspecified site of left female breast: Secondary | ICD-10-CM | POA: Diagnosis not present

## 2020-04-26 DIAGNOSIS — C50412 Malignant neoplasm of upper-outer quadrant of left female breast: Secondary | ICD-10-CM | POA: Diagnosis not present

## 2020-04-26 DIAGNOSIS — Z171 Estrogen receptor negative status [ER-]: Secondary | ICD-10-CM | POA: Insufficient documentation

## 2020-04-26 DIAGNOSIS — I700 Atherosclerosis of aorta: Secondary | ICD-10-CM | POA: Diagnosis not present

## 2020-04-26 DIAGNOSIS — K449 Diaphragmatic hernia without obstruction or gangrene: Secondary | ICD-10-CM | POA: Diagnosis not present

## 2020-04-26 MED ORDER — TECHNETIUM TC 99M MEDRONATE IV KIT
21.7000 | PACK | Freq: Once | INTRAVENOUS | Status: AC | PRN
  Administered 2020-04-26: 21.7 via INTRAVENOUS

## 2020-04-26 MED ORDER — IOHEXOL 300 MG/ML  SOLN
100.0000 mL | Freq: Once | INTRAMUSCULAR | Status: AC | PRN
  Administered 2020-04-26: 100 mL via INTRAVENOUS

## 2020-04-29 ENCOUNTER — Telehealth: Payer: Self-pay

## 2020-04-29 ENCOUNTER — Other Ambulatory Visit: Payer: Self-pay

## 2020-04-29 ENCOUNTER — Inpatient Hospital Stay: Payer: BC Managed Care – PPO | Attending: Oncology

## 2020-04-29 DIAGNOSIS — Z5111 Encounter for antineoplastic chemotherapy: Secondary | ICD-10-CM | POA: Insufficient documentation

## 2020-04-29 DIAGNOSIS — Z9221 Personal history of antineoplastic chemotherapy: Secondary | ICD-10-CM | POA: Insufficient documentation

## 2020-04-29 DIAGNOSIS — Z8051 Family history of malignant neoplasm of kidney: Secondary | ICD-10-CM | POA: Insufficient documentation

## 2020-04-29 DIAGNOSIS — Z5112 Encounter for antineoplastic immunotherapy: Secondary | ICD-10-CM | POA: Insufficient documentation

## 2020-04-29 DIAGNOSIS — Z923 Personal history of irradiation: Secondary | ICD-10-CM | POA: Insufficient documentation

## 2020-04-29 DIAGNOSIS — C50412 Malignant neoplasm of upper-outer quadrant of left female breast: Secondary | ICD-10-CM | POA: Insufficient documentation

## 2020-04-29 DIAGNOSIS — Z171 Estrogen receptor negative status [ER-]: Secondary | ICD-10-CM | POA: Insufficient documentation

## 2020-04-29 DIAGNOSIS — Z8249 Family history of ischemic heart disease and other diseases of the circulatory system: Secondary | ICD-10-CM | POA: Insufficient documentation

## 2020-04-29 DIAGNOSIS — Z79899 Other long term (current) drug therapy: Secondary | ICD-10-CM | POA: Insufficient documentation

## 2020-04-29 DIAGNOSIS — M810 Age-related osteoporosis without current pathological fracture: Secondary | ICD-10-CM | POA: Insufficient documentation

## 2020-04-29 DIAGNOSIS — Z801 Family history of malignant neoplasm of trachea, bronchus and lung: Secondary | ICD-10-CM | POA: Insufficient documentation

## 2020-04-29 NOTE — Progress Notes (Signed)
[place]  Telephone:(336) [phone] Fax:(336) [phone]    ID: Kathryn [name] DOB: 1960-09-13  MR#: [id number]  FUX#:[id number]  Patient Care Team: Kandace Blitz, MD as PCP - General (Obstetrics and Gynecology) Rockwell Germany, RN as Oncology Nurse Navigator Mauro Kaufmann, RN as Oncology Nurse Navigator Erroll Luna, MD as Consulting Physician (General Surgery) Abanoub Hanken, Virgie Dad, MD as Consulting Physician (Oncology) Kyung Rudd, MD as Consulting Physician (Radiation Oncology) OTHER MD: Rana Snare (OBGYN)   CHIEF COMPLAINT:  Recurrent triple negative breast cancer  CURRENT TREATMENT: Tneoadjuvant chemotherapy; zoledronate (Q 6 mos)   INTERVAL HISTORY: Lucill returns today for follow-up and treatment of her now recurrent triple negative breast cancer, accompanied by her husband [name]  Since her last visit, she underwent port placement on 04/25/2020.  She tolerated this with no significant complications.  She took 1 oxycodone which she did not like.  She is not constipated from this  She also underwent restaging CT chest, abdomen, pelvis on 04/26/2020 showing: left breast lesion compatible with known local breast cancer recurrence; no signs of nodal or distant metastatic disease.  She also underwent bone scan the same day showing: similar appearance of mild focal uptake within several scattered ribs, nonspecific and could be inflammatory in etiology; no new suspicious focal uptake.  She is scheduled to start neoadjuvant chemotherapy, consisting of cyclophosphamide and docetaxel every 3 weeks x 4, today.  She started zoledronate on 06/30/2019.  She tolerated that well except for hot flashes.  Most recent dose was 01/25/2020   REVIEW OF SYSTEMS: Vannie took Decadron yesterday as instructed.  Last night it was difficult for her to sleep.  This is partly nerves of course which is understandable but also the dexamethasone and we discussed that.  She has decided not to use the  Digna cap and she will be returning that for a full refund.  A detailed review of systems today was otherwise stable   COVID 19 VACCINATION STATUS: Not vaccinated; thinks she had COVID-19 in 2020; infection 02/23/2020 with complete recovery   HISTORY OF CURRENT ILLNESS: From the original intake note:  Kathryn [name] presented with a palpable subareolar left breast lump, nipple retraction, and discharge for approximately 1 week. She underwent bilateral diagnostic mammography with tomography and left breast ultrasonography at [facility] on 04/01/2018 showing: Breast Density Category C. An irregular hyperdense mass is seen in the left subareolar region. An additional oval, circumscribed mass is seen in the inferior central aspect at posterior depth. No additional suspicious findings are identified within the remainder of the left breast. On physical exam, there is a 2-3 cm firm, fixed lump in the subareolar region on the left. Subtle skin changes and retraction is noted along the left nipple. Targeted ultrasound is performed, showing an irregular hypoechoic mass with associated vascularity at the 12 o'clock retroareolar position on the left. Overall measurements are 2.6 x 2.3 x 1.2 cm. This corresponds with the mammographic finding. Two adjacent circumscribed hypoechoic masses are identified in the deep 6 o'clock position 2 cm from the nipple. They measure 0.8 x 0.6 x 0.4 cm and 1.3 x 1.1 x 0.7 cm. There is no seated vascularity. This corresponds with the additional mammographic finding and likely represents minimally complicated cyst. Evaluation of the left axilla demonstrates a markedly enlarged abnormal lymph node with complete hilar replacement. It measures up to 5 cm in long axis dimension. No suspicious mammographic findings on the right.   Accordingly on 04/06/2018 she proceeded to  biopsy of the left breast area in question. The pathology from this procedure showed ([id number]): invasive ductal  carcinoma, grade III, with lymphovascular invasion. Prognostic indicators significant for: estrogen receptor, 0% negative and progesterone receptor, 0% negative. Proliferation marker Ki67 at 70%. HER2 equivocal (2+) by immunohistochemistry, but negative by FISH (print3ed report pending).  On the same day she underwent a biopsy of the left axillary lymph node on 04/06/2018 showing ([id number]): metastatic carcinoma in 1 of 1 lymph node (1/1).   The patient's subsequent history is as detailed below.   PAST MEDICAL HISTORY: Past Medical History:  Diagnosis Date  . Breast cancer ([place])    stage 3 - left  . Family history of kidney cancer   . Family history of melanoma   . Headache    hormone related, none since 1st child was born   . Personal history of chemotherapy   . Personal history of radiation therapy   . Pleurisy     PAST SURGICAL HISTORY: Past Surgical History:  Procedure Laterality Date  . AXILLARY LYMPH NODE DISSECTION Left 10/11/2018   Procedure: LEFT AXILLARY LYMPH NODE DISSECTION;  Surgeon: Erroll Luna, MD;  Location: [facility];  Service: General;  Laterality: Left;  . BREAST LUMPECTOMY Left 08/2018  . BREAST LUMPECTOMY WITH RADIOACTIVE SEED AND SENTINEL LYMPH NODE BIOPSY Left 09/23/2018   Procedure: LEFT BREAST LUMPECTOMY WITH RADIOACTIVE SEED AND LEFT AXILLARY TARGETED LYMPH NODE BIOPY AND  LEFT AXILLARY SENTINEL LYMPH NODE MAPPING;  Surgeon: Erroll Luna, MD;  Location: [place] [facility];  Service: General;  Laterality: Left;  . CESAREAN SECTION     x3  . ORIF HUMERUS FRACTURE Right 01/31/2019   Procedure: OPEN REDUCTION INTERNAL FIXATION (ORIF) Right 3 part proximal humerus fracture;  Surgeon: Justice Britain, MD;  Location: [facility];  Service: Orthopedics;  Laterality: Right;  173mn  . PORT-A-CATH REMOVAL Right 07/18/2019   Procedure: REMOVAL PORT-A-CATH;  Surgeon: CErroll Luna MD;  [facility]  Service: General;  Laterality: Right;  .  PORTACATH PLACEMENT N/A 04/28/2018   Procedure: INSERTION PORT-A-CATH WITH ULTRASOUND;  Surgeon: CErroll Luna MD;  [facility]  Service: General;  Laterality: N/A;  . PORTACATH PLACEMENT N/A 04/25/2020   Procedure: INSERTION PORT-A-CATH;  Surgeon: CErroll Luna MD;  Location: [facility];  Service: General;  Laterality: N/A;  611MIN IN LDOW PLEASE    FAMILY HISTORY: Family History  Problem Relation Age of Onset  . Kidney cancer Sister 364      d. 375 . Lung cancer Paternal Uncle   . Melanoma Sister   . Melanoma Sister    Hatsuko's father died from congestive heart failure at age 58 Patients' mother is 974as of 03/2018. The patient has 1 brother and 3 sisters. Patient denies anyone in her family having breast, ovarian, prostate, or pancreatic cancer. Evee's sister, [name] was diagnosed with Kidney Cancer at 358 RAmielhas an uncle and a cousin that were diagnosed with lung cancer, but they were both heavy smokers.    GYNECOLOGIC HISTORY:  No LMP recorded. Patient is postmenopausal. 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  In 2021 she married Industrial/product designer who is working as a Building control surveyor.  Olive Bass lives in [place], and still marketing at [employer]. Mirielle Byrum is a Ship broker, and lives at home with [name] and [name].  The patient is expecting her first grandson in 2022  ADVANCED DIRECTIVES: Ayzia's husband, Lillyonna Armstead, is automatically her healthcare power of attorney.    HEALTH MAINTENANCE: Social History   Tobacco Use  . Smoking status: Never Smoker   . Smokeless tobacco: Never Used  Vaping Use  . Vaping Use: Never used  Substance Use Topics  . Alcohol use: Not Currently    Comment: rare  . Drug use: Never    Colonoscopy: no  PAP: 2015  Bone density: no   No Known Allergies  Current Outpatient Medications  Medication Sig Dispense Refill  . acetaminophen (TYLENOL) 500 MG tablet Take 1,000 mg by mouth every 6 (six) hours as needed (for pain.).    Marland Kitchen cholecalciferol (VITAMIN D3) 25 MCG (1000 UNIT) tablet Take 1 tablet (1,000 Units total) by mouth daily.    Marland Kitchen dexamethasone (DECADRON) 4 MG tablet Take 2 tablets (8 mg total) by mouth 2 (two) times daily. Start the day before Taxotere. Then again the day after chemo for 3 days. 30 tablet 1  . docusate sodium (COLACE) 100 MG capsule Take 100 mg by mouth daily as needed for mild constipation.    Marland Kitchen ibuprofen (ADVIL) 800 MG tablet Take 1 tablet (800 mg total) by mouth every 8 (eight) hours as needed. 30 tablet 0  . lidocaine-prilocaine (EMLA) cream Apply to affected area once (Patient taking differently: Apply 1 application topically daily as needed (port access).) 30 g 3  . loratadine (CLARITIN) 10 MG tablet Take 1 tablet (10 mg total) by mouth daily. (Patient taking differently: Take 10 mg by mouth daily as needed for allergies.) 60 tablet 1  . oxyCODONE (OXY IR/ROXICODONE) 5 MG immediate release tablet Take 1 tablet (5 mg total) by mouth every 6 (six) hours as needed for severe pain. 15 tablet 0  . prochlorperazine (COMPAZINE) 10 MG tablet Take 1 tablet (10 mg total) by mouth every 6 (six) hours as needed (Nausea or vomiting). 30 tablet 1   No current facility-administered medications for this visit.    OBJECTIVE: white woman who appears well  Vitals:   04/30/20 0844  BP: 138/87  Pulse: 97  Resp: 18  Temp: 97.9 F (36.6 C)  SpO2: 100%     Body mass index is 26.91 kg/m.   Wt Readings from Last 3 Encounters:  04/30/20 156 lb 12.8 oz (71.1 kg)  04/19/20 160 lb (72.6 kg)  04/16/20  161 lb 8 oz (73.3 kg)  ECOG FS:1  Sclerae unicteric, EOMs intact Wearing a mask No cervical or supraclavicular adenopathy Lungs no rales or rhonchi Heart regular rate and rhythm Abd soft, nontender, positive bowel sounds MSK no focal spinal tenderness, no upper extremity lymphedema Neuro: nonfocal, well oriented, appropriate affect Breasts: The right breast is benign.  The left breast is status post lumpectomy and radiation.  Both axillae are benign   LAB RESULTS:  CMP     Component Value Date/Time   NA 141 04/16/2020 1529   K 4.0 04/16/2020 1529   CL 107 04/16/2020 1529   CO2 25 04/16/2020 1529   GLUCOSE 98 04/16/2020 1529   BUN 10 04/16/2020 1529   CREATININE 0.70 04/16/2020 1529   CREATININE 0.62 08/02/2018 0834   CALCIUM  9.1 04/16/2020 1529   PROT 7.4 04/16/2020 1529   ALBUMIN 4.2 04/16/2020 1529   AST 18 04/16/2020 1529   AST 29 08/02/2018 0834   ALT 13 04/16/2020 1529   ALT 41 08/02/2018 0834   ALKPHOS 75 04/16/2020 1529   BILITOT 0.4 04/16/2020 1529   BILITOT 0.3 08/02/2018 0834   GFRNONAA >60 04/16/2020 1529   GFRNONAA >60 08/02/2018 0834   GFRAA >60 10/18/2019 1427   GFRAA >60 08/02/2018 0834    No results found for: TOTALPROTELP, ALBUMINELP, A1GS, A2GS, BETS, BETA2SER, GAMS, MSPIKE, SPEI  No results found for: KPAFRELGTCHN, LAMBDASER, [facility]  Lab Results  Component Value Date   WBC 6.5 04/16/2020   NEUTROABS 4.0 04/16/2020   HGB 13.1 04/16/2020   HCT 40.0 04/16/2020   MCV 94.6 04/16/2020   PLT 242 04/16/2020   No results found for: LABCA2  No components found for: [id number]  No results for input(s): INR in the last 168 hours.  No results found for: LABCA2  No results found for: CAN199  No results found for: FWY637  No results found for: CHY850  Lab Results  Component Value Date   CA2729 24.2 04/16/2020    No components found for: HGQUANT  No results found for: CEA1 / No results found for: CEA1   No results found for:  AFPTUMOR  No results found for: CHROMOGRNA  No results found for: HGBA, HGBA2QUANT, HGBFQUANT, HGBSQUAN (Hemoglobinopathy evaluation)   No results found for: LDH  No results found for: IRON, TIBC, IRONPCTSAT (Iron and TIBC)  No results found for: FERRITIN  Urinalysis No results found for: COLORURINE, APPEARANCEUR, LABSPEC, PHURINE, GLUCOSEU, HGBUR, BILIRUBINUR, KETONESUR, PROTEINUR, UROBILINOGEN, NITRITE, LEUKOCYTESUR   STUDIES:  CT Chest W Contrast  Result Date: 04/26/2020 CLINICAL DATA:  Restaging locally recurrent left breast cancer. EXAM: CT CHEST, ABDOMEN, AND PELVIS WITH CONTRAST TECHNIQUE: Multidetector CT imaging of the chest, abdomen and pelvis was performed following the standard protocol during bolus administration of intravenous contrast. CONTRAST:  129m OMNIPAQUE IOHEXOL 300 MG/ML  SOLN COMPARISON:  CT chest 04/19/2018 FINDINGS: CT CHEST FINDINGS Cardiovascular: Normal heart size. No pericardial effusion. Mild aortic atherosclerosis. Mediastinum/Nodes: Previous left axillary node dissection. No enlarged axillary, supraclavicular, mediastinal, or hilar lymph nodes. Normal appearance of the esophagus. The trachea appears patent and is midline. Normal appearance of the thyroid gland. Lungs/Pleura: No pleural effusion. No suspicious pulmonary nodule or mass. Musculoskeletal: Asymmetric left breast nodular soft tissue containing biopsy clips is noted and presumably reflects a combination of postsurgical change and recurrent tumor. No suspicious bone lesions identified. CT ABDOMEN PELVIS FINDINGS Hepatobiliary: No focal liver abnormality is seen. No gallstones, gallbladder wall thickening, or biliary dilatation. Pancreas: Unremarkable. No pancreatic ductal dilatation or surrounding inflammatory changes. Spleen: Normal in size without focal abnormality. Adrenals/Urinary Tract: Normal adrenal glands. No suspicious kidney mass or hydronephrosis. Urinary bladder appears normal. Stomach/Bowel:  Small hiatal hernia. No bowel wall thickening, inflammation or distension. Vascular/Lymphatic: Aortic atherosclerosis. No aneurysm. No abdominopelvic adenopathy. Reproductive: Uterus and bilateral adnexa are unremarkable. Other: No abdominal wall hernia or abnormality. No abdominopelvic ascites. Musculoskeletal: No acute or significant osseous findings. IMPRESSION: 1. Left breast lesion with surgical clip is identified compatible with known local breast cancer recurrence. 2. No signs of nodal or distant metastatic disease. 3. Small hiatal hernia. Aortic Atherosclerosis (ICD10-I70.0). Electronically Signed   By: TKerby MoorsM.D.   On: 04/26/2020 17:08   NM Bone Scan Whole Body  Result Date: 04/28/2020 CLINICAL DATA:  Breast cancer staging EXAM: NUCLEAR  MEDICINE WHOLE BODY BONE SCAN TECHNIQUE: Whole body anterior and posterior images were obtained approximately 3 hours after intravenous injection of radiopharmaceutical. RADIOPHARMACEUTICALS:  21.7 mCi Technetium-70mMDP IV COMPARISON:  April 26, 2020 CT, April 25, 2018 FINDINGS: There is similar mild focal uptake at the RIGHT lateral fifth rib, anterior LEFT fifth rib and anterior bilateral seventh ribs. No new suspicious focal uptake. There is radiotracer uptake within the sternoclavicular joints, RIGHT midfoot, bilateral knee, and RIGHT greater than LEFT shoulders This is favored to be degenerative in etiology. Expected physiologic distribution of radiotracer. IMPRESSION: Similar appearance of mild focal uptake within several scattered ribs. This is nonspecific and could be inflammatory in etiology. No new suspicious focal uptake. Electronically Signed   By: SValentino SaxonMD   On: 04/28/2020 15:26   CT Abdomen Pelvis W Contrast  Result Date: 04/26/2020 CLINICAL DATA:  Restaging locally recurrent left breast cancer. EXAM: CT CHEST, ABDOMEN, AND PELVIS WITH CONTRAST TECHNIQUE: Multidetector CT imaging of the chest, abdomen and pelvis was performed  following the standard protocol during bolus administration of intravenous contrast. CONTRAST:  1068mOMNIPAQUE IOHEXOL 300 MG/ML  SOLN COMPARISON:  CT chest 04/19/2018 FINDINGS: CT CHEST FINDINGS Cardiovascular: Normal heart size. No pericardial effusion. Mild aortic atherosclerosis. Mediastinum/Nodes: Previous left axillary node dissection. No enlarged axillary, supraclavicular, mediastinal, or hilar lymph nodes. Normal appearance of the esophagus. The trachea appears patent and is midline. Normal appearance of the thyroid gland. Lungs/Pleura: No pleural effusion. No suspicious pulmonary nodule or mass. Musculoskeletal: Asymmetric left breast nodular soft tissue containing biopsy clips is noted and presumably reflects a combination of postsurgical change and recurrent tumor. No suspicious bone lesions identified. CT ABDOMEN PELVIS FINDINGS Hepatobiliary: No focal liver abnormality is seen. No gallstones, gallbladder wall thickening, or biliary dilatation. Pancreas: Unremarkable. No pancreatic ductal dilatation or surrounding inflammatory changes. Spleen: Normal in size without focal abnormality. Adrenals/Urinary Tract: Normal adrenal glands. No suspicious kidney mass or hydronephrosis. Urinary bladder appears normal. Stomach/Bowel: Small hiatal hernia. No bowel wall thickening, inflammation or distension. Vascular/Lymphatic: Aortic atherosclerosis. No aneurysm. No abdominopelvic adenopathy. Reproductive: Uterus and bilateral adnexa are unremarkable. Other: No abdominal wall hernia or abnormality. No abdominopelvic ascites. Musculoskeletal: No acute or significant osseous findings. IMPRESSION: 1. Left breast lesion with surgical clip is identified compatible with known local breast cancer recurrence. 2. No signs of nodal or distant metastatic disease. 3. Small hiatal hernia. Aortic Atherosclerosis (ICD10-I70.0). Electronically Signed   By: TaKerby Moors.D.   On: 04/26/2020 17:08   DG Chest Port 1  View  Result Date: 04/25/2020 CLINICAL DATA:  Porta catheter EXAM: PORTABLE CHEST 1 VIEW COMPARISON:  04/28/2018 FINDINGS: Right porta catheter with tip at the SVC. There is no edema, consolidation, effusion, or pneumothorax. Normal heart size and mediastinal contours. Postoperative left breast and axilla. IMPRESSION: Porta catheter without complicating features. Electronically Signed   By: JoMonte Fantasia.D.   On: 04/25/2020 09:48   DG C-Arm 1-60 Min-No Report  Result Date: 04/25/2020 Fluoroscopy was utilized by the requesting physician.  No radiographic interpretation.   US[place]REAST LTD UNI LEFT INC AXILLA  Result Date: 04/04/2020 CLINICAL DATA:  89ear old female presenting for routine annual surveillance status post left breast lumpectomy July of 2020 for grade 3 triple negative breast cancer. EXAM: DIGITAL DIAGNOSTIC BILATERAL MAMMOGRAM WITH TOMOSYNTHESIS AND CAD; ULTRASOUND LEFT BREAST LIMITED TECHNIQUE: Bilateral digital diagnostic mammography and breast tomosynthesis was performed. The images were evaluated with computer-aided detection. Targeted ultrasound examination of the left breast was performed. COMPARISON:  Previous exam(s). ACR Breast Density Category c: The breast tissue is heterogeneously dense, which may obscure small masses. FINDINGS: Spot compression tomosynthesis images through the anterior left breast demonstrates a new possible mass just lateral to the patient's lumpectomy site. No other suspicious calcifications, masses or areas of distortion are seen in the bilateral breasts. Ultrasound targeted to the left breast at 3 o'clock, 1 cm from the nipple demonstrates an irregular hypoechoic mass measuring approximately 1.6 x 0.9 x 1.5 cm. Approximately 1.2 cm away from this mass is another smaller mass measuring 0.4 x 0.3 x 0.4 cm. The 2 masses together span 3.2 cm. No abnormal lymph nodes are identified in the left axilla. IMPRESSION: 1. There are 2 adjacent suspicious masses in the  left breast at 3 o'clock measuring 1.6 cm and 0.4 cm respectively. 2.  No evidence of left axillary lymphadenopathy. 3.  No evidence of right breast malignancy. RECOMMENDATION: Ultrasound guided biopsy is recommended for the 2 masses in the left breast at 3 o'clock. This has been scheduled for 04/09/2020 at 1:45 p.m. I have discussed the findings and recommendations with the patient. If applicable, a reminder letter will be sent to the patient regarding the next appointment. BI-RADS CATEGORY  5: Highly suggestive of malignancy. Electronically Signed   By: Ammie Ferrier M.D.   On: 04/04/2020 14:48   MM DIAG BREAST TOMO BILATERAL  Result Date: 04/04/2020 CLINICAL DATA:  60 year old female presenting for routine annual surveillance status post left breast lumpectomy July of 2020 for grade 3 triple negative breast cancer. EXAM: DIGITAL DIAGNOSTIC BILATERAL MAMMOGRAM WITH TOMOSYNTHESIS AND CAD; ULTRASOUND LEFT BREAST LIMITED TECHNIQUE: Bilateral digital diagnostic mammography and breast tomosynthesis was performed. The images were evaluated with computer-aided detection. Targeted ultrasound examination of the left breast was performed. COMPARISON:  Previous exam(s). ACR Breast Density Category c: The breast tissue is heterogeneously dense, which may obscure small masses. FINDINGS: Spot compression tomosynthesis images through the anterior left breast demonstrates a new possible mass just lateral to the patient's lumpectomy site. No other suspicious calcifications, masses or areas of distortion are seen in the bilateral breasts. Ultrasound targeted to the left breast at 3 o'clock, 1 cm from the nipple demonstrates an irregular hypoechoic mass measuring approximately 1.6 x 0.9 x 1.5 cm. Approximately 1.2 cm away from this mass is another smaller mass measuring 0.4 x 0.3 x 0.4 cm. The 2 masses together span 3.2 cm. No abnormal lymph nodes are identified in the left axilla. IMPRESSION: 1. There are 2 adjacent  suspicious masses in the left breast at 3 o'clock measuring 1.6 cm and 0.4 cm respectively. 2.  No evidence of left axillary lymphadenopathy. 3.  No evidence of right breast malignancy. RECOMMENDATION: Ultrasound guided biopsy is recommended for the 2 masses in the left breast at 3 o'clock. This has been scheduled for 04/09/2020 at 1:45 p.m. I have discussed the findings and recommendations with the patient. If applicable, a reminder letter will be sent to the patient regarding the next appointment. BI-RADS CATEGORY  5: Highly suggestive of malignancy. Electronically Signed   By: Ammie Ferrier M.D.   On: 04/04/2020 14:48   MM CLIP PLACEMENT LEFT  Result Date: 04/09/2020 CLINICAL DATA:  60 year old female status post 2 area ultrasound-guided biopsy of the left breast. EXAM: DIAGNOSTIC LEFT MAMMOGRAM POST ULTRASOUND BIOPSY COMPARISON:  Previous exam(s). FINDINGS: Mammographic images were obtained following ultrasound guided biopsy of the left breast x2. Both biopsy marking clips are in the expected position. IMPRESSION: Appropriate positioning of both post  biopsy marking clips in the lateral left breast. Final Assessment: Post Procedure Mammograms for Marker Placement Electronically Signed   By: Kristopher Oppenheim M.D.   On: 04/09/2020 14:59   [place] LT BREAST BX W LOC DEV 1ST LESION IMG BX SPEC US GUIDE  Addendum Date: 04/10/2020   ADDENDUM REPORT: 04/10/2020 12:55 ADDENDUM: Pathology revealed GRADE III INVASIVE DUCTAL CARCINOMA of the Left breast, 3 o'clock, 1cmfn (ribbon clip). This was found to be concordant by Dr. Kristopher Oppenheim. Pathology revealed GRADE III INVASIVE DUCTAL CARCINOMA of the Left breast, 3 o'clock, 1cmfn (coil clip). This was found to be concordant by Dr. Kristopher Oppenheim. Pathology results were discussed with the patient by telephone. The patient reported doing well after the biopsies with tenderness at the sites. Post biopsy instructions and care were reviewed and questions were answered. The  patient was encouraged to call [facility] for any additional concerns. My direct phone number was provided. Surgical consultation has been arranged with Dr. Erroll Luna at [facility] on April 19, 2020. A medical oncology referral is being arrange with Dr. Tressa Busman at [facility]. Pathology results reported by Terie Purser, RN on 04/10/2020. Electronically Signed   By: Kristopher Oppenheim M.D.   On: 04/10/2020 12:55   Result Date: 04/10/2020 CLINICAL DATA:  60 year old female with 2 suspicious left breast masses with inner lumpectomy bed. EXAM: ULTRASOUND GUIDED LEFT BREAST CORE NEEDLE BIOPSY COMPARISON:  Previous exam(s). PROCEDURE: I met with the patient and we discussed the procedure of ultrasound-guided biopsy, including benefits and alternatives. We discussed the high likelihood of a successful procedure.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

## 2020-04-29 NOTE — Telephone Encounter (Signed)
err

## 2020-04-30 ENCOUNTER — Encounter: Payer: Self-pay | Admitting: Oncology

## 2020-04-30 ENCOUNTER — Inpatient Hospital Stay: Payer: BC Managed Care – PPO

## 2020-04-30 ENCOUNTER — Other Ambulatory Visit: Payer: BC Managed Care – PPO

## 2020-04-30 ENCOUNTER — Telehealth: Payer: Self-pay | Admitting: Hematology and Oncology

## 2020-04-30 ENCOUNTER — Telehealth: Payer: Self-pay | Admitting: Oncology

## 2020-04-30 ENCOUNTER — Encounter: Payer: Self-pay | Admitting: *Deleted

## 2020-04-30 ENCOUNTER — Other Ambulatory Visit: Payer: Self-pay

## 2020-04-30 ENCOUNTER — Inpatient Hospital Stay: Payer: BC Managed Care – PPO | Admitting: Oncology

## 2020-04-30 VITALS — BP 130/82 | HR 83 | Temp 98.8°F | Resp 16

## 2020-04-30 VITALS — BP 138/87 | HR 97 | Temp 97.9°F | Resp 18 | Ht 64.0 in | Wt 156.8 lb

## 2020-04-30 DIAGNOSIS — M810 Age-related osteoporosis without current pathological fracture: Secondary | ICD-10-CM | POA: Diagnosis not present

## 2020-04-30 DIAGNOSIS — C50412 Malignant neoplasm of upper-outer quadrant of left female breast: Secondary | ICD-10-CM | POA: Diagnosis not present

## 2020-04-30 DIAGNOSIS — Z79899 Other long term (current) drug therapy: Secondary | ICD-10-CM | POA: Diagnosis not present

## 2020-04-30 DIAGNOSIS — Z801 Family history of malignant neoplasm of trachea, bronchus and lung: Secondary | ICD-10-CM | POA: Diagnosis not present

## 2020-04-30 DIAGNOSIS — M818 Other osteoporosis without current pathological fracture: Secondary | ICD-10-CM

## 2020-04-30 DIAGNOSIS — Z9221 Personal history of antineoplastic chemotherapy: Secondary | ICD-10-CM | POA: Diagnosis not present

## 2020-04-30 DIAGNOSIS — Z5111 Encounter for antineoplastic chemotherapy: Secondary | ICD-10-CM | POA: Diagnosis not present

## 2020-04-30 DIAGNOSIS — Z8051 Family history of malignant neoplasm of kidney: Secondary | ICD-10-CM | POA: Diagnosis not present

## 2020-04-30 DIAGNOSIS — Z923 Personal history of irradiation: Secondary | ICD-10-CM | POA: Diagnosis not present

## 2020-04-30 DIAGNOSIS — Z171 Estrogen receptor negative status [ER-]: Secondary | ICD-10-CM

## 2020-04-30 DIAGNOSIS — Z5112 Encounter for antineoplastic immunotherapy: Secondary | ICD-10-CM | POA: Diagnosis not present

## 2020-04-30 DIAGNOSIS — Z8249 Family history of ischemic heart disease and other diseases of the circulatory system: Secondary | ICD-10-CM | POA: Diagnosis not present

## 2020-04-30 MED ORDER — HEPARIN SOD (PORK) LOCK FLUSH 100 UNIT/ML IV SOLN
500.0000 [IU] | Freq: Once | INTRAVENOUS | Status: AC | PRN
  Administered 2020-04-30: 500 [IU]
  Filled 2020-04-30: qty 5

## 2020-04-30 MED ORDER — SODIUM CHLORIDE 0.9 % IV SOLN
Freq: Once | INTRAVENOUS | Status: AC
  Filled 2020-04-30: qty 250

## 2020-04-30 MED ORDER — PALONOSETRON HCL INJECTION 0.25 MG/5ML
0.2500 mg | Freq: Once | INTRAVENOUS | Status: AC
  Administered 2020-04-30: 0.25 mg via INTRAVENOUS

## 2020-04-30 MED ORDER — SODIUM CHLORIDE 0.9 % IV SOLN
600.0000 mg/m2 | Freq: Once | INTRAVENOUS | Status: AC
  Administered 2020-04-30: 1100 mg via INTRAVENOUS
  Filled 2020-04-30: qty 55

## 2020-04-30 MED ORDER — DOCETAXEL CHEMO INJECTION 160 MG/16ML
75.0000 mg/m2 | Freq: Once | INTRAVENOUS | Status: AC
  Administered 2020-04-30: 140 mg via INTRAVENOUS
  Filled 2020-04-30: qty 14

## 2020-04-30 MED ORDER — PALONOSETRON HCL INJECTION 0.25 MG/5ML
INTRAVENOUS | Status: AC
  Filled 2020-04-30: qty 5

## 2020-04-30 MED ORDER — SODIUM CHLORIDE 0.9 % IV SOLN
10.0000 mg | Freq: Once | INTRAVENOUS | Status: AC
  Administered 2020-04-30: 10 mg via INTRAVENOUS
  Filled 2020-04-30: qty 10

## 2020-04-30 MED ORDER — SODIUM CHLORIDE 0.9% FLUSH
10.0000 mL | INTRAVENOUS | Status: DC | PRN
Start: 2020-04-30 — End: 2020-04-30
  Administered 2020-04-30: 10 mL
  Filled 2020-04-30: qty 10

## 2020-04-30 NOTE — Progress Notes (Signed)
Pt has decided not to use dignicap and per Dr. Jana Hakim ok to use labs from 04/16/20 for today.

## 2020-04-30 NOTE — Telephone Encounter (Signed)
Scheduled appts per 3/8 los. Pt stated she would refer to mychart for appts and AVS.

## 2020-04-30 NOTE — Patient Instructions (Signed)
[facility] Discharge Instructions for Patients Receiving Chemotherapy  Today you received the following chemotherapy agents: Docetaxel (Taxotere) and Cyclophosphamide (Cytoxan).  To help prevent nausea and vomiting after your treatment, we encourage you to take your nausea medication as directed by your MD.   If you develop nausea and vomiting that is not controlled by your nausea medication, call the clinic.   BELOW ARE SYMPTOMS THAT SHOULD BE REPORTED IMMEDIATELY:  *FEVER GREATER THAN 100.5 F  *CHILLS WITH OR WITHOUT FEVER  NAUSEA AND VOMITING THAT IS NOT CONTROLLED WITH YOUR NAUSEA MEDICATION  *UNUSUAL SHORTNESS OF BREATH  *UNUSUAL BRUISING OR BLEEDING  TENDERNESS IN MOUTH AND THROAT WITH OR WITHOUT PRESENCE OF ULCERS  *URINARY PROBLEMS  *BOWEL PROBLEMS  UNUSUAL RASH Items with * indicate a potential emergency and should be followed up as soon as possible.  Feel free to call the clinic should you have any questions or concerns. The clinic phone number is [phone].  Please show the [facility] at check-in to the Emergency Department and triage nurse.  Docetaxel injection What is this medicine? DOCETAXEL (doe se TAX el) is a chemotherapy drug. It targets fast dividing cells, like cancer cells, and causes these cells to die. This medicine is used to treat many types of cancers like breast cancer, certain stomach cancers, head and neck cancer, lung cancer, and prostate cancer. This medicine may be used for other purposes; ask your health care provider or pharmacist if you have questions. COMMON BRAND NAME(S): Docefrez, Taxotere What should I tell my health care provider before I take this medicine? They need to know if you have any of these conditions:  infection (especially a virus infection such as chickenpox, cold sores, or herpes)  liver disease  low blood counts, like low white cell, platelet, or red cell counts  an unusual or allergic  reaction to docetaxel, polysorbate 80, other chemotherapy agents, other medicines, foods, dyes, or preservatives  pregnant or trying to get pregnant  breast-feeding How should I use this medicine? This drug is given as an infusion into a vein. It is administered in a hospital or clinic by a specially trained health care professional. Talk to your pediatrician regarding the use of this medicine in children. Special care may be needed. Overdosage: If you think you have taken too much of this medicine contact a poison control center or emergency room at once. NOTE: This medicine is only for you. Do not share this medicine with others. What if I miss a dose? It is important not to miss your dose. Call your doctor or health care professional if you are unable to keep an appointment. What may interact with this medicine? Do not take this medicine with any of the following medications:  live virus vaccines This medicine may also interact with the following medications:  aprepitant  certain antibiotics like erythromycin or clarithromycin  certain antivirals for HIV or hepatitis  certain medicines for fungal infections like fluconazole, itraconazole, ketoconazole, posaconazole, or voriconazole  cimetidine  ciprofloxacin  conivaptan  cyclosporine  dronedarone  fluvoxamine  grapefruit juice  imatinib  verapamil This list may not describe all possible interactions. Give your health care provider a list of all the medicines, herbs, non-prescription drugs, or dietary supplements you use. Also tell them if you smoke, drink alcohol, or use illegal drugs. Some items may interact with your medicine. What should I watch for while using this medicine? Your condition will be monitored carefully while you are receiving  this medicine. You will need important blood work done while you are taking this medicine. Call your doctor or health care professional for advice if you get a fever, chills or  sore throat, or other symptoms of a cold or flu. Do not treat yourself. This drug decreases your body's ability to fight infections. Try to avoid being around people who are sick. Some products may contain alcohol. Ask your health care professional if this medicine contains alcohol. Be sure to tell all health care professionals you are taking this medicine. Certain medicines, like metronidazole and disulfiram, can cause an unpleasant reaction when taken with alcohol. The reaction includes flushing, headache, nausea, vomiting, sweating, and increased thirst. The reaction can last from 30 minutes to several hours. You may get drowsy or dizzy. Do not drive, use machinery, or do anything that needs mental alertness until you know how this medicine affects you. Do not stand or sit up quickly, especially if you are an older patient. This reduces the risk of dizzy or fainting spells. Alcohol may interfere with the effect of this medicine. Talk to your health care professional about your risk of cancer. You may be more at risk for certain types of cancer if you take this medicine. Do not become pregnant while taking this medicine or for 6 months after stopping it. Women should inform their doctor if they wish to become pregnant or think they might be pregnant. There is a potential for serious side effects to an unborn child. Talk to your health care professional or pharmacist for more information. Do not breast-feed an infant while taking this medicine or for 1 week after stopping it. Males who get this medicine must use a condom during sex with females who can get pregnant. If you get a woman pregnant, the baby could have birth defects. The baby could die before they are born. You will need to continue wearing a condom for 3 months after stopping the medicine. Tell your health care provider right away if your partner becomes pregnant while you are taking this medicine. This may interfere with the ability to father a  child. You should talk to your doctor or health care professional if you are concerned about your fertility. What side effects may I notice from receiving this medicine? Side effects that you should report to your doctor or health care professional as soon as possible:  allergic reactions like skin rash, itching or hives, swelling of the face, lips, or tongue  blurred vision  breathing problems  changes in vision  low blood counts - This drug may decrease the number of white blood cells, red blood cells and platelets. You may be at increased risk for infections and bleeding.  nausea and vomiting  pain, redness or irritation at site where injected  pain, tingling, numbness in the hands or feet  redness, blistering, peeling, or loosening of the skin, including inside the mouth  signs of decreased platelets or bleeding - bruising, pinpoint red spots on the skin, black, tarry stools, nosebleeds  signs of decreased red blood cells - unusually weak or tired, fainting spells, lightheadedness  signs of infection - fever or chills, cough, sore throat, pain or difficulty passing urine  swelling of the ankle, feet, hands Side effects that usually do not require medical attention (report to your doctor or health care professional if they continue or are bothersome):  constipation  diarrhea  fingernail or toenail changes  hair loss  loss of appetite  mouth sores  muscle pain This list may not describe all possible side effects. Call your doctor for medical advice about side effects. You may report side effects to FDA at 1-800-FDA-1088. Where should I keep my medicine? This drug is given in a hospital or clinic and will not be stored at home. NOTE: This sheet is a summary. It may not cover all possible information. If you have questions about this medicine, talk to your doctor, pharmacist, or health care provider.  2021 Elsevier/Gold Standard (2019-01-09  19:50:31)  Cyclophosphamide Injection What is this medicine? CYCLOPHOSPHAMIDE (sye kloe FOSS fa mide) is a chemotherapy drug. It slows the growth of cancer cells. This medicine is used to treat many types of cancer like lymphoma, myeloma, leukemia, breast cancer, and ovarian cancer, to name a few. This medicine may be used for other purposes; ask your health care provider or pharmacist if you have questions. COMMON BRAND NAME(S): Cytoxan, Neosar What should I tell my health care provider before I take this medicine? They need to know if you have any of these conditions:  heart disease  history of irregular heartbeat  infection  kidney disease  liver disease  low blood counts, like white cells, platelets, or red blood cells  on hemodialysis  recent or ongoing radiation therapy  scarring or thickening of the lungs  trouble passing urine  an unusual or allergic reaction to cyclophosphamide, other medicines, foods, dyes, or preservatives  pregnant or trying to get pregnant  breast-feeding How should I use this medicine? This drug is usually given as an injection into a vein or muscle or by infusion into a vein. It is administered in a hospital or clinic by a specially trained health care professional. Talk to your pediatrician regarding the use of this medicine in children. Special care may be needed. Overdosage: If you think you have taken too much of this medicine contact a poison control center or emergency room at once. NOTE: This medicine is only for you. Do not share this medicine with others. What if I miss a dose? It is important not to miss your dose. Call your doctor or health care professional if you are unable to keep an appointment. What may interact with this medicine?  amphotericin B  azathioprine  certain antivirals for HIV or hepatitis  certain medicines for blood pressure, heart disease, irregular heart beat  certain medicines that treat or prevent  blood clots like warfarin  certain other medicines for cancer  cyclosporine  etanercept  indomethacin  medicines that relax muscles for surgery  medicines to increase blood counts  metronidazole This list may not describe all possible interactions. Give your health care provider a list of all the medicines, herbs, non-prescription drugs, or dietary supplements you use. Also tell them if you smoke, drink alcohol, or use illegal drugs. Some items may interact with your medicine. What should I watch for while using this medicine? Your condition will be monitored carefully while you are receiving this medicine. You may need blood work done while you are taking this medicine. Drink water or other fluids as directed. Urinate often, even at night. Some products may contain alcohol. Ask your health care professional if this medicine contains alcohol. Be sure to tell all health care professionals you are taking this medicine. Certain medicines, like metronidazole and disulfiram, can cause an unpleasant reaction when taken with alcohol. The reaction includes flushing, headache, nausea, vomiting, sweating, and increased thirst. The reaction can last from 30 minutes to several hours. Do not  become pregnant while taking this medicine or for 1 year after stopping it. Women should inform their health care professional if they wish to become pregnant or think they might be pregnant. Men should not father a child while taking this medicine and for 4 months after stopping it. There is potential for serious side effects to an unborn child. Talk to your health care professional for more information. Do not breast-feed an infant while taking this medicine or for 1 week after stopping it. This medicine has caused ovarian failure in some women. This medicine may make it more difficult to get pregnant. Talk to your health care professional if you are concerned about your fertility. This medicine has caused decreased  sperm counts in some men. This may make it more difficult to father a child. Talk to your health care professional if you are concerned about your fertility. Call your health care professional for advice if you get a fever, chills, or sore throat, or other symptoms of a cold or flu. Do not treat yourself. This medicine decreases your body's ability to fight infections. Try to avoid being around people who are sick. Avoid taking medicines that contain aspirin, acetaminophen, ibuprofen, naproxen, or ketoprofen unless instructed by your health care professional. These medicines may hide a fever. Talk to your health care professional about your risk of cancer. You may be more at risk for certain types of cancer if you take this medicine. If you are going to need surgery or other procedure, tell your health care professional that you are using this medicine. Be careful brushing or flossing your teeth or using a toothpick because you may get an infection or bleed more easily. If you have any dental work done, tell your dentist you are receiving this medicine. What side effects may I notice from receiving this medicine? Side effects that you should report to your doctor or health care professional as soon as possible:  allergic reactions like skin rash, itching or hives, swelling of the face, lips, or tongue  breathing problems  nausea, vomiting  signs and symptoms of bleeding such as bloody or black, tarry stools; red or dark brown urine; spitting up blood or brown material that looks like coffee grounds; red spots on the skin; unusual bruising or bleeding from the eyes, gums, or nose  signs and symptoms of heart failure like fast, irregular heartbeat, sudden weight gain; swelling of the ankles, feet, hands  signs and symptoms of infection like fever; chills; cough; sore throat; pain or trouble passing urine  signs and symptoms of kidney injury like trouble passing urine or change in the amount of  urine  signs and symptoms of liver injury like dark yellow or brown urine; general ill feeling or flu-like symptoms; light-colored stools; loss of appetite; nausea; right upper belly pain; unusually weak or tired; yellowing of the eyes or skin Side effects that usually do not require medical attention (report to your doctor or health care professional if they continue or are bothersome):  confusion  decreased hearing  diarrhea  facial flushing  hair loss  headache  loss of appetite  missed menstrual periods  signs and symptoms of low red blood cells or anemia such as unusually weak or tired; feeling faint or lightheaded; falls  skin discoloration This list may not describe all possible side effects. Call your doctor for medical advice about side effects. You may report side effects to FDA at 1-800-FDA-1088. Where should I keep my medicine? This drug is  given in a hospital or clinic and will not be stored at home. NOTE: This sheet is a summary. It may not cover all possible information. If you have questions about this medicine, talk to your doctor, pharmacist, or health care provider.  2021 Elsevier/Gold Standard (2018-11-14 09:53:29)

## 2020-05-02 ENCOUNTER — Inpatient Hospital Stay: Payer: BC Managed Care – PPO

## 2020-05-02 ENCOUNTER — Other Ambulatory Visit: Payer: Self-pay

## 2020-05-02 VITALS — BP 141/88 | HR 97 | Temp 98.0°F | Resp 18

## 2020-05-02 DIAGNOSIS — Z9221 Personal history of antineoplastic chemotherapy: Secondary | ICD-10-CM | POA: Diagnosis not present

## 2020-05-02 DIAGNOSIS — Z79899 Other long term (current) drug therapy: Secondary | ICD-10-CM | POA: Diagnosis not present

## 2020-05-02 DIAGNOSIS — C50412 Malignant neoplasm of upper-outer quadrant of left female breast: Secondary | ICD-10-CM | POA: Diagnosis not present

## 2020-05-02 DIAGNOSIS — Z923 Personal history of irradiation: Secondary | ICD-10-CM | POA: Diagnosis not present

## 2020-05-02 DIAGNOSIS — Z8249 Family history of ischemic heart disease and other diseases of the circulatory system: Secondary | ICD-10-CM | POA: Diagnosis not present

## 2020-05-02 DIAGNOSIS — Z171 Estrogen receptor negative status [ER-]: Secondary | ICD-10-CM | POA: Diagnosis not present

## 2020-05-02 DIAGNOSIS — Z801 Family history of malignant neoplasm of trachea, bronchus and lung: Secondary | ICD-10-CM | POA: Diagnosis not present

## 2020-05-02 DIAGNOSIS — Z5112 Encounter for antineoplastic immunotherapy: Secondary | ICD-10-CM | POA: Diagnosis not present

## 2020-05-02 DIAGNOSIS — M810 Age-related osteoporosis without current pathological fracture: Secondary | ICD-10-CM | POA: Diagnosis not present

## 2020-05-02 DIAGNOSIS — Z5111 Encounter for antineoplastic chemotherapy: Secondary | ICD-10-CM | POA: Diagnosis not present

## 2020-05-02 DIAGNOSIS — Z8051 Family history of malignant neoplasm of kidney: Secondary | ICD-10-CM | POA: Diagnosis not present

## 2020-05-02 MED ORDER — PEGFILGRASTIM-CBQV 6 MG/0.6ML ~~LOC~~ SOSY
6.0000 mg | PREFILLED_SYRINGE | Freq: Once | SUBCUTANEOUS | Status: AC
  Administered 2020-05-02: 6 mg via SUBCUTANEOUS

## 2020-05-02 MED ORDER — PEGFILGRASTIM-CBQV 6 MG/0.6ML ~~LOC~~ SOSY
PREFILLED_SYRINGE | SUBCUTANEOUS | Status: AC
  Filled 2020-05-02: qty 0.6

## 2020-05-02 NOTE — Patient Instructions (Signed)
Pegfilgrastim injection What is this medicine? PEGFILGRASTIM (PEG fil gra stim) is a long-acting granulocyte colony-stimulating factor that stimulates the growth of neutrophils, a type of white blood cell important in the body's fight against infection. It is used to reduce the incidence of fever and infection in patients with certain types of cancer who are receiving chemotherapy that affects the bone marrow, and to increase survival after being exposed to high doses of radiation. This medicine may be used for other purposes; ask your health care provider or pharmacist if you have questions. COMMON BRAND NAME(S): Fulphila, Neulasta, Nyvepria, UDENYCA, Ziextenzo What should I tell my health care provider before I take this medicine? They need to know if you have any of these conditions:  kidney disease  latex allergy  ongoing radiation therapy  sickle cell disease  skin reactions to acrylic adhesives (On-Body Injector only)  an unusual or allergic reaction to pegfilgrastim, filgrastim, other medicines, foods, dyes, or preservatives  pregnant or trying to get pregnant  breast-feeding How should I use this medicine? This medicine is for injection under the skin. If you get this medicine at home, you will be taught how to prepare and give the pre-filled syringe or how to use the On-body Injector. Refer to the patient Instructions for Use for detailed instructions. Use exactly as directed. Tell your healthcare provider immediately if you suspect that the On-body Injector may not have performed as intended or if you suspect the use of the On-body Injector resulted in a missed or partial dose. It is important that you put your used needles and syringes in a special sharps container. Do not put them in a trash can. If you do not have a sharps container, call your pharmacist or healthcare provider to get one. Talk to your pediatrician regarding the use of this medicine in children. While this drug  may be prescribed for selected conditions, precautions do apply. Overdosage: If you think you have taken too much of this medicine contact a poison control center or emergency room at once. NOTE: This medicine is only for you. Do not share this medicine with others. What if I miss a dose? It is important not to miss your dose. Call your doctor or health care professional if you miss your dose. If you miss a dose due to an On-body Injector failure or leakage, a new dose should be administered as soon as possible using a single prefilled syringe for manual use. What may interact with this medicine? Interactions have not been studied. This list may not describe all possible interactions. Give your health care provider a list of all the medicines, herbs, non-prescription drugs, or dietary supplements you use. Also tell them if you smoke, drink alcohol, or use illegal drugs. Some items may interact with your medicine. What should I watch for while using this medicine? Your condition will be monitored carefully while you are receiving this medicine. You may need blood work done while you are taking this medicine. Talk to your health care provider about your risk of cancer. You may be more at risk for certain types of cancer if you take this medicine. If you are going to need a MRI, CT scan, or other procedure, tell your doctor that you are using this medicine (On-Body Injector only). What side effects may I notice from receiving this medicine? Side effects that you should report to your doctor or health care professional as soon as possible:  allergic reactions (skin rash, itching or hives, swelling of   the face, lips, or tongue)  back pain  dizziness  fever  pain, redness, or irritation at site where injected  pinpoint red spots on the skin  red or dark-brown urine  shortness of breath or breathing problems  stomach or side pain, or pain at the shoulder  swelling  tiredness  trouble  passing urine or change in the amount of urine  unusual bruising or bleeding Side effects that usually do not require medical attention (report to your doctor or health care professional if they continue or are bothersome):  bone pain  muscle pain This list may not describe all possible side effects. Call your doctor for medical advice about side effects. You may report side effects to FDA at 1-800-FDA-1088. Where should I keep my medicine? Keep out of the reach of children. If you are using this medicine at home, you will be instructed on how to store it. Throw away any unused medicine after the expiration date on the label. NOTE: This sheet is a summary. It may not cover all possible information. If you have questions about this medicine, talk to your doctor, pharmacist, or health care provider.  2021 Elsevier/Gold Standard (2019-03-03 13:20:51)  

## 2020-05-03 ENCOUNTER — Encounter: Payer: Self-pay | Admitting: Licensed Clinical Social Worker

## 2020-05-03 ENCOUNTER — Encounter: Payer: Self-pay | Admitting: Oncology

## 2020-05-03 ENCOUNTER — Telehealth: Payer: Self-pay

## 2020-05-03 NOTE — Progress Notes (Signed)
[facility] CSW Progress Note  Clinical Education officer, museum received call from patient asking about completing disability paperwork through work. CSW provided contact information for [employer] who manages this.   CSW also discussed other available resources through breast cancer foundations. E-mailed information to patient per her request. She will contact CSW with any further questions or needs.    Christeen Douglas , LCSW

## 2020-05-03 NOTE — Progress Notes (Signed)
Called pt to discuss copay assistance since she started treatment again.  Pt gave me consent to apply in her behalf soI enrolled her in the [place] $15,000 for 12 monthsfrom3/11/22.Pt is eligible to have $0 OOP cost.  I also informed her of the [place] again, went over what it covers and gave her the income requirement.  She would like to apply so she will bring proof of income on 05/07/20.  If approved I will give her an expense sheet.  She has my card for anyquestions or concernsshe may havein the future.

## 2020-05-03 NOTE — Telephone Encounter (Signed)
Contacted pt regarding her my chart message. Pt c/o  burning esophagus . Explained to pt that dexamethasone can cause esophagitis and it should resolve. Pt stated she remembered now having that last time from the same thing. Pt stated she was currently not having any issues. Encouraged pt to call if her s/s did not resolve or she started feeling any worse.

## 2020-05-06 NOTE — Progress Notes (Signed)
Kathryn [name]  [phone] [phone]    ID: Kathryn [name] DOB: 02-19-1961  MR#: [id number]  GUR#:[id number]  Patient Care Team: Kandace Blitz, MD as PCP - General (Obstetrics and Gynecology) Rockwell Germany, RN as Oncology Nurse Navigator Tressie Ellis, Paulette Blanch, RN as Oncology Nurse Navigator Erroll Luna, MD as Consulting Physician (General Surgery) Ariyanah Aguado, Virgie Dad, MD as Consulting Physician (Oncology) Kyung Rudd, MD as Consulting Physician (Radiation Oncology) OTHER MD: Rana Snare (OBGYN)   CHIEF COMPLAINT:  Recurrent triple negative breast cancer  CURRENT TREATMENT: Neoadjuvant chemotherapy; zoledronate (Q 6 mos)   INTERVAL HISTORY: Kathryn [name] returns today for follow-up and treatment of her now recurrent triple negative breast cancer, accompanied by her [name]  She started neoadjuvant chemotherapy, consisting of cyclophosphamide and docetaxel every 3 weeks x 4, at her last visit on 04/30/2020.  Today is day 8 cycle 1.  She received PEG fill gastrium on day 3  She started zoledronate on 06/30/2019.  She tolerated that well except for hot flashes.  Most recent dose was 01/25/2020   REVIEW OF SYSTEMS: Kathryn [name] felt pretty washed out after the first dose and went to sleep that afternoon after getting home.  On day 2 she was able to go to work although she did not do very much.  They 3 with after the pill gastrium she started to have significant reflux symptoms, which are likely not related to the shot but to the dexamethasone that she was taking.  In addition she was severely constipated.  She had a bowel movement on the day of treatment but not for a week afterwards so she has started getting much more nausea as a result.  She eventually had a bowel movement early in the morning of day 7. She also has developed some thrush.   COVID 19 VACCINATION STATUS: Not vaccinated; thinks she had COVID-19 in 2020; infection 02/23/2020 with complete  recovery   HISTORY OF CURRENT ILLNESS: From the original intake note:  Kathryn [name] presented with a palpable subareolar left breast lump, nipple retraction, and discharge for approximately 1 week. She underwent bilateral diagnostic mammography with tomography and left breast ultrasonography at [facility] on 04/01/2018 showing: Breast Density Category C. An irregular hyperdense mass is seen in the left subareolar region. An additional oval, circumscribed mass is seen in the inferior central aspect at posterior depth. No additional suspicious findings are identified within the remainder of the left breast. On physical exam, there is a 2-3 cm firm, fixed lump in the subareolar region on the left. Subtle skin changes and retraction is noted along the left nipple. Targeted ultrasound is performed, showing an irregular hypoechoic mass with associated vascularity at the 12 o'clock retroareolar position on the left. Overall measurements are 2.6 x 2.3 x 1.2 cm. This corresponds with the mammographic finding. Two adjacent circumscribed hypoechoic masses are identified in the deep 6 o'clock position 2 cm from the nipple. They measure 0.8 x 0.6 x 0.4 cm and 1.3 x 1.1 x 0.7 cm. There is no seated vascularity. This corresponds with the additional mammographic finding and likely represents minimally complicated cyst. Evaluation of the left axilla demonstrates a markedly enlarged abnormal lymph node with complete hilar replacement. It measures up to 5 cm in long axis dimension. No suspicious mammographic findings on the right.   Accordingly on 04/06/2018 she proceeded to biopsy of the left breast area in question. The pathology from this procedure showed ([id number]): invasive ductal carcinoma, grade III, with lymphovascular  invasion. Prognostic indicators significant for: estrogen receptor, 0% negative and progesterone receptor, 0% negative. Proliferation marker Ki67 at 70%. HER2 equivocal (2+) by  immunohistochemistry, but negative by FISH (print3ed report pending).  On the same day she underwent a biopsy of the left axillary lymph node on 04/06/2018 showing ([id number]): metastatic carcinoma in 1 of 1 lymph node (1/1).   The patient's subsequent history is as detailed below.   PAST MEDICAL HISTORY: Past Medical History:  Diagnosis Date  . Breast cancer (HCC)    stage 3 - left  . Family history of kidney cancer   . Family history of melanoma   . Headache    hormone related, none since 1st child was born   . Personal history of chemotherapy   . Personal history of radiation therapy   . Pleurisy     PAST SURGICAL HISTORY: Past Surgical History:  Procedure Laterality Date  . AXILLARY LYMPH NODE DISSECTION Left 10/11/2018   Procedure: LEFT AXILLARY LYMPH NODE DISSECTION;  Surgeon: Harriette Bouillon, MD;  Location: [facility];  Service: General;  Laterality: Left;  . BREAST LUMPECTOMY Left 08/2018  . BREAST LUMPECTOMY WITH RADIOACTIVE SEED AND SENTINEL LYMPH NODE BIOPSY Left 09/23/2018   Procedure: LEFT BREAST LUMPECTOMY WITH RADIOACTIVE SEED AND LEFT AXILLARY TARGETED LYMPH NODE BIOPY AND  LEFT AXILLARY SENTINEL LYMPH NODE MAPPING;  Surgeon: Harriette Bouillon, MD;  Location: [facility];  Service: General;  Laterality: Left;  . CESAREAN SECTION     x3  . ORIF HUMERUS FRACTURE Right 01/31/2019   Procedure: OPEN REDUCTION INTERNAL FIXATION (ORIF) Right 3 part proximal humerus fracture;  Surgeon: Francena Hanly, MD;  Location: [facility];  Service: Orthopedics;  Laterality: Right;   . PORT-A-CATH REMOVAL Right 07/18/2019   Procedure: REMOVAL PORT-A-CATH;  Surgeon: Harriette Bouillon, MD;  Location: [facility];  Service: General;  Laterality: Right;  . PORTACATH PLACEMENT N/A 04/28/2018   Procedure: INSERTION PORT-A-CATH WITH ULTRASOUND;  Surgeon: Harriette Bouillon, MD;  Location: [facility];  Service: General;  Laterality: N/A;  . PORTACATH PLACEMENT N/A 04/25/2020    Procedure: INSERTION PORT-A-CATH;  Surgeon: Harriette Bouillon, MD;  Location: [facility];  Service: General;  Laterality: N/A;  60 MIN IN LDOW PLEASE    FAMILY HISTORY: Family History  Problem Relation Age of Onset  . Kidney cancer Sister 57       d. 54  . Lung cancer Paternal Uncle   . Melanoma Sister   . Melanoma Sister    Kathryn [name]'s father died from congestive heart failure at age 50. Patients' mother is 69 as of 03/2018. The patient has 1 brother and 3 sisters. Patient denies anyone in her family having breast, ovarian, prostate, or pancreatic cancer. Ambur's sister, Kathryn [name], was diagnosed with Kidney Cancer at 51. Kathryn [name] has an uncle and a cousin that were diagnosed with lung cancer, but they were both heavy smokers.    GYNECOLOGIC HISTORY:  No LMP recorded. Patient is postmenopausal. 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. Her [name], Kathryn [name], owns [employer]. Together, they have three children, Kathryn [name], Kathryn [name], and Kathryn [name]. 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  In 2021 she married Kathryn [name] who is working as a Psychologist, occupational.  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  The patient is expecting her first grandson in 2022  ADVANCED DIRECTIVES: Kathryn [name], Kathryn [name], is automatically her healthcare power of attorney.    HEALTH MAINTENANCE: Social History   Tobacco Use  . Smoking status: Never Smoker  . Smokeless tobacco: Never Used  Vaping Use  . Vaping Use: Never used  Substance Use Topics  . Alcohol use: Not Currently    Comment: rare  . Drug use: Never    Colonoscopy: no  PAP: 2015  Bone density:  no   No Known Allergies  Current Outpatient Medications  Medication Sig Dispense Refill  . fluconazole (DIFLUCAN) 100 MG tablet Take 1 tablet (100 mg total) by mouth daily. Start day of chemo and continue for 6 days 18 tablet 1  . omeprazole (PRILOSEC) 40 MG capsule Take 1 capsule (40 mg total) by mouth daily. 50 capsule 1  . ondansetron (ZOFRAN) 8 MG tablet Take 1 tablet (8 mg total) by mouth 2 (two) times daily as needed for nausea or vomiting. Start day 4 after chemo. 30 tablet 2  . polyethylene glycol powder (MIRALAX) 17 GM/SCOOP powder Take 17 g by mouth once for 1 dose. Start the day of chemo and continue for 7 days 255 g 0  . acetaminophen (TYLENOL) 500 MG tablet Take 1,000 mg by mouth every 6 (six) hours as needed (for pain.).    Marland Kitchen cholecalciferol (VITAMIN D3) 25 MCG (1000 UNIT) tablet Take 1 tablet (1,000 Units total) by mouth daily.    Marland Kitchen dexamethasone (DECADRON) 4 MG tablet Take 1 tablet (4 mg total) by mouth 2 (two) times daily. Start the day before Taxotere. Then again the day after chemo for 3 days. 30 tablet 1  . docusate sodium (COLACE) 100 MG capsule Take 2 capsules (200 mg total) by mouth in the morning and at bedtime. Start the morning of chemotherapy and continue for one week 60 capsule 6  . ibuprofen (ADVIL) 800 MG tablet Take 1 tablet (800 mg total) by mouth every 8 (eight) hours as needed. 30 tablet 0  . lidocaine-prilocaine (EMLA) cream Apply to affected area once (Patient taking differently: Apply 1 application topically daily as needed (port access).) 30 g 3  . loratadine (CLARITIN) 10 MG tablet Take 1 tablet (10 mg total) by mouth daily. (Patient taking differently: Take 10 mg by mouth daily as needed for allergies.) 60 tablet 1  . oxyCODONE (OXY IR/ROXICODONE) 5 MG immediate release tablet Take 1 tablet (5 mg total) by mouth every 6 (six) hours as needed for severe pain. 15 tablet 0  . prochlorperazine (COMPAZINE) 10 MG tablet Take 1 tablet (10 mg total) by mouth every 6  (six) hours as needed (Nausea or vomiting). 30 tablet 1   No current facility-administered medications for this visit.    OBJECTIVE: white woman who appears well  Vitals:   05/07/20 1245  BP: 114/74  Pulse: (!) 123  Resp: 17  Temp: (!) 97.4 F (36.3 C)  SpO2: 100%     Body mass index is 26.57 kg/m.   Wt Readings from Last 3 Encounters:  05/07/20 154 lb 12.8 oz (70.2 kg)  04/30/20 156 lb 12.8 oz (71.1 kg)  04/19/20 160 lb (72.6 kg)  ECOG FS:1  Sclerae unicteric, EOMs intact Wearing a mask No cervical or supraclavicular adenopathy Lungs no rales or rhonchi Heart regular rate and rhythm Abd  soft, nontender, positive bowel sounds MSK no focal spinal tenderness, no upper extremity lymphedema Neuro: nonfocal, well oriented, appropriate affect Breasts: Deferred   LAB RESULTS:  CMP     Component Value Date/Time   NA 141 04/16/2020 1529   K 4.0 04/16/2020 1529   CL 107 04/16/2020 1529   CO2 25 04/16/2020 1529   GLUCOSE 98 04/16/2020 1529   BUN 10 04/16/2020 1529   CREATININE 0.70 04/16/2020 1529   CREATININE 0.62 08/02/2018 0834   CALCIUM 9.1 04/16/2020 1529   PROT 7.4 04/16/2020 1529   ALBUMIN 4.2 04/16/2020 1529   AST 18 04/16/2020 1529   AST 29 08/02/2018 0834   ALT 13 04/16/2020 1529   ALT 41 08/02/2018 0834   ALKPHOS 75 04/16/2020 1529   BILITOT 0.4 04/16/2020 1529   BILITOT 0.3 08/02/2018 0834   GFRNONAA >60 04/16/2020 1529   GFRNONAA >60 08/02/2018 0834   GFRAA >60 10/18/2019 1427   GFRAA >60 08/02/2018 0834    No results found for: TOTALPROTELP, ALBUMINELP, A1GS, A2GS, BETS, BETA2SER, GAMS, MSPIKE, SPEI  No results found for: KPAFRELGTCHN, LAMBDASER, KAPLAMBRATIO  Lab Results  Component Value Date   WBC 2.0 (L) 05/07/2020   NEUTROABS PENDING 05/07/2020   HGB 12.5 05/07/2020   HCT 37.3 05/07/2020   MCV 93.5 05/07/2020   PLT 104 (L) 05/07/2020   No results found for: LABCA2  No components found for: [id number]  No results for input(s): INR  in the last 168 hours.  No results found for: LABCA2  No results found for: CAN199  No results found for: YQI347  No results found for: QQV956  Lab Results  Component Value Date   CA2729 24.2 04/16/2020    No components found for: HGQUANT  No results found for: CEA1 / No results found for: CEA1   No results found for: AFPTUMOR  No results found for: CHROMOGRNA  No results found for: HGBA, HGBA2QUANT, HGBFQUANT, HGBSQUAN (Hemoglobinopathy evaluation)   No results found for: LDH  No results found for: IRON, TIBC, IRONPCTSAT (Iron and TIBC)  No results found for: FERRITIN  Urinalysis No results found for: COLORURINE, APPEARANCEUR, LABSPEC, PHURINE, GLUCOSEU, HGBUR, BILIRUBINUR, KETONESUR, PROTEINUR, UROBILINOGEN, NITRITE, LEUKOCYTESUR   STUDIES:  CT Chest W Contrast  Result Date: 04/26/2020 CLINICAL DATA:  Restaging locally recurrent left breast cancer. EXAM: CT CHEST, ABDOMEN, AND PELVIS WITH CONTRAST TECHNIQUE: Multidetector CT imaging of the chest, abdomen and pelvis was performed following the standard protocol during bolus administration of intravenous contrast. CONTRAST:  120mL OMNIPAQUE IOHEXOL 300 MG/ML  SOLN COMPARISON:  CT chest 04/19/2018 FINDINGS: CT CHEST FINDINGS Cardiovascular: Normal heart size. No pericardial effusion. Mild aortic atherosclerosis. Mediastinum/Nodes: Previous left axillary node dissection. No enlarged axillary, supraclavicular, mediastinal, or hilar lymph nodes. Normal appearance of the esophagus. The trachea appears patent and is midline. Normal appearance of the thyroid gland. Lungs/Pleura: No pleural effusion. No suspicious pulmonary nodule or mass. Musculoskeletal: Asymmetric left breast nodular soft tissue containing biopsy clips is noted and presumably reflects a combination of postsurgical change and recurrent tumor. No suspicious bone lesions identified. CT ABDOMEN PELVIS FINDINGS Hepatobiliary: No focal liver abnormality is seen. No  gallstones, gallbladder wall thickening, or biliary dilatation. Pancreas: Unremarkable. No pancreatic ductal dilatation or surrounding inflammatory changes. Spleen: Normal in size without focal abnormality. Adrenals/Urinary Tract: Normal adrenal glands. No suspicious kidney mass or hydronephrosis. Urinary bladder appears normal. Stomach/Bowel: Small hiatal hernia. No bowel wall thickening, inflammation or distension. Vascular/Lymphatic: Aortic atherosclerosis. No aneurysm. No abdominopelvic adenopathy. Reproductive: Uterus  and bilateral adnexa are unremarkable. Other: No abdominal wall hernia or abnormality. No abdominopelvic ascites. Musculoskeletal: No acute or significant osseous findings. IMPRESSION: 1. Left breast lesion with surgical clip is identified compatible with known local breast cancer recurrence. 2. No signs of nodal or distant metastatic disease. 3. Small hiatal hernia. Aortic Atherosclerosis (ICD10-I70.0). Electronically Signed   By: Kerby Moors M.D.   On: 04/26/2020 17:08   NM Bone Scan Whole Body  Result Date: 04/28/2020 CLINICAL DATA:  Breast cancer staging EXAM: NUCLEAR MEDICINE WHOLE BODY BONE SCAN TECHNIQUE: Whole body anterior and posterior images were obtained approximately 3 hours after intravenous injection of radiopharmaceutical. RADIOPHARMACEUTICALS:  21.7 mCi Technetium-3m MDP IV COMPARISON:  April 26, 2020 CT, April 25, 2018 FINDINGS: There is similar mild focal uptake at the RIGHT lateral fifth rib, anterior LEFT fifth rib and anterior bilateral seventh ribs. No new suspicious focal uptake. There is radiotracer uptake within the sternoclavicular joints, RIGHT midfoot, bilateral knee, and RIGHT greater than LEFT shoulders This is favored to be degenerative in etiology. Expected physiologic distribution of radiotracer. IMPRESSION: Similar appearance of mild focal uptake within several scattered ribs. This is nonspecific and could be inflammatory in etiology. No new suspicious focal  uptake. Electronically Signed   By: Valentino Saxon MD   On: 04/28/2020 15:26   CT Abdomen Pelvis W Contrast  Result Date: 04/26/2020 CLINICAL DATA:  Restaging locally recurrent left breast cancer. EXAM: CT CHEST, ABDOMEN, AND PELVIS WITH CONTRAST TECHNIQUE: Multidetector CT imaging of the chest, abdomen and pelvis was performed following the standard protocol during bolus administration of intravenous contrast. CONTRAST:  143mL OMNIPAQUE IOHEXOL 300 MG/ML  SOLN COMPARISON:  CT chest 04/19/2018 FINDINGS: CT CHEST FINDINGS Cardiovascular: Normal heart size. No pericardial effusion. Mild aortic atherosclerosis. Mediastinum/Nodes: Previous left axillary node dissection. No enlarged axillary, supraclavicular, mediastinal, or hilar lymph nodes. Normal appearance of the esophagus. The trachea appears patent and is midline. Normal appearance of the thyroid gland. Lungs/Pleura: No pleural effusion. No suspicious pulmonary nodule or mass. Musculoskeletal: Asymmetric left breast nodular soft tissue containing biopsy clips is noted and presumably reflects a combination of postsurgical change and recurrent tumor. No suspicious bone lesions identified. CT ABDOMEN PELVIS FINDINGS Hepatobiliary: No focal liver abnormality is seen. No gallstones, gallbladder wall thickening, or biliary dilatation. Pancreas: Unremarkable. No pancreatic ductal dilatation or surrounding inflammatory changes. Spleen: Normal in size without focal abnormality. Adrenals/Urinary Tract: Normal adrenal glands. No suspicious kidney mass or hydronephrosis. Urinary bladder appears normal. Stomach/Bowel: Small hiatal hernia. No bowel wall thickening, inflammation or distension. Vascular/Lymphatic: Aortic atherosclerosis. No aneurysm. No abdominopelvic adenopathy. Reproductive: Uterus and bilateral adnexa are unremarkable. Other: No abdominal wall hernia or abnormality. No abdominopelvic ascites. Musculoskeletal: No acute or significant osseous findings.  IMPRESSION: 1. Left breast lesion with surgical clip is identified compatible with known local breast cancer recurrence. 2. No signs of nodal or distant metastatic disease. 3. Small hiatal hernia. Aortic Atherosclerosis (ICD10-I70.0). Electronically Signed   By: Kerby Moors M.D.   On: 04/26/2020 17:08   DG Chest Port 1 View  Result Date: 04/25/2020 CLINICAL DATA:  Porta catheter EXAM: PORTABLE CHEST 1 VIEW COMPARISON:  04/28/2018 FINDINGS: Right porta catheter with tip at the SVC. There is no edema, consolidation, effusion, or pneumothorax. Normal heart size and mediastinal contours. Postoperative left breast and axilla. IMPRESSION: Porta catheter without complicating features. Electronically Signed   By: Monte Fantasia M.D.   On: 04/25/2020 09:48   DG C-Arm 1-60 Min-No Report  Result Date: 04/25/2020 Fluoroscopy was  utilized by the requesting physician.  No radiographic interpretation.   MM CLIP PLACEMENT LEFT  Result Date: 04/09/2020 CLINICAL DATA:  60 year old female status post 2 area ultrasound-guided biopsy of the left breast. EXAM: DIAGNOSTIC LEFT MAMMOGRAM POST ULTRASOUND BIOPSY COMPARISON:  Previous exam(s). FINDINGS: Mammographic images were obtained following ultrasound guided biopsy of the left breast x2. Both biopsy marking clips are in the expected position. IMPRESSION: Appropriate positioning of both post biopsy marking clips in the lateral left breast. Final Assessment: Post Procedure Mammograms for Marker Placement Electronically Signed   By: Kristopher Oppenheim M.D.   On: 04/09/2020 14:59   [place] LT BREAST BX W LOC DEV 1ST LESION IMG BX SPEC US GUIDE  Addendum Date: 04/10/2020   ADDENDUM REPORT: 04/10/2020 12:55 ADDENDUM: Pathology revealed GRADE III INVASIVE DUCTAL CARCINOMA of the Left breast, 3 o'clock, 1cmfn (ribbon clip). This was found to be concordant by Dr. Kristopher Oppenheim. Pathology revealed GRADE III INVASIVE DUCTAL CARCINOMA of the Left breast, 3 o'clock, 1cmfn (coil clip). This  was found to be concordant by Dr. Kristopher Oppenheim. Pathology results were discussed with the patient by telephone. The patient reported doing well after the biopsies with tenderness at the sites. Post biopsy instructions and care were reviewed and questions were answered. The patient was encouraged to call [facility] for any additional concerns. My direct phone number was provided. Surgical consultation has been arranged with Dr. Erroll Luna at [facility] on April 19, 2020. A medical oncology referral is being arrange with Dr. Tressa Busman at [facility]. Pathology results reported by Terie Purser, RN on 04/10/2020. Electronically Signed   By: Kristopher Oppenheim M.D.   On: 04/10/2020 12:55   Result Date: 04/10/2020 CLINICAL DATA:  60 year old female with 2 suspicious left breast masses with inner lumpectomy bed. EXAM: ULTRASOUND GUIDED LEFT BREAST CORE NEEDLE BIOPSY COMPARISON:  Previous exam(s). PROCEDURE: I met with the patient and we discussed the procedure of ultrasound-guided biopsy, including benefits and alternatives. We discussed the high likelihood of a successful procedure. We discussed the risks of the procedure, including infection, bleeding, tissue injury, clip migration, and inadequate sampling. Informed written consent was given. The usual time-out protocol was performed immediately prior to the procedure. Lesion quadrant: Upper outer quadrant Using sterile technique and 1% Lidocaine as local anesthetic, under direct ultrasound visualization, a 14 gauge spring-loaded device was used to perform biopsy of a large mass at the 3 o'clock position using a inferior approach. At the conclusion of the procedure a ribbon shaped tissue marker clip was deployed into the biopsy cavity. Lesion quadrant: Upper outer quadrant Using sterile technique and 1% Lidocaine as local anesthetic, under direct ultrasound visualization, a 14 gauge spring-loaded device was  used to perform biopsy of the smaller mass at the 3 o'clock position using a inferior approach. At the conclusion of the procedure a coil shaped tissue marker clip was deployed into the biopsy cavity. Follow up 2 view mammogram was performed and dictated separately. IMPRESSION: Ultrasound guided biopsy of the left breast x2. No apparent complications. Electronically Signed: By: Kristopher Oppenheim M.D. On: 04/09/2020 14:53   [place] LT BREAST BX W LOC DEV EA ADD LESION IMG BX SPEC US GUIDE  Addendum Date: 04/10/2020   ADDENDUM REPORT: 04/10/2020 12:55 ADDENDUM: Pathology revealed GRADE III INVASIVE DUCTAL CARCINOMA of the Left breast, 3 o'clock, 1cmfn (ribbon clip). This was found to be concordant by Dr. Kristopher Oppenheim. Pathology revealed GRADE III INVASIVE DUCTAL CARCINOMA of  the Left breast, 3 o'clock, 1cmfn (coil clip). This was found to be concordant by Dr. Kristopher Oppenheim. Pathology results were discussed with the patient by telephone. The patient reported doing well after the biopsies with tenderness at the sites. Post biopsy instructions and care were reviewed and questions were answered. The patient was encouraged to call [facility] for any additional concerns. My direct phone number was provided. Surgical consultation has been arranged with Dr. Erroll Luna at [facility] on April 19, 2020. A medical oncology referral is being arrange with Dr. Tressa Busman at [facility] Pathology results reported by Terie Purser, RN on 04/10/2020. Electronically Signed   By: Kristopher Oppenheim M.D.   On: 04/10/2020 12:55   Result Date: 04/10/2020 CLINICAL DATA:  60 year old female with 2 suspicious left breast masses with inner lumpectomy bed. EXAM: ULTRASOUND GUIDED LEFT BREAST CORE NEEDLE BIOPSY COMPARISON:  Previous exam(s). PROCEDURE: I met with the patient and we discussed the procedure of ultrasound-guided biopsy, including benefits and alternatives. We discussed the  high likelihood of a successful procedure. We discussed the risks of the procedure, including infection, bleeding, tissue injury, clip migration, and inadequate sampling. Informed written consent was given. The usual time-out protocol was performed immediately prior to the procedure. Lesion quadrant: Upper outer quadrant Using sterile technique and 1% Lidocaine as local anesthetic, under direct ultrasound visualization, a 14 gauge spring-loaded device was used to perform biopsy of a large mass at the 3 o'clock position using a inferior approach. At the conclusion of the procedure a ribbon shaped tissue marker clip was deployed into the biopsy cavity. Lesion quadrant: Upper outer quadrant Using sterile technique and 1% Lidocaine as local anesthetic, under direct ultrasound visualization, a 14 gauge spring-loaded device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

## 2020-05-07 ENCOUNTER — Other Ambulatory Visit: Payer: Self-pay

## 2020-05-07 ENCOUNTER — Encounter: Payer: Self-pay | Admitting: Oncology

## 2020-05-07 ENCOUNTER — Inpatient Hospital Stay: Payer: BC Managed Care – PPO

## 2020-05-07 ENCOUNTER — Encounter: Payer: Self-pay | Admitting: *Deleted

## 2020-05-07 ENCOUNTER — Inpatient Hospital Stay: Payer: BC Managed Care – PPO | Admitting: Oncology

## 2020-05-07 VITALS — BP 114/74 | HR 123 | Temp 97.4°F | Resp 17 | Ht 64.0 in | Wt 154.8 lb

## 2020-05-07 DIAGNOSIS — C50412 Malignant neoplasm of upper-outer quadrant of left female breast: Secondary | ICD-10-CM

## 2020-05-07 DIAGNOSIS — Z171 Estrogen receptor negative status [ER-]: Secondary | ICD-10-CM

## 2020-05-07 DIAGNOSIS — Z801 Family history of malignant neoplasm of trachea, bronchus and lung: Secondary | ICD-10-CM | POA: Diagnosis not present

## 2020-05-07 DIAGNOSIS — Z95828 Presence of other vascular implants and grafts: Secondary | ICD-10-CM

## 2020-05-07 DIAGNOSIS — Z9221 Personal history of antineoplastic chemotherapy: Secondary | ICD-10-CM | POA: Diagnosis not present

## 2020-05-07 DIAGNOSIS — Z5111 Encounter for antineoplastic chemotherapy: Secondary | ICD-10-CM | POA: Diagnosis not present

## 2020-05-07 DIAGNOSIS — M810 Age-related osteoporosis without current pathological fracture: Secondary | ICD-10-CM | POA: Diagnosis not present

## 2020-05-07 DIAGNOSIS — Z79899 Other long term (current) drug therapy: Secondary | ICD-10-CM | POA: Diagnosis not present

## 2020-05-07 DIAGNOSIS — Z923 Personal history of irradiation: Secondary | ICD-10-CM | POA: Diagnosis not present

## 2020-05-07 DIAGNOSIS — Z5112 Encounter for antineoplastic immunotherapy: Secondary | ICD-10-CM | POA: Diagnosis not present

## 2020-05-07 DIAGNOSIS — Z8051 Family history of malignant neoplasm of kidney: Secondary | ICD-10-CM | POA: Diagnosis not present

## 2020-05-07 DIAGNOSIS — Z8249 Family history of ischemic heart disease and other diseases of the circulatory system: Secondary | ICD-10-CM | POA: Diagnosis not present

## 2020-05-07 LAB — CBC WITH DIFFERENTIAL/PLATELET
Abs Immature Granulocytes: 0.1 10*3/uL — ABNORMAL HIGH (ref 0.00–0.07)
Band Neutrophils: 8 %
Basophils Absolute: 0 10*3/uL (ref 0.0–0.1)
Basophils Relative: 0 %
Eosinophils Absolute: 0 10*3/uL (ref 0.0–0.5)
Eosinophils Relative: 1 %
HCT: 37.3 % (ref 36.0–46.0)
Hemoglobin: 12.5 g/dL (ref 12.0–15.0)
Lymphocytes Relative: 37 %
Lymphs Abs: 0.7 10*3/uL (ref 0.7–4.0)
MCH: 31.3 pg (ref 26.0–34.0)
MCHC: 33.5 g/dL (ref 30.0–36.0)
MCV: 93.5 fL (ref 80.0–100.0)
Metamyelocytes Relative: 3 %
Monocytes Absolute: 0.5 10*3/uL (ref 0.1–1.0)
Monocytes Relative: 25 %
Myelocytes: 4 %
Neutro Abs: 0.6 10*3/uL — ABNORMAL LOW (ref 1.7–7.7)
Neutrophils Relative %: 22 %
Platelets: 104 10*3/uL — ABNORMAL LOW (ref 150–400)
RBC: 3.99 MIL/uL (ref 3.87–5.11)
RDW: 13.1 % (ref 11.5–15.5)
WBC: 2 10*3/uL — ABNORMAL LOW (ref 4.0–10.5)
nRBC: 0 % (ref 0.0–0.2)

## 2020-05-07 LAB — COMPREHENSIVE METABOLIC PANEL WITH GFR
ALT: 16 U/L (ref 0–44)
AST: 13 U/L — ABNORMAL LOW (ref 15–41)
Albumin: 3.2 g/dL — ABNORMAL LOW (ref 3.5–5.0)
Alkaline Phosphatase: 60 U/L (ref 38–126)
Anion gap: 9 (ref 5–15)
BUN: 14 mg/dL (ref 6–20)
CO2: 24 mmol/L (ref 22–32)
Calcium: 8.5 mg/dL — ABNORMAL LOW (ref 8.9–10.3)
Chloride: 103 mmol/L (ref 98–111)
Creatinine, Ser: 0.67 mg/dL (ref 0.44–1.00)
GFR, Estimated: >60 mL/min
Glucose, Bld: 102 mg/dL — ABNORMAL HIGH (ref 70–99)
Potassium: 4 mmol/L (ref 3.5–5.1)
Sodium: 136 mmol/L (ref 135–145)
Total Bilirubin: 0.8 mg/dL (ref 0.3–1.2)
Total Protein: 6.3 g/dL — ABNORMAL LOW (ref 6.5–8.1)

## 2020-05-07 MED ORDER — ONDANSETRON HCL 8 MG PO TABS: 8.0000 mg | ORAL_TABLET | Freq: Two times a day (BID) | ORAL | 2 refills | Status: DC | PRN

## 2020-05-07 MED ORDER — OMEPRAZOLE 40 MG PO CPDR: 40.0000 mg | DELAYED_RELEASE_CAPSULE | Freq: Every day | ORAL | 1 refills | Status: DC

## 2020-05-07 MED ORDER — HEPARIN SOD (PORK) LOCK FLUSH 100 UNIT/ML IV SOLN
500.0000 [IU] | Freq: Once | INTRAVENOUS | Status: DC
  Filled 2020-05-07: qty 5

## 2020-05-07 MED ORDER — POLYETHYLENE GLYCOL 3350 17 GM/SCOOP PO POWD: 17.0000 g | Freq: Once | ORAL | 0 refills | Status: AC

## 2020-05-07 MED ORDER — FLUCONAZOLE 100 MG PO TABS: 100.0000 mg | ORAL_TABLET | Freq: Every day | ORAL | 1 refills | Status: DC

## 2020-05-07 MED ORDER — SODIUM CHLORIDE 0.9% FLUSH
10.0000 mL | INTRAVENOUS | Status: DC | PRN
  Filled 2020-05-07: qty 10

## 2020-05-07 MED ORDER — DOCUSATE SODIUM 100 MG PO CAPS: 200.0000 mg | ORAL_CAPSULE | Freq: Two times a day (BID) | ORAL | 6 refills | Status: DC

## 2020-05-07 NOTE — Progress Notes (Signed)
Pt is approved for the $1000 [employer] grant.  

## 2020-05-08 ENCOUNTER — Telehealth: Payer: Self-pay

## 2020-05-08 ENCOUNTER — Encounter: Payer: Self-pay | Admitting: Oncology

## 2020-05-08 NOTE — Telephone Encounter (Signed)
Contacted pt regarding her my chart message. Let her know it is ok to start diflucan now and then with each treatment. Pt verbalized understanding.

## 2020-05-09 ENCOUNTER — Encounter: Payer: Self-pay | Admitting: Oncology

## 2020-05-10 ENCOUNTER — Telehealth: Payer: Self-pay | Admitting: *Deleted

## 2020-05-10 NOTE — Telephone Encounter (Signed)
"  Kathryn [name] and spouse Kathryn [name] calling to talk about disability.  Return call [phone]) or e-mail information to [email or web address]."  Connected with Kathryn [name].  "Wife had cancer, COVID hit, now cancer a second time.  Doctor says she qualifies for disability.  Wish we had used policy two years ago but really could use help.  Having to pay $120.00 co-pay for everything. [employer] family plan is all we have.  Wife employed by a [employer] company ([employer]); we don't believe has FMLA leave or disability.  Has not worked since 04/29/2020, unable to lift boxes.  What can the state do and what information do you need from [place] to begin disability process?"  Contact Employer first to request FMLA, short-term and long-term disability needs.  If employer (PIP) does not provide short or long-term disability to employees, contact [employer] to initiate permanent disability process.   Currently no further questions upon providing [employer] phone and web information, will e-mail "Form [id number] - Application for Disability Insurance Benefits".  Provided Financial Advocate phone number [phone] to call before 2:00 pm for possible oncology foundations wife may obtain assistance through.
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
## 2020-05-21 ENCOUNTER — Inpatient Hospital Stay (HOSPITAL_BASED_OUTPATIENT_CLINIC_OR_DEPARTMENT_OTHER): Payer: BC Managed Care – PPO | Admitting: Oncology

## 2020-05-21 ENCOUNTER — Inpatient Hospital Stay: Payer: BC Managed Care – PPO

## 2020-05-21 ENCOUNTER — Other Ambulatory Visit: Payer: Self-pay

## 2020-05-21 VITALS — BP 137/68 | HR 79 | Temp 97.4°F | Resp 18 | Ht 64.0 in | Wt 160.4 lb

## 2020-05-21 DIAGNOSIS — M810 Age-related osteoporosis without current pathological fracture: Secondary | ICD-10-CM | POA: Diagnosis not present

## 2020-05-21 DIAGNOSIS — Z95828 Presence of other vascular implants and grafts: Secondary | ICD-10-CM

## 2020-05-21 DIAGNOSIS — Z9221 Personal history of antineoplastic chemotherapy: Secondary | ICD-10-CM | POA: Diagnosis not present

## 2020-05-21 DIAGNOSIS — Z171 Estrogen receptor negative status [ER-]: Secondary | ICD-10-CM

## 2020-05-21 DIAGNOSIS — Z8051 Family history of malignant neoplasm of kidney: Secondary | ICD-10-CM | POA: Diagnosis not present

## 2020-05-21 DIAGNOSIS — C50412 Malignant neoplasm of upper-outer quadrant of left female breast: Secondary | ICD-10-CM

## 2020-05-21 DIAGNOSIS — Z801 Family history of malignant neoplasm of trachea, bronchus and lung: Secondary | ICD-10-CM | POA: Diagnosis not present

## 2020-05-21 DIAGNOSIS — Z5111 Encounter for antineoplastic chemotherapy: Secondary | ICD-10-CM | POA: Diagnosis not present

## 2020-05-21 DIAGNOSIS — Z5112 Encounter for antineoplastic immunotherapy: Secondary | ICD-10-CM | POA: Diagnosis not present

## 2020-05-21 DIAGNOSIS — Z923 Personal history of irradiation: Secondary | ICD-10-CM | POA: Diagnosis not present

## 2020-05-21 DIAGNOSIS — Z8249 Family history of ischemic heart disease and other diseases of the circulatory system: Secondary | ICD-10-CM | POA: Diagnosis not present

## 2020-05-21 DIAGNOSIS — Z79899 Other long term (current) drug therapy: Secondary | ICD-10-CM | POA: Diagnosis not present

## 2020-05-21 LAB — COMPREHENSIVE METABOLIC PANEL
ALT: 49 U/L — ABNORMAL HIGH (ref 0–44)
AST: 35 U/L (ref 15–41)
Albumin: 3.6 g/dL (ref 3.5–5.0)
Alkaline Phosphatase: 72 U/L (ref 38–126)
Anion gap: 10 (ref 5–15)
BUN: 12 mg/dL (ref 6–20)
CO2: 22 mmol/L (ref 22–32)
Calcium: 8.8 mg/dL — ABNORMAL LOW (ref 8.9–10.3)
Chloride: 107 mmol/L (ref 98–111)
Creatinine, Ser: 0.63 mg/dL (ref 0.44–1.00)
GFR, Estimated: >60 mL/min (ref >60)
Glucose, Bld: 144 mg/dL — ABNORMAL HIGH (ref 70–99)
Potassium: 4.2 mmol/L (ref 3.5–5.1)
Sodium: 139 mmol/L (ref 135–145)
Total Bilirubin: 0.4 mg/dL (ref 0.3–1.2)
Total Protein: 6.6 g/dL (ref 6.5–8.1)

## 2020-05-21 LAB — CBC WITH DIFFERENTIAL/PLATELET
Abs Immature Granulocytes: 0.04 10*3/uL (ref 0.00–0.07)
Basophils Absolute: 0 10*3/uL (ref 0.0–0.1)
Basophils Relative: 0 %
Eosinophils Absolute: 0 10*3/uL (ref 0.0–0.5)
Eosinophils Relative: 0 %
HCT: 34.7 % — ABNORMAL LOW (ref 36.0–46.0)
Hemoglobin: 11.5 g/dL — ABNORMAL LOW (ref 12.0–15.0)
Immature Granulocytes: 1 %
Lymphocytes Relative: 11 %
Lymphs Abs: 0.9 10*3/uL (ref 0.7–4.0)
MCH: 31.4 pg (ref 26.0–34.0)
MCHC: 33.1 g/dL (ref 30.0–36.0)
MCV: 94.8 fL (ref 80.0–100.0)
Monocytes Absolute: 0.4 10*3/uL (ref 0.1–1.0)
Monocytes Relative: 5 %
Neutro Abs: 6.5 10*3/uL (ref 1.7–7.7)
Neutrophils Relative %: 83 %
Platelets: 230 10*3/uL (ref 150–400)
RBC: 3.66 MIL/uL — ABNORMAL LOW (ref 3.87–5.11)
RDW: 14.7 % (ref 11.5–15.5)
WBC: 7.8 10*3/uL (ref 4.0–10.5)
nRBC: 0 % (ref 0.0–0.2)

## 2020-05-21 MED ORDER — SODIUM CHLORIDE 0.9 % IV SOLN
Freq: Once | INTRAVENOUS | Status: AC
Start: 2020-05-21 — End: 2020-05-21
  Filled 2020-05-21: qty 250

## 2020-05-21 MED ORDER — PALONOSETRON HCL INJECTION 0.25 MG/5ML
0.2500 mg | Freq: Once | INTRAVENOUS | Status: AC
  Administered 2020-05-21: 0.25 mg via INTRAVENOUS

## 2020-05-21 MED ORDER — PALONOSETRON HCL INJECTION 0.25 MG/5ML
INTRAVENOUS | Status: AC
  Filled 2020-05-21: qty 5

## 2020-05-21 MED ORDER — CIPROFLOXACIN HCL 500 MG PO TABS: 500.0000 mg | ORAL_TABLET | Freq: Two times a day (BID) | ORAL | 0 refills | Status: DC

## 2020-05-21 MED ORDER — HEPARIN SOD (PORK) LOCK FLUSH 100 UNIT/ML IV SOLN
500.0000 [IU] | Freq: Once | INTRAVENOUS | Status: AC | PRN
  Administered 2020-05-21: 500 [IU]
  Filled 2020-05-21: qty 5

## 2020-05-21 MED ORDER — SODIUM CHLORIDE 0.9 % IV SOLN
600.0000 mg/m2 | Freq: Once | INTRAVENOUS | Status: AC
  Administered 2020-05-21: 1100 mg via INTRAVENOUS
  Filled 2020-05-21: qty 55

## 2020-05-21 MED ORDER — SODIUM CHLORIDE 0.9% FLUSH
10.0000 mL | INTRAVENOUS | Status: DC | PRN
  Administered 2020-05-21: 10 mL via INTRAVENOUS
  Filled 2020-05-21: qty 10

## 2020-05-21 MED ORDER — SODIUM CHLORIDE 0.9 % IV SOLN
10.0000 mg | Freq: Once | INTRAVENOUS | Status: AC
  Administered 2020-05-21: 10 mg via INTRAVENOUS
  Filled 2020-05-21: qty 1

## 2020-05-21 MED ORDER — SODIUM CHLORIDE 0.9 % IV SOLN
75.0000 mg/m2 | Freq: Once | INTRAVENOUS | Status: AC
  Administered 2020-05-21: 140 mg via INTRAVENOUS
  Filled 2020-05-21: qty 14

## 2020-05-21 MED ORDER — SODIUM CHLORIDE 0.9% FLUSH
10.0000 mL | INTRAVENOUS | Status: DC | PRN
  Administered 2020-05-21: 10 mL
  Filled 2020-05-21: qty 10

## 2020-05-21 NOTE — Patient Instructions (Signed)
[facility] Discharge Instructions for Patients Receiving Chemotherapy  Today you received the following chemotherapy agents: Taxotere, Cytoxan  To help prevent nausea and vomiting after your treatment, we encourage you to take your nausea medication as directed.   If you develop nausea and vomiting that is not controlled by your nausea medication, call the clinic.   BELOW ARE SYMPTOMS THAT SHOULD BE REPORTED IMMEDIATELY:  *FEVER GREATER THAN 100.5 F  *CHILLS WITH OR WITHOUT FEVER  NAUSEA AND VOMITING THAT IS NOT CONTROLLED WITH YOUR NAUSEA MEDICATION  *UNUSUAL SHORTNESS OF BREATH  *UNUSUAL BRUISING OR BLEEDING  TENDERNESS IN MOUTH AND THROAT WITH OR WITHOUT PRESENCE OF ULCERS  *URINARY PROBLEMS  *BOWEL PROBLEMS  UNUSUAL RASH Items with * indicate a potential emergency and should be followed up as soon as possible.  Feel free to call the clinic should you have any questions or concerns. The clinic phone number is [phone].  Please show the CHEMO ALERT CARD at check-in to the Emergency Department and triage nurse.   

## 2020-05-23 ENCOUNTER — Inpatient Hospital Stay: Payer: BC Managed Care – PPO

## 2020-05-23 ENCOUNTER — Other Ambulatory Visit: Payer: Self-pay

## 2020-05-23 VITALS — BP 124/82 | HR 80

## 2020-05-23 DIAGNOSIS — Z79899 Other long term (current) drug therapy: Secondary | ICD-10-CM | POA: Diagnosis not present

## 2020-05-23 DIAGNOSIS — Z8051 Family history of malignant neoplasm of kidney: Secondary | ICD-10-CM | POA: Diagnosis not present

## 2020-05-23 DIAGNOSIS — Z8249 Family history of ischemic heart disease and other diseases of the circulatory system: Secondary | ICD-10-CM | POA: Diagnosis not present

## 2020-05-23 DIAGNOSIS — Z801 Family history of malignant neoplasm of trachea, bronchus and lung: Secondary | ICD-10-CM | POA: Diagnosis not present

## 2020-05-23 DIAGNOSIS — C50412 Malignant neoplasm of upper-outer quadrant of left female breast: Secondary | ICD-10-CM | POA: Diagnosis not present

## 2020-05-23 DIAGNOSIS — Z923 Personal history of irradiation: Secondary | ICD-10-CM | POA: Diagnosis not present

## 2020-05-23 DIAGNOSIS — Z9221 Personal history of antineoplastic chemotherapy: Secondary | ICD-10-CM | POA: Diagnosis not present

## 2020-05-23 DIAGNOSIS — Z5111 Encounter for antineoplastic chemotherapy: Secondary | ICD-10-CM | POA: Diagnosis not present

## 2020-05-23 DIAGNOSIS — Z5112 Encounter for antineoplastic immunotherapy: Secondary | ICD-10-CM | POA: Diagnosis not present

## 2020-05-23 DIAGNOSIS — Z171 Estrogen receptor negative status [ER-]: Secondary | ICD-10-CM | POA: Diagnosis not present

## 2020-05-23 DIAGNOSIS — M810 Age-related osteoporosis without current pathological fracture: Secondary | ICD-10-CM | POA: Diagnosis not present

## 2020-05-23 MED ORDER — PEGFILGRASTIM-CBQV 6 MG/0.6ML ~~LOC~~ SOSY
PREFILLED_SYRINGE | SUBCUTANEOUS | Status: AC
  Filled 2020-05-23: qty 0.6

## 2020-05-23 MED ORDER — PEGFILGRASTIM-CBQV 6 MG/0.6ML ~~LOC~~ SOSY
6.0000 mg | PREFILLED_SYRINGE | Freq: Once | SUBCUTANEOUS | Status: AC
  Administered 2020-05-23: 6 mg via SUBCUTANEOUS

## 2020-06-02 ENCOUNTER — Encounter: Payer: Self-pay | Admitting: Oncology

## 2020-06-07 ENCOUNTER — Telehealth: Payer: Self-pay | Admitting: Oncology

## 2020-06-07 ENCOUNTER — Other Ambulatory Visit: Payer: Self-pay | Admitting: Oncology

## 2020-06-07 NOTE — Telephone Encounter (Signed)
Scheduled appts per 4/15 sch msg. Called pt, no answer. Left msg with appts date and times.

## 2020-06-11 ENCOUNTER — Encounter: Payer: Self-pay | Admitting: *Deleted

## 2020-06-11 ENCOUNTER — Inpatient Hospital Stay: Payer: BC Managed Care – PPO | Attending: Oncology

## 2020-06-11 ENCOUNTER — Inpatient Hospital Stay: Payer: BC Managed Care – PPO

## 2020-06-11 ENCOUNTER — Other Ambulatory Visit: Payer: Self-pay

## 2020-06-11 VITALS — BP 136/79 | HR 78 | Temp 97.8°F | Resp 18 | Ht 64.0 in | Wt 161.2 lb

## 2020-06-11 DIAGNOSIS — L270 Generalized skin eruption due to drugs and medicaments taken internally: Secondary | ICD-10-CM | POA: Diagnosis not present

## 2020-06-11 DIAGNOSIS — Z9221 Personal history of antineoplastic chemotherapy: Secondary | ICD-10-CM | POA: Insufficient documentation

## 2020-06-11 DIAGNOSIS — Z79899 Other long term (current) drug therapy: Secondary | ICD-10-CM | POA: Diagnosis not present

## 2020-06-11 DIAGNOSIS — Z171 Estrogen receptor negative status [ER-]: Secondary | ICD-10-CM

## 2020-06-11 DIAGNOSIS — C50412 Malignant neoplasm of upper-outer quadrant of left female breast: Secondary | ICD-10-CM | POA: Diagnosis not present

## 2020-06-11 DIAGNOSIS — T451X5A Adverse effect of antineoplastic and immunosuppressive drugs, initial encounter: Secondary | ICD-10-CM | POA: Diagnosis not present

## 2020-06-11 DIAGNOSIS — Z8051 Family history of malignant neoplasm of kidney: Secondary | ICD-10-CM | POA: Diagnosis not present

## 2020-06-11 DIAGNOSIS — E860 Dehydration: Secondary | ICD-10-CM | POA: Insufficient documentation

## 2020-06-11 DIAGNOSIS — Z5111 Encounter for antineoplastic chemotherapy: Secondary | ICD-10-CM | POA: Insufficient documentation

## 2020-06-11 DIAGNOSIS — Z5189 Encounter for other specified aftercare: Secondary | ICD-10-CM | POA: Insufficient documentation

## 2020-06-11 DIAGNOSIS — Z95828 Presence of other vascular implants and grafts: Secondary | ICD-10-CM

## 2020-06-11 DIAGNOSIS — Z801 Family history of malignant neoplasm of trachea, bronchus and lung: Secondary | ICD-10-CM | POA: Diagnosis not present

## 2020-06-11 DIAGNOSIS — Z923 Personal history of irradiation: Secondary | ICD-10-CM | POA: Insufficient documentation

## 2020-06-11 DIAGNOSIS — Z808 Family history of malignant neoplasm of other organs or systems: Secondary | ICD-10-CM | POA: Diagnosis not present

## 2020-06-11 LAB — COMPREHENSIVE METABOLIC PANEL
ALT: 23 U/L (ref 0–44)
AST: 20 U/L (ref 15–41)
Albumin: 3.7 g/dL (ref 3.5–5.0)
Alkaline Phosphatase: 58 U/L (ref 38–126)
Anion gap: 9 (ref 5–15)
BUN: 11 mg/dL (ref 6–20)
CO2: 23 mmol/L (ref 22–32)
Calcium: 8.7 mg/dL — ABNORMAL LOW (ref 8.9–10.3)
Chloride: 109 mmol/L (ref 98–111)
Creatinine, Ser: 0.63 mg/dL (ref 0.44–1.00)
GFR, Estimated: >60 mL/min (ref >60)
Glucose, Bld: 139 mg/dL — ABNORMAL HIGH (ref 70–99)
Potassium: 4 mmol/L (ref 3.5–5.1)
Sodium: 141 mmol/L (ref 135–145)
Total Bilirubin: 0.6 mg/dL (ref 0.3–1.2)
Total Protein: 6.6 g/dL (ref 6.5–8.1)

## 2020-06-11 LAB — CBC WITH DIFFERENTIAL/PLATELET
Abs Immature Granulocytes: 0.02 10*3/uL (ref 0.00–0.07)
Basophils Absolute: 0 10*3/uL (ref 0.0–0.1)
Basophils Relative: 0 %
Eosinophils Absolute: 0 10*3/uL (ref 0.0–0.5)
Eosinophils Relative: 0 %
HCT: 34 % — ABNORMAL LOW (ref 36.0–46.0)
Hemoglobin: 10.9 g/dL — ABNORMAL LOW (ref 12.0–15.0)
Immature Granulocytes: 0 %
Lymphocytes Relative: 13 %
Lymphs Abs: 0.7 10*3/uL (ref 0.7–4.0)
MCH: 31.1 pg (ref 26.0–34.0)
MCHC: 32.1 g/dL (ref 30.0–36.0)
MCV: 97.1 fL (ref 80.0–100.0)
Monocytes Absolute: 0.2 10*3/uL (ref 0.1–1.0)
Monocytes Relative: 4 %
Neutro Abs: 4.4 10*3/uL (ref 1.7–7.7)
Neutrophils Relative %: 83 %
Platelets: 217 10*3/uL (ref 150–400)
RBC: 3.5 MIL/uL — ABNORMAL LOW (ref 3.87–5.11)
RDW: 15.8 % — ABNORMAL HIGH (ref 11.5–15.5)
WBC: 5.4 10*3/uL (ref 4.0–10.5)
nRBC: 0 % (ref 0.0–0.2)

## 2020-06-11 MED ORDER — SODIUM CHLORIDE 0.9 % IV SOLN
75.0000 mg/m2 | Freq: Once | INTRAVENOUS | Status: AC
  Administered 2020-06-11: 140 mg via INTRAVENOUS
  Filled 2020-06-11: qty 14

## 2020-06-11 MED ORDER — PALONOSETRON HCL INJECTION 0.25 MG/5ML
INTRAVENOUS | Status: AC
  Filled 2020-06-11: qty 5

## 2020-06-11 MED ORDER — PALONOSETRON HCL INJECTION 0.25 MG/5ML
0.2500 mg | Freq: Once | INTRAVENOUS | Status: AC
  Administered 2020-06-11: 0.25 mg via INTRAVENOUS

## 2020-06-11 MED ORDER — SODIUM CHLORIDE 0.9 % IV SOLN
600.0000 mg/m2 | Freq: Once | INTRAVENOUS | Status: AC
  Administered 2020-06-11: 1100 mg via INTRAVENOUS
  Filled 2020-06-11: qty 55

## 2020-06-11 MED ORDER — SODIUM CHLORIDE 0.9 % IV SOLN
10.0000 mg | Freq: Once | INTRAVENOUS | Status: AC
  Administered 2020-06-11: 10 mg via INTRAVENOUS
  Filled 2020-06-11: qty 10

## 2020-06-11 MED ORDER — SODIUM CHLORIDE 0.9 % IV SOLN
Freq: Once | INTRAVENOUS | Status: AC
  Filled 2020-06-11: qty 250

## 2020-06-11 MED ORDER — SODIUM CHLORIDE 0.9% FLUSH
10.0000 mL | INTRAVENOUS | Status: DC | PRN
  Administered 2020-06-11: 10 mL
  Filled 2020-06-11: qty 10

## 2020-06-11 MED ORDER — SODIUM CHLORIDE 0.9% FLUSH
10.0000 mL | INTRAVENOUS | Status: DC | PRN
  Administered 2020-06-11: 10 mL via INTRAVENOUS
  Filled 2020-06-11: qty 10

## 2020-06-11 MED ORDER — HEPARIN SOD (PORK) LOCK FLUSH 100 UNIT/ML IV SOLN
500.0000 [IU] | Freq: Once | INTRAVENOUS | Status: AC | PRN
  Administered 2020-06-11: 500 [IU]
  Filled 2020-06-11: qty 5

## 2020-06-11 NOTE — Patient Instructions (Signed)
[facility] Discharge Instructions for Patients Receiving Chemotherapy  Today you received the following chemotherapy agents: Taxotere, Cytoxan  To help prevent nausea and vomiting after your treatment, we encourage you to take your nausea medication as directed.   If you develop nausea and vomiting that is not controlled by your nausea medication, call the clinic.   BELOW ARE SYMPTOMS THAT SHOULD BE REPORTED IMMEDIATELY:  *FEVER GREATER THAN 100.5 F  *CHILLS WITH OR WITHOUT FEVER  NAUSEA AND VOMITING THAT IS NOT CONTROLLED WITH YOUR NAUSEA MEDICATION  *UNUSUAL SHORTNESS OF BREATH  *UNUSUAL BRUISING OR BLEEDING  TENDERNESS IN MOUTH AND THROAT WITH OR WITHOUT PRESENCE OF ULCERS  *URINARY PROBLEMS  *BOWEL PROBLEMS  UNUSUAL RASH Items with * indicate a potential emergency and should be followed up as soon as possible.  Feel free to call the clinic should you have any questions or concerns. The clinic phone number is [phone].  Please show the CHEMO ALERT CARD at check-in to the Emergency Department and triage nurse.   

## 2020-06-13 ENCOUNTER — Other Ambulatory Visit: Payer: Self-pay

## 2020-06-13 ENCOUNTER — Inpatient Hospital Stay: Payer: BC Managed Care – PPO

## 2020-06-13 VITALS — BP 118/79 | HR 94 | Resp 18

## 2020-06-13 DIAGNOSIS — Z5189 Encounter for other specified aftercare: Secondary | ICD-10-CM | POA: Diagnosis not present

## 2020-06-13 DIAGNOSIS — Z79899 Other long term (current) drug therapy: Secondary | ICD-10-CM | POA: Diagnosis not present

## 2020-06-13 DIAGNOSIS — Z5111 Encounter for antineoplastic chemotherapy: Secondary | ICD-10-CM | POA: Diagnosis not present

## 2020-06-13 DIAGNOSIS — Z801 Family history of malignant neoplasm of trachea, bronchus and lung: Secondary | ICD-10-CM | POA: Diagnosis not present

## 2020-06-13 DIAGNOSIS — Z8051 Family history of malignant neoplasm of kidney: Secondary | ICD-10-CM | POA: Diagnosis not present

## 2020-06-13 DIAGNOSIS — C50412 Malignant neoplasm of upper-outer quadrant of left female breast: Secondary | ICD-10-CM

## 2020-06-13 DIAGNOSIS — T451X5A Adverse effect of antineoplastic and immunosuppressive drugs, initial encounter: Secondary | ICD-10-CM | POA: Diagnosis not present

## 2020-06-13 DIAGNOSIS — Z9221 Personal history of antineoplastic chemotherapy: Secondary | ICD-10-CM | POA: Diagnosis not present

## 2020-06-13 DIAGNOSIS — Z808 Family history of malignant neoplasm of other organs or systems: Secondary | ICD-10-CM | POA: Diagnosis not present

## 2020-06-13 DIAGNOSIS — L270 Generalized skin eruption due to drugs and medicaments taken internally: Secondary | ICD-10-CM | POA: Diagnosis not present

## 2020-06-13 DIAGNOSIS — Z171 Estrogen receptor negative status [ER-]: Secondary | ICD-10-CM | POA: Diagnosis not present

## 2020-06-13 DIAGNOSIS — E860 Dehydration: Secondary | ICD-10-CM | POA: Diagnosis not present

## 2020-06-13 DIAGNOSIS — Z923 Personal history of irradiation: Secondary | ICD-10-CM | POA: Diagnosis not present

## 2020-06-13 MED ORDER — PEGFILGRASTIM-CBQV 6 MG/0.6ML ~~LOC~~ SOSY
6.0000 mg | PREFILLED_SYRINGE | Freq: Once | SUBCUTANEOUS | Status: AC
  Administered 2020-06-13: 6 mg via SUBCUTANEOUS

## 2020-06-13 MED ORDER — PEGFILGRASTIM-CBQV 6 MG/0.6ML ~~LOC~~ SOSY
PREFILLED_SYRINGE | SUBCUTANEOUS | Status: AC
  Filled 2020-06-13: qty 0.6

## 2020-06-13 NOTE — Patient Instructions (Signed)
Pegfilgrastim injection What is this medicine? PEGFILGRASTIM (PEG fil gra stim) is a long-acting granulocyte colony-stimulating factor that stimulates the growth of neutrophils, a type of white blood cell important in the body's fight against infection. It is used to reduce the incidence of fever and infection in patients with certain types of cancer who are receiving chemotherapy that affects the bone marrow, and to increase survival after being exposed to high doses of radiation. This medicine may be used for other purposes; ask your health care provider or pharmacist if you have questions. COMMON BRAND NAME(S): Fulphila, Neulasta, Nyvepria, UDENYCA, Ziextenzo What should I tell my health care provider before I take this medicine? They need to know if you have any of these conditions:  kidney disease  latex allergy  ongoing radiation therapy  sickle cell disease  skin reactions to acrylic adhesives (On-Body Injector only)  an unusual or allergic reaction to pegfilgrastim, filgrastim, other medicines, foods, dyes, or preservatives  pregnant or trying to get pregnant  breast-feeding How should I use this medicine? This medicine is for injection under the skin. If you get this medicine at home, you will be taught how to prepare and give the pre-filled syringe or how to use the On-body Injector. Refer to the patient Instructions for Use for detailed instructions. Use exactly as directed. Tell your healthcare provider immediately if you suspect that the On-body Injector may not have performed as intended or if you suspect the use of the On-body Injector resulted in a missed or partial dose. It is important that you put your used needles and syringes in a special sharps container. Do not put them in a trash can. If you do not have a sharps container, call your pharmacist or healthcare provider to get one. Talk to your pediatrician regarding the use of this medicine in children. While this drug  may be prescribed for selected conditions, precautions do apply. Overdosage: If you think you have taken too much of this medicine contact a poison control center or emergency room at once. NOTE: This medicine is only for you. Do not share this medicine with others. What if I miss a dose? It is important not to miss your dose. Call your doctor or health care professional if you miss your dose. If you miss a dose due to an On-body Injector failure or leakage, a new dose should be administered as soon as possible using a single prefilled syringe for manual use. What may interact with this medicine? Interactions have not been studied. This list may not describe all possible interactions. Give your health care provider a list of all the medicines, herbs, non-prescription drugs, or dietary supplements you use. Also tell them if you smoke, drink alcohol, or use illegal drugs. Some items may interact with your medicine. What should I watch for while using this medicine? Your condition will be monitored carefully while you are receiving this medicine. You may need blood work done while you are taking this medicine. Talk to your health care provider about your risk of cancer. You may be more at risk for certain types of cancer if you take this medicine. If you are going to need a MRI, CT scan, or other procedure, tell your doctor that you are using this medicine (On-Body Injector only). What side effects may I notice from receiving this medicine? Side effects that you should report to your doctor or health care professional as soon as possible:  allergic reactions (skin rash, itching or hives, swelling of   the face, lips, or tongue)  back pain  dizziness  fever  pain, redness, or irritation at site where injected  pinpoint red spots on the skin  red or dark-brown urine  shortness of breath or breathing problems  stomach or side pain, or pain at the shoulder  swelling  tiredness  trouble  passing urine or change in the amount of urine  unusual bruising or bleeding Side effects that usually do not require medical attention (report to your doctor or health care professional if they continue or are bothersome):  bone pain  muscle pain This list may not describe all possible side effects. Call your doctor for medical advice about side effects. You may report side effects to FDA at 1-800-FDA-1088. Where should I keep my medicine? Keep out of the reach of children. If you are using this medicine at home, you will be instructed on how to store it. Throw away any unused medicine after the expiration date on the label. NOTE: This sheet is a summary. It may not cover all possible information. If you have questions about this medicine, talk to your doctor, pharmacist, or health care provider.  2021 Elsevier/Gold Standard (2019-03-03 13:20:51)  

## 2020-06-17 ENCOUNTER — Encounter: Payer: Self-pay | Admitting: Oncology

## 2020-06-18 ENCOUNTER — Telehealth: Payer: Self-pay

## 2020-06-18 NOTE — Telephone Encounter (Signed)
Pt called stating she has a rash to (r) forearm that "popped up" 06/09/20. Pt states it is red and inflamed and upon checking her oral temp it was 99.1 F. Per Sandi Mealy, PA pt should be seen tomorrow morning at 1000. Pt scheduled and verbalized thanks and understanding.

## 2020-06-19 ENCOUNTER — Inpatient Hospital Stay (HOSPITAL_BASED_OUTPATIENT_CLINIC_OR_DEPARTMENT_OTHER): Payer: BC Managed Care – PPO | Admitting: Medical

## 2020-06-19 ENCOUNTER — Encounter: Payer: Self-pay | Admitting: Medical

## 2020-06-19 ENCOUNTER — Encounter: Payer: Self-pay | Admitting: Oncology

## 2020-06-19 ENCOUNTER — Other Ambulatory Visit: Payer: Self-pay

## 2020-06-19 VITALS — BP 140/79 | HR 104 | Temp 97.6°F | Resp 20

## 2020-06-19 DIAGNOSIS — Z8051 Family history of malignant neoplasm of kidney: Secondary | ICD-10-CM | POA: Diagnosis not present

## 2020-06-19 DIAGNOSIS — Z171 Estrogen receptor negative status [ER-]: Secondary | ICD-10-CM

## 2020-06-19 DIAGNOSIS — Z923 Personal history of irradiation: Secondary | ICD-10-CM | POA: Diagnosis not present

## 2020-06-19 DIAGNOSIS — Z5111 Encounter for antineoplastic chemotherapy: Secondary | ICD-10-CM | POA: Diagnosis not present

## 2020-06-19 DIAGNOSIS — L270 Generalized skin eruption due to drugs and medicaments taken internally: Secondary | ICD-10-CM

## 2020-06-19 DIAGNOSIS — Z79899 Other long term (current) drug therapy: Secondary | ICD-10-CM | POA: Diagnosis not present

## 2020-06-19 DIAGNOSIS — C50412 Malignant neoplasm of upper-outer quadrant of left female breast: Secondary | ICD-10-CM | POA: Diagnosis not present

## 2020-06-19 DIAGNOSIS — Z9221 Personal history of antineoplastic chemotherapy: Secondary | ICD-10-CM | POA: Diagnosis not present

## 2020-06-19 DIAGNOSIS — T451X5A Adverse effect of antineoplastic and immunosuppressive drugs, initial encounter: Secondary | ICD-10-CM | POA: Diagnosis not present

## 2020-06-19 DIAGNOSIS — Z808 Family history of malignant neoplasm of other organs or systems: Secondary | ICD-10-CM | POA: Diagnosis not present

## 2020-06-19 DIAGNOSIS — Z5189 Encounter for other specified aftercare: Secondary | ICD-10-CM | POA: Diagnosis not present

## 2020-06-19 DIAGNOSIS — E860 Dehydration: Secondary | ICD-10-CM

## 2020-06-19 DIAGNOSIS — Z801 Family history of malignant neoplasm of trachea, bronchus and lung: Secondary | ICD-10-CM | POA: Diagnosis not present

## 2020-06-19 LAB — CMP (CANCER CENTER ONLY)
ALT: 17 U/L (ref 0–44)
AST: 21 U/L (ref 15–41)
Albumin: 3.6 g/dL (ref 3.5–5.0)
Alkaline Phosphatase: 104 U/L (ref 38–126)
Anion gap: 10 (ref 5–15)
BUN: 12 mg/dL (ref 6–20)
CO2: 23 mmol/L (ref 22–32)
Calcium: 8.6 mg/dL — ABNORMAL LOW (ref 8.9–10.3)
Chloride: 103 mmol/L (ref 98–111)
Creatinine: 0.69 mg/dL (ref 0.44–1.00)
GFR, Estimated: >60 mL/min (ref >60)
Glucose, Bld: 103 mg/dL — ABNORMAL HIGH (ref 70–99)
Potassium: 4 mmol/L (ref 3.5–5.1)
Sodium: 136 mmol/L (ref 135–145)
Total Bilirubin: 0.5 mg/dL (ref 0.3–1.2)
Total Protein: 6.4 g/dL — ABNORMAL LOW (ref 6.5–8.1)

## 2020-06-19 LAB — CBC WITH DIFFERENTIAL (CANCER CENTER ONLY)
Abs Immature Granulocytes: 2.75 10*3/uL — ABNORMAL HIGH (ref 0.00–0.07)
Basophils Absolute: 0.1 10*3/uL (ref 0.0–0.1)
Basophils Relative: 0 %
Eosinophils Absolute: 0.1 10*3/uL (ref 0.0–0.5)
Eosinophils Relative: 0 %
HCT: 34.4 % — ABNORMAL LOW (ref 36.0–46.0)
Hemoglobin: 11.3 g/dL — ABNORMAL LOW (ref 12.0–15.0)
Immature Granulocytes: 12 %
Lymphocytes Relative: 6 %
Lymphs Abs: 1.3 10*3/uL (ref 0.7–4.0)
MCH: 31.3 pg (ref 26.0–34.0)
MCHC: 32.8 g/dL (ref 30.0–36.0)
MCV: 95.3 fL (ref 80.0–100.0)
Monocytes Absolute: 4.3 10*3/uL — ABNORMAL HIGH (ref 0.1–1.0)
Monocytes Relative: 19 %
Neutro Abs: 14.2 10*3/uL — ABNORMAL HIGH (ref 1.7–7.7)
Neutrophils Relative %: 63 %
Platelet Count: 108 10*3/uL — ABNORMAL LOW (ref 150–400)
RBC: 3.61 MIL/uL — ABNORMAL LOW (ref 3.87–5.11)
RDW: 15.5 % (ref 11.5–15.5)
WBC Count: 22.6 10*3/uL — ABNORMAL HIGH (ref 4.0–10.5)
nRBC: 0.6 % — ABNORMAL HIGH (ref 0.0–0.2)

## 2020-06-19 MED ORDER — HEPARIN SOD (PORK) LOCK FLUSH 100 UNIT/ML IV SOLN
500.0000 [IU] | INTRAVENOUS | Status: AC | PRN
  Administered 2020-06-19: 500 [IU]
  Filled 2020-06-19: qty 5

## 2020-06-19 MED ORDER — SODIUM CHLORIDE 0.9 % IV SOLN
INTRAVENOUS | Status: AC
Start: 2020-06-19 — End: 2020-06-19
  Filled 2020-06-19 (×2): qty 250

## 2020-06-19 MED ORDER — SODIUM CHLORIDE 0.9% FLUSH
10.0000 mL | INTRAVENOUS | Status: AC | PRN
  Administered 2020-06-19: 10 mL
  Filled 2020-06-19: qty 10

## 2020-06-19 NOTE — Progress Notes (Signed)
[facility] Progress Note   Kathryn [name] [id number] 05-17-1960 60 y.o.  Kathryn [name] is managed by Dr. Lurline Del  Actively treated with chemotherapy/immunotherapy/hormonal therapy: yes  Current therapy: Docetaxel and Cytoxan with Udenyca support  Last treated: 06/11/2020 (cycle 3, day 1)  Next scheduled appointment with provider: 07/02/2020  Assessment: Plan:    Dehydration - Plan: CBC with Differential ([place] Only), CMP ([place] only), 0.9 %  sodium chloride infusion, CMP ([place] only), CBC with Differential ([place] Only), sodium chloride flush (NS) 0.9 % injection 10 mL, heparin lock flush 100 unit/mL  Malignant neoplasm of upper-outer quadrant of left breast in female, estrogen receptor negative (HCC)  Drug rash   Dehydration: The patient was given 1 L of normal saline IV today.  ER negative malignant neoplasm of the left breast: Kathryn [name] and is status post cycle 3, day 1 docetaxel and Cytoxan with Udenyca support.  She is scheduled to be seen in follow-up on/11/2020.  Drug rash secondary to docetaxel: The patient was given a prescription for triamcinolone lotion.  She was not given a prescription for prednisone taper as she is currently on Cipro.  Once she finishes Cipro she may be given a prednisone taper if her rash does not responded to topical steroids.  She will need to restart Diflucan if she is given a prednisone taper due to her propensity to develop thrush.  Please see After Visit Summary for patient specific instructions.  Future Appointments  Date Time Provider [place]  07/02/2020  9:00 AM [facility] None  07/02/2020  9:30 AM Magrinat, Virgie Dad, MD [facility] None  07/02/2020 10:00 AM [facility] INFUSION [facility] None  07/04/2020 12:30 PM [facility] FLUSH [facility] None  08/08/2020  3:30 PM [facility] LAB [facility] None  08/08/2020  4:00 PM Magrinat, Virgie Dad, MD  [facility] None    Orders Placed This Encounter  Procedures  . CBC with Differential ([place] Only)  . CMP ([place] only)       Subjective:   Patient ID:  Kathryn [name] is a 60 y.o. (DOB 08-30-1960) female.  Chief Complaint: No chief complaint on file.   HPI Kathryn [name]  is a 60 y.o. female with a diagnosis of an ER negative malignant neoplasm of the left breast.  She is managed by Dr. Jana Hakim and is status post cycle 3, day 1 docetaxel and Cytoxan with Udenyca support which was dosed on 06/11/2020.  She presents to clinic today with a pruritic rash over her bilateral upper extremities and posterior neck.  She denies any fevers, chills, sweats, nausea, vomiting, constipation, or diarrhea.  She has had no changes in personal hygiene products or medications.   Medications: I have reviewed the patient's current medications.  Allergies: No Known Allergies  Past Medical History:  Diagnosis Date  . Breast cancer ([place])    stage 3 - left  . Family history of kidney cancer   . Family history of melanoma   . Headache    hormone related, none since 1st child was born   . Personal history of chemotherapy   . Personal history of radiation therapy   . Pleurisy     Past Surgical History:  Procedure Laterality Date  . AXILLARY LYMPH NODE DISSECTION Left 10/11/2018   Procedure: LEFT AXILLARY LYMPH NODE DISSECTION;  Surgeon: Erroll Luna, MD;  Location: [place];  Service: General;  Laterality: Left;  . BREAST LUMPECTOMY Left 08/2018  .  BREAST LUMPECTOMY WITH RADIOACTIVE SEED AND SENTINEL LYMPH NODE BIOPSY Left 09/23/2018   Procedure: LEFT BREAST LUMPECTOMY WITH RADIOACTIVE SEED AND LEFT AXILLARY TARGETED LYMPH NODE BIOPY AND  LEFT AXILLARY SENTINEL LYMPH NODE MAPPING;  Surgeon: Erroll Luna, MD;  Location: [place];  Service: General;  Laterality: Left;  . CESAREAN SECTION     x3  . ORIF HUMERUS FRACTURE Right 01/31/2019   Procedure: OPEN REDUCTION  INTERNAL FIXATION (ORIF) Right 3 part proximal humerus fracture;  Surgeon: Justice Britain, MD;  Location: [facility];  Service: Orthopedics;  Laterality: Right;  126min  . PORT-A-CATH REMOVAL Right 07/18/2019   Procedure: REMOVAL PORT-A-CATH;  Surgeon: Erroll Luna, MD;  Location: [place];  Service: General;  Laterality: Right;  . PORTACATH PLACEMENT N/A 04/28/2018   Procedure: INSERTION PORT-A-CATH WITH ULTRASOUND;  Surgeon: Erroll Luna, MD;  Location: [place];  Service: General;  Laterality: N/A;  . PORTACATH PLACEMENT N/A 04/25/2020   Procedure: INSERTION PORT-A-CATH;  Surgeon: Erroll Luna, MD;  Location: [facility];  Service: General;  Laterality: N/A;  23 MIN IN LDOW PLEASE    Family History  Problem Relation Age of Onset  . Kidney cancer Sister 35       d. 4  . Lung cancer Paternal Uncle   . Melanoma Sister   . Melanoma Sister     Social History   Socioeconomic History  . Marital status: Married    Spouse name: Not on file  . Number of children: Not on file  . Years of education: Not on file  . Highest education level: Not on file  Occupational History  . Not on file  Tobacco Use  . Smoking status: Never Smoker  . Smokeless tobacco: Never Used  Vaping Use  . Vaping Use: Never used  Substance and Sexual Activity  . Alcohol use: Not Currently    Comment: rare  . Drug use: Never  . Sexual activity: Not Currently    Birth control/protection: Post-menopausal  Other Topics Concern  . Not on file  Social History Narrative  . Not on file   Social Determinants of Health   Financial Resource Strain: Not on file  Food Insecurity: Not on file  Transportation Needs: Not on file  Physical Activity: Not on file  Stress: Not on file  Social Connections: Not on file  Intimate Partner Violence: Not on file    Past Medical History, Surgical history, Social history, and Family history were reviewed and updated as appropriate.   Please see review of systems for  further details on the patient's review from today.   Review of Systems:  Review of Systems  Constitutional: Negative for chills, diaphoresis and fever.  HENT: Negative for trouble swallowing and voice change.   Respiratory: Negative for cough, chest tightness, shortness of breath and wheezing.   Cardiovascular: Negative for chest pain and palpitations.  Gastrointestinal: Negative for abdominal pain, constipation, diarrhea, nausea and vomiting.  Musculoskeletal: Negative for back pain and myalgias.  Skin: Positive for color change and rash.  Neurological: Negative for dizziness, light-headedness and headaches.    Objective:   Physical Exam:  BP 140/79 (BP Location: Right Arm, Patient Position: Sitting)   Pulse (!) 104   Temp 97.6 F (36.4 C) (Oral)   Resp 20   SpO2 100%  ECOG: 0  Physical Exam Constitutional:      General: She is not in acute distress.    Appearance: She is not diaphoretic.  HENT:  Head: Normocephalic and atraumatic.  Eyes:     General: No scleral icterus.       Right eye: No discharge.        Left eye: No discharge.     Conjunctiva/sclera: Conjunctivae normal.  Cardiovascular:     Rate and Rhythm: Normal rate and regular rhythm.     Heart sounds: Normal heart sounds. No murmur heard. No friction rub. No gallop.   Pulmonary:     Effort: Pulmonary effort is normal. No respiratory distress.     Breath sounds: Normal breath sounds. No wheezing or rales.  Skin:    General: Skin is warm and dry.     Findings: Erythema and rash present.     Comments: The patient has a erythematous macular rash over her bilateral dorsal forearms over her posterior neck.  Neurological:     Mental Status: She is alert.     Coordination: Coordination normal.     Gait: Gait normal.  Psychiatric:        Mood and Affect: Mood normal.        Behavior: Behavior normal.        Thought Content: Thought content normal.        Judgment: Judgment normal.        Lab  Review:     Component Value Date/Time   NA 136 06/19/2020 1132   K 4.0 06/19/2020 1132   CL 103 06/19/2020 1132   CO2 23 06/19/2020 1132   GLUCOSE 103 (H) 06/19/2020 1132   BUN 12 06/19/2020 1132   CREATININE 0.69 06/19/2020 1132   CALCIUM 8.6 (L) 06/19/2020 1132   PROT 6.4 (L) 06/19/2020 1132   ALBUMIN 3.6 06/19/2020 1132   AST 21 06/19/2020 1132   ALT 17 06/19/2020 1132   ALKPHOS 104 06/19/2020 1132   BILITOT 0.5 06/19/2020 1132   GFRNONAA >60 06/19/2020 1132   GFRAA >60 10/18/2019 1427   GFRAA >60 08/02/2018 0834       Component Value Date/Time   WBC 22.6 (H) 06/19/2020 1132   WBC 5.4 06/11/2020 0850   RBC 3.61 (L) 06/19/2020 1132   HGB 11.3 (L) 06/19/2020 1132   HCT 34.4 (L) 06/19/2020 1132   PLT 108 (L) 06/19/2020 1132   MCV 95.3 06/19/2020 1132   MCH 31.3 06/19/2020 1132   MCHC 32.8 06/19/2020 1132   RDW 15.5 06/19/2020 1132   LYMPHSABS 1.3 06/19/2020 1132   MONOABS 4.3 (H) 06/19/2020 1132   EOSABS 0.1 06/19/2020 1132   BASOSABS 0.1 06/19/2020 1132   -------------------------------  Imaging from last 24 hours (if applicable):  Radiology interpretation: No results found.      This case was discussed with Dr. Jana Hakim. He expressed his agreement with my management of this patient.

## 2020-06-19 NOTE — Patient Instructions (Signed)
Rehydration, Adult Rehydration is the replacement of body fluids, salts, and minerals (electrolytes) that are lost during dehydration. Dehydration is when there is not enough water or other fluids in the body. This happens when you lose more fluids than you take in. Common causes of dehydration include:  Not drinking enough fluids. This can occur when you are ill or doing activities that require a lot of energy, especially in hot weather.  Conditions that cause loss of water or other fluids, such as diarrhea, vomiting, sweating, or urinating a lot.  Other illnesses, such as fever or infection.  Certain medicines, such as those that remove excess fluid from the body (diuretics). Symptoms of mild or moderate dehydration may include thirst, dry lips and mouth, and dizziness. Symptoms of severe dehydration may include increased heart rate, confusion, fainting, and not urinating. For severe dehydration, you may need to get fluids through an IV at the hospital. For mild or moderate dehydration, you can usually rehydrate at home by drinking certain fluids as told by your health care provider. What are the risks? Generally, rehydration is safe. However, taking in too much fluid (overhydration) can be a problem. This is rare. Overhydration can cause an electrolyte imbalance, kidney failure, or a decrease in salt (sodium) levels in the body. Supplies needed You will need an oral rehydration solution (ORS) if your health care provider tells you to use one. This is a drink to treat dehydration. It can be found in pharmacies and retail stores. How to rehydrate Fluids Follow instructions from your health care provider for rehydration. The kind of fluid and the amount you should drink depend on your condition. In general, you should choose drinks that you prefer.  If told by your health care provider, drink an ORS. ? Make an ORS by following instructions on the package. ? Start by drinking small amounts,  about  cup (120 mL) every 5-10 minutes. ? Slowly increase how much you drink until you have taken the amount recommended by your health care provider.  Drink enough clear fluids to keep your urine pale yellow. If you were told to drink an ORS, finish it first, then start slowly drinking other clear fluids. Drink fluids such as: ? Water. This includes sparkling water and flavored water. Drinking only water can lead to having too little sodium in your body (hyponatremia). Follow the advice of your health care provider. ? Water from ice chips you suck on. ? Fruit juice with water you add to it (diluted). ? Sports drinks. ? Hot or cold herbal teas. ? Broth-based soups. ? Milk or milk products. Food Follow instructions from your health care provider about what to eat while you rehydrate. Your health care provider may recommend that you slowly begin eating regular foods in small amounts.  Eat foods that contain a healthy balance of electrolytes, such as bananas, oranges, potatoes, tomatoes, and spinach.  Avoid foods that are greasy or contain a lot of sugar. In some cases, you may get nutrition through a feeding tube that is passed through your nose and into your stomach (nasogastric tube, or NG tube). This may be done if you have uncontrolled vomiting or diarrhea.   Beverages to avoid Certain beverages may make dehydration worse. While you rehydrate, avoid drinking alcohol.   How to tell if you are recovering from dehydration You may be recovering from dehydration if:  You are urinating more often than before you started rehydrating.  Your urine is pale yellow.  Your energy level   improves.  You vomit less frequently.  You have diarrhea less frequently.  Your appetite improves or returns to normal.  You feel less dizzy or less light-headed.  Your skin tone and color start to look more normal. Follow these instructions at home:  Take over-the-counter and prescription medicines only  as told by your health care provider.  Do not take sodium tablets. Doing this can lead to having too much sodium in your body (hypernatremia). Contact a health care provider if:  You continue to have symptoms of mild or moderate dehydration, such as: ? Thirst. ? Dry lips. ? Slightly dry mouth. ? Dizziness. ? Dark urine or less urine than normal. ? Muscle cramps.  You continue to vomit or have diarrhea. Get help right away if you:  Have symptoms of dehydration that get worse.  Have a fever.  Have a severe headache.  Have been vomiting and the following happens: ? Your vomiting gets worse or does not go away. ? Your vomit includes blood or green matter (bile). ? You cannot eat or drink without vomiting.  Have problems with urination or bowel movements, such as: ? Diarrhea that gets worse or does not go away. ? Blood in your stool (feces). This may cause stool to look black and tarry. ? Not urinating, or urinating only a small amount of very dark urine, within 6-8 hours.  Have trouble breathing.  Have symptoms that get worse with treatment. These symptoms may represent a serious problem that is an emergency. Do not wait to see if the symptoms will go away. Get medical help right away. Call your local emergency services (911 in the [place] Do not drive yourself to the hospital. Summary  Rehydration is the replacement of body fluids and minerals (electrolytes) that are lost during dehydration.  Follow instructions from your health care provider for rehydration. The kind of fluid and amount you should drink depend on your condition.  Slowly increase how much you drink until you have taken the amount recommended by your health care provider.  Contact your health care provider if you continue to show signs of mild or moderate dehydration. This information is not intended to replace advice given to you by your health care provider. Make sure you discuss any questions you have with  your health care provider. Document Revised: 04/12/2019 Document Reviewed: 02/20/2019 Elsevier Patient Education  2021 [employer]  

## 2020-06-20 ENCOUNTER — Other Ambulatory Visit: Payer: Self-pay | Admitting: Oncology

## 2020-06-20 ENCOUNTER — Encounter: Payer: Self-pay | Admitting: Medical

## 2020-06-20 MED ORDER — TRIAMCINOLONE ACETONIDE 0.1 % EX LOTN: 1.0000 "application " | TOPICAL_LOTION | Freq: Three times a day (TID) | CUTANEOUS | 1 refills | Status: DC
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
## 2020-06-24 ENCOUNTER — Other Ambulatory Visit: Payer: Self-pay | Admitting: Oncology

## 2020-07-01 ENCOUNTER — Other Ambulatory Visit: Payer: Self-pay | Admitting: Oncology

## 2020-07-01 MED FILL — Dexamethasone Sodium Phosphate Inj 100 MG/10ML: INTRAMUSCULAR | Qty: 1 | Status: AC
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
## 2020-07-02 ENCOUNTER — Inpatient Hospital Stay (HOSPITAL_BASED_OUTPATIENT_CLINIC_OR_DEPARTMENT_OTHER): Payer: BC Managed Care – PPO | Admitting: Oncology

## 2020-07-02 ENCOUNTER — Inpatient Hospital Stay: Payer: BC Managed Care – PPO | Attending: Oncology

## 2020-07-02 ENCOUNTER — Encounter: Payer: Self-pay | Admitting: *Deleted

## 2020-07-02 ENCOUNTER — Other Ambulatory Visit: Payer: Self-pay

## 2020-07-02 ENCOUNTER — Other Ambulatory Visit: Payer: BC Managed Care – PPO

## 2020-07-02 ENCOUNTER — Inpatient Hospital Stay: Payer: BC Managed Care – PPO

## 2020-07-02 VITALS — BP 129/78 | HR 90 | Temp 97.0°F | Resp 18 | Ht 64.0 in | Wt 163.6 lb

## 2020-07-02 DIAGNOSIS — Z171 Estrogen receptor negative status [ER-]: Secondary | ICD-10-CM | POA: Insufficient documentation

## 2020-07-02 DIAGNOSIS — Z79899 Other long term (current) drug therapy: Secondary | ICD-10-CM | POA: Diagnosis not present

## 2020-07-02 DIAGNOSIS — C773 Secondary and unspecified malignant neoplasm of axilla and upper limb lymph nodes: Secondary | ICD-10-CM | POA: Diagnosis not present

## 2020-07-02 DIAGNOSIS — Z5111 Encounter for antineoplastic chemotherapy: Secondary | ICD-10-CM | POA: Diagnosis not present

## 2020-07-02 DIAGNOSIS — C50412 Malignant neoplasm of upper-outer quadrant of left female breast: Secondary | ICD-10-CM | POA: Diagnosis not present

## 2020-07-02 DIAGNOSIS — Z95828 Presence of other vascular implants and grafts: Secondary | ICD-10-CM

## 2020-07-02 DIAGNOSIS — Z801 Family history of malignant neoplasm of trachea, bronchus and lung: Secondary | ICD-10-CM | POA: Insufficient documentation

## 2020-07-02 DIAGNOSIS — Z8051 Family history of malignant neoplasm of kidney: Secondary | ICD-10-CM | POA: Diagnosis not present

## 2020-07-02 LAB — COMPREHENSIVE METABOLIC PANEL
ALT: 13 U/L (ref 0–44)
AST: 17 U/L (ref 15–41)
Albumin: 3.3 g/dL — ABNORMAL LOW (ref 3.5–5.0)
Alkaline Phosphatase: 66 U/L (ref 38–126)
Anion gap: 9 (ref 5–15)
BUN: 16 mg/dL (ref 6–20)
CO2: 21 mmol/L — ABNORMAL LOW (ref 22–32)
Calcium: 8.6 mg/dL — ABNORMAL LOW (ref 8.9–10.3)
Chloride: 109 mmol/L (ref 98–111)
Creatinine, Ser: 0.64 mg/dL (ref 0.44–1.00)
GFR, Estimated: >60 mL/min (ref >60)
Glucose, Bld: 121 mg/dL — ABNORMAL HIGH (ref 70–99)
Potassium: 4 mmol/L (ref 3.5–5.1)
Sodium: 139 mmol/L (ref 135–145)
Total Bilirubin: 0.3 mg/dL (ref 0.3–1.2)
Total Protein: 6 g/dL — ABNORMAL LOW (ref 6.5–8.1)

## 2020-07-02 LAB — CBC WITH DIFFERENTIAL/PLATELET
Abs Immature Granulocytes: 0.02 10*3/uL (ref 0.00–0.07)
Basophils Absolute: 0 10*3/uL (ref 0.0–0.1)
Basophils Relative: 0 %
Eosinophils Absolute: 0 10*3/uL (ref 0.0–0.5)
Eosinophils Relative: 0 %
HCT: 32.4 % — ABNORMAL LOW (ref 36.0–46.0)
Hemoglobin: 10.3 g/dL — ABNORMAL LOW (ref 12.0–15.0)
Immature Granulocytes: 0 %
Lymphocytes Relative: 12 %
Lymphs Abs: 0.8 10*3/uL (ref 0.7–4.0)
MCH: 31.7 pg (ref 26.0–34.0)
MCHC: 31.8 g/dL (ref 30.0–36.0)
MCV: 99.7 fL (ref 80.0–100.0)
Monocytes Absolute: 0.4 10*3/uL (ref 0.1–1.0)
Monocytes Relative: 6 %
Neutro Abs: 5.5 10*3/uL (ref 1.7–7.7)
Neutrophils Relative %: 82 %
Platelets: 210 10*3/uL (ref 150–400)
RBC: 3.25 MIL/uL — ABNORMAL LOW (ref 3.87–5.11)
RDW: 16.6 % — ABNORMAL HIGH (ref 11.5–15.5)
WBC: 6.8 10*3/uL (ref 4.0–10.5)
nRBC: 0 % (ref 0.0–0.2)

## 2020-07-02 MED ORDER — PALONOSETRON HCL INJECTION 0.25 MG/5ML
INTRAVENOUS | Status: AC
  Filled 2020-07-02: qty 5

## 2020-07-02 MED ORDER — SODIUM CHLORIDE 0.9% FLUSH
10.0000 mL | INTRAVENOUS | Status: DC | PRN
  Administered 2020-07-02: 10 mL
  Filled 2020-07-02: qty 10

## 2020-07-02 MED ORDER — SODIUM CHLORIDE 0.9% FLUSH
10.0000 mL | INTRAVENOUS | Status: DC | PRN
Start: 2020-07-02 — End: 2020-07-02
  Administered 2020-07-02: 10 mL via INTRAVENOUS
  Filled 2020-07-02: qty 10

## 2020-07-02 MED ORDER — PALONOSETRON HCL INJECTION 0.25 MG/5ML
0.2500 mg | Freq: Once | INTRAVENOUS | Status: AC
  Administered 2020-07-02: 0.25 mg via INTRAVENOUS

## 2020-07-02 MED ORDER — FLUOROURACIL CHEMO INJECTION 2.5 GM/50ML
600.0000 mg/m2 | Freq: Once | INTRAVENOUS | Status: AC
  Administered 2020-07-02: 1100 mg via INTRAVENOUS
  Filled 2020-07-02: qty 22

## 2020-07-02 MED ORDER — SODIUM CHLORIDE 0.9 % IV SOLN
Freq: Once | INTRAVENOUS | Status: AC
  Filled 2020-07-02: qty 250

## 2020-07-02 MED ORDER — HEPARIN SOD (PORK) LOCK FLUSH 100 UNIT/ML IV SOLN
500.0000 [IU] | Freq: Once | INTRAVENOUS | Status: AC | PRN
  Administered 2020-07-02: 500 [IU]
  Filled 2020-07-02: qty 5

## 2020-07-02 MED ORDER — SODIUM CHLORIDE 0.9 % IV SOLN
600.0000 mg/m2 | Freq: Once | INTRAVENOUS | Status: AC
  Administered 2020-07-02: 1100 mg via INTRAVENOUS
  Filled 2020-07-02: qty 55

## 2020-07-02 MED ORDER — METHOTREXATE SODIUM (PF) CHEMO INJECTION 250 MG/10ML
41.2000 mg/m2 | Freq: Once | INTRAMUSCULAR | Status: AC
  Administered 2020-07-02: 75 mg via INTRAVENOUS
  Filled 2020-07-02: qty 3

## 2020-07-02 MED ORDER — SODIUM CHLORIDE 0.9 % IV SOLN
10.0000 mg | Freq: Once | INTRAVENOUS | Status: AC
  Administered 2020-07-02: 10 mg via INTRAVENOUS
  Filled 2020-07-02: qty 10

## 2020-07-02 NOTE — Patient Instructions (Signed)
[facility]  Discharge Instructions: Thank you for choosing [facility] to provide your oncology and hematology care.   If you have a lab appointment with the [facility], please go directly to the [facility] and check in at the registration area.   Wear comfortable clothing and clothing appropriate for easy access to any Portacath or PICC line.   We strive to give you quality time with your provider. You may need to reschedule your appointment if you arrive late (15 or more minutes).  Arriving late affects you and other patients whose appointments are after yours.  Also, if you miss three or more appointments without notifying the office, you may be dismissed from the clinic at the provider's discretion.      For prescription refill requests, have your pharmacy contact our office and allow 72 hours for refills to be completed.    Today you received the following chemotherapy and/or immunotherapy agents: Cytoxan, Methotrexate, 5FU      To help prevent nausea and vomiting after your treatment, we encourage you to take your nausea medication as directed.  BELOW ARE SYMPTOMS THAT SHOULD BE REPORTED IMMEDIATELY: . *FEVER GREATER THAN 100.4 F (38 C) OR HIGHER . *CHILLS OR SWEATING . *NAUSEA AND VOMITING THAT IS NOT CONTROLLED WITH YOUR NAUSEA MEDICATION . *UNUSUAL SHORTNESS OF BREATH . *UNUSUAL BRUISING OR BLEEDING . *URINARY PROBLEMS (pain or burning when urinating, or frequent urination) . *BOWEL PROBLEMS (unusual diarrhea, constipation, pain near the anus) . TENDERNESS IN MOUTH AND THROAT WITH OR WITHOUT PRESENCE OF ULCERS (sore throat, sores in mouth, or a toothache) . UNUSUAL RASH, SWELLING OR PAIN  . UNUSUAL VAGINAL DISCHARGE OR ITCHING   Items with * indicate a potential emergency and should be followed up as soon as possible or go to the Emergency Department if any problems should occur.  Please show the CHEMOTHERAPY ALERT CARD or  IMMUNOTHERAPY ALERT CARD at check-in to the Emergency Department and triage nurse.  Should you have questions after your visit or need to cancel or reschedule your appointment, please contact [facility]  Dept: [phone]  and follow the prompts.  Office hours are 8:00 a.m. to 4:30 p.m. Monday - Friday. Please note that voicemails left after 4:00 p.m. may not be returned until the following business day.  We are closed weekends and major holidays. You have access to a nurse at all times for urgent questions. Please call the main number to the clinic Dept: [phone] and follow the prompts.   For any non-urgent questions, you may also contact your provider using MyChart. We now offer e-Visits for anyone 63 and older to request care online for non-urgent symptoms. For details visit [email or web address].   Also download the MyChart app! Go to the app store, search "MyChart", open the app, select [facility], and log in with your MyChart username and password.  Due to Covid, a mask is required upon entering the hospital/clinic. If you do not have a mask, one will be given to you upon arrival. For doctor visits, patients may have 1 support person aged 72 or older with them. For treatment visits, patients cannot have anyone with them due to current Covid guidelines and our immunocompromised population.   Methotrexate injection What is this medicine? METHOTREXATE (METH oh TREX ate) is a chemotherapy medicine. It treats certain types of cancer. Some of the cancers treated are breast cancer, head and neck cancer, leukemia, lymphoma, and osteosarcoma.  This medicine can also be used to treat psoriasis and certain kinds of arthritis. This medicine may be used for other purposes; ask your health care provider or pharmacist if you have questions. What should I tell my health care provider before I take this medicine? They need to know if you have any of these  conditions:  fluid in the stomach area or lungs  if you often drink alcohol  infection or immune system problems  kidney disease  liver disease  low blood counts (white cells, platelets, or red blood cells)  lung disease  recent or ongoing radiation  recent or upcoming vaccine  stomach ulcers  ulcerative colitis  an unusual or allergic reaction to methotrexate, other medicines, foods, dyes, or preservatives  pregnant or trying to get pregnant  breast-feeding How should I use this medicine? This medicine is for infusion into a vein or for injection into muscle or into the spinal fluid (whichever applies). It is usually given by a health care professional in a hospital or clinic setting. In rare cases, you might get this medicine at home. You will be taught how to give this medicine. Use exactly as directed. Take your medicine at regular intervals. Do not take your medicine more often than directed. If this medicine is used for arthritis or psoriasis, it should be taken weekly, NOT daily. It is important that you put your used needles and syringes in a special sharps container. Do not put them in a trash can. If you do not have a sharps container, call your pharmacist or healthcare provider to get one. Talk to your pediatrician regarding the use of this medicine in children. While this drug may be prescribed for children as young as 2 years for selected conditions, precautions do apply. Overdosage: If you think you have taken too much of this medicine contact a poison control center or emergency room at once. NOTE: This medicine is only for you. Do not share this medicine with others. What if I miss a dose? It is important not to miss your dose. Call your doctor or health care professional if you are unable to keep an appointment. If you give yourself the medicine and you miss a dose, talk with your doctor or health care professional. Do not take double or extra doses. What may  interact with this medicine? Do not take this medicine with any of the following medications:  acitretin This medicine may also interact with the following medications:  aspirin or aspirin-like medicines including salicylates  azathioprine  certain antibiotics like chloramphenicol, penicillin, tetracycline  certain medicines that treat or prevent blood clots like warfarin, apixaban, dabigatran, and rivaroxaban  certain medicines for stomach problems like esomeprazole, omeprazole, pantoprazole  cyclosporine  dapsone  diuretics  folic acid  gold  hydroxychloroquine  live virus vaccines  medicines for infection like acyclovir, adefovir, amphotericin B, bacitracin, cidofovir, foscarnet, ganciclovir, gentamicin, pentamidine, vancomycin  mercaptopurine  NSAIDs, medicines for pain and inflammation, like ibuprofen or naproxen  pamidronate  pemetrexed  penicillamine  phenylbutazone  phenytoin  probenacid  pyrimethamine  retinoids such as isotretinoin and tretinoin  steroid medicines like prednisone or cortisone  sulfonamides like sulfasalazine and trimethoprim/sulfamethoxazole  theophylline  zoledronic acid This list may not describe all possible interactions. Give your health care provider a list of all the medicines, herbs, non-prescription drugs, or dietary supplements you use. Also tell them if you smoke, drink alcohol, or use illegal drugs. Some items may interact with your medicine. What should I watch  for while using this medicine? This medicine may make you feel generally unwell. This is not uncommon as chemotherapy can affect healthy cells as well as cancer cells. Report any side effects. Continue your course of treatment even though you feel ill unless your health care provider tells you to stop. Your condition will be monitored carefully while you are receiving this medicine. Avoid alcoholic drinks. This medicine can cause serious side effects. To  reduce the risk, your health care provider may give you other medicines to take before receiving this one. Be sure to follow the directions from your health care provider. This medicine can make you more sensitive to the sun. Keep out of the sun. If you cannot avoid being in the sun, wear protective clothing and use sunscreen. Do not use sun lamps or tanning beds/booths. You may get drowsy or dizzy. Do not drive, use machinery, or do anything that needs mental alertness until you know how this medicine affects you. Do not stand or sit up quickly, especially if you are an older patient. This reduces the risk of dizzy or fainting spells. You may need blood work while you are taking this medicine. Call your doctor or health care professional for advice if you get a fever, chills or sore throat, or other symptoms of a cold or flu. Do not treat yourself. This drug decreases your body's ability to fight infections. Try to avoid being around people who are sick. This medicine may increase your risk to bruise or bleed. Call your doctor or health care professional if you notice any unusual bleeding. Be careful brushing or flossing your teeth or using a toothpick because you may get an infection or bleed more easily. If you have any dental work done, tell your dentist you are receiving this medicine Check with your doctor or health care professional if you get an attack of severe diarrhea, nausea and vomiting, or if you sweat a lot. The loss of too much body fluid can make it dangerous for you to take this medicine. Talk to your doctor about your risk of cancer. You may be more at risk for certain types of cancers if you take this medicine. Do not become pregnant while taking this medicine or for 6 months after stopping it. Women should inform their health care provider if they wish to become pregnant or think they might be pregnant. Men should not father a child while taking this medicine and for 3 months after  stopping it. There is potential for serious harm to an unborn child. Talk to your health care provider for more information. Do not breast-feed an infant while taking this medicine or for 1 week after stopping it. This medicine may make it more difficult to get pregnant or father a child. Talk to your health care provider if you are concerned about your fertility. What side effects may I notice from receiving this medicine? Side effects that you should report to your doctor or health care professional as soon as possible:  allergic reactions like skin rash, itching or hives, swelling of the face, lips, or tongue  back pain  breathing problems or shortness of breath  confusion  diarrhea  dry, nonproductive cough  low blood counts - this medicine may decrease the number of white blood cells, red blood cells and platelets. You may be at increased risk of infections and bleeding  mouth sores  redness, blistering, peeling or loosening of the skin, including inside the mouth  seizures  severe  headaches  signs of infection - fever or chills, cough, sore throat, pain or difficulty passing urine  signs and symptoms of bleeding such as bloody or black, tarry stools; red or dark-brown urine; spitting up blood or brown material that looks like coffee grounds; red spots on the skin; unusual bruising or bleeding from the eye, gums, or nose  signs and symptoms of kidney injury like trouble passing urine or change in the amount of urine  signs and symptoms of liver injury like dark yellow or brown urine; general ill feeling or flu-like symptoms; light-colored stools; loss of appetite; nausea; right upper belly pain; unusually weak or tired; yellowing of the eyes or skin  stiff neck  vomiting Side effects that usually do not require medical attention (report to your doctor or health care professional if they continue or are bothersome):  dizziness  hair loss  headache  stomach  pain  upset stomach This list may not describe all possible side effects. Call your doctor for medical advice about side effects. You may report side effects to FDA at 1-800-FDA-1088. Where should I keep my medicine? This medicine is given in a hospital or clinic. It will not be stored at home. NOTE: This sheet is a summary. It may not cover all possible information. If you have questions about this medicine, talk to your doctor, pharmacist, or health care provider.  2021 Elsevier/Gold Standard (2019-06-27 10:19:36)  Fluorouracil, 5FU; Diclofenac topical cream What is this medicine? FLUOROURACIL; DICLOFENAC (flure oh YOOR a sil; dye KLOE fen ak) is a combination of a topical chemotherapy agent and non-steroidal anti-inflammatory drug (NSAID). It is used on the skin to treat skin cancer and skin conditions that could become cancer. This medicine may be used for other purposes; ask your health care provider or pharmacist if you have questions. COMMON BRAND NAME(S): FLUORAC What should I tell my health care provider before I take this medicine? They need to know if you have any of these conditions:  bleeding problems  cigarette smoker  DPD enzyme deficiency  heart disease  high blood pressure  if you frequently drink alcohol containing drinks  kidney disease  liver disease  open or infected skin  stomach problems  swelling or open sores at the treatment site  recent or planned coronary artery bypass graft (CABG) surgery  an unusual or allergic reaction to fluorouracil, diclofenac, aspirin, other NSAIDs, other medicines, foods, dyes, or preservatives  pregnant or trying to get pregnant  breast-feeding How should I use this medicine? This medicine is only for use on the skin. Follow the directions on the prescription label. Wash hands before and after use. Wash affected area and gently pat dry. To apply this medicine use a cotton-tipped applicator, or use gloves if applying  with fingertips. If applied with unprotected fingertips, it is very important to wash your hands well after you apply this medicine. Avoid applying to the eyes, nose, or mouth. Apply enough medicine to cover the affected area. You can cover the area with a light gauze dressing, but do not use tight or air-tight dressings. Finish the full course prescribed by your doctor or health care professional, even if you think your condition is better. Do not stop taking except on the advice of your doctor or health care professional. Talk to your pediatrician regarding the use of this medicine in children. Special care may be needed. Overdosage: If you think you have taken too much of this medicine contact a poison control center or  emergency room at once. NOTE: This medicine is only for you. Do not share this medicine with others. What if I miss a dose? If you miss a dose, apply it as soon as you can. If it is almost time for your next dose, only use that dose. Do not apply extra doses. Contact your doctor or health care professional if you miss more than one dose. What may interact with this medicine? Interactions are not expected. Do not use any other skin products without telling your doctor or health care professional. This list may not describe all possible interactions. Give your health care provider a list of all the medicines, herbs, non-prescription drugs, or dietary supplements you use. Also tell them if you smoke, drink alcohol, or use illegal drugs. Some items may interact with your medicine. What should I watch for while using this medicine? Visit your doctor or healthcare provider for checks on your progress. You will need to use this medicine for 2 to 6 weeks. This may be longer depending on the condition being treated. You may not see full healing for another 1 to 2 months after you stop using the medicine. This medicine may cause serious skin reactions. They can happen weeks to months after  starting the medicine. Contact your healthcare provider right away if you notice fevers or flu-like symptoms with a rash. The rash may be red or purple and then turn into blisters or peeling of the skin. Or, you might notice a red rash with swelling of the face, lips or lymph nodes in your neck or under your arms. Treated areas of skin can look unsightly during and for several weeks after treatment with this medicine. This medicine can make you more sensitive to the sun. Keep out of the sun. If you cannot avoid being in the sun, wear protective clothing and use sunscreen. Do not use sun lamps or tanning beds/booths. Serious side effects or death can occur if a pet comes into contact with this drug. Contact a vet right away if a pet touches or licks the drug on your skin or comes into contact with the container. Throw away or wash any items used to apply this drug. Wash your hands after applying the drug. Make sure the drug does not get on clothing, carpet, or furniture. If you cannot avoid skin to skin contact with your pet, ask your health care provider if you can cover the area(s) where you apply this drug. Do not become pregnant while taking this medicine. Women should inform their doctor if they wish to become pregnant or think they might be pregnant. There is a potential for serious side effects to an unborn child. Talk to your healthcare provider or pharmacist for more information. What side effects may I notice from receiving this medicine? Side effects that you should report to your doctor or health care professional as soon as possible:  allergic reactions like skin rash, itching or hives, swelling of the face, lips, or tongue  black or bloody stools, blood in the urine or vomit  blurred vision  chest pain  difficulty breathing or wheezing  rash, fever, and swollen lymph nodes  redness, blistering, peeling or loosening of the skin, including inside the mouth  severe redness and  swelling of normal skin  slurred speech or weakness on one side of the body  trouble passing urine or change in the amount of urine  unexplained weight gain or swelling  unusually weak or tired  yellowing of  eyes or skin Side effects that usually do not require medical attention (report to your doctor or health care professional if they continue or are bothersome):  increased sensitivity of the skin to sun and ultraviolet light  pain and burning of the affected area  scaling or swelling of the affected area  skin rash, itching of the affected area  tenderness This list may not describe all possible side effects. Call your doctor for medical advice about side effects. You may report side effects to FDA at 1-800-FDA-1088. Where should I keep my medicine? Keep out of the reach of children and pets. Store at room temperature between 20 and 25 degrees C (68 and 77 degrees F). Throw away any unused medicine after the expiration date. NOTE: This sheet is a summary. It may not cover all possible information. If you have questions about this medicine, talk to your doctor, pharmacist, or health care provider.  2021 Elsevier/Gold Standard (2019-01-18 14:47:16)

## 2020-07-04 ENCOUNTER — Inpatient Hospital Stay: Payer: BC Managed Care – PPO

## 2020-07-05 ENCOUNTER — Ambulatory Visit: Payer: Self-pay | Admitting: Surgery

## 2020-07-05 DIAGNOSIS — C50912 Malignant neoplasm of unspecified site of left female breast: Secondary | ICD-10-CM | POA: Diagnosis not present

## 2020-07-05 DIAGNOSIS — Z171 Estrogen receptor negative status [ER-]: Secondary | ICD-10-CM

## 2020-07-05 DIAGNOSIS — Z9189 Other specified personal risk factors, not elsewhere classified: Secondary | ICD-10-CM | POA: Diagnosis not present

## 2020-07-05 NOTE — H&P (Signed)
Kathryn [name] Appointment: 07/05/2020 11:50 AM Location: [facility] Patient #: [id number] DOB: 06/12/1960 Married / Language: [name] / Race: White Female  History of Present Illness [name] A. Vick Filter MD; 07/05/2020 1:15 PM) Patient words: Patient returns for follow-up of her recurrent left breast cancer after additional chemotherapy. She's had a good response and is ready for surgery. She has opted for bilateral mastectomy. Long discussion with pros and cons of this. There is no survival advantage which was expressed by her oncologist which I agree with. We discussed the semi-and long-term expectations and other potential issues, as well as increased complication rate with bilateral mastectomies. She does have introduced reconstruction and I will refer her to see plastic surgery. Overall, she is lost her hair from chemotherapy but is done remarkably well.  The patient is a 60 year old female.   Problem List/Past Medical Sharyn Lull R. Brooks, CMA; 07/05/2020 11:58 AM) POST-OPERATIVE STATE (Z98.890) PORT-A-CATH IN PLACE [phone]) RECURRENT BREAST CANCER, LEFT (E99.371)  Past Surgical History Sharyn Lull R. Brooks, CMA; 07/05/2020 11:58 AM) Breast Biopsy Left. Cesarean Section - Multiple  Diagnostic Studies History Sharyn Lull R. Rolena Infante, CMA; 07/05/2020 11:58 AM) Colonoscopy never Mammogram within last year Pap Smear 1-5 years ago  Allergies Sharyn Lull R. Brooks, CMA; 07/05/2020 11:58 AM) No Known Allergies [04/11/2018]: No Known Drug Allergies [06/09/2019]:  Medication History Sharyn Lull R. Brooks, CMA; 07/05/2020 11:58 AM) Cyclobenzaprine HCl (10MG  Tablet, Oral) Active. Ondansetron HCl (4MG  Tablet, Oral) Active. Claritin (Oral) Specific strength unknown - Active. Capecitabine (500MG  Tablet, Oral) Active. Colace (100MG  Capsule, Oral) Active. Medications Reconciled  Social History Sharyn Lull R. Brooks, CMA; 07/05/2020 11:58 AM) Alcohol use Occasional alcohol  use. Caffeine use Carbonated beverages, Coffee, Tea. No drug use Tobacco use Former smoker.  Family History Sharyn Lull R. Rolena Infante, CMA; 07/05/2020 11:58 AM) Alcohol Abuse Father. Arthritis Mother. Breast Cancer Sister. Diabetes Mellitus Father. Heart Disease Father. Kidney Disease Sister. Melanoma Sister. Respiratory Condition Father.  Pregnancy / Birth History Sharyn Lull R. Rolena Infante, CMA; 07/05/2020 11:58 AM) Age at menarche 17 years. Age of menopause 51-55 Contraceptive History Oral contraceptives. Gravida 3 Irregular periods Maternal age 41-35  Other Problems Sharyn Lull R. Brooks, CMA; 07/05/2020 11:58 AM) Anxiety Disorder Chest pain Migraine Headache     Physical Exam (Tareka Jhaveri A. Areliz Rothman MD; 07/05/2020 1:16 PM)  General Mental Status-Alert. General Appearance-Consistent with stated age. Hydration-Well hydrated. Voice-Normal.  Breast Note: not examined today due to desire for bilateral mastectomy  Neurologic Neurologic evaluation reveals -alert and oriented x 3 with no impairment of recent or remote memory. Mental Status-Normal.  Lymphatic Head & Neck  General Head & Neck Lymphatics: Bilateral - Description - Normal. Axillary  General Axillary Region: Bilateral - Description - Normal. Tenderness - Non Tender.    Assessment & Plan (Sohum Delillo A. Janesha Brissette MD; 07/05/2020 1:18 PM)  RECURRENT BREAST CANCER, LEFT (C50.912) Impression: On discussion about treatment options. She will need a left mastectomy with attempted sentinel lymph node mapping. Explained failure rates can be up to 25% given previous radiation surgery. She understands available or completion node dissection on the left. We long discussion about risk reducing right mastectomy. She has significant concerns about recurrence and/or new breast cancer. She is relatively young as well. I explained that there is no survival advantage to doing so complication rates double. After long  discussion, I think her peace of mind would be better with risk reducing mastectomy which she agreed with. Refer to plastic surgery for opinion about reconstruction options with her either now or delayed  Discussed treatment  options for breast cancer to include breast conservation vs mastectomy with reconstruction. Pt has decided on mastectomy. Risk include bleeding, infection, flap necrosis, pain, numbness, recurrence, hematoma, other surgery needs. Pt understands and agrees to proceed.   Risk of sentinel lymph node mapping include bleeding, infection, lymphedema, shoulder pain. stiffness, dye allergy. cosmetic deformity , blood clots, death, need for more surgery. Pt agres to proceed.   AT HIGH RISK FOR BREAST CANCER (Z91.89) Impression: see above

## 2020-07-05 NOTE — H&P (View-Only) (Signed)
Kathryn [name] Appointment: 07/05/2020 11:50 AM Location: [facility] Patient #: [id number] DOB: 06/12/1960 Married / Language: Kathryn [name] / Race: White Female  History of Present Illness Kathryn Moores A. Nicolae Vasek MD; 07/05/2020 1:15 PM) Patient words: Patient returns for follow-up of her recurrent left breast cancer after additional chemotherapy. She's had a good response and is ready for surgery. She has opted for bilateral mastectomy. Long discussion with pros and cons of this. There is no survival advantage which was expressed by her oncologist which I agree with. We discussed the semi-and long-term expectations and other potential issues, as well as increased complication rate with bilateral mastectomies. She does have introduced reconstruction and I will refer her to see plastic surgery. Overall, she is lost her hair from chemotherapy but is done remarkably well.  The patient is a 60 year old female.   Problem List/Past Medical Kathryn [name], CMA; 07/05/2020 11:58 AM) POST-OPERATIVE STATE (Z98.890) PORT-A-CATH IN PLACE [phone]) RECURRENT BREAST CANCER, LEFT (E99.371)  Past Surgical History Kathryn [name], CMA; 07/05/2020 11:58 AM) Breast Biopsy Left. Cesarean Section - Multiple  Diagnostic Studies History Kathryn [name], CMA; 07/05/2020 11:58 AM) Colonoscopy never Mammogram within last year Pap Smear 1-5 years ago  Allergies Kathryn [name], CMA; 07/05/2020 11:58 AM) No Known Allergies [04/11/2018]: No Known Drug Allergies [06/09/2019]:  Medication History Kathryn [name], CMA; 07/05/2020 11:58 AM) Cyclobenzaprine HCl (10MG  Tablet, Oral) Active. Ondansetron HCl (4MG  Tablet, Oral) Active. Claritin (Oral) Specific strength unknown - Active. Capecitabine (500MG  Tablet, Oral) Active. Colace (100MG  Capsule, Oral) Active. Medications Reconciled  Social History Kathryn [name], CMA; 07/05/2020 11:58 AM) Alcohol use Occasional alcohol  use. Caffeine use Carbonated beverages, Coffee, Tea. No drug use Tobacco use Former smoker.  Family History Kathryn [name], CMA; 07/05/2020 11:58 AM) Alcohol Abuse Father. Arthritis Mother. Breast Cancer Sister. Diabetes Mellitus Father. Heart Disease Father. Kidney Disease Sister. Melanoma Sister. Respiratory Condition Father.  Pregnancy / Birth History Kathryn [name], CMA; 07/05/2020 11:58 AM) Age at menarche 17 years. Age of menopause [age] Contraceptive History Oral contraceptives. Gravida 3 Irregular periods Maternal age 41-35  Other Problems Kathryn [name], CMA; 07/05/2020 11:58 AM) Anxiety Disorder Chest pain Migraine Headache     Physical Exam (Kathryn Schum A. Pilar Corrales MD; 07/05/2020 1:16 PM)  General Mental Status-Alert. General Appearance-Consistent with stated age. Hydration-Well hydrated. Voice-Normal.  Breast Note: not examined today due to desire for bilateral mastectomy  Neurologic Neurologic evaluation reveals -alert and oriented x 3 with no impairment of recent or remote memory. Mental Status-Normal.  Lymphatic Head & Neck  General Head & Neck Lymphatics: Bilateral - Description - Normal. Axillary  General Axillary Region: Bilateral - Description - Normal. Tenderness - Non Tender.    Assessment & Plan (Kathryn Massi A. Marybell Robards MD; 07/05/2020 1:18 PM)  RECURRENT BREAST CANCER, LEFT (C50.912) Impression: On discussion about treatment options. She will need a left mastectomy with attempted sentinel lymph node mapping. Explained failure rates can be up to 25% given previous radiation surgery. She understands available or completion node dissection on the left. We long discussion about risk reducing right mastectomy. She has significant concerns about recurrence and/or new breast cancer. She is relatively young as well. I explained that there is no survival advantage to doing so complication rates double. After long  discussion, I think her peace of mind would be better with risk reducing mastectomy which she agreed with. Refer to plastic surgery for opinion about reconstruction options with her either now or delayed  Discussed treatment  options for breast cancer to include breast conservation vs mastectomy with reconstruction. Pt has decided on mastectomy. Risk include bleeding, infection, flap necrosis, pain, numbness, recurrence, hematoma, other surgery needs. Pt understands and agrees to proceed.   Risk of sentinel lymph node mapping include bleeding, infection, lymphedema, shoulder pain. stiffness, dye allergy. cosmetic deformity , blood clots, death, need for more surgery. Pt agres to proceed.   AT HIGH RISK FOR BREAST CANCER (Z91.89) Impression: see above

## 2020-07-09 ENCOUNTER — Encounter: Payer: Self-pay | Admitting: *Deleted

## 2020-07-15 ENCOUNTER — Encounter: Payer: Self-pay | Admitting: *Deleted

## 2020-07-18 ENCOUNTER — Ambulatory Visit (INDEPENDENT_AMBULATORY_CARE_PROVIDER_SITE_OTHER): Payer: BC Managed Care – PPO | Admitting: Plastic Surgery

## 2020-07-18 ENCOUNTER — Other Ambulatory Visit: Payer: Self-pay

## 2020-07-18 ENCOUNTER — Encounter: Payer: Self-pay | Admitting: Oncology

## 2020-07-18 ENCOUNTER — Encounter: Payer: Self-pay | Admitting: Plastic Surgery

## 2020-07-18 VITALS — BP 116/78 | HR 107 | Ht 64.0 in | Wt 162.4 lb

## 2020-07-18 DIAGNOSIS — Z171 Estrogen receptor negative status [ER-]: Secondary | ICD-10-CM

## 2020-07-18 DIAGNOSIS — C50412 Malignant neoplasm of upper-outer quadrant of left female breast: Secondary | ICD-10-CM | POA: Diagnosis not present

## 2020-07-18 DIAGNOSIS — Z9011 Acquired absence of right breast and nipple: Secondary | ICD-10-CM | POA: Diagnosis not present

## 2020-07-18 DIAGNOSIS — C50912 Malignant neoplasm of unspecified site of left female breast: Secondary | ICD-10-CM | POA: Diagnosis not present

## 2020-07-18 NOTE — Progress Notes (Signed)
Referring Provider Kandace Blitz, MD [address] [place],  [place] [address]   CC:  Chief Complaint  Patient presents with  . Advice Only      Kathryn [name] is an 60 y.o. female.  HPI: Patient presents to discuss her options for breast reconstruction.  She initially had a breast cancer diagnosis a little over 2 years ago.  At that time she was treated with chemotherapy and breast conservation therapy with what I believe was a complete axillary dissection.  More recently she developed what is believed to be a recurrence in the left breast.  She is planning to undergo bilateral mastectomies.  She is considering whether or not to have reconstruction and wants to know her options.  She is receiving neoadjuvant chemotherapy now.  No Known Allergies  Outpatient Encounter Medications as of 07/18/2020  Medication Sig Note  . acetaminophen (TYLENOL) 500 MG tablet Take 1,000 mg by mouth every 6 (six) hours as needed (for pain.).   Marland Kitchen cholecalciferol (VITAMIN D3) 25 MCG (1000 UNIT) tablet Take 1 tablet (1,000 Units total) by mouth daily.   Marland Kitchen docusate sodium (COLACE) 100 MG capsule Take 2 capsules (200 mg total) by mouth in the morning and at bedtime. Start the morning of chemotherapy and continue for one week   . ibuprofen (ADVIL) 800 MG tablet Take 1 tablet (800 mg total) by mouth every 8 (eight) hours as needed.   . loratadine (CLARITIN) 10 MG tablet Take 1 tablet (10 mg total) by mouth daily. (Patient taking differently: Take 10 mg by mouth daily as needed for allergies.)   . omeprazole (PRILOSEC) 40 MG capsule Take 1 capsule (40 mg total) by mouth daily.   . ondansetron (ZOFRAN) 8 MG tablet Take 1 tablet (8 mg total) by mouth 2 (two) times daily as needed for nausea or vomiting. Start day 4 after chemo.   . [DISCONTINUED] ciprofloxacin (CIPRO) 500 MG tablet Take 1 tablet (500 mg total) by mouth 2 (two) times daily. Start 7 days after chemotherapy and take for 5 days.   .  [DISCONTINUED] fluconazole (DIFLUCAN) 100 MG tablet Take 1 tablet (100 mg total) by mouth daily. Start day of chemo and continue for 6 days   . [DISCONTINUED] prochlorperazine (COMPAZINE) 10 MG tablet Take 1 tablet (10 mg total) by mouth every 6 (six) hours as needed (Nausea or vomiting). 04/18/2020: Hasnt started  . [DISCONTINUED] triamcinolone lotion (KENALOG) 0.1 % Apply 1 application topically 3 (three) times daily.    No facility-administered encounter medications on file as of 07/18/2020.     Past Medical History:  Diagnosis Date  . Breast cancer ([place])    stage 3 - left  . Family history of kidney cancer   . Family history of melanoma   . Headache    hormone related, none since 1st child was born   . Personal history of chemotherapy   . Personal history of radiation therapy   . Pleurisy     Past Surgical History:  Procedure Laterality Date  . AXILLARY LYMPH NODE DISSECTION Left 10/11/2018   Procedure: LEFT AXILLARY LYMPH NODE DISSECTION;  Surgeon: Erroll Luna, MD;  Location: [facility];  Service: General;  Laterality: Left;  . BREAST LUMPECTOMY Left 08/2018  . BREAST LUMPECTOMY WITH RADIOACTIVE SEED AND SENTINEL LYMPH NODE BIOPSY Left 09/23/2018   Procedure: LEFT BREAST LUMPECTOMY WITH RADIOACTIVE SEED AND LEFT AXILLARY TARGETED LYMPH NODE BIOPY AND  LEFT AXILLARY SENTINEL LYMPH NODE MAPPING;  Surgeon: Brantley Stage,  [name], MD;  Location: [facility];  Service: General;  Laterality: Left;  . CESAREAN SECTION     x3  . ORIF HUMERUS FRACTURE Right 01/31/2019   Procedure: OPEN REDUCTION INTERNAL FIXATION (ORIF) Right 3 part proximal humerus fracture;  Surgeon: Justice Britain, MD;  Location: [facility];  Service: Orthopedics;  Laterality: Right;  167min  . PORT-A-CATH REMOVAL Right 07/18/2019   Procedure: REMOVAL PORT-A-CATH;  Surgeon: Erroll Luna, MD;  Location: [facility];  Service: General;  Laterality: Right;  . PORTACATH PLACEMENT N/A 04/28/2018   Procedure:  INSERTION PORT-A-CATH WITH ULTRASOUND;  Surgeon: Erroll Luna, MD;  Location: [facility];  Service: General;  Laterality: N/A;  . PORTACATH PLACEMENT N/A 04/25/2020   Procedure: INSERTION PORT-A-CATH;  Surgeon: Erroll Luna, MD;  Location: [facility];  Service: General;  Laterality: N/A;  76 MIN IN LDOW PLEASE    Family History  Problem Relation Age of Onset  . Kidney cancer Sister 45       d. 32  . Lung cancer Paternal Uncle   . Melanoma Sister   . Melanoma Sister     Social History   Social History Narrative  . Not on file  Denies tobacco use  Review of Systems General: Denies fevers, chills, weight loss CV: Denies chest pain, shortness of breath, palpitations  Physical Exam Vitals with BMI 07/18/2020 07/02/2020 06/19/2020  Height 5\' 4"  5\' 4"  -  Weight 162 lbs 6 oz 163 lbs 10 oz -  BMI 35.32 99.24 -  Systolic 268 341 -  Diastolic 78 78 -  Pulse 962 90 104    General:  No acute distress,  Alert and oriented, Non-Toxic, Normal speech and affect Breast: She has no ptosis.  She is probably around a B cup.  She is fairly symmetric at the moment with perhaps a little bit of constriction of the footprint on the left side.  She does not have bad skin fibrosis from the radiation.  She has a periareolar scar on the left that was used for her lumpectomy.  She has a transverse scar in the axilla on the left from the axillary dissection.  She has a port in the right upper chest.  Assessment/Plan Had a long discussion with Kathryn [name] about her options for reconstruction.  She is not interested in autologous tissue flaps.  She is considering implant-based reconstruction.  I explained she has had a slightly higher risk for complications given her history of radiation on the left side.  I am uncertain whether or not she be a candidate for nipple sparing mastectomy from a cancer standpoint and will defer to Dr. Brantley Stage about this.  She wants to be no bigger than she currently is.  We discussed that the  placement of a tissue expander and what would likely be the prepectoral plane at the time of her mastectomy.  I explained that we would probably not begin to fill for at least a month after surgery to allow the radiated skin time to heal before stressing it.  At some point within the desired size has been achieved or I can stretch the skin no further we will switch it out for an implant.  We did talk about the difference between gel and saline implants and she still thinking about her preference in regards to this.  We discussed potential risk that include bleeding, infection, damage to surrounding structures and need for additional procedures.  We discussed potential for wound healing complications or infection that  might require removal of the expander postoperatively.  We discussed the challenges and stretching the radiated skin with the expander but her skin changes do not appear too bad and I think if we can get it to heal we could get her to a suitable size particularly if the majority of her skin is spared with the mastectomy.  She is going to think about her priorities and get back to [place] about whether or not she is interested in moving forward with reconstruction.  All of her questions were answered.  Cindra Presume 07/18/2020, 5:29 PM

## 2020-07-23 ENCOUNTER — Encounter: Payer: Self-pay | Admitting: Plastic Surgery

## 2020-07-25 ENCOUNTER — Encounter: Payer: Self-pay | Admitting: *Deleted

## 2020-07-25 ENCOUNTER — Other Ambulatory Visit: Payer: BC Managed Care – PPO

## 2020-07-25 ENCOUNTER — Ambulatory Visit: Payer: Self-pay | Admitting: Surgery

## 2020-07-25 ENCOUNTER — Ambulatory Visit: Payer: BC Managed Care – PPO | Admitting: Oncology

## 2020-07-25 NOTE — Pre-Procedure Instructions (Signed)
Surgical Instructions    Your procedure is scheduled on Tuesday June 7th.  Report to [facility] Main Entrance "A" at 12:15 P.M., then check in with the Admitting office.  Call this number if you have problems the morning of surgery:  [phone]   If you have any questions prior to your surgery date call [phone]: Open Monday-Friday 8am-4pm    Remember:  Do not eat after midnight the night before your surgery  You may drink clear liquids until 11:15 AM the morning of your surgery.   Clear liquids allowed are: Water, Non-Citrus Juices (without pulp), Carbonated Beverages, Clear Tea, Black Coffee Only, and Gatorade    Take these medicines the morning of surgery with A SIP OF WATER  loratadine (CLARITIN)       As of today, STOP taking any Aspirin (unless otherwise instructed by your surgeon) Aleve, Naproxen, Ibuprofen, Motrin, Advil, Goody's, BC's, all herbal medications, fish oil, and all vitamins.          Do not wear jewelry or makeup. Do not wear lotions, powders, perfumes, or deodorant. Do not shave 48 hours prior to surgery.  Do not bring valuables to the hospital. DO Not wear nail polish, gel polish, artificial nails, or any other type of covering on natural nails including finger and toenails. If patients have artificial nails, gel coating, etc. that need to be removed by a nail salon please have this removed prior to surgery or surgery may need to be canceled/delayed if the surgeon/ anesthesia feels like the patient is unable to be adequately monitored.             [facility] is not responsible for any belongings or valuables.  Do NOT Smoke (Tobacco/Vaping) or drink Alcohol 24 hours prior to your procedure If you use a CPAP at night, you may bring all equipment for your overnight stay.   Contacts, glasses, dentures or paritals may not be worn into surgery, please bring cases for these belongings   For patients admitted to the hospital, discharge time will be  determined by your treatment team.   Patients discharged the day of surgery will not be allowed to drive home, and someone needs to stay with them for 24 hours.    Special instructions:    Oral Hygiene is also important to reduce your risk of infection.  Remember - BRUSH YOUR TEETH THE MORNING OF SURGERY WITH YOUR REGULAR TOOTHPASTE   - Preparing For Surgery  Before surgery, you can play an important role. Because skin is not sterile, your skin needs to be as free of germs as possible. You can reduce the number of germs on your skin by washing with CHG (chlorahexidine gluconate) Soap before surgery.  CHG is an antiseptic cleaner which kills germs and bonds with the skin to continue killing germs even after washing.     Please do not use if you have an allergy to CHG or antibacterial soaps. If your skin becomes reddened/irritated stop using the CHG.  Do not shave (including legs and underarms) for at least 48 hours prior to first CHG shower. It is OK to shave your face.  Please follow these instructions carefully.    1.  Shower the NIGHT BEFORE SURGERY and the MORNING OF SURGERY with CHG Soap.   If you chose to wash your hair, wash your hair first as usual with your normal shampoo. After you shampoo, rinse your hair and body thoroughly to remove the shampoo.  Then [employer]  and genitals (private parts) with your normal soap and rinse thoroughly to remove soap.  2. After that Use CHG Soap as you would any other liquid soap. You can apply CHG directly to the skin and wash gently with a scrungie or a clean washcloth.   3. Apply the CHG Soap to your body ONLY FROM THE NECK DOWN.  Do not use on open wounds or open sores. Avoid contact with your eyes, ears, mouth and genitals (private parts). Wash Face and genitals (private parts)  with your normal soap.   4. Wash thoroughly, paying special attention to the area where your surgery will be performed.  5. Thoroughly rinse your body  with warm water from the neck down.  6. DO NOT shower/wash with your normal soap after using and rinsing off the CHG Soap.  7. Pat yourself dry with a CLEAN TOWEL.  8. Wear CLEAN PAJAMAS to bed the night before surgery  9. Place CLEAN SHEETS on your bed the night before your surgery  10. DO NOT SLEEP WITH PETS.   Day of Surgery:  Take a shower with CHG soap. Wear Clean/Comfortable clothing the morning of surgery Do not apply any deodorants/lotions.   Remember to brush your teeth WITH YOUR REGULAR TOOTHPASTE.   Please read over the following fact sheets that you were given.

## 2020-07-26 ENCOUNTER — Encounter (HOSPITAL_COMMUNITY): Payer: Self-pay

## 2020-07-26 ENCOUNTER — Encounter (HOSPITAL_COMMUNITY)
Admission: RE | Admit: 2020-07-26 | Discharge: 2020-07-26 | Disposition: A | Discharge status: Home / Self Care | Payer: BC Managed Care – PPO | Source: Ambulatory Visit | Attending: Surgery | Admitting: Surgery

## 2020-07-26 ENCOUNTER — Other Ambulatory Visit: Payer: Self-pay

## 2020-07-26 DIAGNOSIS — Z20822 Contact with and (suspected) exposure to covid-19: Secondary | ICD-10-CM | POA: Diagnosis not present

## 2020-07-26 DIAGNOSIS — Z01812 Encounter for preprocedural laboratory examination: Secondary | ICD-10-CM | POA: Diagnosis not present

## 2020-07-26 DIAGNOSIS — C50922 Malignant neoplasm of unspecified site of left male breast: Secondary | ICD-10-CM

## 2020-07-26 HISTORY — DX: Gastro-esophageal reflux disease without esophagitis: K21.9

## 2020-07-26 HISTORY — DX: Anemia, unspecified: D64.9

## 2020-07-26 LAB — COMPREHENSIVE METABOLIC PANEL
ALT: 19 U/L (ref 0–44)
AST: 22 U/L (ref 15–41)
Albumin: 3.6 g/dL (ref 3.5–5.0)
Alkaline Phosphatase: 62 U/L (ref 38–126)
Anion gap: 4 — ABNORMAL LOW (ref 5–15)
BUN: 10 mg/dL (ref 6–20)
CO2: 27 mmol/L (ref 22–32)
Calcium: 8.9 mg/dL (ref 8.9–10.3)
Chloride: 108 mmol/L (ref 98–111)
Creatinine, Ser: 0.61 mg/dL (ref 0.44–1.00)
GFR, Estimated: >60 mL/min (ref >60)
Glucose, Bld: 90 mg/dL (ref 70–99)
Potassium: 3.9 mmol/L (ref 3.5–5.1)
Sodium: 139 mmol/L (ref 135–145)
Total Bilirubin: 0.6 mg/dL (ref 0.3–1.2)
Total Protein: 6.2 g/dL — ABNORMAL LOW (ref 6.5–8.1)

## 2020-07-26 LAB — CBC WITH DIFFERENTIAL/PLATELET
Abs Immature Granulocytes: 0.01 10*3/uL (ref 0.00–0.07)
Basophils Absolute: 0.1 10*3/uL (ref 0.0–0.1)
Basophils Relative: 1 %
Eosinophils Absolute: 0.1 10*3/uL (ref 0.0–0.5)
Eosinophils Relative: 2 %
HCT: 37.5 % (ref 36.0–46.0)
Hemoglobin: 11.7 g/dL — ABNORMAL LOW (ref 12.0–15.0)
Immature Granulocytes: 0 %
Lymphocytes Relative: 26 %
Lymphs Abs: 1 10*3/uL (ref 0.7–4.0)
MCH: 31.8 pg (ref 26.0–34.0)
MCHC: 31.2 g/dL (ref 30.0–36.0)
MCV: 101.9 fL — ABNORMAL HIGH (ref 80.0–100.0)
Monocytes Absolute: 0.5 10*3/uL (ref 0.1–1.0)
Monocytes Relative: 12 %
Neutro Abs: 2.2 10*3/uL (ref 1.7–7.7)
Neutrophils Relative %: 59 %
Platelets: 165 10*3/uL (ref 150–400)
RBC: 3.68 MIL/uL — ABNORMAL LOW (ref 3.87–5.11)
RDW: 15.9 % — ABNORMAL HIGH (ref 11.5–15.5)
WBC: 3.8 10*3/uL — ABNORMAL LOW (ref 4.0–10.5)
nRBC: 0 % (ref 0.0–0.2)

## 2020-07-26 LAB — SARS CORONAVIRUS 2 (TAT 6-24 HRS): SARS Coronavirus 2: NEGATIVE

## 2020-07-26 NOTE — Progress Notes (Signed)
PCP - Kandace Blitz, MD Cardiologist - Denies Oncologist- Lurline Del, MD  PPM/ICD - Denies  Chest x-ray - N/A EKG - N/A Stress Test - 05/27/2015 (Care Everywhere) ECHO - 04/25/18 Cardiac Cath - Denies  Sleep Study - Denies  Patient denies being diabetic.  Blood Thinner Instructions: N/A Aspirin Instructions: N/A  ERAS Protcol - Yes PRE-SURGERY Ensure or G2- Not ordered  COVID TEST- 07/26/20   Anesthesia review: No  Patient denies shortness of breath, fever, cough and chest pain at PAT appointment   All instructions explained to the patient, with a verbal understanding of the material. Patient agrees to go over the instructions while at home for a better understanding. Patient also instructed to self quarantine after being tested for COVID-19. The opportunity to ask questions was provided.

## 2020-07-29 NOTE — Progress Notes (Signed)
Pt made aware of surgery time change for 07/30/20 to 1:15pm-3:45pm, arrival time of 11:15am.

## 2020-07-30 ENCOUNTER — Encounter (HOSPITAL_COMMUNITY): Payer: Self-pay | Admitting: Surgery

## 2020-07-30 ENCOUNTER — Other Ambulatory Visit: Payer: Self-pay

## 2020-07-30 ENCOUNTER — Ambulatory Visit (HOSPITAL_COMMUNITY): Payer: BC Managed Care – PPO | Admitting: Certified Registered"

## 2020-07-30 ENCOUNTER — Ambulatory Visit (HOSPITAL_COMMUNITY)
Admission: RE | Admit: 2020-07-30 | Discharge: 2020-07-30 | Disposition: A | Discharge status: Home / Self Care | Payer: BC Managed Care – PPO | Source: Ambulatory Visit | Attending: Surgery | Admitting: Surgery

## 2020-07-30 ENCOUNTER — Encounter (HOSPITAL_COMMUNITY)
Admission: RE | Disposition: A | Discharge status: Home / Self Care | Payer: Self-pay | Source: Home / Self Care | Attending: Surgery

## 2020-07-30 ENCOUNTER — Observation Stay (HOSPITAL_COMMUNITY)
Admission: RE | Admit: 2020-07-30 | Discharge: 2020-07-31 | Disposition: A | Discharge status: Home / Self Care | Payer: BC Managed Care – PPO | Attending: Surgery | Admitting: Surgery

## 2020-07-30 ENCOUNTER — Ambulatory Visit (HOSPITAL_COMMUNITY): Payer: BC Managed Care – PPO

## 2020-07-30 DIAGNOSIS — D6481 Anemia due to antineoplastic chemotherapy: Secondary | ICD-10-CM | POA: Diagnosis not present

## 2020-07-30 DIAGNOSIS — M810 Age-related osteoporosis without current pathological fracture: Secondary | ICD-10-CM | POA: Diagnosis not present

## 2020-07-30 DIAGNOSIS — C50912 Malignant neoplasm of unspecified site of left female breast: Secondary | ICD-10-CM | POA: Diagnosis not present

## 2020-07-30 DIAGNOSIS — N6091 Unspecified benign mammary dysplasia of right breast: Secondary | ICD-10-CM | POA: Diagnosis not present

## 2020-07-30 DIAGNOSIS — Z171 Estrogen receptor negative status [ER-]: Secondary | ICD-10-CM

## 2020-07-30 DIAGNOSIS — Z4001 Encounter for prophylactic removal of breast: Secondary | ICD-10-CM | POA: Diagnosis not present

## 2020-07-30 DIAGNOSIS — Z87891 Personal history of nicotine dependence: Secondary | ICD-10-CM | POA: Diagnosis not present

## 2020-07-30 DIAGNOSIS — Z170 Estrogen receptor positive status [ER+]: Secondary | ICD-10-CM | POA: Diagnosis not present

## 2020-07-30 DIAGNOSIS — C50412 Malignant neoplasm of upper-outer quadrant of left female breast: Secondary | ICD-10-CM | POA: Diagnosis not present

## 2020-07-30 DIAGNOSIS — C50922 Malignant neoplasm of unspecified site of left male breast: Secondary | ICD-10-CM

## 2020-07-30 HISTORY — PX: SENTINEL NODE BIOPSY: SHX6608

## 2020-07-30 HISTORY — PX: SIMPLE MASTECTOMY WITH AXILLARY SENTINEL NODE BIOPSY: SHX6098

## 2020-07-30 SURGERY — SIMPLE MASTECTOMY
Anesthesia: Regional | Site: Breast | Laterality: Left

## 2020-07-30 MED ORDER — MIDAZOLAM HCL 2 MG/2ML IJ SOLN
INTRAMUSCULAR | Status: AC
  Administered 2020-07-30: 2 mg via INTRAVENOUS
  Filled 2020-07-30: qty 2

## 2020-07-30 MED ORDER — METOPROLOL TARTRATE 5 MG/5ML IV SOLN: 5.0000 mg | Freq: Four times a day (QID) | INTRAVENOUS | Status: DC | PRN

## 2020-07-30 MED ORDER — HEMOSTATIC AGENTS (NO CHARGE) OPTIME
TOPICAL | Status: DC | PRN
  Administered 2020-07-30: 3 via TOPICAL

## 2020-07-30 MED ORDER — PROPOFOL 10 MG/ML IV BOLUS
INTRAVENOUS | Status: DC | PRN
  Administered 2020-07-30: 200 mg via INTRAVENOUS

## 2020-07-30 MED ORDER — SODIUM CHLORIDE 0.9 % IV SOLN
INTRAVENOUS | Status: DC | PRN
  Administered 2020-07-30: 500 mL

## 2020-07-30 MED ORDER — ONDANSETRON 4 MG PO TBDP: 4.0000 mg | ORAL_TABLET | Freq: Four times a day (QID) | ORAL | Status: DC | PRN

## 2020-07-30 MED ORDER — FENTANYL CITRATE (PF) 100 MCG/2ML IJ SOLN: 50.0000 ug | Freq: Once | INTRAMUSCULAR | Status: AC

## 2020-07-30 MED ORDER — FENTANYL CITRATE (PF) 100 MCG/2ML IJ SOLN
INTRAMUSCULAR | Status: AC
  Filled 2020-07-30: qty 2

## 2020-07-30 MED ORDER — CHLORHEXIDINE GLUCONATE CLOTH 2 % EX PADS: 6.0000 | MEDICATED_PAD | Freq: Once | CUTANEOUS | Status: DC

## 2020-07-30 MED ORDER — ONDANSETRON HCL 4 MG/2ML IJ SOLN
INTRAMUSCULAR | Status: AC
  Filled 2020-07-30: qty 2

## 2020-07-30 MED ORDER — PANTOPRAZOLE SODIUM 40 MG PO TBEC
40.0000 mg | DELAYED_RELEASE_TABLET | Freq: Every day | ORAL | Status: DC
  Administered 2020-07-30 – 2020-07-31 (×2): 40 mg via ORAL
  Filled 2020-07-30 (×2): qty 1

## 2020-07-30 MED ORDER — METHOCARBAMOL 500 MG PO TABS: 500.0000 mg | ORAL_TABLET | Freq: Four times a day (QID) | ORAL | Status: DC | PRN

## 2020-07-30 MED ORDER — OXYCODONE HCL 5 MG/5ML PO SOLN
5.0000 mg | Freq: Once | ORAL | Status: DC | PRN
Start: 2020-07-30 — End: 2020-07-30

## 2020-07-30 MED ORDER — ACETAMINOPHEN 325 MG PO TABS: 325.0000 mg | ORAL_TABLET | ORAL | Status: DC | PRN

## 2020-07-30 MED ORDER — ACETAMINOPHEN 10 MG/ML IV SOLN: 1000.0000 mg | Freq: Once | INTRAVENOUS | Status: DC | PRN

## 2020-07-30 MED ORDER — FENTANYL CITRATE (PF) 100 MCG/2ML IJ SOLN
INTRAMUSCULAR | Status: AC
  Administered 2020-07-30: 50 ug via INTRAVENOUS
  Filled 2020-07-30: qty 2

## 2020-07-30 MED ORDER — FENTANYL CITRATE (PF) 250 MCG/5ML IJ SOLN
INTRAMUSCULAR | Status: DC | PRN
  Administered 2020-07-30 (×4): 25 ug via INTRAVENOUS

## 2020-07-30 MED ORDER — MIDAZOLAM HCL 2 MG/2ML IJ SOLN: 2.0000 mg | Freq: Once | INTRAMUSCULAR | Status: AC

## 2020-07-30 MED ORDER — PROMETHAZINE HCL 25 MG/ML IJ SOLN
6.2500 mg | INTRAMUSCULAR | Status: DC | PRN
Start: 2020-07-30 — End: 2020-07-30

## 2020-07-30 MED ORDER — TECHNETIUM TC 99M TILMANOCEPT KIT
1.0000 | PACK | Freq: Once | INTRAVENOUS | Status: AC | PRN
  Administered 2020-07-30: 1 via INTRADERMAL

## 2020-07-30 MED ORDER — FENTANYL CITRATE (PF) 100 MCG/2ML IJ SOLN
25.0000 ug | INTRAMUSCULAR | Status: DC | PRN
  Administered 2020-07-30: 50 ug via INTRAVENOUS

## 2020-07-30 MED ORDER — LACTATED RINGERS IV SOLN: INTRAVENOUS | Status: DC | PRN

## 2020-07-30 MED ORDER — CEFAZOLIN SODIUM-DEXTROSE 2-4 GM/100ML-% IV SOLN
2.0000 g | INTRAVENOUS | Status: AC
Start: 2020-07-30 — End: 2020-07-30
  Administered 2020-07-30: 2 g via INTRAVENOUS
  Filled 2020-07-30: qty 100

## 2020-07-30 MED ORDER — OXYCODONE HCL 5 MG PO TABS: 5.0000 mg | ORAL_TABLET | ORAL | Status: DC | PRN

## 2020-07-30 MED ORDER — ZOLPIDEM TARTRATE 5 MG PO TABS: 5.0000 mg | ORAL_TABLET | Freq: Every evening | ORAL | Status: DC | PRN

## 2020-07-30 MED ORDER — ENOXAPARIN SODIUM 40 MG/0.4ML IJ SOSY
40.0000 mg | PREFILLED_SYRINGE | INTRAMUSCULAR | Status: DC
  Administered 2020-07-31: 40 mg via SUBCUTANEOUS
  Filled 2020-07-30: qty 0.4

## 2020-07-30 MED ORDER — ONDANSETRON HCL 4 MG/2ML IJ SOLN: 4.0000 mg | Freq: Four times a day (QID) | INTRAMUSCULAR | Status: DC | PRN

## 2020-07-30 MED ORDER — SIMETHICONE 80 MG PO CHEW
40.0000 mg | CHEWABLE_TABLET | Freq: Four times a day (QID) | ORAL | Status: DC | PRN
Start: 2020-07-30 — End: 2020-07-31

## 2020-07-30 MED ORDER — VANCOMYCIN HCL 1000 MG IV SOLR
INTRAVENOUS | Status: DC | PRN
Start: 2020-07-30 — End: 2020-07-30
  Administered 2020-07-30: 1000 mg via TOPICAL

## 2020-07-30 MED ORDER — FENTANYL CITRATE (PF) 250 MCG/5ML IJ SOLN
INTRAMUSCULAR | Status: AC
  Filled 2020-07-30: qty 5

## 2020-07-30 MED ORDER — DEXAMETHASONE SODIUM PHOSPHATE 10 MG/ML IJ SOLN
INTRAMUSCULAR | Status: DC | PRN
  Administered 2020-07-30: 10 mg via INTRAVENOUS

## 2020-07-30 MED ORDER — DEXTROSE-NACL 5-0.9 % IV SOLN: INTRAVENOUS | Status: DC

## 2020-07-30 MED ORDER — SODIUM CHLORIDE 0.9 % IV SOLN
Freq: Once | INTRAVENOUS | Status: DC
  Filled 2020-07-30: qty 10

## 2020-07-30 MED ORDER — PROPOFOL 10 MG/ML IV BOLUS
INTRAVENOUS | Status: AC
  Filled 2020-07-30: qty 20

## 2020-07-30 MED ORDER — VANCOMYCIN HCL 1000 MG IV SOLR
INTRAVENOUS | Status: AC
  Filled 2020-07-30: qty 1000

## 2020-07-30 MED ORDER — ACETAMINOPHEN 500 MG PO TABS
1000.0000 mg | ORAL_TABLET | Freq: Four times a day (QID) | ORAL | Status: DC
  Administered 2020-07-30 – 2020-07-31 (×3): 1000 mg via ORAL
  Filled 2020-07-30 (×3): qty 2

## 2020-07-30 MED ORDER — SCOPOLAMINE 1 MG/3DAYS TD PT72
1.0000 | MEDICATED_PATCH | TRANSDERMAL | Status: DC
  Administered 2020-07-30: 1.5 mg via TRANSDERMAL
  Filled 2020-07-30: qty 1

## 2020-07-30 MED ORDER — 0.9 % SODIUM CHLORIDE (POUR BTL) OPTIME
TOPICAL | Status: DC | PRN
  Administered 2020-07-30: 1000 mL

## 2020-07-30 MED ORDER — TRAMADOL HCL 50 MG PO TABS
50.0000 mg | ORAL_TABLET | Freq: Four times a day (QID) | ORAL | Status: DC | PRN
  Administered 2020-07-31: 50 mg via ORAL
  Filled 2020-07-30: qty 1

## 2020-07-30 MED ORDER — ACETAMINOPHEN 160 MG/5ML PO SOLN: 325.0000 mg | ORAL | Status: DC | PRN

## 2020-07-30 MED ORDER — CHLORHEXIDINE GLUCONATE 0.12 % MT SOLN
OROMUCOSAL | Status: AC
  Administered 2020-07-30: 15 mL
  Filled 2020-07-30: qty 15

## 2020-07-30 MED ORDER — ONDANSETRON HCL 4 MG/2ML IJ SOLN
INTRAMUSCULAR | Status: DC | PRN
  Administered 2020-07-30: 4 mg via INTRAVENOUS

## 2020-07-30 MED ORDER — OXYCODONE HCL 5 MG PO TABS: 5.0000 mg | ORAL_TABLET | Freq: Once | ORAL | Status: DC | PRN

## 2020-07-30 MED ORDER — DEXAMETHASONE SODIUM PHOSPHATE 10 MG/ML IJ SOLN
INTRAMUSCULAR | Status: AC
  Filled 2020-07-30: qty 1

## 2020-07-30 MED ORDER — AMISULPRIDE (ANTIEMETIC) 5 MG/2ML IV SOLN: 10.0000 mg | Freq: Once | INTRAVENOUS | Status: DC | PRN

## 2020-07-30 MED FILL — Polymyxin B Sulfate For Inj 500000 Unit: INTRAMUSCULAR | Qty: 1 | Status: AC

## 2020-07-30 MED FILL — Sodium Chloride IV Soln 0.9%: INTRAVENOUS | Qty: 500 | Status: AC

## 2020-07-30 MED FILL — Sodium Chloride IV Soln 0.9%: INTRAVENOUS | Qty: 500 | Status: CN

## 2020-07-30 SURGICAL SUPPLY — 35 items
APPLIER CLIP 9.375 MED OPEN (MISCELLANEOUS) ×4
BINDER BREAST LRG (GAUZE/BANDAGES/DRESSINGS) ×4 IMPLANT
BINDER BREAST XLRG (GAUZE/BANDAGES/DRESSINGS) IMPLANT
CANISTER SUCT 3000ML PPV (MISCELLANEOUS) ×4 IMPLANT
CHLORAPREP W/TINT 26 (MISCELLANEOUS) ×4 IMPLANT
CLIP APPLIE 9.375 MED OPEN (MISCELLANEOUS) ×2 IMPLANT
COVER SURGICAL LIGHT HANDLE (MISCELLANEOUS) ×4 IMPLANT
DERMABOND ADVANCED (GAUZE/BANDAGES/DRESSINGS) ×2
DERMABOND ADVANCED .7 DNX12 (GAUZE/BANDAGES/DRESSINGS) ×2 IMPLANT
DRAIN CHANNEL 19F RND (DRAIN) ×8 IMPLANT
DRAPE LAPAROSCOPIC ABDOMINAL (DRAPES) ×4 IMPLANT
ELECT REM PT RETURN 9FT ADLT (ELECTROSURGICAL) ×4
ELECTRODE REM PT RTRN 9FT ADLT (ELECTROSURGICAL) ×2 IMPLANT
EVACUATOR SILICONE 100CC (DRAIN) ×8 IMPLANT
GAUZE SPONGE 4X4 12PLY STRL (GAUZE/BANDAGES/DRESSINGS) ×4 IMPLANT
GLOVE BIO SURGEON STRL SZ8 (GLOVE) ×4 IMPLANT
GLOVE SRG 8 PF TXTR STRL LF DI (GLOVE) ×2 IMPLANT
GLOVE SURG UNDER POLY LF SZ8 (GLOVE) ×2
GOWN STRL REUS W/ TWL LRG LVL3 (GOWN DISPOSABLE) ×4 IMPLANT
GOWN STRL REUS W/ TWL XL LVL3 (GOWN DISPOSABLE) ×2 IMPLANT
GOWN STRL REUS W/TWL LRG LVL3 (GOWN DISPOSABLE) ×4
GOWN STRL REUS W/TWL XL LVL3 (GOWN DISPOSABLE) ×2
HEMOSTAT ARISTA ABSORB 3G PWDR (HEMOSTASIS) ×4 IMPLANT
KIT BASIN OR (CUSTOM PROCEDURE TRAY) ×4 IMPLANT
KIT TURNOVER KIT B (KITS) ×4 IMPLANT
NS IRRIG 1000ML POUR BTL (IV SOLUTION) ×4 IMPLANT
PACK GENERAL/GYN (CUSTOM PROCEDURE TRAY) ×4 IMPLANT
PAD ARMBOARD 7.5X6 YLW CONV (MISCELLANEOUS) ×4 IMPLANT
PENCIL SMOKE EVACUATOR (MISCELLANEOUS) ×4 IMPLANT
SPECIMEN JAR X LARGE (MISCELLANEOUS) ×4 IMPLANT
SUT ETHILON 3 0 FSL (SUTURE) ×8 IMPLANT
SUT MNCRL AB 4-0 PS2 18 (SUTURE) ×4 IMPLANT
SUT VIC AB 3-0 SH 18 (SUTURE) ×8 IMPLANT
TOWEL GREEN STERILE (TOWEL DISPOSABLE) ×4 IMPLANT
TOWEL GREEN STERILE FF (TOWEL DISPOSABLE) ×4 IMPLANT

## 2020-07-30 NOTE — Interval H&P Note (Signed)
History and Physical Interval Note:  07/30/2020 1:17 PM  Kathryn [name]  has presented today for surgery, with the diagnosis of RECURRENT LEFT BREAST CANCER HIGH RISK RIGHT.  The various methods of treatment have been discussed with the patient and family. After consideration of risks, benefits and other options for treatment, the patient has consented to  Procedure(s): BILATERAL SIMPLE MASTECTOMY (Bilateral) LEFT SENTINEL NODE BIOPSY (Left) as a surgical intervention.  The patient's history has been reviewed, patient examined, no change in status, stable for surgery.  I have reviewed the patient's chart and labs.  Questions were answered to the patient's satisfaction.     [place]

## 2020-07-30 NOTE — Transfer of Care (Signed)
Immediate Anesthesia Transfer of Care Note  Patient: Kathryn [name]  Procedure(s) Performed: BILATERAL SIMPLE MASTECTOMY (Bilateral Breast) LEFT SENTINEL NODE BIOPSY (Left Breast)  Patient Location: PACU  Anesthesia Type:GA combined with regional for post-op pain  Level of Consciousness: awake, alert  and oriented  Airway & Oxygen Therapy: Patient Spontanous Breathing and Patient connected to face mask oxygen  Post-op Assessment: Report given to RN and Post -op Vital signs reviewed and stable  Post vital signs: Reviewed and stable  Last Vitals:  Vitals Value Taken Time  BP 152/91 07/30/20 1556  Temp    Pulse 108 07/30/20 1558  Resp 25 07/30/20 1558  SpO2 97 % 07/30/20 1558  Vitals shown include unvalidated device data.  Last Pain:  Vitals:   07/30/20 1136  TempSrc: Oral  PainSc:          Complications: No complications documented.

## 2020-07-30 NOTE — Anesthesia Procedure Notes (Signed)
Procedure Name: LMA Insertion Date/Time: 07/30/2020 1:43 PM Performed by: Valda Favia, CRNA Pre-anesthesia Checklist: Patient identified, Emergency Drugs available, Suction available, Patient being monitored and Timeout performed Oxygen Delivery Method: Circle system utilized Preoxygenation: Pre-oxygenation with 100% oxygen Induction Type: IV induction LMA: LMA inserted LMA Size: 4.0 Placement Confirmation: positive ETCO2 and breath sounds checked- equal and bilateral Dental Injury: Teeth and Oropharynx as per pre-operative assessment

## 2020-07-30 NOTE — Op Note (Signed)
Preoperative diagnosis: Recurrent left breast cancer  Postop diagnosis: Same  Procedure: Left simple mastectomy with sentinel lymph node mapping and prophylactic risk reducing simple mastectomy  Surgeon: Erroll Luna, MD  Assistant: Dr Ambrose Finland MD  Anesthesia: General with left pectoral block  EBL: 70 cc  Specimen: Bilateral breast tissue to pathology 1 left single axillary node hot  Drains:  Two 19 round drains   IV fluids: Per anesthesia record  Indications for procedure: The patient presents for left sympathectomy due to recurrent breast cancer.  She has history of previous treatment of breast cancer with breast conserving surgery, radiation therapy and chemotherapy.  She recurred 3 months ago underwent chemotherapy and presents for left simple mastectomy with right risk reducing mastectomy.  She understood no survival advantage to reduce risk with right mastectomy.  She did not want reconstruction.The surgical and non surgical options have been discussed with the patient.  Risks of surgery include bleeding,  Infection,  Flap necrosis,  Tissue loss,  Chronic pain, death, Numbness,  And the need for additional procedures.  Reconstruction options also have been discussed with the patient as well.  The patient agrees to proceed.   Description of procedure: The patient was met in the holding area and questions were answered.  She underwent pectoral block on the left and injection of technetium sulfur colloid, radiology protocol.  She was placed supine on the OR table.  After induction of general esthesia, prepped and draped in sterile fashion timeout performed.  Proper patient, site and procedure were verified.  Right side was done first.  Curvilinear incisions were made above and below the nipple  Areolar complex.  Superior skin flap taken to the clavicle.  Of note her right side port was preserved.  Inferior skin flap taken down to the inferior mammary fold.  The breast was dissected off  the chest wall in a medial to lateral fashion until the lateral attachments were encountered.  The breast was then divided.  Single stitch placed superior.  Hemostasis achieved with cautery.  Moist gauze placed into the wound.  To the left side a superior and inferior incision was made above and below the nipple areolar complex.  Inferior skin flap taken to the inferior mammary fold.  Superior skin flap taken to the clavicle.  Breast dissected in a medial to lateral fashion into the lateral attachments were encountered where it was amputated.  Cautery used for hemostasis.  Neoprobe used and a single hot sentinel node was identified in the left axilla.  There is no other evidence of lymphadenopathy or bulky adenopathy.  She had significant scarring from previous sentinel lymph node mapping procedure in the past.  Long thoracic nerve, thoracodorsal trunk and axillary vein were preserved.  Both wounds were irrigated with antibiotic solution.  Both sides were then hemostatic with cautery.  Arista was placed on both sides.  Vancomycin powder was placed into both sides.  Small incisions were made both inferior flaps for placement of 19 round drain.  Wound was closed with 3-0 Vicryl and 4-0 Monocryl.  Dermabond applied.  Breast binder placed.  All counts were correct.  Patient was awoke extubated taken to recovery in satisfactory condition.

## 2020-07-30 NOTE — Anesthesia Preprocedure Evaluation (Addendum)
Anesthesia Evaluation  Patient identified by MRN, date of birth, ID band Patient awake    Reviewed: Allergy & Precautions, NPO status , Patient's Chart, lab work & pertinent test results  Airway Mallampati: II  TM Distance: >3 FB Neck ROM: Full    Dental  (+) Teeth Intact, Dental Advisory Given   Pulmonary    breath sounds clear to auscultation- rhonchi       Cardiovascular negative cardio ROS   Rhythm:Regular Rate:Normal     Neuro/Psych  Headaches,    GI/Hepatic Neg liver ROS, GERD  Medicated,  Endo/Other  negative endocrine ROS  Renal/GU negative Renal ROS     Musculoskeletal negative musculoskeletal ROS (+)   Abdominal Normal abdominal exam  (+)   Peds  Hematology negative hematology ROS (+)   Anesthesia Other Findings   Reproductive/Obstetrics                            Anesthesia Physical Anesthesia Plan  ASA: II  Anesthesia Plan: General   Post-op Pain Management: GA combined w/ Regional for post-op pain   Induction: Intravenous  PONV Risk Score and Plan: 4 or greater and Ondansetron, Dexamethasone, Midazolam and Scopolamine patch - Pre-op  Airway Management Planned: LMA  Additional Equipment: None  Intra-op Plan:   Post-operative Plan: Extubation in OR  Informed Consent: I have reviewed the patients History and Physical, chart, labs and discussed the procedure including the risks, benefits and alternatives for the proposed anesthesia with the patient or authorized representative who has indicated his/her understanding and acceptance.     Dental advisory given  Plan Discussed with: CRNA  Anesthesia Plan Comments:        Anesthesia Quick Evaluation

## 2020-07-31 ENCOUNTER — Encounter (HOSPITAL_COMMUNITY): Payer: Self-pay | Admitting: Surgery

## 2020-07-31 DIAGNOSIS — Z87891 Personal history of nicotine dependence: Secondary | ICD-10-CM | POA: Diagnosis not present

## 2020-07-31 DIAGNOSIS — C50912 Malignant neoplasm of unspecified site of left female breast: Secondary | ICD-10-CM | POA: Diagnosis not present

## 2020-07-31 LAB — CBC
HCT: 33.5 % — ABNORMAL LOW (ref 36.0–46.0)
Hemoglobin: 10.8 g/dL — ABNORMAL LOW (ref 12.0–15.0)
MCH: 31.9 pg (ref 26.0–34.0)
MCHC: 32.2 g/dL (ref 30.0–36.0)
MCV: 98.8 fL (ref 80.0–100.0)
Platelets: 184 10*3/uL (ref 150–400)
RBC: 3.39 MIL/uL — ABNORMAL LOW (ref 3.87–5.11)
RDW: 15.5 % (ref 11.5–15.5)
WBC: 8.7 10*3/uL (ref 4.0–10.5)
nRBC: 0 % (ref 0.0–0.2)

## 2020-07-31 LAB — BASIC METABOLIC PANEL
Anion gap: 10 (ref 5–15)
BUN: 13 mg/dL (ref 6–20)
CO2: 22 mmol/L (ref 22–32)
Calcium: 8.6 mg/dL — ABNORMAL LOW (ref 8.9–10.3)
Chloride: 103 mmol/L (ref 98–111)
Creatinine, Ser: 0.76 mg/dL (ref 0.44–1.00)
GFR, Estimated: >60 mL/min (ref >60)
Glucose, Bld: 144 mg/dL — ABNORMAL HIGH (ref 70–99)
Potassium: 4.1 mmol/L (ref 3.5–5.1)
Sodium: 135 mmol/L (ref 135–145)

## 2020-07-31 MED ORDER — TRAMADOL HCL 50 MG PO TABS: 50.0000 mg | ORAL_TABLET | Freq: Four times a day (QID) | ORAL | 0 refills | Status: DC | PRN

## 2020-07-31 MED ORDER — IBUPROFEN 800 MG PO TABS: 800.0000 mg | ORAL_TABLET | Freq: Three times a day (TID) | ORAL | 0 refills | Status: DC | PRN

## 2020-07-31 NOTE — Discharge Summary (Signed)
Physician Discharge Summary  Patient ID: SYLA DEVOSS MRN: [id number] DOB/AGE: 1960-03-18 60 y.o.  Admit date: 07/30/2020 Discharge date: 07/31/2020  Admission Diagnoses: Stage II left breast cancer  Discharge Diagnoses:  Active Problems:   Breast cancer, stage 2, left [facility])   Discharged Condition: good  Hospital Course: Patient did well after bilateral mastectomy.  Her postop course was unremarkable.  She was tolerating her diet, tolerating pain medication and able to ambulate.  Drain output was appropriate.  She was discharged home postoperative day 1 in good condition.  Consults: None    Treatments: Bilateral simple mastectomies left axillary sentinel lymph node mapping CBC    Component Value Date/Time   WBC 8.7 07/31/2020 0034   RBC 3.39 (L) 07/31/2020 0034   HGB 10.8 (L) 07/31/2020 0034   HGB 11.3 (L) 06/19/2020 1132   HCT 33.5 (L) 07/31/2020 0034   PLT 184 07/31/2020 0034   PLT 108 (L) 06/19/2020 1132   MCV 98.8 07/31/2020 0034   MCH 31.9 07/31/2020 0034   MCHC 32.2 07/31/2020 0034   RDW 15.5 07/31/2020 0034   LYMPHSABS 1.0 07/26/2020 0926   MONOABS 0.5 07/26/2020 0926   EOSABS 0.1 07/26/2020 0926   BASOSABS 0.1 07/26/2020 0926    Discharge Exam: Blood pressure 108/67, pulse 85, temperature 97.8 F (36.6 C), temperature source Oral, resp. rate 17, height 5\' 4"  (1.626 m), weight 73.5 kg, SpO2 99 %. General appearance: alert and cooperative Resp: clear to auscultation bilaterally Cardio: Normal sinus rhythm Incision/Wound: Incisions clean dry intact.  Skin flaps viable.  No evidence of hematoma or bruising.  JP drain is serosanguineous bilaterally.  Disposition: Discharge disposition: 01-Home or Self Care       Discharge Instructions    Diet - low sodium heart healthy   Complete by: As directed    Increase activity slowly   Complete by: As directed      Allergies as of 07/31/2020   No Known Allergies     Medication List    TAKE these medications    cholecalciferol 25 MCG (1000 UNIT) tablet Commonly known as: VITAMIN D3 Take 1,000 Units by mouth in the morning.   docusate sodium 100 MG capsule Commonly known as: COLACE Take 2 capsules (200 mg total) by mouth in the morning and at bedtime. Start the morning of chemotherapy and continue for one week   ibuprofen 800 MG tablet Commonly known as: ADVIL Take 1 tablet (800 mg total) by mouth every 8 (eight) hours as needed. What changed: Another medication with the same name was added. Make sure you understand how and when to take each.   ibuprofen 800 MG tablet Commonly known as: ADVIL Take 1 tablet (800 mg total) by mouth every 8 (eight) hours as needed. What changed: You were already taking a medication with the same name, and this prescription was added. Make sure you understand how and when to take each.   loratadine 10 MG tablet Commonly known as: Claritin Take 1 tablet (10 mg total) by mouth daily. What changed: when to take this   omeprazole 40 MG capsule Commonly known as: PRILOSEC Take 1 capsule (40 mg total) by mouth daily.   ondansetron 8 MG tablet Commonly known as: ZOFRAN Take 1 tablet (8 mg total) by mouth 2 (two) times daily as needed for nausea or vomiting. Start day 4 after chemo.   traMADol 50 MG tablet Commonly known as: Ultram Take 1 tablet (50 mg total) by mouth every 6 (six) hours as  needed.        Signed: Joyice Faster Ifrah Vest 07/31/2020, 8:46 AM

## 2020-07-31 NOTE — Plan of Care (Signed)
  Problem: Education: Goal: Knowledge of disease or condition will improve Outcome: Progressing   Problem: Activity: Goal: Ability to maintain or regain function will improve Outcome: Progressing   Problem: Pain Management: Goal: Expressions of feelings of enhanced comfort will increase Outcome: Progressing

## 2020-07-31 NOTE — Anesthesia Postprocedure Evaluation (Signed)
Anesthesia Post Note  Patient: Kathryn [name]  Procedure(s) Performed: BILATERAL SIMPLE MASTECTOMY (Bilateral Breast) LEFT SENTINEL NODE BIOPSY (Left Breast)     Patient location during evaluation: PACU Anesthesia Type: Regional and General Level of consciousness: awake and alert Pain management: pain level controlled Vital Signs Assessment: post-procedure vital signs reviewed and stable Respiratory status: spontaneous breathing, nonlabored ventilation, respiratory function stable and patient connected to nasal cannula oxygen Cardiovascular status: blood pressure returned to baseline and stable Postop Assessment: no apparent nausea or vomiting Anesthetic complications: no   No complications documented.  Effie Berkshire

## 2020-07-31 NOTE — Plan of Care (Signed)
  Problem: Education: Goal: Knowledge of disease or condition will improve Outcome: Progressing   Problem: Activity: Goal: Ability to maintain or regain function will improve Outcome: Progressing   Problem: Clinical Measurements: Goal: Postoperative complications will be avoided or minimized Outcome: Progressing   Problem: Self-Concept: Goal: Ability to verbalize positive feelings about self will improve Outcome: Progressing   Problem: Pain Management: Goal: Expressions of feelings of enhanced comfort will increase Outcome: Progressing   Problem: Skin Integrity: Goal: Demonstration of wound healing without infection will improve Outcome: Progressing

## 2020-07-31 NOTE — Discharge Instructions (Signed)
[facility] [place] °[phone] ° °MASTECTOMY: POST OP INSTRUCTIONS ° °Always review your discharge instruction sheet given to you by the facility where your surgery was performed. °IF YOU HAVE DISABILITY OR FAMILY LEAVE FORMS, YOU MUST BRING THEM TO THE OFFICE FOR PROCESSING.   °DO NOT GIVE THEM TO YOUR DOCTOR. °A prescription for pain medication may be given to you upon discharge.  Take your pain medication as prescribed, if needed.  If narcotic pain medicine is not needed, then you may take acetaminophen (Tylenol) or ibuprofen (Advil) as needed. °1. Take your usually prescribed medications unless otherwise directed. °2. If you need a refill on your pain medication, please contact your pharmacy.  They will contact our office to request authorization.  Prescriptions will not be filled after 5pm or on week-ends. °3. You should follow a light diet the first few days after arrival home, such as soup and crackers, etc.  Resume your normal diet the day after surgery. °4. Most patients will experience some swelling and bruising on the chest and underarm.  Ice packs will help.  Swelling and bruising can take several days to resolve.  °5. It is common to experience some constipation if taking pain medication after surgery.  Increasing fluid intake and taking a stool softener (such as Colace) will usually help or prevent this problem from occurring.  A mild laxative (Milk of Magnesia or Miralax) should be taken according to package instructions if there are no bowel movements after 48 hours. °6. Unless discharge instructions indicate otherwise, leave your bandage dry and in place until your next appointment in 3-5 days.  You may take a limited sponge bath.  No tube baths or showers until the drains are removed.  You may have steri-strips (small skin tapes) in place directly over the incision.  These strips should be left on the skin for 7-10 days.  If your surgeon used skin glue on the incision, you may  shower in 24 hours.  The glue will flake off over the next 2-3 weeks.  Any sutures or staples will be removed at the office during your follow-up visit. °7. DRAINS:  If you have drains in place, it is important to keep a list of the amount of drainage produced each day in your drains.  Before leaving the hospital, you should be instructed on drain care.  Call our office if you have any questions about your drains. °8. ACTIVITIES:  You may resume regular (light) daily activities beginning the next day--such as daily self-care, walking, climbing stairs--gradually increasing activities as tolerated.  You may have sexual intercourse when it is comfortable.  Refrain from any heavy lifting or straining until approved by your doctor. °a. You may drive when you are no longer taking prescription pain medication, you can comfortably wear a seatbelt, and you can safely maneuver your car and apply brakes. °b. RETURN TO WORK:  __________________________________________________________ °9. You should see your doctor in the office for a follow-up appointment approximately 3-5 days after your surgery.  Your doctor’s nurse will typically make your follow-up appointment when she calls you with your pathology report.  Expect your pathology report 2-3 business days after your surgery.  You may call to check if you do not hear from us after three days.   °10. OTHER INSTRUCTIONS: ______________________________________________________________________________________________ ____________________________________________________________________________________________ °WHEN TO CALL YOUR DOCTOR: °1. Fever over 101.0 °2. Nausea and/or vomiting °3. Extreme swelling or bruising °4. Continued bleeding from incision. °5. Increased pain, redness, or drainage from the incision. °  The clinic staff is available to answer your questions during regular business hours.  Please don’t hesitate to call and ask to speak to one of the nurses for clinical  concerns.  If you have a medical emergency, go to the nearest emergency room or call 911.  A surgeon from [facility] is always on call at the hospital. °[address], [address], [place], [place]  [address] ? [address] [address], [place], [place]   [address] °[phone] ? [phone] ? FAX [phone] °Web site: [email or web address] °

## 2020-07-31 NOTE — Plan of Care (Signed)
  Problem: Education: Goal: Knowledge of disease or condition will improve 07/31/2020 1022 by Camillia Herter, RN Outcome: Adequate for Discharge 07/31/2020 1022 by Camillia Herter, RN Outcome: Progressing   Problem: Activity: Goal: Ability to maintain or regain function will improve 07/31/2020 1022 by Camillia Herter, RN Outcome: Adequate for Discharge 07/31/2020 1022 by Julius Bowels A, RN Outcome: Progressing   Problem: Clinical Measurements: Goal: Postoperative complications will be avoided or minimized 07/31/2020 1022 by Camillia Herter, RN Outcome: Adequate for Discharge 07/31/2020 1022 by Julius Bowels A, RN Outcome: Progressing   Problem: Self-Concept: Goal: Ability to verbalize positive feelings about self will improve 07/31/2020 1022 by Camillia Herter, RN Outcome: Adequate for Discharge 07/31/2020 1022 by Julius Bowels A, RN Outcome: Progressing   Problem: Pain Management: Goal: Expressions of feelings of enhanced comfort will increase 07/31/2020 1022 by Camillia Herter, RN Outcome: Adequate for Discharge 07/31/2020 1022 by Julius Bowels A, RN Outcome: Progressing   Problem: Skin Integrity: Goal: Demonstration of wound healing without infection will improve 07/31/2020 1022 by Camillia Herter, RN Outcome: Adequate for Discharge 07/31/2020 1022 by Camillia Herter, RN Outcome: Progressing

## 2020-08-05 ENCOUNTER — Encounter: Payer: Self-pay | Admitting: *Deleted

## 2020-08-06 LAB — SURGICAL PATHOLOGY
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
## 2020-08-08 ENCOUNTER — Encounter: Payer: Self-pay | Admitting: Oncology

## 2020-08-08 ENCOUNTER — Encounter: Payer: Self-pay | Admitting: *Deleted

## 2020-08-08 ENCOUNTER — Inpatient Hospital Stay: Payer: BC Managed Care – PPO | Attending: Oncology

## 2020-08-08 ENCOUNTER — Inpatient Hospital Stay (HOSPITAL_BASED_OUTPATIENT_CLINIC_OR_DEPARTMENT_OTHER): Payer: BC Managed Care – PPO | Admitting: Oncology

## 2020-08-08 ENCOUNTER — Other Ambulatory Visit: Payer: Self-pay

## 2020-08-08 VITALS — BP 121/74 | HR 97 | Temp 97.7°F | Resp 18 | Ht 64.0 in | Wt 159.1 lb

## 2020-08-08 DIAGNOSIS — Z452 Encounter for adjustment and management of vascular access device: Secondary | ICD-10-CM | POA: Diagnosis not present

## 2020-08-08 DIAGNOSIS — Z9221 Personal history of antineoplastic chemotherapy: Secondary | ICD-10-CM | POA: Insufficient documentation

## 2020-08-08 DIAGNOSIS — C50412 Malignant neoplasm of upper-outer quadrant of left female breast: Secondary | ICD-10-CM | POA: Diagnosis not present

## 2020-08-08 DIAGNOSIS — Z9013 Acquired absence of bilateral breasts and nipples: Secondary | ICD-10-CM | POA: Diagnosis not present

## 2020-08-08 DIAGNOSIS — M810 Age-related osteoporosis without current pathological fracture: Secondary | ICD-10-CM | POA: Insufficient documentation

## 2020-08-08 DIAGNOSIS — C50912 Malignant neoplasm of unspecified site of left female breast: Secondary | ICD-10-CM | POA: Diagnosis not present

## 2020-08-08 DIAGNOSIS — Z923 Personal history of irradiation: Secondary | ICD-10-CM | POA: Diagnosis not present

## 2020-08-08 DIAGNOSIS — Z171 Estrogen receptor negative status [ER-]: Secondary | ICD-10-CM | POA: Insufficient documentation

## 2020-08-08 DIAGNOSIS — C773 Secondary and unspecified malignant neoplasm of axilla and upper limb lymph nodes: Secondary | ICD-10-CM | POA: Insufficient documentation

## 2020-08-08 LAB — CBC WITH DIFFERENTIAL/PLATELET
Abs Immature Granulocytes: 0.01 10*3/uL (ref 0.00–0.07)
Basophils Absolute: 0 10*3/uL (ref 0.0–0.1)
Basophils Relative: 1 %
Eosinophils Absolute: 0.1 10*3/uL (ref 0.0–0.5)
Eosinophils Relative: 4 %
HCT: 33.7 % — ABNORMAL LOW (ref 36.0–46.0)
Hemoglobin: 11.2 g/dL — ABNORMAL LOW (ref 12.0–15.0)
Immature Granulocytes: 0 %
Lymphocytes Relative: 28 %
Lymphs Abs: 1 10*3/uL (ref 0.7–4.0)
MCH: 32.7 pg (ref 26.0–34.0)
MCHC: 33.2 g/dL (ref 30.0–36.0)
MCV: 98.3 fL (ref 80.0–100.0)
Monocytes Absolute: 0.5 10*3/uL (ref 0.1–1.0)
Monocytes Relative: 14 %
Neutro Abs: 1.9 10*3/uL (ref 1.7–7.7)
Neutrophils Relative %: 53 %
Platelets: 216 10*3/uL (ref 150–400)
RBC: 3.43 MIL/uL — ABNORMAL LOW (ref 3.87–5.11)
RDW: 14.7 % (ref 11.5–15.5)
WBC: 3.6 10*3/uL — ABNORMAL LOW (ref 4.0–10.5)
nRBC: 0 % (ref 0.0–0.2)

## 2020-08-08 LAB — COMPREHENSIVE METABOLIC PANEL
ALT: 14 U/L (ref 0–44)
AST: 18 U/L (ref 15–41)
Albumin: 3.3 g/dL — ABNORMAL LOW (ref 3.5–5.0)
Alkaline Phosphatase: 78 U/L (ref 38–126)
Anion gap: 7 (ref 5–15)
BUN: 9 mg/dL (ref 6–20)
CO2: 24 mmol/L (ref 22–32)
Calcium: 8.7 mg/dL — ABNORMAL LOW (ref 8.9–10.3)
Chloride: 108 mmol/L (ref 98–111)
Creatinine, Ser: 0.62 mg/dL (ref 0.44–1.00)
GFR, Estimated: >60 mL/min (ref >60)
Glucose, Bld: 90 mg/dL (ref 70–99)
Potassium: 4 mmol/L (ref 3.5–5.1)
Sodium: 139 mmol/L (ref 135–145)
Total Bilirubin: 0.4 mg/dL (ref 0.3–1.2)
Total Protein: 6.4 g/dL — ABNORMAL LOW (ref 6.5–8.1)

## 2020-08-12 ENCOUNTER — Encounter: Payer: Self-pay | Admitting: Oncology

## 2020-08-13 ENCOUNTER — Other Ambulatory Visit: Payer: Self-pay | Admitting: *Deleted

## 2020-08-13 MED ORDER — LORATADINE 10 MG PO TABS: 10.0000 mg | ORAL_TABLET | Freq: Every morning | ORAL | 0 refills | Status: DC

## 2020-08-14 ENCOUNTER — Telehealth: Payer: Self-pay | Admitting: Oncology

## 2020-08-14 NOTE — Telephone Encounter (Signed)
Scheduled per 6/16 los. Pt will receive an updated appt calendar

## 2020-08-15 ENCOUNTER — Inpatient Hospital Stay: Payer: BC Managed Care – PPO

## 2020-08-15 ENCOUNTER — Other Ambulatory Visit: Payer: Self-pay

## 2020-08-15 DIAGNOSIS — C773 Secondary and unspecified malignant neoplasm of axilla and upper limb lymph nodes: Secondary | ICD-10-CM | POA: Diagnosis not present

## 2020-08-15 DIAGNOSIS — Z9221 Personal history of antineoplastic chemotherapy: Secondary | ICD-10-CM | POA: Diagnosis not present

## 2020-08-15 DIAGNOSIS — Z923 Personal history of irradiation: Secondary | ICD-10-CM | POA: Diagnosis not present

## 2020-08-15 DIAGNOSIS — Z171 Estrogen receptor negative status [ER-]: Secondary | ICD-10-CM | POA: Diagnosis not present

## 2020-08-15 DIAGNOSIS — Z9013 Acquired absence of bilateral breasts and nipples: Secondary | ICD-10-CM | POA: Diagnosis not present

## 2020-08-15 DIAGNOSIS — Z452 Encounter for adjustment and management of vascular access device: Secondary | ICD-10-CM | POA: Diagnosis not present

## 2020-08-15 DIAGNOSIS — Z95828 Presence of other vascular implants and grafts: Secondary | ICD-10-CM

## 2020-08-15 DIAGNOSIS — C50412 Malignant neoplasm of upper-outer quadrant of left female breast: Secondary | ICD-10-CM | POA: Diagnosis not present

## 2020-08-15 DIAGNOSIS — M810 Age-related osteoporosis without current pathological fracture: Secondary | ICD-10-CM | POA: Diagnosis not present

## 2020-08-15 MED ORDER — SODIUM CHLORIDE 0.9% FLUSH
10.0000 mL | INTRAVENOUS | Status: AC | PRN
Start: 2020-08-15 — End: 2020-08-15
  Administered 2020-08-15: 10 mL
  Filled 2020-08-15: qty 10

## 2020-08-15 MED ORDER — HEPARIN SOD (PORK) LOCK FLUSH 100 UNIT/ML IV SOLN
500.0000 [IU] | INTRAVENOUS | Status: AC | PRN
  Administered 2020-08-15: 500 [IU]
  Filled 2020-08-15: qty 5

## 2020-09-03 ENCOUNTER — Ambulatory Visit: Payer: BC Managed Care – PPO | Admitting: Physical Therapy

## 2020-09-19 ENCOUNTER — Encounter: Payer: Self-pay | Admitting: Oncology

## 2020-09-23 ENCOUNTER — Other Ambulatory Visit: Payer: Self-pay

## 2020-09-23 DIAGNOSIS — C50412 Malignant neoplasm of upper-outer quadrant of left female breast: Secondary | ICD-10-CM

## 2020-09-26 ENCOUNTER — Ambulatory Visit (HOSPITAL_COMMUNITY)
Admission: RE | Admit: 2020-09-26 | Discharge: 2020-09-26 | Disposition: A | Discharge status: Home / Self Care | Payer: BC Managed Care – PPO | Source: Ambulatory Visit | Attending: Oncology | Admitting: Oncology

## 2020-09-26 DIAGNOSIS — Z171 Estrogen receptor negative status [ER-]: Secondary | ICD-10-CM | POA: Insufficient documentation

## 2020-09-26 DIAGNOSIS — C50412 Malignant neoplasm of upper-outer quadrant of left female breast: Secondary | ICD-10-CM | POA: Diagnosis not present

## 2020-09-26 DIAGNOSIS — R059 Cough, unspecified: Secondary | ICD-10-CM | POA: Diagnosis not present

## 2020-09-27 DIAGNOSIS — Z9011 Acquired absence of right breast and nipple: Secondary | ICD-10-CM | POA: Diagnosis not present

## 2020-09-27 DIAGNOSIS — C50912 Malignant neoplasm of unspecified site of left female breast: Secondary | ICD-10-CM | POA: Diagnosis not present

## 2020-09-30 ENCOUNTER — Telehealth: Payer: Self-pay | Admitting: *Deleted

## 2020-09-30 NOTE — Telephone Encounter (Signed)
-----   Message from Gardenia Phlegm, NP sent at 09/30/2020 11:41 AM EDT ----- Chest xray looks good.  No pneumonia, or explanation for her persistent cough seen ----- Message ----- From: Interface, Rad Results In Sent: 09/28/2020  10:59 AM EDT To: Chauncey Cruel, MD

## 2020-09-30 NOTE — Telephone Encounter (Signed)
Notified of message below. States cough is not quite as bad. Worse when she is outside

## 2020-10-10 ENCOUNTER — Inpatient Hospital Stay: Payer: BC Managed Care – PPO | Attending: Oncology

## 2020-10-10 ENCOUNTER — Encounter: Payer: Self-pay | Admitting: Oncology

## 2020-10-10 ENCOUNTER — Other Ambulatory Visit: Payer: Self-pay

## 2020-10-10 DIAGNOSIS — Z452 Encounter for adjustment and management of vascular access device: Secondary | ICD-10-CM | POA: Diagnosis not present

## 2020-10-10 DIAGNOSIS — C773 Secondary and unspecified malignant neoplasm of axilla and upper limb lymph nodes: Secondary | ICD-10-CM | POA: Diagnosis not present

## 2020-10-10 DIAGNOSIS — Z95828 Presence of other vascular implants and grafts: Secondary | ICD-10-CM

## 2020-10-10 DIAGNOSIS — C50412 Malignant neoplasm of upper-outer quadrant of left female breast: Secondary | ICD-10-CM | POA: Diagnosis not present

## 2020-10-10 DIAGNOSIS — Z171 Estrogen receptor negative status [ER-]: Secondary | ICD-10-CM

## 2020-10-10 LAB — CBC WITH DIFFERENTIAL/PLATELET
Abs Immature Granulocytes: 0 10*3/uL (ref 0.00–0.07)
Basophils Absolute: 0 10*3/uL (ref 0.0–0.1)
Basophils Relative: 1 %
Eosinophils Absolute: 0.1 10*3/uL (ref 0.0–0.5)
Eosinophils Relative: 3 %
HCT: 34.1 % — ABNORMAL LOW (ref 36.0–46.0)
Hemoglobin: 11.2 g/dL — ABNORMAL LOW (ref 12.0–15.0)
Immature Granulocytes: 0 %
Lymphocytes Relative: 40 %
Lymphs Abs: 1.4 10*3/uL (ref 0.7–4.0)
MCH: 30.4 pg (ref 26.0–34.0)
MCHC: 32.8 g/dL (ref 30.0–36.0)
MCV: 92.4 fL (ref 80.0–100.0)
Monocytes Absolute: 0.6 10*3/uL (ref 0.1–1.0)
Monocytes Relative: 17 %
Neutro Abs: 1.4 10*3/uL — ABNORMAL LOW (ref 1.7–7.7)
Neutrophils Relative %: 39 %
Platelets: 203 10*3/uL (ref 150–400)
RBC: 3.69 MIL/uL — ABNORMAL LOW (ref 3.87–5.11)
RDW: 13.2 % (ref 11.5–15.5)
WBC: 3.6 10*3/uL — ABNORMAL LOW (ref 4.0–10.5)
nRBC: 0 % (ref 0.0–0.2)

## 2020-10-10 LAB — COMPREHENSIVE METABOLIC PANEL
ALT: 15 U/L (ref 0–44)
AST: 17 U/L (ref 15–41)
Albumin: 3.7 g/dL (ref 3.5–5.0)
Alkaline Phosphatase: 77 U/L (ref 38–126)
Anion gap: 7 (ref 5–15)
BUN: 14 mg/dL (ref 6–20)
CO2: 25 mmol/L (ref 22–32)
Calcium: 9.1 mg/dL (ref 8.9–10.3)
Chloride: 106 mmol/L (ref 98–111)
Creatinine, Ser: 0.69 mg/dL (ref 0.44–1.00)
GFR, Estimated: >60 mL/min (ref >60)
Glucose, Bld: 93 mg/dL (ref 70–99)
Potassium: 4 mmol/L (ref 3.5–5.1)
Sodium: 138 mmol/L (ref 135–145)
Total Bilirubin: 0.4 mg/dL (ref 0.3–1.2)
Total Protein: 6.9 g/dL (ref 6.5–8.1)

## 2020-10-10 MED ORDER — HEPARIN SOD (PORK) LOCK FLUSH 100 UNIT/ML IV SOLN
500.0000 [IU] | INTRAVENOUS | Status: AC | PRN
  Administered 2020-10-10: 500 [IU]

## 2020-10-10 MED ORDER — SODIUM CHLORIDE 0.9% FLUSH
10.0000 mL | INTRAVENOUS | Status: AC | PRN
  Administered 2020-10-10: 10 mL

## 2020-10-11 ENCOUNTER — Ambulatory Visit: Payer: BC Managed Care – PPO | Attending: Surgery | Admitting: Physical Therapy

## 2020-10-11 ENCOUNTER — Encounter: Payer: Self-pay | Admitting: Physical Therapy

## 2020-10-11 DIAGNOSIS — M25611 Stiffness of right shoulder, not elsewhere classified: Secondary | ICD-10-CM | POA: Insufficient documentation

## 2020-10-11 DIAGNOSIS — M25612 Stiffness of left shoulder, not elsewhere classified: Secondary | ICD-10-CM | POA: Diagnosis not present

## 2020-10-11 DIAGNOSIS — Z171 Estrogen receptor negative status [ER-]: Secondary | ICD-10-CM | POA: Insufficient documentation

## 2020-10-11 DIAGNOSIS — C50412 Malignant neoplasm of upper-outer quadrant of left female breast: Secondary | ICD-10-CM | POA: Diagnosis not present

## 2020-10-11 DIAGNOSIS — Z8051 Family history of malignant neoplasm of kidney: Secondary | ICD-10-CM | POA: Insufficient documentation

## 2020-10-11 DIAGNOSIS — Z483 Aftercare following surgery for neoplasm: Secondary | ICD-10-CM | POA: Insufficient documentation

## 2020-10-11 DIAGNOSIS — I972 Postmastectomy lymphedema syndrome: Secondary | ICD-10-CM | POA: Diagnosis not present

## 2020-10-11 DIAGNOSIS — R293 Abnormal posture: Secondary | ICD-10-CM | POA: Diagnosis not present

## 2020-10-11 DIAGNOSIS — M6281 Muscle weakness (generalized): Secondary | ICD-10-CM | POA: Diagnosis not present

## 2020-10-11 NOTE — Therapy (Addendum)
[place], [place], [address] Phone: [phone]   Fax:  [phone]  Physical Therapy Evaluation  Patient Details  Name: Kathryn [name] MRN: [id number] Date of Birth: August 25, 1960 Referring Provider (PT): Dr. Brantley Stage   Encounter Date: 10/11/2020   PT End of Session - 10/11/20 1352     Visit Number 1    Number of Visits 24    Date for PT Re-Evaluation 12/11/20    PT Start Time 0810    PT Stop Time 0900    PT Time Calculation (min) 50 min    Activity Tolerance Patient tolerated treatment well    Behavior During Therapy [facility] for tasks assessed/performed             Past Medical History:  Diagnosis Date   Anemia    during chemo   Breast cancer ([place])    stage 3 - left   Family history of kidney cancer    Family history of melanoma    GERD (gastroesophageal reflux disease)    resolved   Headache    hormone related, none since 1st child was born    Personal history of chemotherapy    Personal history of radiation therapy    Pleurisy     Past Surgical History:  Procedure Laterality Date   AXILLARY LYMPH NODE DISSECTION Left 10/11/2018   Procedure: LEFT AXILLARY LYMPH NODE DISSECTION;  Surgeon: Erroll Luna, MD;  Location: [facility];  Service: General;  Laterality: Left;   BREAST LUMPECTOMY Left 08/2018   BREAST LUMPECTOMY WITH RADIOACTIVE SEED AND SENTINEL LYMPH NODE BIOPSY Left 09/23/2018   Procedure: LEFT BREAST LUMPECTOMY WITH RADIOACTIVE SEED AND LEFT AXILLARY TARGETED LYMPH NODE BIOPY AND  LEFT AXILLARY SENTINEL LYMPH NODE Nitro;  Surgeon: Erroll Luna, MD;  Location: [facility];  Service: General;  Laterality: Left;   CESAREAN SECTION     x3   FRACTURE SURGERY  2020   humerus - w/ Metta Clines. Supple, MD   ORIF HUMERUS FRACTURE Right 01/31/2019   Procedure: OPEN REDUCTION INTERNAL FIXATION (ORIF) Right 3 part proximal humerus fracture;  Surgeon: Justice Britain, MD;   [facility];  Service: Orthopedics;  Laterality: Right;  148mn   PORT-A-CATH REMOVAL Right 07/18/2019   Procedure: REMOVAL PORT-A-CATH;  Surgeon: CErroll Luna MD;  [facility]  Service: General;  Laterality: Right;   PORTACATH PLACEMENT N/A 04/28/2018   Procedure: INSERTION PORT-A-CATH WITH ULTRASOUND;  Surgeon: CErroll Luna MD;  [facility]  Service: General;  Laterality: N/A;   PORTACATH PLACEMENT N/A 04/25/2020   Procedure: INSERTION PORT-A-CATH;  Surgeon: CErroll Luna MD;  [facility];  Service: General;  Laterality: N/A;  60 MIN IN LDOW PLEASE   SENTINEL NODE BIOPSY Left 07/30/2020   Procedure: LEFT SENTINEL NODE BIOPSY;  Surgeon: CErroll Luna MD;  [facility]  Service: General;  Laterality: Left;   SIMPLE MASTECTOMY WITH AXILLARY SENTINEL NODE BIOPSY Bilateral 07/30/2020   Procedure: BILATERAL SIMPLE MASTECTOMY;  Surgeon: CErroll Luna MD;  [facility]  Service: General;  Laterality: Bilateral;   TUBAL LIGATION      There were no vitals filed for this visit.    Subjective Assessment - 10/11/20 0814     Subjective Pt states her bilateral mastecomtomy on June 7 and has recovered well.  She then have Covid along with her family so start of PT has been delayed.  She occasionally has pain in her left axilla. Right  shoulder is doing well.  She is back to work    Pertinent History Neoadjuvant chemotherapy getting through about 50% before having to stop, s/p lumpectomy on 09/23/18 due to [facility] and DCIS.  1/1 lymph nodes positive followed by ALND on 10/11/18 with all 7 nodes negative.  Completed radiation  ER/PR/HER2 negative  Pt had fracture of proximal humerus with ORIF on 01/31/2019. " When it came back in February, I had 4 rounds of chemo and then bilateral mastectomy"  She did not have reconstructive surgery    Patient Stated Goals to come back for rehab post mastectomy    Currently in Pain? Yes    Pain Score 4     Pain Location Axilla                 [facility] PT Assessment - 10/11/20 0001       Assessment   Medical Diagnosis post bilateral mastectomy    Referring Provider (PT) Dr. Brantley Stage    Onset Date/Surgical Date 09/23/18   ORIF of right humerus 01/31/2019   Hand Dominance Right      Precautions   Precaution Comments left breast cancer with ALND and radiation  now bilateral mastectomy      Restrictions   Weight Bearing Restrictions No      Balance Screen   Has the patient fallen in the past 6 months No    Has the patient had a decrease in activity level because of a fear of falling?  No    Is the patient reluctant to leave their home because of a fear of falling?  No      Home Social worker Private residence    Living Arrangements Spouse/significant other;Children    Available Help at Discharge Family      Prior Function   Level of Independence Independent    Vocation Full time employment    Vocation Requirements works for [employer]    Leisure pt walks a couple of miles every day to build up her stamina and tries to stretches with her arms      Cognition   Overall Cognitive Status Within Functional Limits for tasks assessed      Observation/Other Assessments   Observations Pt wth healing incisions on both sides of chest. Left chest incsion apprears more adhere to chest with with congestion along incsion line.  Pt states she has had some minor drainage at lateral incision and axilla feels "thight"  Left arm is visible swollen especailly in upper arm just above elbow. Medial upper arm appears slightly pink, dry and feels warm to the touch. She says it is not tender, but her armpit feels very tight. She has been occasionally wearing her Class 1 Juzo circular knit compression sleeve, but she still is having swelling.    Other Surveys  Quick Dash    Quick DASH  13.64      Sensation   Light Touch Not tested      Coordination   Gross Motor Movements are Fluid and Coordinated  Yes      Posture/Postural Control   Posture/Postural Control Postural limitations    Postural Limitations Rounded Shoulders;Forward head      ROM / Strength   AROM / PROM / Strength AROM      AROM   Overall AROM  Deficits    AROM Assessment Site Shoulder    Right/Left Shoulder Right;Left    Right Shoulder Flexion 145 Degrees    Right  Shoulder ABduction 165 Degrees    Right Shoulder External Rotation 90 Degrees    Left Shoulder Flexion 135 Degrees    Left Shoulder ABduction 130 Degrees    Left Shoulder External Rotation 80 Degrees      Strength   Overall Strength Deficits    Overall Strength Comments bilateral shoulder strength about 3-/5 with pt unable to reach full available range without prompting, but she states she is doing most things with her arms      Palpation   Palpation comment fullness and congestion in left lateral chest as well as left arm.  Left arm is warmer to touch that right arm                  LYMPHEDEMA/ONCOLOGY QUESTIONNAIRE - 10/11/20 0001       Type   Cancer Type Left breast cancer      Surgeries   Mastectomy Date 07/30/20    Lumpectomy Date 09/23/18    Sentinel Lymph Node Biopsy Date 09/23/18    Other Surgery Date 10/13/18   right shoulder ORIF 01/31/2019   Number Lymph Nodes Removed 8   1 positive     Treatment   Active Chemotherapy Treatment No    Past Chemotherapy Treatment Yes    Active Radiation Treatment No    Past Radiation Treatment Yes    Body Site left upper quadrant    Current Hormone Treatment No    Past Hormone Therapy No      What other symptoms do you have   Are you Having Heaviness or Tightness Yes    Are you having Pain Yes    Are you having pitting edema No    Is it Hard or Difficult finding clothes that fit Yes    Do you have infections --   pt going to MD to get checked for cellulitis   Is there Decreased scar mobility Yes   per visual inspection on left side, did not test due to recent surgery   [place]  Sign No      Lymphedema Stage   Stage STAGE 2 SPONTANEOUSLY IRREVERSIBLE      Lymphedema Assessments   Lymphedema Assessments Upper extremities      Right Upper Extremity Lymphedema   15 cm Proximal to Olecranon Process 31 cm    10 cm Proximal to Olecranon Process 31 cm    Olecranon Process 24.5 cm    15 cm Proximal to Ulnar Styloid Process 25 cm    10 cm Proximal to Ulnar Styloid Process 22.7 cm    Just Proximal to Ulnar Styloid Process 1.88 cm    Across Hand at [employer] 19 cm    At [place] of 2nd Digit 6.2 cm      Left Upper Extremity Lymphedema   15 cm Proximal to Olecranon Process 33 cm    10 cm Proximal to Olecranon Process 35 cm    Olecranon Process 28 cm    15 cm Proximal to Ulnar Styloid Process 27 cm    10 cm Proximal to Ulnar Styloid Process 24 cm    Just Proximal to Ulnar Styloid Process 16 cm    Across Hand at [employer] 18 cm    At [place] of 2nd Digit 6 cm                   Quick Dash - 10/11/20 0001     Open a tight or new jar Mild difficulty  Do heavy household chores (wash walls, wash floors) No difficulty    Carry a shopping bag or briefcase No difficulty    Wash your back Mild difficulty    Use a knife to cut food No difficulty    Recreational activities in which you take some force or impact through your arm, shoulder, or hand (golf, hammering, tennis) Mild difficulty    During the past week, to what extent has your arm, shoulder or hand problem interfered with your normal social activities with family, friends, neighbors, or groups? Not at all    During the past week, to what extent has your arm, shoulder or hand problem limited your work or other regular daily activities Not at all    Arm, shoulder, or hand pain. Mild    Tingling (pins and needles) in your arm, shoulder, or hand Mild    Difficulty Sleeping Mild difficulty    DASH Score 13.64 %              Objective measurements completed on examination: See above findings.        [facility] Adult PT Treatment/Exercise - 10/11/20 0001       Self-Care   Self-Care Other Self-Care Comments    Other Self-Care Comments  pt give a piece of medium tg soft to wear for comfort instead of her compression sleeve( wide fold at top) once cellulits is ruled out                      PT Short Term Goals - 10/11/20 1409       PT SHORT TERM GOAL #1   Title Pt will be independent in beginning home exercise program    Time 4    Period Weeks    Status New      PT SHORT TERM GOAL #2   Title Pt / family will be independent in manual lymph drainage and compression bandaging for left arm lymphedema    Time 4    Period Weeks    Status New               PT Long Term Goals - 10/11/20 1410       PT LONG TERM GOAL #1   Title Pt will have compression garments and possibly Flexitouch if needed to continue with long term management of lymphedema at home    Time 8    Period Weeks    Status New      PT LONG TERM GOAL #2   Title Pt will independent in a gradually progressive resisitve exercise program that she can continue in the community    Time 8    Period Weeks    Status New      PT LONG TERM GOAL #3   Title Pt will have reduction in circumference of left arm adn 10 cm proximal to olecranon by 3 cm    Baseline 35 cm on 10/11/2020    Time 8    Period Weeks    Status New      PT LONG TERM GOAL #4   Title Pt will decrease QDASH to < 10    Baseline 13.64 on 10/11/2020    Time 8    Period Weeks    Status New                    Plan - 10/11/20 1354     Clinical Impression Statement Pt comes to PT after bilateral  mastecomy on 07/30/2020 with delay in start of PT due to Covid. She has healing inciscions on both sides of chest with visible adhesion and congestion with fullness in left anterior,  lateral chest and left arm. Just above elbow, left arm is pink and warm to touch.  Pt states she has some tightness in left axilla and had small abount o  foul smelling drainage from chest this past week, but none this morning.  Pt called her surgeon's office and will be checked at 11:00 today.  She would benefit from complete decongestive therapy to manage left arm  and chest lymphedem once cellulits is ruled out or treated.  She continues to have difficulty with swelling despite conservative managment at home and use of circular knit sleeve. She may benefit from long term managment with Flexitouch since  initial attemtps have been unsuccessful.    Personal Factors and Comorbidities Comorbidity 3+    Comorbidities lymph node removal, radiation, bilatera mastectomy    Examination-Activity Limitations Reach Overhead;Dressing;Hygiene/Grooming    Examination-Participation Restrictions Yard Work;Cleaning;Laundry;Meal Prep    Stability/Clinical Decision Making Evolving/Moderate complexity   possible cellulitis   Clinical Decision Making Moderate    Rehab Potential Good    PT Frequency 3x / week    PT Duration 8 weeks   decrease as needed once pt/family able to manage lymphedema at home   PT Treatment/Interventions ADLs/Self Care Home Management;Manual lymph drainage;Patient/family education;Therapeutic exercise;Therapeutic activities;Passive range of motion;Scar mobilization;Manual techniques;Neuromuscular re-education;Taping;Orthotic Fit/Training;Compression bandaging    PT Next Visit Plan did pt have cellulitis? monitor arm and incision healing. Begin complete decongestive therap andt each family MLD and bandaging Order flat knit garments when reduced. Teach and progress strengthening exercise as pt wants to go to [employer]  ?? SOZO monitoring??    Consulted and Agree with Plan of Care Patient             Patient will benefit from skilled therapeutic intervention in order to improve the following deficits and impairments:  Decreased mobility, Impaired perceived functional ability, Decreased range of motion, Decreased  activity tolerance, Decreased coordination, Decreased knowledge of use of DME, Decreased strength, Increased fascial restricitons, Impaired flexibility, Impaired UE functional use, Postural dysfunction, Pain, Decreased scar mobility, Increased edema, Increased muscle spasms  Visit Diagnosis: Aftercare following surgery for neoplasm - Plan: PT plan of care cert/re-cert  Abnormal posture - Plan: PT plan of care cert/re-cert  Postmastectomy lymphedema - Plan: PT plan of care cert/re-cert  Stiffness of right shoulder, not elsewhere classified - Plan: PT plan of care cert/re-cert  Stiffness of left shoulder, not elsewhere classified - Plan: PT plan of care cert/re-cert  Malignant neoplasm of upper-outer quadrant of left breast in female, estrogen receptor negative ([place]) - Plan: PT plan of care cert/re-cert  Muscle weakness (generalized) - Plan: PT plan of care cert/re-cert     Problem List Patient Active Problem List   Diagnosis Date Noted   Breast cancer, stage 2, left ([place]) 07/30/2020   Osteoporosis 04/16/2020   Port-A-Cath in place 05/17/2018   Genetic testing 05/10/2018   Family history of melanoma    Family history of kidney cancer    Malignant neoplasm of upper-outer quadrant of left breast in female, estrogen receptor negative ([place]) 04/11/2018   Chest pain 05/13/2015   Shortness of breath 05/13/2015   Donato Heinz. Owens Shark PT  Norwood Levo 10/11/2020, 2:17 PM  [place], [place], [address] Phone: [phone]  Fax:  [phone]  Name: Kathryn [name] MRN: [id number] Date of Birth: August 10, 1960

## 2020-10-18 ENCOUNTER — Other Ambulatory Visit: Payer: Self-pay

## 2020-10-18 ENCOUNTER — Other Ambulatory Visit (HOSPITAL_COMMUNITY): Payer: Self-pay | Admitting: Student

## 2020-10-18 ENCOUNTER — Ambulatory Visit (HOSPITAL_COMMUNITY)
Admission: RE | Admit: 2020-10-18 | Discharge: 2020-10-18 | Disposition: A | Discharge status: Home / Self Care | Payer: BC Managed Care – PPO | Source: Ambulatory Visit | Attending: Student | Admitting: Student

## 2020-10-18 DIAGNOSIS — M7989 Other specified soft tissue disorders: Secondary | ICD-10-CM

## 2020-10-18 DIAGNOSIS — M79602 Pain in left arm: Secondary | ICD-10-CM

## 2020-10-18 NOTE — Progress Notes (Signed)
Left upper extremity venous duplex completed. Refer to "CV Proc" under chart review to view preliminary results.  10/18/2020 2:42 PM Kelby Aline., MHA, RVT, RDCS, RDMS

## 2020-10-31 DIAGNOSIS — Z452 Encounter for adjustment and management of vascular access device: Secondary | ICD-10-CM | POA: Diagnosis not present

## 2020-10-31 DIAGNOSIS — Z9011 Acquired absence of right breast and nipple: Secondary | ICD-10-CM | POA: Diagnosis not present

## 2020-10-31 DIAGNOSIS — C50912 Malignant neoplasm of unspecified site of left female breast: Secondary | ICD-10-CM | POA: Diagnosis not present

## 2020-11-12 ENCOUNTER — Ambulatory Visit (INDEPENDENT_AMBULATORY_CARE_PROVIDER_SITE_OTHER): Payer: BC Managed Care – PPO | Admitting: Physician Assistant

## 2020-11-12 ENCOUNTER — Other Ambulatory Visit: Payer: Self-pay

## 2020-11-12 ENCOUNTER — Encounter: Payer: Self-pay | Admitting: Physician Assistant

## 2020-11-12 VITALS — BP 110/70 | HR 98 | Temp 97.6°F | Ht 64.0 in | Wt 158.4 lb

## 2020-11-12 DIAGNOSIS — C50912 Malignant neoplasm of unspecified site of left female breast: Secondary | ICD-10-CM | POA: Diagnosis not present

## 2020-11-12 DIAGNOSIS — Z1211 Encounter for screening for malignant neoplasm of colon: Secondary | ICD-10-CM | POA: Insufficient documentation

## 2020-11-12 NOTE — Progress Notes (Signed)
New Patient Office Visit  Subjective:  Patient ID: Kathryn [name], female    DOB: [birth year]  Age: 60 y.o. MRN: [id number]  CC:  Chief Complaint  Patient presents with   Breast Cancer     HPI Kathryn [name] presents for history of breast cancer - she was diagnosed in 2020 and had treatment then had recurrence in 02/2020 - she has had a double mastectomy 04/2020 Continues to follow with Dr Jana Hakim Here to establish and voices no problems or concerns today  Does request to have colonoscopy  Past Medical History:  Diagnosis Date   Anemia    during chemo   Breast cancer ([place])    stage 3 - left   Family history of kidney cancer    Family history of melanoma    GERD (gastroesophageal reflux disease)    resolved   Headache    hormone related, none since 1st child was born    Personal history of chemotherapy    Personal history of radiation therapy    Pleurisy     Past Surgical History:  Procedure Laterality Date   AXILLARY LYMPH NODE DISSECTION Left 10/11/2018   Procedure: LEFT AXILLARY LYMPH NODE DISSECTION;  Surgeon: Erroll Luna, MD;  Location: [facility];  Service: General;  Laterality: Left;   BREAST LUMPECTOMY Left 08/2018   BREAST LUMPECTOMY WITH RADIOACTIVE SEED AND SENTINEL LYMPH NODE BIOPSY Left 09/23/2018   Procedure: LEFT BREAST LUMPECTOMY WITH RADIOACTIVE SEED AND LEFT AXILLARY TARGETED LYMPH NODE BIOPY AND  LEFT AXILLARY SENTINEL LYMPH NODE MAPPING;  Surgeon: Erroll Luna, MD;  Location: [facility];  Service: General;  Laterality: Left;   CESAREAN SECTION     x3   FRACTURE SURGERY  2020   humerus - w/ Metta Clines. Supple, MD   ORIF HUMERUS FRACTURE Right 01/31/2019   Procedure: OPEN REDUCTION INTERNAL FIXATION (ORIF) Right 3 part proximal humerus fracture;  Surgeon: Justice Britain, MD;  [facility];  Service: Orthopedics;  Laterality: Right;  174min   PORT-A-CATH REMOVAL Right 07/18/2019   Procedure: REMOVAL PORT-A-CATH;  Surgeon: Erroll Luna,  MD;  Location: [facility];  Service: General;  Laterality: Right;   PORTACATH PLACEMENT N/A 04/28/2018   Procedure: INSERTION PORT-A-CATH WITH ULTRASOUND;  Surgeon: Erroll Luna, MD;  Location: [facility];  Service: General;  Laterality: N/A;   PORTACATH PLACEMENT N/A 04/25/2020   Procedure: INSERTION PORT-A-CATH;  Surgeon: Erroll Luna, MD;  [facility];  Service: General;  Laterality: N/A;  5 MIN IN LDOW PLEASE   SENTINEL NODE BIOPSY Left 07/30/2020   Procedure: LEFT SENTINEL NODE BIOPSY;  Surgeon: Erroll Luna, MD;  Location: [facility];  Service: General;  Laterality: Left;   SIMPLE MASTECTOMY WITH AXILLARY SENTINEL NODE BIOPSY Bilateral 07/30/2020   Procedure: BILATERAL SIMPLE MASTECTOMY;  Surgeon: Erroll Luna, MD;  Location: [facility];  Service: General;  Laterality: Bilateral;   TUBAL LIGATION      Family History  Problem Relation Age of Onset   Kidney cancer Sister 98       d. 20   Lung cancer Paternal Uncle    Melanoma Sister    Melanoma Sister     Social History   Socioeconomic History   Marital status: Married    Spouse name: Not on file   Number of children: Not on file   Years of education: Not on file   Highest education level: Not on file  Occupational History   Not on file  Tobacco  Use   Smoking status: Never   Smokeless tobacco: Never  Vaping Use   Vaping Use: Never used  Substance and Sexual Activity   Alcohol use: Not Currently    Comment: rare   Drug use: Never   Sexual activity: Not Currently    Birth control/protection: Post-menopausal  Other Topics Concern   Not on file  Social History Narrative   Not on file   Social Determinants of Health   Financial Resource Strain: Not on file  Food Insecurity: Not on file  Transportation Needs: Not on file  Physical Activity: Not on file  Stress: Not on file  Social Connections: Not on file  Intimate Partner Violence: Not on file     Current Outpatient Medications:    cholecalciferol  (VITAMIN D3) 25 MCG (1000 UNIT) tablet, Take 1,000 Units by mouth in the morning., Disp:  , Rfl:    loratadine (CLARITIN) 10 MG tablet, Take 1 tablet (10 mg total) by mouth in the morning., Disp: 90 tablet, Rfl: 0   No Known Allergies  ROS CONSTITUTIONAL: Negative for chills, fatigue, fever, unintentional weight gain and unintentional weight loss.  E/N/T: Negative for ear pain, nasal congestion and sore throat.  CARDIOVASCULAR: Negative for chest pain, dizziness, palpitations and pedal edema.  RESPIRATORY: Negative for recent cough and dyspnea.  GASTROINTESTINAL: Negative for abdominal pain, acid reflux symptoms, constipation, diarrhea, nausea and vomiting.  MSK: Negative for arthralgias and myalgias.  INTEGUMENTARY: Negative for rash.         Objective:    PHYSICAL EXAM:   VS: BP 110/70 (BP Location: Right Arm, Patient Position: Sitting, Cuff Size: Normal)   Pulse 98   Temp 97.6 F (36.4 C) (Temporal)   Ht 5\' 4"  (1.626 m)   Wt 158 lb 6.4 oz (71.8 kg)   SpO2 98%   BMI 27.19 kg/m   GEN: Well nourished, well developed, in no acute distress   Cardiac: RRR; no murmurs, rubs, or gallops,no edema -  Respiratory:  normal respiratory rate and pattern with no distress - normal breath sounds with no rales, rhonchi, wheezes or rubs  Skin: warm and dry, no rash   Psych: euthymic mood, appropriate affect and demeanor  BP 110/70 (BP Location: Right Arm, Patient Position: Sitting, Cuff Size: Normal)   Pulse 98   Temp 97.6 F (36.4 C) (Temporal)   Ht 5\' 4"  (1.779 m)   Wt 158 lb 6.4 oz (71.8 kg)   SpO2 98%   BMI 27.19 kg/m  Wt Readings from Last 3 Encounters:  11/12/20 158 lb 6.4 oz (71.8 kg)  08/08/20 159 lb 1.6 oz (72.2 kg)  07/30/20 162 lb (73.5 kg)     Health Maintenance Due  Topic Date Due   HIV Screening  Never done   Hepatitis C Screening  Never done   PAP SMEAR-Modifier  Never done   COLONOSCOPY (Pts 45-41yrs Insurance coverage will need to be confirmed)  Never  done    There are no preventive care reminders to display for this patient.  No results found for: TSH Lab Results  Component Value Date   WBC 3.6 (L) 10/10/2020   HGB 11.2 (L) 10/10/2020   HCT 34.1 (L) 10/10/2020   MCV 92.4 10/10/2020   PLT 203 10/10/2020   Lab Results  Component Value Date   NA 138 10/10/2020   K 4.0 10/10/2020   CO2 25 10/10/2020   GLUCOSE 93 10/10/2020   BUN 14 10/10/2020   CREATININE 0.69 10/10/2020  BILITOT 0.4 10/10/2020   ALKPHOS 77 10/10/2020   AST 17 10/10/2020   ALT 15 10/10/2020   PROT 6.9 10/10/2020   ALBUMIN 3.7 10/10/2020   CALCIUM 9.1 10/10/2020   ANIONGAP 7 10/10/2020   No results found for: CHOL No results found for: HDL No results found for: LDLCALC No results found for: TRIG No results found for: CHOLHDL No results found for: HGBA1C    Assessment & Plan:   Problem List Items Addressed This Visit       Other   Breast cancer, stage 2, left ([place]) - Primary Continue follow up with oncology    Colon cancer screening Referral to GI for colonoscopy    No orders of the defined types were placed in this encounter.   Follow-up: Return in about 3 months (around 02/11/2021) for fasting physical.    SARA R Taim Wurm, PA-C

## 2020-12-05 ENCOUNTER — Inpatient Hospital Stay: Payer: BC Managed Care – PPO | Attending: Oncology

## 2020-12-05 ENCOUNTER — Inpatient Hospital Stay (HOSPITAL_BASED_OUTPATIENT_CLINIC_OR_DEPARTMENT_OTHER): Payer: BC Managed Care – PPO | Admitting: Oncology

## 2020-12-05 ENCOUNTER — Other Ambulatory Visit: Payer: Self-pay

## 2020-12-05 ENCOUNTER — Inpatient Hospital Stay: Payer: BC Managed Care – PPO

## 2020-12-05 ENCOUNTER — Encounter: Payer: Self-pay | Admitting: Oncology

## 2020-12-05 VITALS — HR 88

## 2020-12-05 VITALS — BP 140/82 | HR 104 | Temp 97.7°F | Resp 18 | Ht 64.0 in | Wt 157.2 lb

## 2020-12-05 DIAGNOSIS — C50912 Malignant neoplasm of unspecified site of left female breast: Secondary | ICD-10-CM | POA: Diagnosis not present

## 2020-12-05 DIAGNOSIS — Z171 Estrogen receptor negative status [ER-]: Secondary | ICD-10-CM | POA: Insufficient documentation

## 2020-12-05 DIAGNOSIS — C773 Secondary and unspecified malignant neoplasm of axilla and upper limb lymph nodes: Secondary | ICD-10-CM | POA: Insufficient documentation

## 2020-12-05 DIAGNOSIS — C50412 Malignant neoplasm of upper-outer quadrant of left female breast: Secondary | ICD-10-CM | POA: Insufficient documentation

## 2020-12-05 DIAGNOSIS — Z95828 Presence of other vascular implants and grafts: Secondary | ICD-10-CM

## 2020-12-05 LAB — CMP (CANCER CENTER ONLY)
ALT: 14 U/L (ref 0–44)
AST: 21 U/L (ref 15–41)
Albumin: 4.5 g/dL (ref 3.5–5.0)
Alkaline Phosphatase: 77 U/L (ref 38–126)
Anion gap: 9 (ref 5–15)
BUN: 13 mg/dL (ref 6–20)
CO2: 26 mmol/L (ref 22–32)
Calcium: 9.3 mg/dL (ref 8.9–10.3)
Chloride: 103 mmol/L (ref 98–111)
Creatinine: 0.67 mg/dL (ref 0.44–1.00)
GFR, Estimated: >60 mL/min (ref >60)
Glucose, Bld: 105 mg/dL — ABNORMAL HIGH (ref 70–99)
Potassium: 4.1 mmol/L (ref 3.5–5.1)
Sodium: 138 mmol/L (ref 135–145)
Total Bilirubin: 0.4 mg/dL (ref 0.3–1.2)
Total Protein: 7.7 g/dL (ref 6.5–8.1)

## 2020-12-05 LAB — CBC WITH DIFFERENTIAL/PLATELET
Abs Immature Granulocytes: 0.01 10*3/uL (ref 0.00–0.07)
Basophils Absolute: 0 10*3/uL (ref 0.0–0.1)
Basophils Relative: 1 %
Eosinophils Absolute: 0.1 10*3/uL (ref 0.0–0.5)
Eosinophils Relative: 2 %
HCT: 38.6 % (ref 36.0–46.0)
Hemoglobin: 12.5 g/dL (ref 12.0–15.0)
Immature Granulocytes: 0 %
Lymphocytes Relative: 36 %
Lymphs Abs: 1.7 10*3/uL (ref 0.7–4.0)
MCH: 29.7 pg (ref 26.0–34.0)
MCHC: 32.4 g/dL (ref 30.0–36.0)
MCV: 91.7 fL (ref 80.0–100.0)
Monocytes Absolute: 0.3 10*3/uL (ref 0.1–1.0)
Monocytes Relative: 6 %
Neutro Abs: 2.6 10*3/uL (ref 1.7–7.7)
Neutrophils Relative %: 55 %
Platelets: 229 10*3/uL (ref 150–400)
RBC: 4.21 MIL/uL (ref 3.87–5.11)
RDW: 14.7 % (ref 11.5–15.5)
WBC: 4.7 10*3/uL (ref 4.0–10.5)
nRBC: 0 % (ref 0.0–0.2)

## 2020-12-05 MED ORDER — GABAPENTIN 100 MG PO CAPS: 100.0000 mg | ORAL_CAPSULE | Freq: Every day | ORAL | 4 refills | Status: DC

## 2020-12-05 MED ORDER — SODIUM CHLORIDE 0.9 % IV SOLN: Freq: Once | INTRAVENOUS | Status: AC

## 2020-12-05 MED ORDER — ZOLEDRONIC ACID 4 MG/100ML IV SOLN
4.0000 mg | Freq: Once | INTRAVENOUS | Status: AC
  Administered 2020-12-05: 4 mg via INTRAVENOUS
  Filled 2020-12-05: qty 100
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
## 2020-12-05 NOTE — Patient Instructions (Signed)

## 2020-12-06 ENCOUNTER — Telehealth: Payer: Self-pay | Admitting: Hematology and Oncology

## 2020-12-06 ENCOUNTER — Ambulatory Visit: Payer: BC Managed Care – PPO | Attending: Surgery | Admitting: Physical Therapy

## 2020-12-06 ENCOUNTER — Encounter: Payer: Self-pay | Admitting: Physical Therapy

## 2020-12-06 DIAGNOSIS — Z483 Aftercare following surgery for neoplasm: Secondary | ICD-10-CM

## 2020-12-06 DIAGNOSIS — M6281 Muscle weakness (generalized): Secondary | ICD-10-CM

## 2020-12-06 DIAGNOSIS — M25511 Pain in right shoulder: Secondary | ICD-10-CM | POA: Diagnosis not present

## 2020-12-06 DIAGNOSIS — R293 Abnormal posture: Secondary | ICD-10-CM

## 2020-12-06 DIAGNOSIS — M25611 Stiffness of right shoulder, not elsewhere classified: Secondary | ICD-10-CM

## 2020-12-06 DIAGNOSIS — I972 Postmastectomy lymphedema syndrome: Secondary | ICD-10-CM | POA: Diagnosis not present

## 2020-12-06 DIAGNOSIS — C50912 Malignant neoplasm of unspecified site of left female breast: Secondary | ICD-10-CM | POA: Insufficient documentation

## 2020-12-06 DIAGNOSIS — Z171 Estrogen receptor negative status [ER-]: Secondary | ICD-10-CM

## 2020-12-06 DIAGNOSIS — M25612 Stiffness of left shoulder, not elsewhere classified: Secondary | ICD-10-CM | POA: Diagnosis not present

## 2020-12-06 DIAGNOSIS — C50412 Malignant neoplasm of upper-outer quadrant of left female breast: Secondary | ICD-10-CM | POA: Diagnosis not present

## 2020-12-06 NOTE — Telephone Encounter (Signed)
Scheduled per 10/14 in basket, message was left with pt

## 2020-12-06 NOTE — Therapy (Signed)
[facility] @ [place], [place], [address] Phone: [phone]   Fax:  [phone]  Physical Therapy Re-Evaluation  Patient Details  Name: Kathryn [name] MRN: [id number] Date of Birth: March 17, 1960 Referring Provider (PT): Dr. Brantley Stage   Encounter Date: 12/06/2020    Past Medical History:  Diagnosis Date   Anemia    during chemo   Breast cancer ([place])    stage 3 - left   Family history of kidney cancer    Family history of melanoma    GERD (gastroesophageal reflux disease)    resolved   Headache    hormone related, none since 1st child was born    Personal history of chemotherapy    Personal history of radiation therapy    Pleurisy     Past Surgical History:  Procedure Laterality Date   AXILLARY LYMPH NODE DISSECTION Left 10/11/2018   Procedure: LEFT AXILLARY LYMPH NODE DISSECTION;  Surgeon: Erroll Luna, MD;  Location: [facility];  Service: General;  Laterality: Left;   BREAST LUMPECTOMY Left 08/2018   BREAST LUMPECTOMY WITH RADIOACTIVE SEED AND SENTINEL LYMPH NODE BIOPSY Left 09/23/2018   Procedure: LEFT BREAST LUMPECTOMY WITH RADIOACTIVE SEED AND LEFT AXILLARY TARGETED LYMPH NODE BIOPY AND  LEFT AXILLARY SENTINEL LYMPH NODE [place];  Surgeon: Erroll Luna, MD;  Location: [facility];  Service: General;  Laterality: Left;   CESAREAN SECTION     x3   FRACTURE SURGERY  2020   humerus - w/ Metta Clines. Supple, MD   ORIF HUMERUS FRACTURE Right 01/31/2019   Procedure: OPEN REDUCTION INTERNAL FIXATION (ORIF) Right 3 part proximal humerus fracture;  Surgeon: Justice Britain, MD;  [facility];  Service: Orthopedics;  Laterality: Right;  145mn   PORT-A-CATH REMOVAL Right 07/18/2019   Procedure: REMOVAL PORT-A-CATH;  Surgeon: CErroll Luna MD;  [facility]  Service: General;  Laterality: Right;   PORTACATH PLACEMENT N/A 04/28/2018   Procedure: INSERTION PORT-A-CATH WITH  ULTRASOUND;  Surgeon: CErroll Luna MD;  [facility]  Service: General;  Laterality: N/A;   PORTACATH PLACEMENT N/A 04/25/2020   Procedure: INSERTION PORT-A-CATH;  Surgeon: CErroll Luna MD;  [facility];  Service: General;  Laterality: N/A;  60 MIN IN LDOW PLEASE   SENTINEL NODE BIOPSY Left 07/30/2020   Procedure: LEFT SENTINEL NODE BIOPSY;  Surgeon: CErroll Luna MD;  [facility]  Service: General;  Laterality: Left;   SIMPLE MASTECTOMY WITH AXILLARY SENTINEL NODE BIOPSY Bilateral 07/30/2020   Procedure: BILATERAL SIMPLE MASTECTOMY;  Surgeon: CErroll Luna MD;  [facility]  Service: General;  Laterality: Bilateral;   TUBAL LIGATION      There were no vitals filed for this visit.    Subjective Assessment - 12/06/20 0906     Subjective Pt  has had 2 rounds of antibiotics and feels that her cellulitis is improved. She feels her shoulder is improved but she still has tightness in her axilla when she reached up overhead. She is back to work and using her arm alot at work.She occasionally has pain in her neck and swelling in her armpit  Her husband has been having some difficulty with his foot and has to have surgery.    Pertinent History Neoadjuvant chemotherapy getting through about 50% before having to stop, [facility]  1/1 lymph nodes positive followed by ALND on 10/11/18 with all 7 nodes negative.  Completed radiation  ER/PR/HER2 negative  Pt had fracture of proximal humerus with ORIF on 01/31/2019. " When it came back in February, I had 4 rounds of chemo and then bilateral mastectomy"  on 6/7 /2022 She did not have reconstructive surgery    Patient Stated Goals to come back for rehab post mastectomy    Currently in Pain? Yes    Pain Score 5     Pain Location Shoulder    Pain Orientation Left    Pain Descriptors / Indicators Aching    Pain Type Chronic pain    Pain Radiating Towards to neck  and down arm    Pain Onset More than a  month ago    Pain Frequency Intermittent   gets better and worse   Aggravating Factors  worse at the end of the day    Effect of Pain on Daily Activities limited in reaching up                [facility] PT Assessment - 12/06/20 0001       Assessment   Medical Diagnosis post bilateral mastectomy    Referring Provider (PT) Dr. Brantley Stage    Onset Date/Surgical Date 09/23/18   ORIF of right humerus 01/31/2019, bilaterally mastectomy on 07/30/2020   Hand Dominance Right      Precautions   Precaution Comments left breast cancer with ALND and radiation  now bilateral mastectomy      Cognition   Overall Cognitive Status Within Functional Limits for tasks assessed      Observation/Other Assessments   Observations pt with visible fulliness in left arm. She has she not been wearing her circular knit compression sleeve due to poor fit Pt with well healed incisions on bilateral chest with 2 small scabbed areas(adherent glue??)  still present.    Other Surveys  Quick Dash    Quick DASH  31.82      Coordination   Gross Motor Movements are Fluid and Coordinated Yes      Posture/Postural Control   Posture/Postural Control Postural limitations    Postural Limitations Forward head;Rounded Shoulders;Increased thoracic kyphosis      ROM / Strength   AROM / PROM / Strength AROM      AROM   Overall AROM  Deficits    AROM Assessment Site Shoulder    Right/Left Shoulder Right;Left    Right Shoulder Flexion 165 Degrees    Right Shoulder ABduction 165 Degrees    Left Shoulder Flexion 135 Degrees    Left Shoulder ABduction 145 Degrees      Strength   Overall Strength Deficits    Overall Strength Comments bilateral shoulder strength about 3-/5 with pt unable to reach full available range without prompting, but she states she is doing most things with her arms      Palpation   Palpation comment decreased skin excursion at left chest wall near incision. Lymphostatic fibrosis in left upper arm and  especilly forearm with mild pitting present               LYMPHEDEMA/ONCOLOGY QUESTIONNAIRE - 12/06/20 0001       Type   Cancer Type Left breast cancer      Surgeries   Mastectomy Date 07/30/20    Lumpectomy Date 09/23/18    Sentinel Lymph Node Biopsy Date 09/23/18    Other Surgery Date 10/13/18   right shoulder ORIF 01/31/2019   Number Lymph Nodes Removed 8   1 positive     Treatment  Active Chemotherapy Treatment No    Past Chemotherapy Treatment Yes    Active Radiation Treatment No    Past Radiation Treatment Yes    Body Site left upper quadrant    Current Hormone Treatment No    Past Hormone Therapy No      What other symptoms do you have   Are you Having Heaviness or Tightness Yes    Are you having Pain Yes    Are you having pitting edema Yes    Body Site forearm and distal upper arm on the left    Is it Hard or Difficult finding clothes that fit Yes    Do you have infections Yes    Comments 2 recent rounds of antibiotics    Is there Decreased scar mobility Yes   left chest wall   Stemmer Sign No    Other Symptoms increased in forearm since 09/2020      Lymphedema Stage   Stage STAGE 2 SPONTANEOUSLY IRREVERSIBLE      Left Upper Extremity Lymphedema   15 cm Proximal to Olecranon Process 32.4 cm    10 cm Proximal to Olecranon Process 34.5 cm    Olecranon Process 29 cm    15 cm Proximal to Ulnar Styloid Process 27.5 cm    10 cm Proximal to Ulnar Styloid Process 24.5 cm    Just Proximal to Ulnar Styloid Process 16 cm    Across Hand at [employer] 18 cm    At [place] of 2nd Digit 5.9 cm                   Quick Dash - 12/06/20 0001     Open a tight or new jar Moderate difficulty    Do heavy household chores (wash walls, wash floors) Mild difficulty    Carry a shopping bag or briefcase Mild difficulty    Wash your back Moderate difficulty    Use a knife to cut food Mild difficulty    Recreational activities in which you take some force or impact  through your arm, shoulder, or hand (golf, hammering, tennis) Mild difficulty    During the past week, to what extent has your arm, shoulder or hand problem interfered with your normal social activities with family, friends, neighbors, or groups? Not at all    During the past week, to what extent has your arm, shoulder or hand problem limited your work or other regular daily activities Modererately    Arm, shoulder, or hand pain. Moderate    Tingling (pins and needles) in your arm, shoulder, or hand Mild    Difficulty Sleeping Mild difficulty    DASH Score 31.82 %              Objective measurements completed on examination: See above findings.       [facility] Adult PT Treatment/Exercise - 12/06/20 0001       Self-Care   Other Self-Care Comments  encouraged pt to continue home exercise program and explained process of long term managment of lymphedema at home with Flexitouch, day and night compression garments                       PT Short Term Goals - 12/06/20 1012       PT SHORT TERM GOAL #1   Title Pt will be independent in beginning home exercise program    Time 4    Period Weeks    Status On-going  PT SHORT TERM GOAL #2   Title Pt / family will be independent in manual lymph drainage and compression bandaging for left arm lymphedema    Time 4    Period Weeks    Status On-going               PT Long Term Goals - 12/06/20 1013       PT LONG TERM GOAL #1   Title Pt will have compression garments and possibly Flexitouch if needed to continue with long term management of lymphedema at home    Time 8    Period Weeks    Status On-going      PT LONG TERM GOAL #2   Title Pt will independent in a gradually progressive resisitve exercise program that she can continue in the community    Time 8    Period Weeks    Status On-going      PT LONG TERM GOAL #3   Title Pt will have reduction in circumference of left arm adn 10 cm proximal to olecranon  by 3 cm    Baseline 35 cm on 10/11/2020. 35.5 on 12/06/2020    Time 8    Period Weeks    Status On-going      PT LONG TERM GOAL #4   Title Pt will decrease QDASH to < 10    Baseline 13.64 on 10/11/2020, 31.82 on 12/07/2020    Time 8    Period Weeks    Status On-going                    Plan - 12/06/20 0948     Clinical Impression Statement Pt returns to PT after 2 rounds of antibiotics for treatment of cellulitis in her arm Her chest incisions are all healed with 2 small areas of adherent scabbing. She is having tightnening of the scar on her chest wall with pulling and tightness in her axilla with overhead reaching. She has persistent fullness in her left arm with increased lymphostatic fibrosis with pitting and hyperplasia in her forearm with increased circumferential measurements of .5 cm since August. She has been doing conservative meaures at home with self MLD, use of compression and exercise,but lymphedema has been persistent. She needs more aggressive measures with reinforcement of self MLD, Flexitouch compresion pump with abdominal  and chest component for better self managment at home as her lymphedema will likely be a chronic condition, compression bandaging with instruction to patient and family, flat knit daytime and nighttime compression garments as well as progression of exercise program.  Pt is agreeable to treatment plan as outlined .Demographics sent to [facility]    Personal Factors and Comorbidities Comorbidity 3+    Comorbidities lymph node removal, radiation, bilateral mastectomy persistent lymphedema    Examination-Activity Limitations Reach Overhead;Dressing;Hygiene/Grooming    Examination-Participation Restrictions Yard Work;Cleaning;Laundry;Meal Prep    Stability/Clinical Decision Making Evolving/Moderate complexity    Clinical Decision Making Moderate    Rehab Potential Good    PT Frequency 3x / week    PT Duration 8 weeks    PT  Treatment/Interventions ADLs/Self Care Home Management;Manual lymph drainage;Patient/family education;Therapeutic exercise;Therapeutic activities;Passive range of motion;Scar mobilization;Manual techniques;Neuromuscular re-education;Taping;Orthotic Fit/Training;Compression bandaging    PT Next Visit Plan Begin complete decongestive therap andt each family MLD and bandaging Order flat knit garments when reduced. Teach and progress strengthening exercise as pt wants to go to [employer]  ?? SOZO monitoring??  Patient will benefit from skilled therapeutic intervention in order to improve the following deficits and impairments:  Decreased mobility, Impaired perceived functional ability, Decreased range of motion, Decreased activity tolerance, Decreased coordination, Decreased knowledge of use of DME, Decreased strength, Increased fascial restricitons, Impaired flexibility, Impaired UE functional use, Postural dysfunction, Pain, Decreased scar mobility, Increased edema, Increased muscle spasms  Visit Diagnosis: Aftercare following surgery for neoplasm - Plan: PT plan of care cert/re-cert  Abnormal posture - Plan: PT plan of care cert/re-cert  Postmastectomy lymphedema - Plan: PT plan of care cert/re-cert  Stiffness of right shoulder, not elsewhere classified - Plan: PT plan of care cert/re-cert  Stiffness of left shoulder, not elsewhere classified - Plan: PT plan of care cert/re-cert  Malignant neoplasm of upper-outer quadrant of left breast in female, estrogen receptor negative ([place]) - Plan: PT plan of care cert/re-cert  Muscle weakness (generalized) - Plan: PT plan of care cert/re-cert  Acute pain of right shoulder - Plan: PT plan of care cert/re-cert     Problem List Patient Active Problem List   Diagnosis Date Noted   Colon cancer screening 11/12/2020   Family history of kidney cancer 10/11/2020   Recurrent breast cancer, left ([place]) 07/30/2020    Osteoporosis 04/16/2020   Closed fracture of right proximal humerus 01/23/2019   Port-A-Cath in place 05/17/2018   Genetic testing 05/10/2018   Family history of melanoma    Malignant neoplasm of upper-outer quadrant of left breast in female, estrogen receptor negative ([place]) 04/11/2018   Chest pain 05/13/2015   Shortness of breath 05/13/2015    Norwood Levo, PT 12/06/2020, 10:21 AM  [facility] @ [place], [place], [address] Phone: [phone]   Fax:  [phone]  Name: Kathryn [name] MRN: [id number] Date of Birth: [birth year]

## 2020-12-06 NOTE — Patient Instructions (Signed)

## 2020-12-09 ENCOUNTER — Encounter: Payer: Self-pay | Admitting: Physical Therapy

## 2020-12-09 ENCOUNTER — Other Ambulatory Visit: Payer: Self-pay

## 2020-12-09 ENCOUNTER — Ambulatory Visit: Payer: BC Managed Care – PPO | Admitting: Physical Therapy

## 2020-12-09 DIAGNOSIS — Z171 Estrogen receptor negative status [ER-]: Secondary | ICD-10-CM

## 2020-12-09 DIAGNOSIS — Z483 Aftercare following surgery for neoplasm: Secondary | ICD-10-CM

## 2020-12-09 DIAGNOSIS — C50912 Malignant neoplasm of unspecified site of left female breast: Secondary | ICD-10-CM | POA: Diagnosis not present

## 2020-12-09 DIAGNOSIS — M6281 Muscle weakness (generalized): Secondary | ICD-10-CM | POA: Diagnosis not present

## 2020-12-09 DIAGNOSIS — C50412 Malignant neoplasm of upper-outer quadrant of left female breast: Secondary | ICD-10-CM

## 2020-12-09 DIAGNOSIS — I972 Postmastectomy lymphedema syndrome: Secondary | ICD-10-CM | POA: Diagnosis not present

## 2020-12-09 DIAGNOSIS — M25612 Stiffness of left shoulder, not elsewhere classified: Secondary | ICD-10-CM | POA: Diagnosis not present

## 2020-12-09 DIAGNOSIS — M25611 Stiffness of right shoulder, not elsewhere classified: Secondary | ICD-10-CM | POA: Diagnosis not present

## 2020-12-09 DIAGNOSIS — R293 Abnormal posture: Secondary | ICD-10-CM

## 2020-12-09 DIAGNOSIS — M25511 Pain in right shoulder: Secondary | ICD-10-CM | POA: Diagnosis not present

## 2020-12-09 NOTE — Therapy (Signed)
[facility] @ [place], [place], [address] Phone: [phone]   Fax:  [phone]  Physical Therapy Treatment  Patient Details  Name: Kathryn [name] MRN: [id number] Date of Birth: 1960-10-23 Referring Provider (PT): Dr. Brantley Stage   Encounter Date: 12/09/2020   PT End of Session - 12/09/20 1506     Visit Number 3    Number of Visits 24    Date for PT Re-Evaluation 02/06/21    PT Start Time 1408    PT Stop Time 1062    PT Time Calculation (min) 51 min    Activity Tolerance Patient tolerated treatment well    Behavior During Therapy [facility] for tasks assessed/performed             Past Medical History:  Diagnosis Date   Anemia    during chemo   Breast cancer ([place])    stage 3 - left   Family history of kidney cancer    Family history of melanoma    GERD (gastroesophageal reflux disease)    resolved   Headache    hormone related, none since 1st child was born    Personal history of chemotherapy    Personal history of radiation therapy    Pleurisy     Past Surgical History:  Procedure Laterality Date   AXILLARY LYMPH NODE DISSECTION Left 10/11/2018   Procedure: LEFT AXILLARY LYMPH NODE DISSECTION;  Surgeon: Erroll Luna, MD;  Location: [facility];  Service: General;  Laterality: Left;   BREAST LUMPECTOMY Left 08/2018   BREAST LUMPECTOMY WITH RADIOACTIVE SEED AND SENTINEL LYMPH NODE BIOPSY Left 09/23/2018   Procedure: LEFT BREAST LUMPECTOMY WITH RADIOACTIVE SEED AND LEFT AXILLARY TARGETED LYMPH NODE BI[facility] AND  LEFT AXILLARY SENTINEL LYMPH NODE [place];  Surgeon: Erroll Luna, MD;  Location: [facility];  Service: General;  Laterality: Left;   CESAREAN SECTION     x3   FRACTURE SURGERY  2020   humerus - w/ Metta Clines. Supple, MD   ORIF HUMERUS FRACTURE Right 01/31/2019   Procedure: OPEN REDUCTION INTERNAL FIXATION (ORIF) Right 3 part proximal humerus fracture;  Surgeon: Justice Britain, MD;   [facility];  Service: Orthopedics;  Laterality: Right;  125mn   PORT-A-CATH REMOVAL Right 07/18/2019   Procedure: REMOVAL PORT-A-CATH;  Surgeon: CErroll Luna MD;  [facility]  Service: General;  Laterality: Right;   PORTACATH PLACEMENT N/A 04/28/2018   Procedure: INSERTION PORT-A-CATH WITH ULTRASOUND;  Surgeon: CErroll Luna MD;  [facility]  Service: General;  Laterality: N/A;   PORTACATH PLACEMENT N/A 04/25/2020   Procedure: INSERTION PORT-A-CATH;  Surgeon: CErroll Luna MD;  [facility];  Service: General;  Laterality: N/A;  60 MIN IN LDOW PLEASE   SENTINEL NODE BIOPSY Left 07/30/2020   Procedure: LEFT SENTINEL NODE BIOPSY;  Surgeon: CErroll Luna MD;  [facility]  Service: General;  Laterality: Left;   SIMPLE MASTECTOMY WITH AXILLARY SENTINEL NODE BIOPSY Bilateral 07/30/2020   Procedure: BILATERAL SIMPLE MASTECTOMY;  Surgeon: CErroll Luna MD;  [facility]  Service: General;  Laterality: Bilateral;   TUBAL LIGATION      There were no vitals filed for this visit.   Subjective Assessment - 12/09/20 1409     Subjective The cellulitis is improved. They are trying to work me in 3x/wk.    Pertinent History Neoadjuvant chemotherapy getting through about 50% before having to stop, s/[facility].  1/1 lymph nodes positive followed by ALND on 10/11/18 with all 7 nodes negative.  Completed radiation  ER/PR/HER2 negative  Pt had fracture of proximal humerus with ORIF on 01/31/2019. " When it came back in February, I had 4 rounds of chemo and then bilateral mastectomy"  on 6/7 /2022 She did not have reconstructive surgery    Patient Stated Goals to come back for rehab post mastectomy    Currently in Pain? Yes    Pain Score 4     Pain Location Shoulder    Pain Orientation Left    Pain Descriptors / Indicators Aching    Pain Type Chronic pain    Pain Onset More than a month ago    Pain Frequency Intermittent     Aggravating Factors  using the shoulder a lot, repetitive motion    Pain Relieving Factors nothing    Effect of Pain on Daily Activities doesn't really effect them                               [facility] Adult PT Treatment/Exercise - 12/09/20 0001       Manual Therapy   Manual Therapy Manual Lymphatic Drainage (MLD);Compression Bandaging    Manual Lymphatic Drainage (MLD) short neck, 5 diaphragmatic breaths, right axillary nodes and establishment of interaxillary pathway, left inguinal nodes and establishment of axillo inguinal pathway, LUE working proximal to distal then retracing all steps    Compression Bandaging thick stockinette, elastomull to digits 1-5 with paper tape on top, artiflex from wrist to axilla. 1 6 cm bandage at hand, 1 8 cm from wrist to upper arm, 1 12 cm bandage from wrist to axilla - educated pt to remove bandages if she has increased discomfort that doesn't go away when she moves or if bandages slide down                       PT Short Term Goals - 12/06/20 1012       PT SHORT TERM GOAL #1   Title Pt will be independent in beginning home exercise program    Time 4    Period Weeks    Status On-going      PT SHORT TERM GOAL #2   Title Pt / family will be independent in manual lymph drainage and compression bandaging for left arm lymphedema    Time 4    Period Weeks    Status On-going               PT Long Term Goals - 12/06/20 1013       PT LONG TERM GOAL #1   Title Pt will have compression garments and possibly Flexitouch if needed to continue with long term management of lymphedema at home    Time 8    Period Weeks    Status On-going      PT LONG TERM GOAL #2   Title Pt will independent in a gradually progressive resisitve exercise program that she can continue in the community    Time 8    Period Weeks    Status On-going      PT LONG TERM GOAL #3   Title Pt will have reduction in circumference of left arm adn  10 cm proximal to olecranon by 3 cm    Baseline 35 cm on 10/11/2020. 35.5 on 12/06/2020    Time 8  Period Weeks    Status On-going      PT LONG TERM GOAL #4   Title Pt will decrease QDASH to < 10    Baseline 13.64 on 10/11/2020, 31.82 on 12/07/2020    Time 8    Period Weeks    Status On-going                   Plan - 12/09/20 1604     Clinical Impression Statement Began MLD to LUE today followed by compression bandaging. Educated pt to remove bandages if they slide down or are painful and it does not improve when she moves her arm. Have not heard back from [employer] or [facility] regarding compression pump or garment coverage.    PT Frequency 3x / week    PT Duration 8 weeks    PT Treatment/Interventions ADLs/Self Care Home Management;Manual lymph drainage;Patient/family education;Therapeutic exercise;Therapeutic activities;Passive range of motion;Scar mobilization;Manual techniques;Neuromuscular re-education;Taping;Orthotic Fit/Training;Compression bandaging    PT Next Visit Plan Begin complete decongestive therapy including  MLD and bandaging Order flat knit garments when reduced. Teach and progress strengthening exercise as pt wants to go to [employer]  ?? SOZO monitoring??    Consulted and Agree with Plan of Care Patient             Patient will benefit from skilled therapeutic intervention in order to improve the following deficits and impairments:  Decreased mobility, Impaired perceived functional ability, Decreased range of motion, Decreased activity tolerance, Decreased coordination, Decreased knowledge of use of DME, Decreased strength, Increased fascial restricitons, Impaired flexibility, Impaired UE functional use, Postural dysfunction, Pain, Decreased scar mobility, Increased edema, Increased muscle spasms  Visit Diagnosis: Postmastectomy lymphedema  Aftercare following surgery for neoplasm  Abnormal posture  Stiffness of right shoulder, not  elsewhere classified  Stiffness of left shoulder, not elsewhere classified  Malignant neoplasm of upper-outer quadrant of left breast in female, estrogen receptor negative ([place])     Problem List Patient Active Problem List   Diagnosis Date Noted   Colon cancer screening 11/12/2020   Family history of kidney cancer 10/11/2020   Recurrent breast cancer, left ([place]) 07/30/2020   Osteoporosis 04/16/2020   Closed fracture of right proximal humerus 01/23/2019   Port-A-Cath in place 05/17/2018   Genetic testing 05/10/2018   Family history of melanoma    Malignant neoplasm of upper-outer quadrant of left breast in female, estrogen receptor negative ([place]) 04/11/2018   Chest pain 05/13/2015   Shortness of breath 05/13/2015    Allyson Sabal Jewett, PT 12/09/2020, 4:05 PM  [facility] @ [place], [place], [address] Phone: [phone]   Fax:  [phone]  Name: JAVAEH MUSCATELLO MRN: [id number] Date of Birth: [birth year]  Manus Gunning, PT 12/09/20 4:06 PM

## 2020-12-11 ENCOUNTER — Ambulatory Visit: Payer: BC Managed Care – PPO

## 2020-12-11 ENCOUNTER — Other Ambulatory Visit: Payer: Self-pay

## 2020-12-11 DIAGNOSIS — R293 Abnormal posture: Secondary | ICD-10-CM

## 2020-12-11 DIAGNOSIS — C50912 Malignant neoplasm of unspecified site of left female breast: Secondary | ICD-10-CM | POA: Diagnosis not present

## 2020-12-11 DIAGNOSIS — M25511 Pain in right shoulder: Secondary | ICD-10-CM

## 2020-12-11 DIAGNOSIS — Z171 Estrogen receptor negative status [ER-]: Secondary | ICD-10-CM | POA: Diagnosis not present

## 2020-12-11 DIAGNOSIS — M25612 Stiffness of left shoulder, not elsewhere classified: Secondary | ICD-10-CM

## 2020-12-11 DIAGNOSIS — I972 Postmastectomy lymphedema syndrome: Secondary | ICD-10-CM | POA: Diagnosis not present

## 2020-12-11 DIAGNOSIS — Z483 Aftercare following surgery for neoplasm: Secondary | ICD-10-CM | POA: Diagnosis not present

## 2020-12-11 DIAGNOSIS — M25611 Stiffness of right shoulder, not elsewhere classified: Secondary | ICD-10-CM | POA: Diagnosis not present

## 2020-12-11 DIAGNOSIS — M6281 Muscle weakness (generalized): Secondary | ICD-10-CM | POA: Diagnosis not present

## 2020-12-11 DIAGNOSIS — C50412 Malignant neoplasm of upper-outer quadrant of left female breast: Secondary | ICD-10-CM | POA: Diagnosis not present

## 2020-12-11 NOTE — Therapy (Signed)
[facility] @ [place], [place], [address] Phone: [phone]   Fax:  [phone]  Physical Therapy Treatment  Patient Details  Name: Kathryn [name] MRN: [id number] Date of Birth: [birth year] Referring Provider (PT): Dr. Brantley Stage   Encounter Date: 12/11/2020   PT End of Session - 12/11/20 1109     Visit Number 4    Number of Visits 24    Date for PT Re-Evaluation 02/06/21    PT Start Time 1007    PT Stop Time 1108    PT Time Calculation (min) 61 min    Activity Tolerance Patient tolerated treatment well    Behavior During Therapy [facility] for tasks assessed/performed             Past Medical History:  Diagnosis Date   Anemia    during chemo   Breast cancer ([place])    stage 3 - left   Family history of kidney cancer    Family history of melanoma    GERD (gastroesophageal reflux disease)    resolved   Headache    hormone related, none since 1st child was born    Personal history of chemotherapy    Personal history of radiation therapy    Pleurisy     Past Surgical History:  Procedure Laterality Date   AXILLARY LYMPH NODE DISSECTION Left 10/11/2018   Procedure: LEFT AXILLARY LYMPH NODE DISSECTION;  Surgeon: Erroll Luna, MD;  Location: [facility];  Service: General;  Laterality: Left;   BREAST LUMPECTOMY Left 08/2018   BREAST LUMPECTOMY WITH RADIOACTIVE SEED AND SENTINEL LYMPH NODE BIOPSY Left 09/23/2018   Procedure: LEFT BREAST LUMPECTOMY WITH RADIOACTIVE SEED AND LEFT AXILLARY TARGETED LYMPH NODE BIOPY AND  LEFT [place];  Surgeon: Erroll Luna, MD;  Location: [facility];  Service: General;  Laterality: Left;   CESAREAN SECTION     x3   FRACTURE SURGERY  2020   humerus - w/ Metta Clines. Supple, MD   ORIF HUMERUS FRACTURE Right 01/31/2019   Procedure: OPEN REDUCTION INTERNAL FIXATION (ORIF) Right 3 part proximal humerus fracture;  Surgeon: Justice Britain, MD;   [facility];  Service: Orthopedics;  Laterality: Right;  128mn   PORT-A-CATH REMOVAL Right 07/18/2019   Procedure: REMOVAL PORT-A-CATH;  Surgeon: CErroll Luna MD;  [facility]  Service: General;  Laterality: Right;   PORTACATH PLACEMENT N/A 04/28/2018   Procedure: INSERTION PORT-A-CATH WITH ULTRASOUND;  Surgeon: CErroll Luna MD;  [place] [facility]  Service: General;  Laterality: N/A;   PORTACATH PLACEMENT N/A 04/25/2020   Procedure: INSERTION PORT-A-CATH;  Surgeon: CErroll Luna MD;  [facility];  Service: General;  Laterality: N/A;  60 MIN IN LDOW PLEASE   SENTINEL NODE BIOPSY Left 07/30/2020   Procedure: LEFT SENTINEL NODE BIOPSY;  Surgeon: CErroll Luna MD;  [place] [facility]  Service: General;  Laterality: Left;   SIMPLE MASTECTOMY WITH AXILLARY SENTINEL NODE BIOPSY Bilateral 07/30/2020   Procedure: BILATERAL SIMPLE MASTECTOMY;  Surgeon: CErroll Luna MD;  [facility]  Service: General;  Laterality: Bilateral;   TUBAL LIGATION      There were no vitals filed for this visit.   Subjective Assessment - 12/11/20 1011     Subjective The bandaging went well except my Lt arm just a little achy. I took Aleve and that helped. I haven't heard form the pump company yet that THelene Kelptold me about (Estate agent.  Pertinent History Neoadjuvant chemotherapy getting through about 50% before having to stop, s/p lumpectomy on 09/23/18 due to [facility] and DCIS.  1/1 lymph nodes positive followed by ALND on 10/11/18 with all 7 nodes negative.  Completed radiation  ER/PR/HER2 negative  Pt had fracture of proximal humerus with ORIF on 01/31/2019. " When it came back in February, I had 4 rounds of chemo and then bilateral mastectomy"  on 6/7 /2022 She did not have reconstructive surgery    Patient Stated Goals to come back for rehab post mastectomy    Currently in Pain? No/denies                   LYMPHEDEMA/ONCOLOGY QUESTIONNAIRE - 12/11/20 0001       Left  Upper Extremity Lymphedema   15 cm Proximal to Olecranon Process 31.4 cm    10 cm Proximal to Olecranon Process 33.3 cm    Olecranon Process 28.3 cm    15 cm Proximal to Ulnar Styloid Process 26.8 cm    10 cm Proximal to Ulnar Styloid Process 24.4 cm    Just Proximal to Ulnar Styloid Process 16.6 cm    Across Hand at [employer] 17.3 cm    At [place] of 2nd Digit 5.8 cm                        [facility] Adult PT Treatment/Exercise - 12/11/20 0001       Manual Therapy   Manual Lymphatic Drainage (MLD) short neck, superficial and dep abdominals, right axillary nodes and establishment of anterior interaxillary pathway, left inguinal nodes and establishment of Lt axillo inguinal pathway, LUE working proximal to distal then retracing all steps    Compression Bandaging To Lt UE: Cocoa butter, thick stockinette, elastomull to digits 1-5 with paper tape on top, artiflex from wrist to axilla. 1 6 cm bandage at hand, 1 8 cm from wrist to upper arm, 1 12 cm bandage from wrist to axilla - reviewed remedial exercises while in bandages with pt returning demo                       PT Short Term Goals - 12/06/20 1012       PT SHORT TERM GOAL #1   Title Pt will be independent in beginning home exercise program    Time 4    Period Weeks    Status On-going      PT SHORT TERM GOAL #2   Title Pt / family will be independent in manual lymph drainage and compression bandaging for left arm lymphedema    Time 4    Period Weeks    Status On-going               PT Long Term Goals - 12/06/20 1013       PT LONG TERM GOAL #1   Title Pt will have compression garments and possibly Flexitouch if needed to continue with long term management of lymphedema at home    Time 8    Period Weeks    Status On-going      PT LONG TERM GOAL #2   Title Pt will independent in a gradually progressive resisitve exercise program that she can continue in the community    Time 8    Period  Weeks    Status On-going      PT LONG TERM GOAL #3   Title Pt will have reduction  in circumference of left arm adn 10 cm proximal to olecranon by 3 cm    Baseline 35 cm on 10/11/2020. 35.5 on 12/06/2020    Time 8    Period Weeks    Status On-going      PT LONG TERM GOAL #4   Title Pt will decrease QDASH to < 10    Baseline 13.64 on 10/11/2020, 31.82 on 12/07/2020    Time 8    Period Weeks    Status On-going                   Plan - 12/11/20 1109     Clinical Impression Statement Continued complete decongestive therapy of Lt UE. Pt did very well with tolerating bandages after first time being wrapped. She reported some mild discomfort of her Lt Ue while in bandages but took an Aleve and was fine. She did come in with bandages still on and her circumference measurements had reduced well, especially at her upper arm.    Personal Factors and Comorbidities Comorbidity 3+    Comorbidities lymph node removal, radiation, bilateral mastectomy persistent lymphedema    Examination-Activity Limitations Reach Overhead;Dressing;Hygiene/Grooming    Examination-Participation Restrictions Yard Work;Cleaning;Laundry;Meal Prep    Stability/Clinical Decision Making Evolving/Moderate complexity    Rehab Potential Good    PT Frequency 3x / week    PT Duration 8 weeks    PT Treatment/Interventions ADLs/Self Care Home Management;Manual lymph drainage;Patient/family education;Therapeutic exercise;Therapeutic activities;Passive range of motion;Scar mobilization;Manual techniques;Neuromuscular re-education;Taping;Orthotic Fit/Training;Compression bandaging    PT Next Visit Plan Cont complete decongestive therapy including  MLD and bandaging; Order flat knit garments when reduced. Teach and progress strengthening exercise as pt wants to go to [employer]; Flexitouch??    PT Home Exercise Plan Wear bandages daily, remedial exercises    Consulted and Agree with Plan of Care Patient              Patient will benefit from skilled therapeutic intervention in order to improve the following deficits and impairments:  Decreased mobility, Impaired perceived functional ability, Decreased range of motion, Decreased activity tolerance, Decreased coordination, Decreased knowledge of use of DME, Decreased strength, Increased fascial restricitons, Impaired flexibility, Impaired UE functional use, Postural dysfunction, Pain, Decreased scar mobility, Increased edema, Increased muscle spasms  Visit Diagnosis: Postmastectomy lymphedema  Aftercare following surgery for neoplasm  Abnormal posture  Stiffness of right shoulder, not elsewhere classified  Stiffness of left shoulder, not elsewhere classified  Malignant neoplasm of upper-outer quadrant of left breast in female, estrogen receptor negative (HCC)  Muscle weakness (generalized)  Acute pain of right shoulder     Problem List Patient Active Problem List   Diagnosis Date Noted   Colon cancer screening 11/12/2020   Family history of kidney cancer 10/11/2020   Recurrent breast cancer, left ([place]) 07/30/2020   Osteoporosis 04/16/2020   Closed fracture of right proximal humerus 01/23/2019   Port-A-Cath in place 05/17/2018   Genetic testing 05/10/2018   Family history of melanoma    Malignant neoplasm of upper-outer quadrant of left breast in female, estrogen receptor negative ([place]) 04/11/2018   Chest pain 05/13/2015   Shortness of breath 05/13/2015    Otelia Limes, PTA 12/11/2020, 12:58 PM  [facility] @ [place], [place], [address] Phone: [phone]   Fax:  [phone]  Name: Kathryn [name] MRN: [id number] Date of Birth: 1961-01-18

## 2020-12-12 ENCOUNTER — Inpatient Hospital Stay: Payer: BC Managed Care – PPO

## 2020-12-12 ENCOUNTER — Telehealth: Payer: Self-pay | Admitting: Hematology and Oncology

## 2020-12-12 NOTE — Telephone Encounter (Signed)
Rescheduled per 10/20 pt request

## 2020-12-13 ENCOUNTER — Ambulatory Visit: Payer: BC Managed Care – PPO | Admitting: Rehabilitation

## 2020-12-13 ENCOUNTER — Inpatient Hospital Stay: Payer: BC Managed Care – PPO

## 2020-12-13 ENCOUNTER — Encounter: Payer: Self-pay | Admitting: Rehabilitation

## 2020-12-13 ENCOUNTER — Other Ambulatory Visit: Payer: Self-pay

## 2020-12-13 DIAGNOSIS — M25511 Pain in right shoulder: Secondary | ICD-10-CM | POA: Diagnosis not present

## 2020-12-13 DIAGNOSIS — M6281 Muscle weakness (generalized): Secondary | ICD-10-CM | POA: Diagnosis not present

## 2020-12-13 DIAGNOSIS — Z171 Estrogen receptor negative status [ER-]: Secondary | ICD-10-CM | POA: Diagnosis not present

## 2020-12-13 DIAGNOSIS — C50412 Malignant neoplasm of upper-outer quadrant of left female breast: Secondary | ICD-10-CM | POA: Diagnosis not present

## 2020-12-13 DIAGNOSIS — Z483 Aftercare following surgery for neoplasm: Secondary | ICD-10-CM

## 2020-12-13 DIAGNOSIS — R293 Abnormal posture: Secondary | ICD-10-CM | POA: Diagnosis not present

## 2020-12-13 DIAGNOSIS — M25612 Stiffness of left shoulder, not elsewhere classified: Secondary | ICD-10-CM | POA: Diagnosis not present

## 2020-12-13 DIAGNOSIS — I972 Postmastectomy lymphedema syndrome: Secondary | ICD-10-CM

## 2020-12-13 DIAGNOSIS — C50912 Malignant neoplasm of unspecified site of left female breast: Secondary | ICD-10-CM | POA: Diagnosis not present

## 2020-12-13 DIAGNOSIS — M25611 Stiffness of right shoulder, not elsewhere classified: Secondary | ICD-10-CM | POA: Diagnosis not present

## 2020-12-13 NOTE — Therapy (Signed)
[facility] @ [place], [place], [address] Phone: [phone]   Fax:  [phone]  Physical Therapy Treatment  Patient Details  Name: Kathryn [name] MRN: [id number] Date of Birth: Aug 01, 1960 Referring Provider (PT): Dr. Brantley Stage   Encounter Date: 12/13/2020   PT End of Session - 12/13/20 1058     Visit Number 5    Number of Visits 24    Date for PT Re-Evaluation 02/06/21    PT Start Time 1003    PT Stop Time 1057    PT Time Calculation (min) 54 min    Activity Tolerance Patient tolerated treatment well    Behavior During Therapy [facility] for tasks assessed/performed             Past Medical History:  Diagnosis Date   Anemia    during chemo   Breast cancer ([place])    stage 3 - left   Family history of kidney cancer    Family history of melanoma    GERD (gastroesophageal reflux disease)    resolved   Headache    hormone related, none since 1st child was born    Personal history of chemotherapy    Personal history of radiation therapy    Pleurisy     Past Surgical History:  Procedure Laterality Date   AXILLARY LYMPH NODE DISSECTION Left 10/11/2018   Procedure: LEFT AXILLARY LYMPH NODE DISSECTION;  Surgeon: Erroll Luna, MD;  Location: [facility];  Service: General;  Laterality: Left;   BREAST LUMPECTOMY Left 08/2018   BREAST LUMPECTOMY WITH RADIOACTIVE SEED AND SENTINEL LYMPH NODE BIOPSY Left 09/23/2018   Procedure: LEFT BREAST LUMPECTOMY WITH RADIOACTIVE SEED AND LEFT AXILLARY TARGETED LYMPH NODE BIOPY AND  LEFT AXILLARY SENTINEL LYMPH NODE [place];  Surgeon: Erroll Luna, MD;  Location: [facility];  Service: General;  Laterality: Left;   CESAREAN SECTION     x3   FRACTURE SURGERY  2020   humerus - w/ Metta Clines. Supple, MD   ORIF HUMERUS FRACTURE Right 01/31/2019   Procedure: OPEN REDUCTION INTERNAL FIXATION (ORIF) Right 3 part proximal humerus fracture;  Surgeon: Justice Britain, MD;   [facility];  Service: Orthopedics;  Laterality: Right;  129mn   PORT-A-CATH REMOVAL Right 07/18/2019   Procedure: REMOVAL PORT-A-CATH;  Surgeon: CErroll Luna MD;  [facility]  Service: General;  Laterality: Right;   PORTACATH PLACEMENT N/A 04/28/2018   Procedure: INSERTION PORT-A-CATH WITH ULTRASOUND;  Surgeon: CErroll Luna MD;  [facility]  Service: General;  Laterality: N/A;   PORTACATH PLACEMENT N/A 04/25/2020   Procedure: INSERTION PORT-A-CATH;  Surgeon: CErroll Luna MD;  [facility];  Service: General;  Laterality: N/A;  60 MIN IN LDOW PLEASE   SENTINEL NODE BIOPSY Left 07/30/2020   Procedure: LEFT SENTINEL NODE BIOPSY;  Surgeon: CErroll Luna MD;  [place] [facility]  Service: General;  Laterality: Left;   SIMPLE MASTECTOMY WITH AXILLARY SENTINEL NODE BIOPSY Bilateral 07/30/2020   Procedure: BILATERAL SIMPLE MASTECTOMY;  Surgeon: CErroll Luna MD;  [facility]  Service: General;  Laterality: Bilateral;   TUBAL LIGATION      There were no vitals filed for this visit.   Subjective Assessment - 12/13/20 1004     Subjective its all going ok.    Pertinent History Neoadjuvant chemotherapy getting through about 50% before having to stop, s/p [facility]23[facility].  1/1 lymph nodes positive followed by  ALND on 10/11/18 with all 7 nodes negative.  Completed radiation  ER/PR/HER2 negative  Pt had fracture of proximal humerus with ORIF on 01/31/2019. " When it came back in February, I had 4 rounds of chemo and then bilateral mastectomy"  on 6/7 /2022 She did not have reconstructive surgery    Currently in Pain? No/denies                   LYMPHEDEMA/ONCOLOGY QUESTIONNAIRE - 12/13/20 0001       Left Upper Extremity Lymphedema   15 cm Proximal to Olecranon Process 31.6 cm    10 cm Proximal to Olecranon Process 34.1 cm    Olecranon Process 27 cm    10 cm Proximal to Ulnar Styloid Process 23.8 cm    Just Proximal  to Ulnar Styloid Process 15.7 cm    Across Hand at [employer] 18 cm    At [place] of 2nd Digit 5.9 cm                        [facility] Adult PT Treatment/Exercise - 12/13/20 0001       Manual Therapy   Manual Therapy Passive ROM    Manual Lymphatic Drainage (MLD) short neck, superficial and dep abdominals, right axillary nodes and establishment of anterior interaxillary pathway, left inguinal nodes and establishment of Lt axillo inguinal pathway, LUE working proximal to distal then retracing all steps    Compression Bandaging To Lt UE: Cocoa butter, thick stockinette, elastomull to digits 1-5 with paper tape on top, artiflex from wrist to axilla. 1 6 cm bandage at hand, 1 8 cm from wrist to upper arm, 1 12 cm bandage from wrist to axilla - reviewed remedial exercises while in bandages with pt returning demo    Passive ROM to the shoulder into flexion, abduction                       PT Short Term Goals - 12/06/20 1012       PT SHORT TERM GOAL #1   Title Pt will be independent in beginning home exercise program    Time 4    Period Weeks    Status On-going      PT SHORT TERM GOAL #2   Title Pt / family will be independent in manual lymph drainage and compression bandaging for left arm lymphedema    Time 4    Period Weeks    Status On-going               PT Long Term Goals - 12/06/20 1013       PT LONG TERM GOAL #1   Title Pt will have compression garments and possibly Flexitouch if needed to continue with long term management of lymphedema at home    Time 8    Period Weeks    Status On-going      PT LONG TERM GOAL #2   Title Pt will independent in a gradually progressive resisitve exercise program that she can continue in the community    Time 8    Period Weeks    Status On-going      PT LONG TERM GOAL #3   Title Pt will have reduction in circumference of left arm adn 10 cm proximal to olecranon by 3 cm    Baseline 35 cm on 10/11/2020. 35.5  on 12/06/2020    Time 8    Period Weeks  Status On-going      PT LONG TERM GOAL #4   Title Pt will decrease QDASH to < 10    Baseline 13.64 on 10/11/2020, 31.82 on 12/07/2020    Time 8    Period Weeks    Status On-going                   Plan - 12/13/20 1058     Clinical Impression Statement Continued CDT Lt UE.  No sig change in measurements from 2 days ago.  Gave pt link to [facility] caregiver bandaging video for her husband to try at home as she does not come back until tuesday.    PT Frequency 3x / week    PT Duration 8 weeks    PT Treatment/Interventions ADLs/Self Care Home Management;Manual lymph drainage;Patient/family education;Therapeutic exercise;Therapeutic activities;Passive range of motion;Scar mobilization;Manual techniques;Neuromuscular re-education;Taping;Orthotic Fit/Training;Compression bandaging    PT Next Visit Plan Cont complete decongestive therapy including  MLD and bandaging; Order flat knit garments when reduced. Teach and progress strengthening exercise as pt wants to go to [employer]; Flexitouch??    PT Home Exercise Plan Wear bandages daily, remedial exercises    Consulted and Agree with Plan of Care Patient             Patient will benefit from skilled therapeutic intervention in order to improve the following deficits and impairments:     Visit Diagnosis: Postmastectomy lymphedema  Aftercare following surgery for neoplasm  Abnormal posture     Problem List Patient Active Problem List   Diagnosis Date Noted   Colon cancer screening 11/12/2020   Family history of kidney cancer 10/11/2020   Recurrent breast cancer, left ([place]) 07/30/2020   Osteoporosis 04/16/2020   Closed fracture of right proximal humerus 01/23/2019   Port-A-Cath in place 05/17/2018   Genetic testing 05/10/2018   Family history of melanoma    Malignant neoplasm of upper-outer quadrant of left breast in female, estrogen receptor negative ([place]) 04/11/2018    Chest pain 05/13/2015   Shortness of breath 05/13/2015    [name], PT 12/13/2020, 10:59 AM  [facility] @ [place], [place], [address] Phone: [phone]   Fax:  [phone]  Name: Kathryn [name] MRN: [id number] Date of Birth: 1961-01-16

## 2020-12-16 DIAGNOSIS — Z9013 Acquired absence of bilateral breasts and nipples: Secondary | ICD-10-CM | POA: Diagnosis not present

## 2020-12-17 ENCOUNTER — Ambulatory Visit: Payer: BC Managed Care – PPO | Admitting: Rehabilitation

## 2020-12-17 ENCOUNTER — Encounter: Payer: Self-pay | Admitting: Rehabilitation

## 2020-12-17 ENCOUNTER — Other Ambulatory Visit: Payer: Self-pay

## 2020-12-17 DIAGNOSIS — Z171 Estrogen receptor negative status [ER-]: Secondary | ICD-10-CM | POA: Diagnosis not present

## 2020-12-17 DIAGNOSIS — I972 Postmastectomy lymphedema syndrome: Secondary | ICD-10-CM | POA: Diagnosis not present

## 2020-12-17 DIAGNOSIS — M25611 Stiffness of right shoulder, not elsewhere classified: Secondary | ICD-10-CM

## 2020-12-17 DIAGNOSIS — C50912 Malignant neoplasm of unspecified site of left female breast: Secondary | ICD-10-CM | POA: Diagnosis not present

## 2020-12-17 DIAGNOSIS — R293 Abnormal posture: Secondary | ICD-10-CM

## 2020-12-17 DIAGNOSIS — M25511 Pain in right shoulder: Secondary | ICD-10-CM | POA: Diagnosis not present

## 2020-12-17 DIAGNOSIS — M25612 Stiffness of left shoulder, not elsewhere classified: Secondary | ICD-10-CM | POA: Diagnosis not present

## 2020-12-17 DIAGNOSIS — Z483 Aftercare following surgery for neoplasm: Secondary | ICD-10-CM | POA: Diagnosis not present

## 2020-12-17 DIAGNOSIS — M6281 Muscle weakness (generalized): Secondary | ICD-10-CM | POA: Diagnosis not present

## 2020-12-17 DIAGNOSIS — C50412 Malignant neoplasm of upper-outer quadrant of left female breast: Secondary | ICD-10-CM | POA: Diagnosis not present

## 2020-12-17 NOTE — Therapy (Signed)
[facility] @ [place] [place], [place], [address] Phone: [phone]   Fax:  [phone]  Physical Therapy Treatment  Patient Details  Name: Kathryn [name] MRN: [id number] Date of Birth: 11-01-1960 Referring Provider (PT): Dr. Brantley Stage   Encounter Date: 12/17/2020   PT End of Session - 12/17/20 1357     Visit Number 6    Number of Visits 24    Date for PT Re-Evaluation 02/06/21    PT Start Time 1318   arrived late   PT Stop Time 1356    PT Time Calculation (min) 38 min             Past Medical History:  Diagnosis Date   Anemia    during chemo   Breast cancer ([place])    stage 3 - left   Family history of kidney cancer    Family history of melanoma    GERD (gastroesophageal reflux disease)    resolved   Headache    hormone related, none since 1st child was born    Personal history of chemotherapy    Personal history of radiation therapy    Pleurisy     Past Surgical History:  Procedure Laterality Date   AXILLARY LYMPH NODE DISSECTION Left 10/11/2018   Procedure: LEFT AXILLARY LYMPH NODE DISSECTION;  Surgeon: Erroll Luna, MD;  Location: [facility];  Service: General;  Laterality: Left;   BREAST LUMPECTOMY Left 08/2018   BREAST LUMPECTOMY WITH RADIOACTIVE SEED AND SENTINEL LYMPH NODE BIOPSY Left 09/23/2018   Procedure: LEFT BREAST LUMPECTOMY WITH RADIOACTIVE SEED AND LEFT AXILLARY TARGETED LYMPH NODE BIOPY AND  LEFT AXILLARY SENTINEL LYMPH NODE [place];  Surgeon: Erroll Luna, MD;  Location: [facility];  Service: General;  Laterality: Left;   CESAREAN SECTION     x3   FRACTURE SURGERY  2020   humerus - w/ Metta Clines. Supple, MD   ORIF HUMERUS FRACTURE Right 01/31/2019   Procedure: OPEN REDUCTION INTERNAL FIXATION (ORIF) Right 3 part proximal humerus fracture;  Surgeon: Justice Britain, MD;  [facility];  Service: Orthopedics;  Laterality: Right;  160mn   PORT-A-CATH REMOVAL Right 07/18/2019    Procedure: REMOVAL PORT-A-CATH;  Surgeon: CErroll Luna MD;  [facility]  Service: General;  Laterality: Right;   PORTACATH PLACEMENT N/A 04/28/2018   Procedure: INSERTION PORT-A-CATH WITH ULTRASOUND;  Surgeon: CErroll Luna MD;  [facility]  Service: General;  Laterality: N/A;   PORTACATH PLACEMENT N/A 04/25/2020   Procedure: INSERTION PORT-A-CATH;  Surgeon: CErroll Luna MD;  [facility];  Service: General;  Laterality: N/A;  60 MIN IN LDOW PLEASE   SENTINEL NODE BIOPSY Left 07/30/2020   Procedure: LEFT SENTINEL NODE BIOPSY;  Surgeon: CErroll Luna MD;  [facility]  Service: General;  Laterality: Left;   SIMPLE MASTECTOMY WITH AXILLARY SENTINEL NODE BIOPSY Bilateral 07/30/2020   Procedure: BILATERAL SIMPLE MASTECTOMY;  Surgeon: CErroll Luna MD;  [facility]  Service: General;  Laterality: Bilateral;   TUBAL LIGATION      There were no vitals filed for this visit.   Subjective Assessment - 12/17/20 1317     Subjective My husband wrapped it but then I had to wake up and take them off because it was too tight and painful.  So I need him to come watch you guys.    Pertinent History Neoadjuvant chemotherapy getting through about 50% before having to stop, s/p lumpectomy on 09/23/18 due  to [facility] and DCIS.  1/1 lymph nodes positive followed by ALND on 10/11/18 with all 7 nodes negative.  Completed radiation  ER/PR/HER2 negative  Pt had fracture of proximal humerus with ORIF on 01/31/2019. " When it came back in February, I had 4 rounds of chemo and then bilateral mastectomy"  on 6/7 /2022 She did not have reconstructive surgery    Currently in Pain? No/denies                               [facility] Adult PT Treatment/Exercise - 12/17/20 0001       Manual Therapy   Manual therapy comments made video for husband on pt's phone    Manual Lymphatic Drainage (MLD) short neck, superficial and dep abdominals, right axillary nodes and  establishment of anterior interaxillary pathway, left inguinal nodes and establishment of Lt axillo inguinal pathway, LUE working proximal to distal then retracing all steps    Compression Bandaging To Lt UE: Cocoa butter, thick stockinette, elastomull to digits 1-4, rosidal foam from wrist to axilla. 1 6 cm bandage at hand, 1 8 cm from wrist to upper arm, 1 12 cm bandage from wrist to axilla -    Passive ROM to the shoulder into flexion, abduction                       PT Short Term Goals - 12/06/20 1012       PT SHORT TERM GOAL #1   Title Pt will be independent in beginning home exercise program    Time 4    Period Weeks    Status On-going      PT SHORT TERM GOAL #2   Title Pt / family will be independent in manual lymph drainage and compression bandaging for left arm lymphedema    Time 4    Period Weeks    Status On-going               PT Long Term Goals - 12/06/20 1013       PT LONG TERM GOAL #1   Title Pt will have compression garments and possibly Flexitouch if needed to continue with long term management of lymphedema at home    Time 8    Period Weeks    Status On-going      PT LONG TERM GOAL #2   Title Pt will independent in a gradually progressive resisitve exercise program that she can continue in the community    Time 8    Period Weeks    Status On-going      PT LONG TERM GOAL #3   Title Pt will have reduction in circumference of left arm adn 10 cm proximal to olecranon by 3 cm    Baseline 35 cm on 10/11/2020. 35.5 on 12/06/2020    Time 8    Period Weeks    Status On-going      PT LONG TERM GOAL #4   Title Pt will decrease QDASH to < 10    Baseline 13.64 on 10/11/2020, 31.82 on 12/07/2020    Time 8    Period Weeks    Status On-going                   Plan - 12/17/20 1358     Clinical Impression Statement continued CDT to LT UE.  medial elbow pocket of edema today.  Pt's husband attempted to  wrap but had trouble with getting  it too tight.  Made a video on her phone and switched to rosidal foam.  pt has a pump trial tomorrow.    PT Next Visit Plan how was pump trial? how was rosidal? Cont complete decongestive therapy including  MLD and bandaging; Order flat knit garments when reduced.    Consulted and Agree with Plan of Care Patient             Patient will benefit from skilled therapeutic intervention in order to improve the following deficits and impairments:     Visit Diagnosis: Postmastectomy lymphedema  Abnormal posture  Aftercare following surgery for neoplasm  Stiffness of right shoulder, not elsewhere classified     Problem List Patient Active Problem List   Diagnosis Date Noted   Colon cancer screening 11/12/2020   Family history of kidney cancer 10/11/2020   Recurrent breast cancer, left ([place]) 07/30/2020   Osteoporosis 04/16/2020   Closed fracture of right proximal humerus 01/23/2019   Port-A-Cath in place 05/17/2018   Genetic testing 05/10/2018   Family history of melanoma    Malignant neoplasm of upper-outer quadrant of left breast in female, estrogen receptor negative ([place]) 04/11/2018   Chest pain 05/13/2015   Shortness of breath 05/13/2015    [name], PT 12/17/2020, 2:00 PM  [facility] @ [place], [place], [address] Phone: [phone]   Fax:  [phone]  Name: ANASTASIJA ANFINSON MRN: [id number] Date of Birth: Nov 18, 1960

## 2020-12-19 ENCOUNTER — Ambulatory Visit: Payer: BC Managed Care – PPO | Admitting: Rehabilitation

## 2020-12-19 ENCOUNTER — Other Ambulatory Visit: Payer: Self-pay

## 2020-12-19 DIAGNOSIS — M6281 Muscle weakness (generalized): Secondary | ICD-10-CM | POA: Diagnosis not present

## 2020-12-19 DIAGNOSIS — Z171 Estrogen receptor negative status [ER-]: Secondary | ICD-10-CM | POA: Diagnosis not present

## 2020-12-19 DIAGNOSIS — Z483 Aftercare following surgery for neoplasm: Secondary | ICD-10-CM

## 2020-12-19 DIAGNOSIS — R293 Abnormal posture: Secondary | ICD-10-CM

## 2020-12-19 DIAGNOSIS — I972 Postmastectomy lymphedema syndrome: Secondary | ICD-10-CM | POA: Diagnosis not present

## 2020-12-19 DIAGNOSIS — M25612 Stiffness of left shoulder, not elsewhere classified: Secondary | ICD-10-CM | POA: Diagnosis not present

## 2020-12-19 DIAGNOSIS — M25611 Stiffness of right shoulder, not elsewhere classified: Secondary | ICD-10-CM | POA: Diagnosis not present

## 2020-12-19 DIAGNOSIS — C50912 Malignant neoplasm of unspecified site of left female breast: Secondary | ICD-10-CM | POA: Diagnosis not present

## 2020-12-19 DIAGNOSIS — M25511 Pain in right shoulder: Secondary | ICD-10-CM | POA: Diagnosis not present

## 2020-12-19 DIAGNOSIS — C50412 Malignant neoplasm of upper-outer quadrant of left female breast: Secondary | ICD-10-CM | POA: Diagnosis not present

## 2020-12-19 NOTE — Therapy (Signed)
[facility] @ [place], [place], [address] Phone: [phone]   Fax:  [phone]  Physical Therapy Treatment  Patient Details  Name: Kathryn [name] MRN: [id number] Date of Birth: 08-Oct-1960 Referring Provider (PT): Dr. Brantley Stage   Encounter Date: 12/19/2020   PT End of Session - 12/19/20 1050     Visit Number 7    Number of Visits 24    Date for PT Re-Evaluation 02/06/21    PT Start Time 0906    PT Stop Time 0955    PT Time Calculation (min) 49 min    Activity Tolerance Patient tolerated treatment well    Behavior During Therapy [facility] for tasks assessed/performed             Past Medical History:  Diagnosis Date   Anemia    during chemo   Breast cancer ([place])    stage 3 - left   Family history of kidney cancer    Family history of melanoma    GERD (gastroesophageal reflux disease)    resolved   Headache    hormone related, none since 1st child was born    Personal history of chemotherapy    Personal history of radiation therapy    Pleurisy     Past Surgical History:  Procedure Laterality Date   AXILLARY LYMPH NODE DISSECTION Left 10/11/2018   Procedure: LEFT AXILLARY LYMPH NODE DISSECTION;  Surgeon: Erroll Luna, MD;  Location: [facility];  Service: General;  Laterality: Left;   BREAST LUMPECTOMY Left 08/2018   BREAST LUMPECTOMY WITH RADIOACTIVE SEED AND SENTINEL LYMPH NODE BIOPSY Left 09/23/2018   Procedure: LEFT BREAST LUMPECTOMY WITH RADIOACTIVE SEED AND LEFT AXILLARY TARGETED LYMPH NODE BIOPY AND  LEFT AXILLARY SENTINEL LYMPH NODE [place];  Surgeon: Erroll Luna, MD;  Location: [facility];  Service: General;  Laterality: Left;   CESAREAN SECTION     x3   FRACTURE SURGERY  2020   humerus - w/ Metta Clines. Supple, MD   ORIF HUMERUS FRACTURE Right 01/31/2019   Procedure: OPEN REDUCTION INTERNAL FIXATION (ORIF) Right 3 part proximal humerus fracture;  Surgeon: Justice Britain, MD;   [facility];  Service: Orthopedics;  Laterality: Right;  164mn   PORT-A-CATH REMOVAL Right 07/18/2019   Procedure: REMOVAL PORT-A-CATH;  Surgeon: CErroll Luna MD;  [facility]  Service: General;  Laterality: Right;   PORTACATH PLACEMENT N/A 04/28/2018   Procedure: INSERTION PORT-A-CATH WITH ULTRASOUND;  Surgeon: CErroll Luna MD;  [facility]  Service: General;  Laterality: N/A;   PORTACATH PLACEMENT N/A 04/25/2020   Procedure: INSERTION PORT-A-CATH;  Surgeon: CErroll Luna MD;  [facility];  Service: General;  Laterality: N/A;  60 MIN IN LDOW PLEASE   SENTINEL NODE BIOPSY Left 07/30/2020   Procedure: LEFT SENTINEL NODE BIOPSY;  Surgeon: CErroll Luna MD;  [facility]  Service: General;  Laterality: Left;   SIMPLE MASTECTOMY WITH AXILLARY SENTINEL NODE BIOPSY Bilateral 07/30/2020   Procedure: BILATERAL SIMPLE MASTECTOMY;  Surgeon: CErroll Luna MD;  [facility]  Service: General;  Laterality: Bilateral;   TUBAL LIGATION      There were no vitals filed for this visit.   Subjective Assessment - 12/19/20 1048     Subjective my daughter helped me wrap it.  I really liked that pump i will be getting one soon .    Pertinent History Neoadjuvant chemotherapy getting through about 50% before having to stop, s/p lumpectomy  on 09/23/18 due to [facility] and DCIS.  1/1 lymph nodes positive followed by ALND on 10/11/18 with all 7 nodes negative.  Completed radiation  ER/PR/HER2 negative  Pt had fracture of proximal humerus with ORIF on 01/31/2019. " When it came back in February, I had 4 rounds of chemo and then bilateral mastectomy"  on 6/7 /2022 She did not have reconstructive surgery    Currently in Pain? No/denies                   LYMPHEDEMA/ONCOLOGY QUESTIONNAIRE - 12/19/20 0001       Left Upper Extremity Lymphedema   At Axilla  32.3 cm    15 cm Proximal to Olecranon Process 31 cm    10 cm Proximal to Olecranon Process 33.6 cm     Olecranon Process 27.3 cm    10 cm Proximal to Ulnar Styloid Process 23.8 cm    Just Proximal to Ulnar Styloid Process 15.5 cm    Across Hand at [employer] 18 cm    At [place] of 2nd Digit 5.9 cm                        [facility] Adult PT Treatment/Exercise - 12/19/20 0001       Manual Therapy   Manual Therapy Edema management    Edema Management measured UE and compared to the size chart for a RTW medi mondi which she does fit into but will also consider custom if this is better    Manual Lymphatic Drainage (MLD) short neck, superficial and dep abdominals, right axillary nodes and establishment of anterior interaxillary pathway, left inguinal nodes and establishment of Lt axillo inguinal pathway, LUE working proximal to distal then retracing all steps    Compression Bandaging To Lt UE: Cocoa butter, thick stockinette, elastomull to digits 1-4, rosidal foam from wrist to axilla. 1 6 cm bandage at hand, 1 8 cm from wrist to upper arm, 1 12 cm bandage from wrist to axilla -                       PT Short Term Goals - 12/06/20 1012       PT SHORT TERM GOAL #1   Title Pt will be independent in beginning home exercise program    Time 4    Period Weeks    Status On-going      PT SHORT TERM GOAL #2   Title Pt / family will be independent in manual lymph drainage and compression bandaging for left arm lymphedema    Time 4    Period Weeks    Status On-going               PT Long Term Goals - 12/06/20 1013       PT LONG TERM GOAL #1   Title Pt will have compression garments and possibly Flexitouch if needed to continue with long term management of lymphedema at home    Time 8    Period Weeks    Status On-going      PT LONG TERM GOAL #2   Title Pt will independent in a gradually progressive resisitve exercise program that she can continue in the community    Time 8    Period Weeks    Status On-going      PT LONG TERM GOAL #3   Title Pt will have  reduction in circumference of left arm adn  10 cm proximal to olecranon by 3 cm    Baseline 35 cm on 10/11/2020. 35.5 on 12/06/2020    Time 8    Period Weeks    Status On-going      PT LONG TERM GOAL #4   Title Pt will decrease QDASH to < 10    Baseline 13.64 on 10/11/2020, 31.82 on 12/07/2020    Time 8    Period Weeks    Status On-going                   Plan - 12/19/20 1051     Clinical Impression Statement continued CDT to the LT UE.  Pt will be getting her pump soon after the trial and like how it felt.  Will continue to wrap and use pump for a few weeks for max decongestion before getting a sleeve    PT Frequency 3x / week    PT Duration 8 weeks    PT Treatment/Interventions ADLs/Self Care Home Management;Manual lymph drainage;Patient/family education;Therapeutic exercise;Therapeutic activities;Passive range of motion;Scar mobilization;Manual techniques;Neuromuscular re-education;Taping;Orthotic Fit/Training;Compression bandaging    PT Next Visit Plan Cont complete decongestive therapy including  MLD and bandaging; Order flat knit garments when reduced. - mondi RTW or custom    Consulted and Agree with Plan of Care Patient             Patient will benefit from skilled therapeutic intervention in order to improve the following deficits and impairments:     Visit Diagnosis: Postmastectomy lymphedema  Abnormal posture  Aftercare following surgery for neoplasm     Problem List Patient Active Problem List   Diagnosis Date Noted   Colon cancer screening 11/12/2020   Family history of kidney cancer 10/11/2020   Recurrent breast cancer, left ([place]) 07/30/2020   Osteoporosis 04/16/2020   Closed fracture of right proximal humerus 01/23/2019   Port-A-Cath in place 05/17/2018   Genetic testing 05/10/2018   Family history of melanoma    Malignant neoplasm of upper-outer quadrant of left breast in female, estrogen receptor negative ([place]) 04/11/2018   Chest pain  05/13/2015   Shortness of breath 05/13/2015    [name], PT 12/19/2020, 10:54 AM  [facility] @ [place], [place], [address] Phone: [phone]   Fax:  [phone]  Name: SHTERNA LARAMEE MRN: [id number] Date of Birth: March 31, 1960

## 2020-12-20 DIAGNOSIS — I890 Lymphedema, not elsewhere classified: Secondary | ICD-10-CM | POA: Diagnosis not present

## 2020-12-23 ENCOUNTER — Other Ambulatory Visit: Payer: Self-pay

## 2020-12-23 ENCOUNTER — Encounter: Payer: Self-pay | Admitting: Rehabilitation

## 2020-12-23 ENCOUNTER — Ambulatory Visit: Payer: BC Managed Care – PPO | Admitting: Rehabilitation

## 2020-12-23 DIAGNOSIS — M25611 Stiffness of right shoulder, not elsewhere classified: Secondary | ICD-10-CM | POA: Diagnosis not present

## 2020-12-23 DIAGNOSIS — Z483 Aftercare following surgery for neoplasm: Secondary | ICD-10-CM

## 2020-12-23 DIAGNOSIS — R293 Abnormal posture: Secondary | ICD-10-CM

## 2020-12-23 DIAGNOSIS — M25612 Stiffness of left shoulder, not elsewhere classified: Secondary | ICD-10-CM | POA: Diagnosis not present

## 2020-12-23 DIAGNOSIS — I972 Postmastectomy lymphedema syndrome: Secondary | ICD-10-CM | POA: Diagnosis not present

## 2020-12-23 DIAGNOSIS — C50412 Malignant neoplasm of upper-outer quadrant of left female breast: Secondary | ICD-10-CM | POA: Diagnosis not present

## 2020-12-23 DIAGNOSIS — C50912 Malignant neoplasm of unspecified site of left female breast: Secondary | ICD-10-CM | POA: Diagnosis not present

## 2020-12-23 DIAGNOSIS — Z171 Estrogen receptor negative status [ER-]: Secondary | ICD-10-CM | POA: Diagnosis not present

## 2020-12-23 DIAGNOSIS — M6281 Muscle weakness (generalized): Secondary | ICD-10-CM | POA: Diagnosis not present

## 2020-12-23 DIAGNOSIS — M25511 Pain in right shoulder: Secondary | ICD-10-CM | POA: Diagnosis not present

## 2020-12-23 NOTE — Therapy (Signed)
[facility] @ [address] [place], [place], [address] Phone: [phone]   Fax:  [phone]  Physical Therapy Treatment  Patient Details  Name: Kathryn [name] MRN: [id number] Date of Birth: [birth year] Referring Provider (PT): Dr. Brantley Stage   Encounter Date: 12/23/2020   PT End of Session - 12/23/20 1046     Visit Number 8    Number of Visits 24    Date for PT Re-Evaluation 02/06/21    PT Start Time 0955    PT Stop Time 1046    PT Time Calculation (min) 51 min    Activity Tolerance Patient tolerated treatment well    Behavior During Therapy [facility] for tasks assessed/performed             Past Medical History:  Diagnosis Date   Anemia    during chemo   Breast cancer ([place])    stage 3 - left   Family history of kidney cancer    Family history of melanoma    GERD (gastroesophageal reflux disease)    resolved   Headache    hormone related, none since 1st child was born    Personal history of chemotherapy    Personal history of radiation therapy    Pleurisy     Past Surgical History:  Procedure Laterality Date   AXILLARY LYMPH NODE DISSECTION Left 10/11/2018   Procedure: LEFT AXILLARY LYMPH NODE DISSECTION;  Surgeon: Erroll Luna, MD;  Location: [facility];  Service: General;  Laterality: Left;   BREAST LUMPECTOMY Left 08/2018   BREAST LUMPECTOMY WITH RADIOACTIVE SEED AND SENTINEL LYMPH NODE BIOPSY Left 09/23/2018   Procedure: LEFT BREAST LUMPECTOMY WITH RADIOACTIVE SEED AND LEFT AXILLARY TARGETED LYMPH NODE BIOPY AND  LEFT [place];  Surgeon: Erroll Luna, MD;  Location: [facility];  Service: General;  Laterality: Left;   CESAREAN SECTION     x3   FRACTURE SURGERY  2020   humerus - w/ Metta Clines. Supple, MD   ORIF HUMERUS FRACTURE Right 01/31/2019   Procedure: OPEN REDUCTION INTERNAL FIXATION (ORIF) Right 3 part proximal humerus fracture;  Surgeon: Justice Britain, MD;   [facility];  Service: Orthopedics;  Laterality: Right;  134mn   PORT-A-CATH REMOVAL Right 07/18/2019   Procedure: REMOVAL PORT-A-CATH;  Surgeon: CErroll Luna MD;  [facility]  Service: General;  Laterality: Right;   PORTACATH PLACEMENT N/A 04/28/2018   Procedure: INSERTION PORT-A-CATH WITH ULTRASOUND;  Surgeon: CErroll Luna MD;  [facility]  Service: General;  Laterality: N/A;   PORTACATH PLACEMENT N/A 04/25/2020   Procedure: INSERTION PORT-A-CATH;  Surgeon: CErroll Luna MD;  [facility];  Service: General;  Laterality: N/A;  60 MIN IN LDOW PLEASE   SENTINEL NODE BIOPSY Left 07/30/2020   Procedure: LEFT SENTINEL NODE BIOPSY;  Surgeon: CErroll Luna MD;  [facility]  Service: General;  Laterality: Left;   SIMPLE MASTECTOMY WITH AXILLARY SENTINEL NODE BIOPSY Bilateral 07/30/2020   Procedure: BILATERAL SIMPLE MASTECTOMY;  Surgeon: CErroll Luna MD;  [facility]  Service: General;  Laterality: Bilateral;   TUBAL LIGATION      There were no vitals filed for this visit.   Subjective Assessment - 12/23/20 0956     Subjective The wrap stayed on all weekend    Pertinent History Neoadjuvant chemotherapy getting through about 50% before having to stop, s/p lumpectomy [facility]23/[facility].  1/1 lymph nodes positive followed  by ALND on 10/11/18 with all 7 nodes negative.  Completed radiation  ER/PR/HER2 negative  Pt had fracture of proximal humerus with ORIF on 01/31/2019. " When it came back in February, I had 4 rounds of chemo and then bilateral mastectomy"  on 6/7 /2022 She did not have reconstructive surgery    Currently in Pain? No/denies                   LYMPHEDEMA/ONCOLOGY QUESTIONNAIRE - 12/23/20 0001       Left Upper Extremity Lymphedema   At Axilla  30.6 cm (P)     15 cm Proximal to Olecranon Process 31 cm (P)     10 cm Proximal to Olecranon Process 32.3 cm (P)     Olecranon Process 27.5 cm (P)     10 cm  Proximal to Ulnar Styloid Process 24 cm (P)     Just Proximal to Ulnar Styloid Process 15.3 cm (P)     Across Hand at [employer] 18 cm (P)     At [place] of 2nd Digit 5.8 cm (P)                         [facility] Adult PT Treatment/Exercise - 12/23/20 0001       Manual Therapy   Manual Lymphatic Drainage (MLD) short neck, superficial and dep abdominals, right axillary nodes and establishment of anterior interaxillary pathway, left inguinal nodes and establishment of Lt axillo inguinal pathway, LUE working proximal to distal then retracing all steps    Compression Bandaging To Lt UE: Cocoa butter, thick stockinette, elastomull to digits 1-4, rosidal foam from wrist to axilla. 1 6 cm bandage at hand, 1 8 cm from wrist to upper arm, 1 12 cm bandage from wrist to axilla -                       PT Short Term Goals - 12/06/20 1012       PT SHORT TERM GOAL #1   Title Pt will be independent in beginning home exercise program    Time 4    Period Weeks    Status On-going      PT SHORT TERM GOAL #2   Title Pt / family will be independent in manual lymph drainage and compression bandaging for left arm lymphedema    Time 4    Period Weeks    Status On-going               PT Long Term Goals - 12/06/20 1013       PT LONG TERM GOAL #1   Title Pt will have compression garments and possibly Flexitouch if needed to continue with long term management of lymphedema at home    Time 8    Period Weeks    Status On-going      PT LONG TERM GOAL #2   Title Pt will independent in a gradually progressive resisitve exercise program that she can continue in the community    Time 8    Period Weeks    Status On-going      PT LONG TERM GOAL #3   Title Pt will have reduction in circumference of left arm adn 10 cm proximal to olecranon by 3 cm    Baseline 35 cm on 10/11/2020. 35.5 on 12/06/2020    Time 8    Period Weeks    Status On-going  PT LONG TERM GOAL #4    Title Pt will decrease QDASH to < 10    Baseline 13.64 on 10/11/2020, 31.82 on 12/07/2020    Time 8    Period Weeks    Status On-going                   Plan - 12/23/20 1047     Clinical Impression Statement upper arm has decreased around 1cm with bandages on over the weekend.  Should get pump today which will help with decongestion.    PT Frequency 3x / week    PT Duration 8 weeks    PT Treatment/Interventions ADLs/Self Care Home Management;Manual lymph drainage;Patient/family education;Therapeutic exercise;Therapeutic activities;Passive range of motion;Scar mobilization;Manual techniques;Neuromuscular re-education;Taping;Orthotic Fit/Training;Compression bandaging    PT Next Visit Plan Cont complete decongestive therapy including  MLD and bandaging; Order flat knit garments when reduced. - mondi RTW or custom    Consulted and Agree with Plan of Care Patient             Patient will benefit from skilled therapeutic intervention in order to improve the following deficits and impairments:     Visit Diagnosis: Postmastectomy lymphedema  Abnormal posture  Aftercare following surgery for neoplasm     Problem List Patient Active Problem List   Diagnosis Date Noted   Colon cancer screening 11/12/2020   Family history of kidney cancer 10/11/2020   Recurrent breast cancer, left ([place]) 07/30/2020   Osteoporosis 04/16/2020   Closed fracture of right proximal humerus 01/23/2019   Port-A-Cath in place 05/17/2018   Genetic testing 05/10/2018   Family history of melanoma    Malignant neoplasm of upper-outer quadrant of left breast in female, estrogen receptor negative ([place]) 04/11/2018   Chest pain 05/13/2015   Shortness of breath 05/13/2015    [name], PT 12/23/2020, 10:48 AM  [facility] @ [place], [place], [address] Phone: [phone]   Fax:  [phone]  Name: Kathryn [name] MRN:  [id number] Date of Birth: November 14, 1960

## 2020-12-25 ENCOUNTER — Other Ambulatory Visit: Payer: Self-pay

## 2020-12-25 ENCOUNTER — Ambulatory Visit: Payer: BC Managed Care – PPO | Attending: Surgery | Admitting: Rehabilitation

## 2020-12-25 ENCOUNTER — Encounter: Payer: Self-pay | Admitting: Rehabilitation

## 2020-12-25 DIAGNOSIS — I972 Postmastectomy lymphedema syndrome: Secondary | ICD-10-CM | POA: Insufficient documentation

## 2020-12-25 DIAGNOSIS — R293 Abnormal posture: Secondary | ICD-10-CM | POA: Diagnosis not present

## 2020-12-25 DIAGNOSIS — Z483 Aftercare following surgery for neoplasm: Secondary | ICD-10-CM | POA: Insufficient documentation

## 2020-12-25 DIAGNOSIS — M25612 Stiffness of left shoulder, not elsewhere classified: Secondary | ICD-10-CM | POA: Diagnosis not present

## 2020-12-25 NOTE — Therapy (Signed)
[facility] @ [place], [place], [address] Phone: [phone]   Fax:  [phone]  Physical Therapy Treatment  Patient Details  Name: Kathryn [name] MRN: [id number] Date of Birth: 1961-01-01 Referring Provider (PT): Dr. Brantley Stage   Encounter Date: 12/25/2020   PT End of Session - 12/25/20 1610     Visit Number 9    Number of Visits 24    Date for PT Re-Evaluation 02/06/21    PT Start Time 1400    PT Stop Time 1440    PT Time Calculation (min) 40 min    Activity Tolerance Patient tolerated treatment well    Behavior During Therapy [facility] for tasks assessed/performed             Past Medical History:  Diagnosis Date   Anemia    during chemo   Breast cancer ([place])    stage 3 - left   Family history of kidney cancer    Family history of melanoma    GERD (gastroesophageal reflux disease)    resolved   Headache    hormone related, none since 1st child was born    Personal history of chemotherapy    Personal history of radiation therapy    Pleurisy     Past Surgical History:  Procedure Laterality Date   AXILLARY LYMPH NODE DISSECTION Left 10/11/2018   Procedure: LEFT AXILLARY LYMPH NODE DISSECTION;  Surgeon: Erroll Luna, MD;  Location: [facility];  Service: General;  Laterality: Left;   BREAST LUMPECTOMY Left 08/2018   BREAST LUMPECTOMY WITH RADIOACTIVE SEED AND SENTINEL LYMPH NODE BIOPSY Left 09/23/2018   Procedure: LEFT BREAST LUMPECTOMY WITH RADIOACTIVE SEED AND LEFT AXILLARY TARGETED LYMPH NODE BIOPY AND  LEFT AXILLARY SENTINEL LYMPH NODE [place];  Surgeon: Erroll Luna, MD;  Location: [facility];  Service: General;  Laterality: Left;   CESAREAN SECTION     x3   FRACTURE SURGERY  2020   humerus - w/ Metta Clines. Supple, MD   ORIF HUMERUS FRACTURE Right 01/31/2019   Procedure: OPEN REDUCTION INTERNAL FIXATION (ORIF) Right 3 part proximal humerus fracture;  Surgeon: Justice Britain, MD;   [facility];  Service: Orthopedics;  Laterality: Right;  140mn   PORT-A-CATH REMOVAL Right 07/18/2019   Procedure: REMOVAL PORT-A-CATH;  Surgeon: CErroll Luna MD;  [facility]  Service: General;  Laterality: Right;   PORTACATH PLACEMENT N/A 04/28/2018   Procedure: INSERTION PORT-A-CATH WITH ULTRASOUND;  Surgeon: CErroll Luna MD;  [facility]  Service: General;  Laterality: N/A;   PORTACATH PLACEMENT N/A 04/25/2020   Procedure: INSERTION PORT-A-CATH;  Surgeon: CErroll Luna MD;  [facility];  Service: General;  Laterality: N/A;  60 MIN IN LDOW PLEASE   SENTINEL NODE BIOPSY Left 07/30/2020   Procedure: LEFT SENTINEL NODE BIOPSY;  Surgeon: CErroll Luna MD;  [facility]  Service: General;  Laterality: Left;   SIMPLE MASTECTOMY WITH AXILLARY SENTINEL NODE BIOPSY Bilateral 07/30/2020   Procedure: BILATERAL SIMPLE MASTECTOMY;  Surgeon: CErroll Luna MD;  [facility]  Service: General;  Laterality: Bilateral;   TUBAL LIGATION      There were no vitals filed for this visit.   Subjective Assessment - 12/25/20 1505     Subjective I had to take them off to get a work project done.  I got my pump    Pertinent History Neoadjuvant chemotherapy getting through about 50% before having to stop, s/p lumpectomy on 09/23/18  due to [facility] and DCIS.  1/1 lymph nodes positive followed by ALND on 10/11/18 with all 7 nodes negative.  Completed radiation  ER/PR/HER2 negative  Pt had fracture of proximal humerus with ORIF on 01/31/2019. " When it came back in February, I had 4 rounds of chemo and then bilateral mastectomy"  on 6/7 /2022 She did not have reconstructive surgery    Currently in Pain? No/denies                               [facility] Adult PT Treatment/Exercise - 12/25/20 0001       Manual Therapy   Manual Lymphatic Drainage (MLD) short neck, superficial and dep abdominals, right axillary nodes and establishment of anterior  interaxillary pathway, left inguinal nodes and establishment of Lt axillo inguinal pathway, LUE working proximal to distal then retracing all steps    Compression Bandaging To Lt UE: Cocoa butter, thick stockinette, elastomull to digits 1-4, rosidal foam from wrist to axilla. 1 6 cm bandage at hand, 1 8 cm from wrist to upper arm, 1 12 cm bandage from wrist to axilla -    Passive ROM to the shoulder into flexion, abduction                       PT Short Term Goals - 12/06/20 1012       PT SHORT TERM GOAL #1   Title Pt will be independent in beginning home exercise program    Time 4    Period Weeks    Status On-going      PT SHORT TERM GOAL #2   Title Pt / family will be independent in manual lymph drainage and compression bandaging for left arm lymphedema    Time 4    Period Weeks    Status On-going               PT Long Term Goals - 12/06/20 1013       PT LONG TERM GOAL #1   Title Pt will have compression garments and possibly Flexitouch if needed to continue with long term management of lymphedema at home    Time 8    Period Weeks    Status On-going      PT LONG TERM GOAL #2   Title Pt will independent in a gradually progressive resisitve exercise program that she can continue in the community    Time 8    Period Weeks    Status On-going      PT LONG TERM GOAL #3   Title Pt will have reduction in circumference of left arm adn 10 cm proximal to olecranon by 3 cm    Baseline 35 cm on 10/11/2020. 35.5 on 12/06/2020    Time 8    Period Weeks    Status On-going      PT LONG TERM GOAL #4   Title Pt will decrease QDASH to < 10    Baseline 13.64 on 10/11/2020, 31.82 on 12/07/2020    Time 8    Period Weeks    Status On-going                   Plan - 12/25/20 1612     Clinical Impression Statement pt is still doing very well.  Arm continues to be softer with most fibrosis at the medial elbow and lateral forearm.  Pt will do her pump for  the  first time tonight and her husband will rewrap her.    PT Frequency 3x / week    PT Duration 8 weeks    PT Treatment/Interventions ADLs/Self Care Home Management;Manual lymph drainage;Patient/family education;Therapeutic exercise;Therapeutic activities;Passive range of motion;Scar mobilization;Manual techniques;Neuromuscular re-education;Taping;Orthotic Fit/Training;Compression bandaging    PT Next Visit Plan how was pump? measure - cont CDT including  MLD and bandaging; Order flat knit garments when reduced. - mondi RTW or custom    Consulted and Agree with Plan of Care Patient             Patient will benefit from skilled therapeutic intervention in order to improve the following deficits and impairments:     Visit Diagnosis: Postmastectomy lymphedema  Aftercare following surgery for neoplasm     Problem List Patient Active Problem List   Diagnosis Date Noted   Colon cancer screening 11/12/2020   Family history of kidney cancer 10/11/2020   Recurrent breast cancer, left ([place]) 07/30/2020   Osteoporosis 04/16/2020   Closed fracture of right proximal humerus 01/23/2019   Port-A-Cath in place 05/17/2018   Genetic testing 05/10/2018   Family history of melanoma    Malignant neoplasm of upper-outer quadrant of left breast in female, estrogen receptor negative ([place]) 04/11/2018   Chest pain 05/13/2015   Shortness of breath 05/13/2015    [name], PT 12/25/2020, 4:14 PM  [facility] @ [place], [place], [address] Phone: [phone]   Fax:  [phone]  Name: Kathryn [name] MRN: [id number] Date of Birth: June 18, 1960

## 2020-12-27 ENCOUNTER — Ambulatory Visit: Payer: BC Managed Care – PPO | Admitting: Rehabilitation

## 2020-12-27 ENCOUNTER — Other Ambulatory Visit: Payer: Self-pay

## 2020-12-27 ENCOUNTER — Encounter: Payer: Self-pay | Admitting: Rehabilitation

## 2020-12-27 DIAGNOSIS — I972 Postmastectomy lymphedema syndrome: Secondary | ICD-10-CM | POA: Diagnosis not present

## 2020-12-27 DIAGNOSIS — M25612 Stiffness of left shoulder, not elsewhere classified: Secondary | ICD-10-CM | POA: Diagnosis not present

## 2020-12-27 DIAGNOSIS — R293 Abnormal posture: Secondary | ICD-10-CM | POA: Diagnosis not present

## 2020-12-27 DIAGNOSIS — Z483 Aftercare following surgery for neoplasm: Secondary | ICD-10-CM | POA: Diagnosis not present

## 2020-12-27 NOTE — Therapy (Signed)
[facility] @ [place], [place], [address] Phone: [phone]   Fax:  [phone]  Physical Therapy Treatment  Patient Details  Name: Kathryn [name] MRN: [id number] Date of Birth: 1960-05-19 Referring Provider (PT): Dr. Brantley Stage   Encounter Date: 12/27/2020   PT End of Session - 12/27/20 1042     Visit Number 10    Number of Visits 24    Date for PT Re-Evaluation 02/06/21    PT Start Time 1000    PT Stop Time 2263    PT Time Calculation (min) 41 min    Activity Tolerance Patient tolerated treatment well    Behavior During Therapy [facility] for tasks assessed/performed             Past Medical History:  Diagnosis Date   Anemia    during chemo   Breast cancer ([place])    stage 3 - left   Family history of kidney cancer    Family history of melanoma    GERD (gastroesophageal reflux disease)    resolved   Headache    hormone related, none since 1st child was born    Personal history of chemotherapy    Personal history of radiation therapy    Pleurisy     Past Surgical History:  Procedure Laterality Date   AXILLARY LYMPH NODE DISSECTION Left 10/11/2018   Procedure: LEFT AXILLARY LYMPH NODE DISSECTION;  Surgeon: Erroll Luna, MD;  Location: [facility];  Service: General;  Laterality: Left;   BREAST LUMPECTOMY Left 08/2018   BREAST LUMPECTOMY WITH RADIOACTIVE SEED AND SENTINEL LYMPH NODE BIOPSY Left 09/23/2018   Procedure: LEFT BREAST LUMPECTOMY WITH RADIOACTIVE SEED AND LEFT AXILLARY TARGETED LYMPH NODE BIOPY AND  LEFT [place];  Surgeon: Erroll Luna, MD;  Location: [facility];  Service: General;  Laterality: Left;   CESAREAN SECTION     x3   FRACTURE SURGERY  2020   humerus - w/ Metta Clines. Supple, MD   ORIF HUMERUS FRACTURE Right 01/31/2019   Procedure: OPEN REDUCTION INTERNAL FIXATION (ORIF) Right 3 part proximal humerus fracture;  Surgeon: Justice Britain, MD;   [facility];  Service: Orthopedics;  Laterality: Right;  11mn   PORT-A-CATH REMOVAL Right 07/18/2019   Procedure: REMOVAL PORT-A-CATH;  Surgeon: CErroll Luna MD;  [facility]  Service: General;  Laterality: Right;   PORTACATH PLACEMENT N/A 04/28/2018   Procedure: INSERTION PORT-A-CATH WITH ULTRASOUND;  Surgeon: CErroll Luna MD;  [facility]  Service: General;  Laterality: N/A;   PORTACATH PLACEMENT N/A 04/25/2020   Procedure: INSERTION PORT-A-CATH;  Surgeon: CErroll Luna MD;  [facility];  Service: General;  Laterality: N/A;  60 MIN IN LDOW PLEASE   SENTINEL NODE BIOPSY Left 07/30/2020   Procedure: LEFT SENTINEL NODE BIOPSY;  Surgeon: CErroll Luna MD;  [facility]  Service: General;  Laterality: Left;   SIMPLE MASTECTOMY WITH AXILLARY SENTINEL NODE BIOPSY Bilateral 07/30/2020   Procedure: BILATERAL SIMPLE MASTECTOMY;  Surgeon: CErroll Luna MD;  [facility]  Service: General;  Laterality: Bilateral;   TUBAL LIGATION      There were no vitals filed for this visit.   Subjective Assessment - 12/27/20 1007     Subjective I like the pump    Pertinent History Neoadjuvant chemotherapy getting through about 50% before having to stop, [facility].  1/1 lymph nodes positive followed by ALND  on 10/11/18 with all 7 nodes negative.  Completed radiation  ER/PR/HER2 negative  Pt had fracture of proximal humerus with ORIF on 01/31/2019. " When it came back in February, I had 4 rounds of chemo and then bilateral mastectomy"  on 6/7 /2022 She did not have reconstructive surgery    Currently in Pain? No/denies                   LYMPHEDEMA/ONCOLOGY QUESTIONNAIRE - 12/27/20 0001       Left Upper Extremity Lymphedema   At Axilla  32 cm (P)     15 cm Proximal to Olecranon Process 32 cm (P)     10 cm Proximal to Olecranon Process 32.3 cm (P)     Olecranon Process 27.5 cm (P)     15 cm Proximal to Ulnar  Styloid Process 26.4 cm (P)     10 cm Proximal to Ulnar Styloid Process 23.3 cm (P)     Just Proximal to Ulnar Styloid Process 18.6 cm (P)     Across Hand at [employer] 18.2 cm (P)     At [place] of 2nd Digit 6 cm (P)                         [facility] Adult PT Treatment/Exercise - 12/27/20 0001       Manual Therapy   Manual Lymphatic Drainage (MLD) short neck, superficial and dep abdominals, right axillary nodes and establishment of anterior interaxillary pathway, left inguinal nodes and establishment of Lt axillo inguinal pathway, LUE working proximal to distal then retracing all steps    Compression Bandaging To Lt UE: Cocoa butter, thick stockinette, artiflex to hand, rosidal foam from wrist to axilla. 1 6 cm bandage at hand, 1 8 cm from wrist to upper arm, 1 12 cm bandage from wrist to axilla -    Passive ROM to the shoulder into flexion, abduction                       PT Short Term Goals - 12/06/20 1012       PT SHORT TERM GOAL #1   Title Pt will be independent in beginning home exercise program    Time 4    Period Weeks    Status On-going      PT SHORT TERM GOAL #2   Title Pt / family will be independent in manual lymph drainage and compression bandaging for left arm lymphedema    Time 4    Period Weeks    Status On-going               PT Long Term Goals - 12/06/20 1013       PT LONG TERM GOAL #1   Title Pt will have compression garments and possibly Flexitouch if needed to continue with long term management of lymphedema at home    Time 8    Period Weeks    Status On-going      PT LONG TERM GOAL #2   Title Pt will independent in a gradually progressive resisitve exercise program that she can continue in the community    Time 8    Period Weeks    Status On-going      PT LONG TERM GOAL #3   Title Pt will have reduction in circumference of left arm adn 10 cm proximal to olecranon by 3 cm    Baseline 35 cm on  10/11/2020. 35.5 on  12/06/2020    Time 8    Period Weeks    Status On-going      PT LONG TERM GOAL #4   Title Pt will decrease QDASH to < 10    Baseline 13.64 on 10/11/2020, 31.82 on 12/07/2020    Time 8    Period Weeks    Status On-going                   Plan - 12/27/20 1043     Clinical Impression Statement Pt now has compression pump and is ind with self bandaging.  Pt did not decrease since visit this past monday so she may be plateauing in terms of decongestion.  Will see her at least Mon and Wed of next week and should be able to go down to 2x per week and will order garment    PT Frequency 3x / week    PT Duration 8 weeks    PT Treatment/Interventions ADLs/Self Care Home Management;Manual lymph drainage;Patient/family education;Therapeutic exercise;Therapeutic activities;Passive range of motion;Scar mobilization;Manual techniques;Neuromuscular re-education;Taping;Orthotic Fit/Training;Compression bandaging    PT Next Visit Plan cont CDT including  MLD and bandaging; Order flat knit garments when reduced. - mondi RTW or custom    Consulted and Agree with Plan of Care Patient             Patient will benefit from skilled therapeutic intervention in order to improve the following deficits and impairments:     Visit Diagnosis: Postmastectomy lymphedema  Aftercare following surgery for neoplasm  Abnormal posture  Stiffness of left shoulder, not elsewhere classified     Problem List Patient Active Problem List   Diagnosis Date Noted   Colon cancer screening 11/12/2020   Family history of kidney cancer 10/11/2020   Recurrent breast cancer, left ([place]) 07/30/2020   Osteoporosis 04/16/2020   Closed fracture of right proximal humerus 01/23/2019   Port-A-Cath in place 05/17/2018   Genetic testing 05/10/2018   Family history of melanoma    Malignant neoplasm of upper-outer quadrant of left breast in female, estrogen receptor negative ([place]) 04/11/2018   Chest pain 05/13/2015    Shortness of breath 05/13/2015    [name], PT 12/27/2020, 10:44 AM  [facility] @ [place], [place], [address] Phone: [phone]   Fax:  [phone]  Name: Kathryn [name] MRN: [id number] Date of Birth: 1961/01/11

## 2020-12-30 ENCOUNTER — Ambulatory Visit: Payer: BC Managed Care – PPO | Admitting: Physical Therapy

## 2020-12-30 ENCOUNTER — Encounter: Payer: Self-pay | Admitting: Physical Therapy

## 2020-12-30 ENCOUNTER — Other Ambulatory Visit: Payer: Self-pay

## 2020-12-30 DIAGNOSIS — Z483 Aftercare following surgery for neoplasm: Secondary | ICD-10-CM

## 2020-12-30 DIAGNOSIS — I972 Postmastectomy lymphedema syndrome: Secondary | ICD-10-CM

## 2020-12-30 DIAGNOSIS — M25612 Stiffness of left shoulder, not elsewhere classified: Secondary | ICD-10-CM | POA: Diagnosis not present

## 2020-12-30 DIAGNOSIS — R293 Abnormal posture: Secondary | ICD-10-CM | POA: Diagnosis not present

## 2020-12-30 NOTE — Therapy (Signed)
[facility] @ [place], [place], [address] Phone: [phone]   Fax:  [phone]  Physical Therapy Treatment  Patient Details  Name: Kathryn [name] MRN: [id number] Date of Birth: December 15, 1960 Referring Provider (PT): Dr. Brantley Stage   Encounter Date: 12/30/2020   PT End of Session - 12/30/20 1557     Visit Number 11    Number of Visits 24    Date for PT Re-Evaluation 02/06/21    PT Start Time 3762    PT Stop Time 1555    PT Time Calculation (min) 49 min    Activity Tolerance Patient tolerated treatment well    Behavior During Therapy [facility] for tasks assessed/performed             Past Medical History:  Diagnosis Date   Anemia    during chemo   Breast cancer ([place])    stage 3 - left   Family history of kidney cancer    Family history of melanoma    GERD (gastroesophageal reflux disease)    resolved   Headache    hormone related, none since 1st child was born    Personal history of chemotherapy    Personal history of radiation therapy    Pleurisy     Past Surgical History:  Procedure Laterality Date   AXILLARY LYMPH NODE DISSECTION Left 10/11/2018   Procedure: LEFT AXILLARY LYMPH NODE DISSECTION;  Surgeon: Erroll Luna, MD;  Location: [facility];  Service: General;  Laterality: Left;   BREAST LUMPECTOMY Left 08/2018   BREAST LUMPECTOMY WITH RADIOACTIVE SEED AND SENTINEL LYMPH NODE BIOPSY Left 09/23/2018   Procedure: LEFT BREAST LUMPECTOMY WITH RADIOACTIVE SEED AND LEFT AXILLARY TARGETED LYMPH NODE BIOPY AND  LEFT AXILLARY SENTINEL LYMPH NODE [place];  Surgeon: Erroll Luna, MD;  Location: [facility];  Service: General;  Laterality: Left;   CESAREAN SECTION     x3   FRACTURE SURGERY  2020   humerus - w/ Metta Clines. Supple, MD   ORIF HUMERUS FRACTURE Right 01/31/2019   Procedure: OPEN REDUCTION INTERNAL FIXATION (ORIF) Right 3 part proximal humerus fracture;  Surgeon: Justice Britain, MD;   [facility];  Service: Orthopedics;  Laterality: Right;  185mn   PORT-A-CATH REMOVAL Right 07/18/2019   Procedure: REMOVAL PORT-A-CATH;  Surgeon: CErroll Luna MD;  [facility]  Service: General;  Laterality: Right;   PORTACATH PLACEMENT N/A 04/28/2018   Procedure: INSERTION PORT-A-CATH WITH ULTRASOUND;  Surgeon: CErroll Luna MD;  [facility]  Service: General;  Laterality: N/A;   PORTACATH PLACEMENT N/A 04/25/2020   Procedure: INSERTION PORT-A-CATH;  Surgeon: CErroll Luna MD;  [facility];  Service: General;  Laterality: N/A;  60 MIN IN LDOW PLEASE   SENTINEL NODE BIOPSY Left 07/30/2020   Procedure: LEFT SENTINEL NODE BIOPSY;  Surgeon: CErroll Luna MD;  [facility]  Service: General;  Laterality: Left;   SIMPLE MASTECTOMY WITH AXILLARY SENTINEL NODE BIOPSY Bilateral 07/30/2020   Procedure: BILATERAL SIMPLE MASTECTOMY;  Surgeon: CErroll Luna MD;  [facility]  Service: General;  Laterality: Bilateral;   TUBAL LIGATION      There were no vitals filed for this visit.   Subjective Assessment - 12/30/20 1507     Subjective The arm is doing good. I did the machine. I left the bandages off for a little while on Saturday to un decorate. It is a little sore today.    Pertinent History Neoadjuvant chemotherapy  getting through about 50% before having to stop, s/p lumpectomy on 09/23/18 due to [facility] and DCIS.  1/1 lymph nodes positive followed by ALND on 10/11/18 with all 7 nodes negative.  Completed radiation  ER/PR/HER2 negative  Pt had fracture of proximal humerus with ORIF on 01/31/2019. " When it came back in February, I had 4 rounds of chemo and then bilateral mastectomy"  on 6/7 /2022 She did not have reconstructive surgery    Patient Stated Goals to come back for rehab post mastectomy    Currently in Pain? No/denies    Pain Score 0-No pain                   LYMPHEDEMA/ONCOLOGY QUESTIONNAIRE - 12/30/20 0001       Left Upper  Extremity Lymphedema   At Axilla  33 cm    15 cm Proximal to Olecranon Process 32 cm    10 cm Proximal to Olecranon Process 34 cm    Olecranon Process 28 cm    15 cm Proximal to Ulnar Styloid Process 26.1 cm    10 cm Proximal to Ulnar Styloid Process 23 cm    Just Proximal to Ulnar Styloid Process 16.7 cm    Across Hand at [employer] 18.1 cm    At [place] of 2nd Digit 6.2 cm                        [facility] Adult PT Treatment/Exercise - 12/30/20 0001       Manual Therapy   Manual Lymphatic Drainage (MLD) short neck, superficial and dep abdominals, right axillary nodes and establishment of anterior interaxillary pathway, left inguinal nodes and establishment of Lt axillo inguinal pathway, LUE working proximal to distal then retracing all steps    Compression Bandaging To Lt UE: lotion, thick stockinette, artiflex to hand, rosidal foam from wrist to axilla. 1 6 cm bandage at hand, 1 8 cm from wrist to upper arm, 1 12 cm bandage from wrist to axilla -                       PT Short Term Goals - 12/06/20 1012       PT SHORT TERM GOAL #1   Title Pt will be independent in beginning home exercise program    Time 4    Period Weeks    Status On-going      PT SHORT TERM GOAL #2   Title Pt / family will be independent in manual lymph drainage and compression bandaging for left arm lymphedema    Time 4    Period Weeks    Status On-going               PT Long Term Goals - 12/06/20 1013       PT LONG TERM GOAL #1   Title Pt will have compression garments and possibly Flexitouch if needed to continue with long term management of lymphedema at home    Time 8    Period Weeks    Status On-going      PT LONG TERM GOAL #2   Title Pt will independent in a gradually progressive resisitve exercise program that she can continue in the community    Time 8    Period Weeks    Status On-going      PT LONG TERM GOAL #3   Title Pt will have reduction in  circumference of left arm  adn 10 cm proximal to olecranon by 3 cm    Baseline 35 cm on 10/11/2020. 35.5 on 12/06/2020    Time 8    Period Weeks    Status On-going      PT LONG TERM GOAL #4   Title Pt will decrease QDASH to < 10    Baseline 13.64 on 10/11/2020, 31.82 on 12/07/2020    Time 8    Period Weeks    Status On-going                   Plan - 12/30/20 1558     Clinical Impression Statement Pt reports she took her bandages off to undecorate this weekend because it was too difficult with bandages on. She has some soreness in the arm most likely due to overuse. From her elbow down she demonstrates a decrease in swelling but from elbow up demonstrates some increase in swelling. Continued with MLD and compression bandaging. Pt is approaching time to be measured for a compression garment.    PT Frequency 3x / week    PT Duration 8 weeks    PT Treatment/Interventions ADLs/Self Care Home Management;Manual lymph drainage;Patient/family education;Therapeutic exercise;Therapeutic activities;Passive range of motion;Scar mobilization;Manual techniques;Neuromuscular re-education;Taping;Orthotic Fit/Training;Compression bandaging    PT Next Visit Plan cont CDT including  MLD and bandaging; Order flat knit garments when reduced. - mondi RTW or custom    PT Home Exercise Plan Wear bandages daily, remedial exercises    Consulted and Agree with Plan of Care Patient             Patient will benefit from skilled therapeutic intervention in order to improve the following deficits and impairments:  Decreased mobility, Impaired perceived functional ability, Decreased range of motion, Decreased activity tolerance, Decreased coordination, Decreased knowledge of use of DME, Decreased strength, Increased fascial restricitons, Impaired flexibility, Impaired UE functional use, Postural dysfunction, Pain, Decreased scar mobility, Increased edema, Increased muscle spasms  Visit  Diagnosis: Postmastectomy lymphedema  Aftercare following surgery for neoplasm  Abnormal posture     Problem List Patient Active Problem List   Diagnosis Date Noted   Colon cancer screening 11/12/2020   Family history of kidney cancer 10/11/2020   Recurrent breast cancer, left ([place]) 07/30/2020   Osteoporosis 04/16/2020   Closed fracture of right proximal humerus 01/23/2019   Port-A-Cath in place 05/17/2018   Genetic testing 05/10/2018   Family history of melanoma    Malignant neoplasm of upper-outer quadrant of left breast in female, estrogen receptor negative ([place]) 04/11/2018   Chest pain 05/13/2015   Shortness of breath 05/13/2015    Allyson Sabal [place], PT 12/30/2020, 4:00 PM  [facility] @ [place], [place], [address] Phone: [phone]   Fax:  [phone]  Name: Kathryn [name] MRN: [id number] Date of Birth: 1960-05-19   Manus Gunning, PT 12/30/20 4:00 PM

## 2021-01-01 ENCOUNTER — Encounter: Payer: Self-pay | Admitting: Rehabilitation

## 2021-01-01 ENCOUNTER — Ambulatory Visit: Payer: BC Managed Care – PPO | Admitting: Rehabilitation

## 2021-01-01 ENCOUNTER — Other Ambulatory Visit: Payer: Self-pay

## 2021-01-01 DIAGNOSIS — R293 Abnormal posture: Secondary | ICD-10-CM | POA: Diagnosis not present

## 2021-01-01 DIAGNOSIS — Z483 Aftercare following surgery for neoplasm: Secondary | ICD-10-CM | POA: Diagnosis not present

## 2021-01-01 DIAGNOSIS — I972 Postmastectomy lymphedema syndrome: Secondary | ICD-10-CM | POA: Diagnosis not present

## 2021-01-01 DIAGNOSIS — M25612 Stiffness of left shoulder, not elsewhere classified: Secondary | ICD-10-CM | POA: Diagnosis not present

## 2021-01-01 NOTE — Therapy (Signed)
[facility] @ [address] [place], [place], [address] Phone: [phone]   Fax:  [phone]  Physical Therapy Treatment  Patient Details  Name: Kathryn [name] MRN: [id number] Date of Birth: 1960/10/15 Referring Provider (PT): Dr. Brantley Stage   Encounter Date: 01/01/2021   PT End of Session - 01/01/21 1551     Visit Number 12    Number of Visits 24    Date for PT Re-Evaluation 02/06/21    PT Start Time 8088    PT Stop Time 1545    PT Time Calculation (min) 39 min    Activity Tolerance Patient tolerated treatment well    Behavior During Therapy [facility] for tasks assessed/performed             Past Medical History:  Diagnosis Date   Anemia    during chemo   Breast cancer ([place])    stage 3 - left   Family history of kidney cancer    Family history of melanoma    GERD (gastroesophageal reflux disease)    resolved   Headache    hormone related, none since 1st child was born    Personal history of chemotherapy    Personal history of radiation therapy    Pleurisy     Past Surgical History:  Procedure Laterality Date   AXILLARY LYMPH NODE DISSECTION Left 10/11/2018   Procedure: LEFT AXILLARY LYMPH NODE DISSECTION;  Surgeon: Erroll Luna, MD;  Location: [facility];  Service: General;  Laterality: Left;   BREAST LUMPECTOMY Left 08/2018   BREAST LUMPECTOMY WITH RADIOACTIVE SEED AND SENTINEL LYMPH NODE BIOPSY Left 09/23/2018   Procedure: LEFT BREAST LUMPECTOMY WITH RADIOACTIVE SEED AND LEFT AXILLARY TARGETED LYMPH NODE BIOPY AND  LEFT [place];  Surgeon: Erroll Luna, MD;  Location: [place];  Service: General;  Laterality: Left;   CESAREAN SECTION     x3   FRACTURE SURGERY  2020   humerus - w/ Metta Clines. Supple, MD   ORIF HUMERUS FRACTURE Right 01/31/2019   Procedure: OPEN REDUCTION INTERNAL FIXATION (ORIF) Right 3 part proximal humerus fracture;  Surgeon: Justice Britain, MD;   [facility];  Service: Orthopedics;  Laterality: Right;  168mn   PORT-A-CATH REMOVAL Right 07/18/2019   Procedure: REMOVAL PORT-A-CATH;  Surgeon: CErroll Luna MD;  Location: [facility]  Service: General;  Laterality: Right;   PORTACATH PLACEMENT N/A 04/28/2018   Procedure: INSERTION PORT-A-CATH WITH ULTRASOUND;  Surgeon: CErroll Luna MD;  Location: [facility]  Service: General;  Laterality: N/A;   PORTACATH PLACEMENT N/A 04/25/2020   Procedure: INSERTION PORT-A-CATH;  Surgeon: CErroll Luna MD;  [facility];  Service: General;  Laterality: N/A;  60 MIN IN LDOW PLEASE   SENTINEL NODE BIOPSY Left 07/30/2020   Procedure: LEFT SENTINEL NODE BIOPSY;  Surgeon: CErroll Luna MD;  [place]  Service: General;  Laterality: Left;   SIMPLE MASTECTOMY WITH AXILLARY SENTINEL NODE BIOPSY Bilateral 07/30/2020   Procedure: BILATERAL SIMPLE MASTECTOMY;  Surgeon: CErroll Luna MD;  [place]  Service: General;  Laterality: Bilateral;   TUBAL LIGATION      There were no vitals filed for this visit.   Subjective Assessment - 01/01/21 1506     Subjective Doing okay    Pertinent History Neoadjuvant chemotherapy getting through about 50% before having to stop, s/p lumpect[facility]23[facility].  1/1 lymph nodes positive followed by ALND on 10/11/18  with all 7 nodes negative.  Completed radiation  ER/PR/HER2 negative  Pt had fracture of proximal humerus with ORIF on 01/31/2019. " When it came back in February, I had 4 rounds of chemo and then bilateral mastectomy"  on 6/7 /2022 She did not have reconstructive surgery    Currently in Pain? No/denies                               [facility] Adult PT Treatment/Exercise - 01/01/21 0001       Manual Therapy   Manual Therapy Other (comment)    Compression Bandaging To Lt UE: lotion, thick stockinette, artiflex to hand, rosidal foam from wrist to axilla. 1 6 cm bandage at hand, 1 8 cm from wrist to  upper arm, 1 12 cm bandage from wrist to axilla -    Other Manual Therapy measured by for medi flat knit class 2 sleeve and glove and night wrap.  emailed liz to double check order status and then will submit.                       PT Short Term Goals - 12/06/20 1012       PT SHORT TERM GOAL #1   Title Pt will be independent in beginning home exercise program    Time 4    Period Weeks    Status On-going      PT SHORT TERM GOAL #2   Title Pt / family will be independent in manual lymph drainage and compression bandaging for left arm lymphedema    Time 4    Period Weeks    Status On-going               PT Long Term Goals - 12/06/20 1013       PT LONG TERM GOAL #1   Title Pt will have compression garments and possibly Flexitouch if needed to continue with long term management of lymphedema at home    Time 8    Period Weeks    Status On-going      PT LONG TERM GOAL #2   Title Pt will independent in a gradually progressive resisitve exercise program that she can continue in the community    Time 8    Period Weeks    Status On-going      PT LONG TERM GOAL #3   Title Pt will have reduction in circumference of left arm adn 10 cm proximal to olecranon by 3 cm    Baseline 35 cm on 10/11/2020. 35.5 on 12/06/2020    Time 8    Period Weeks    Status On-going      PT LONG TERM GOAL #4   Title Pt will decrease QDASH to < 10    Baseline 13.64 on 10/11/2020, 31.82 on 12/07/2020    Time 8    Period Weeks    Status On-going                   Plan - 01/01/21 1551     Clinical Impression Statement measured for garments today.  will double check coverage on items and then [facility] to [facility].  Some slight purple at the elbow upon removing bandages but improved after bandages removed for awhile.    PT Frequency 3x / week    PT Duration 8 weeks    PT Treatment/Interventions ADLs/Self Care Home Management;Manual lymph drainage;Patient/family  education;Therapeutic  exercise;Therapeutic activities;Passive range of motion;Scar mobilization;Manual techniques;Neuromuscular re-education;Taping;Orthotic Fit/Training;Compression bandaging    PT Next Visit Plan cont CDT including  MLD and bandaging; Order flat knit garments when reduced. - mondi RTW or custom    Consulted and Agree with Plan of Care Patient             Patient will benefit from skilled therapeutic intervention in order to improve the following deficits and impairments:     Visit Diagnosis: Postmastectomy lymphedema  Aftercare following surgery for neoplasm     Problem List Patient Active Problem List   Diagnosis Date Noted   Colon cancer screening 11/12/2020   Family history of kidney cancer 10/11/2020   Recurrent breast cancer, left ([place]) 07/30/2020   Osteoporosis 04/16/2020   Closed fracture of right proximal humerus 01/23/2019   Port-A-Cath in place 05/17/2018   Genetic testing 05/10/2018   Family history of melanoma    Malignant neoplasm of upper-outer quadrant of left breast in female, estrogen receptor negative ([place]) 04/11/2018   Chest pain 05/13/2015   Shortness of breath 05/13/2015    [name], PT 01/01/2021, 3:53 PM  [facility] @ [address] [place], [place], [address] Phone: [phone]   Fax:  [phone]  Name: Kathryn [name] MRN: [id number] Date of Birth: [birth year]

## 2021-01-03 ENCOUNTER — Ambulatory Visit: Payer: BC Managed Care – PPO | Admitting: Rehabilitation

## 2021-01-03 ENCOUNTER — Encounter: Payer: Self-pay | Admitting: Rehabilitation

## 2021-01-03 ENCOUNTER — Other Ambulatory Visit: Payer: Self-pay

## 2021-01-03 DIAGNOSIS — Z483 Aftercare following surgery for neoplasm: Secondary | ICD-10-CM | POA: Diagnosis not present

## 2021-01-03 DIAGNOSIS — I972 Postmastectomy lymphedema syndrome: Secondary | ICD-10-CM

## 2021-01-03 DIAGNOSIS — M25612 Stiffness of left shoulder, not elsewhere classified: Secondary | ICD-10-CM | POA: Diagnosis not present

## 2021-01-03 DIAGNOSIS — R293 Abnormal posture: Secondary | ICD-10-CM | POA: Diagnosis not present

## 2021-01-03 NOTE — Therapy (Addendum)
[place] @ [address] [place], [place], [address] Phone: [phone]   Fax:  [phone]  Physical Therapy Treatment  Patient Details  Name: Kathryn [name] MRN: [id number] Date of Birth: [birth year] Referring Provider (PT): Dr. Brantley Stage   Encounter Date: 01/03/2021   PT End of Session - 01/03/21 1203     Visit Number 13    Number of Visits 24    Date for PT Re-Evaluation 02/06/21    PT Start Time 7482    PT Stop Time 1048    PT Time Calculation (min) 46 min    Activity Tolerance Patient tolerated treatment well    Behavior During Therapy [facility] for tasks assessed/performed             Past Medical History:  Diagnosis Date   Anemia    during chemo   Breast cancer ([place])    stage 3 - left   Family history of kidney cancer    Family history of melanoma    GERD (gastroesophageal reflux disease)    resolved   Headache    hormone related, none since 1st child was born    Personal history of chemotherapy    Personal history of radiation therapy    Pleurisy     Past Surgical History:  Procedure Laterality Date   AXILLARY LYMPH NODE DISSECTION Left 10/11/2018   Procedure: LEFT AXILLARY LYMPH NODE DISSECTION;  Surgeon: Erroll Luna, MD;  Location: [place];  Service: General;  Laterality: Left;   BREAST LUMPECTOMY Left 08/2018   BREAST LUMPECTOMY WITH RADIOACTIVE SEED AND SENTINEL LYMPH NODE BIOPSY Left 09/23/2018   Procedure: LEFT BREAST LUMPECTOMY WITH RADIOACTIVE SEED AND LEFT AXILLARY TARGETED LYMPH NODE BIOPY AND  LEFT [place];  Surgeon: Erroll Luna, MD;  Location: [place];  Service: General;  Laterality: Left;   CESAREAN SECTION     x3   FRACTURE SURGERY  2020   humerus - w/ Metta Clines. Supple, MD   ORIF HUMERUS FRACTURE Right 01/31/2019   Procedure: OPEN REDUCTION INTERNAL FIXATION (ORIF) Right 3 part proximal humerus fracture;  Surgeon: Justice Britain, MD;   [facility];  Service: Orthopedics;  Laterality: Right;  175mn   PORT-A-CATH REMOVAL Right 07/18/2019   Procedure: REMOVAL PORT-A-CATH;  Surgeon: CErroll Luna MD;  Location: [facility]  Service: General;  Laterality: Right;   PORTACATH PLACEMENT N/A 04/28/2018   Procedure: INSERTION PORT-A-CATH WITH ULTRASOUND;  Surgeon: CErroll Luna MD;  [place]  Service: General;  Laterality: N/A;   PORTACATH PLACEMENT N/A 04/25/2020   Procedure: INSERTION PORT-A-CATH;  Surgeon: CErroll Luna MD;  [facility];  Service: General;  Laterality: N/A;  60 MIN IN LDOW PLEASE   SENTINEL NODE BIOPSY Left 07/30/2020   Procedure: LEFT SENTINEL NODE BIOPSY;  Surgeon: CErroll Luna MD;  Location[facility]  Service: General;  Laterality: Left;   SIMPLE MASTECTOMY WITH AXILLARY SENTINEL NODE BIOPSY Bilateral 07/30/2020   Procedure: BILATERAL SIMPLE MASTECTOMY;  Surgeon: CErroll Luna MD;  Location[facility]  Service: General;  Laterality: Bilateral;   TUBAL LIGATION      There were no vitals filed for this visit.   Subjective Assessment - 01/03/21 1003     Subjective I am doing okay    Pertinent History Neoadjuvant chemotherapy getting through about 50% before having to stop, s/p lumpectomy [facility] 09[facility]23/[facility].  1/1 lymph nodes positive followed by ALND  on 10/11/18 with all 7 nodes negative.  Completed radiation  ER/PR/HER2 negative  Pt had fracture of proximal humerus with ORIF on 01/31/2019. " When it came back in February, I had 4 rounds of chemo and then bilateral mastectomy"  on 6/7 /2022 She did not have reconstructive surgery    Currently in Pain? No/denies                   LYMPHEDEMA/ONCOLOGY QUESTIONNAIRE - 01/03/21 0001       Left Upper Extremity Lymphedema   At Axilla  31 cm    15 cm Proximal to Olecranon Process 30.5 cm    10 cm Proximal to Olecranon Process 33 cm    Olecranon Process 27.5 cm    15 cm Proximal to Ulnar Styloid Process 25.6 cm     10 cm Proximal to Ulnar Styloid Process 23.1 cm    Just Proximal to Ulnar Styloid Process 16.7 cm    Across Hand at [employer] 18.1 cm    At [place] of 2nd Digit 6.2 cm                        [facility] Adult PT Treatment/Exercise - 01/03/21 0001       Manual Therapy   Manual Lymphatic Drainage (MLD) short neck, superficial and dep abdominals, right axillary nodes and establishment of anterior interaxillary pathway, left inguinal nodes and establishment of Lt axillo inguinal pathway, LUE working proximal to distal then retracing all steps    Compression Bandaging To Lt UE: lotion, thick stockinette, artiflex to hand, rosidal foam from wrist to axilla. 1 6 cm bandage at hand, 1 8 cm from wrist to upper arm, 1 12 cm bandage from wrist to axilla -    Passive ROM to the shoulder into flexion, abduction                       PT Short Term Goals - 12/06/20 1012       PT SHORT TERM GOAL #1   Title Pt will be independent in beginning home exercise program    Time 4    Period Weeks    Status On-going      PT SHORT TERM GOAL #2   Title Pt / family will be independent in manual lymph drainage and compression bandaging for left arm lymphedema    Time 4    Period Weeks    Status On-going               PT Long Term Goals - 12/06/20 1013       PT LONG TERM GOAL #1   Title Pt will have compression garments and possibly Flexitouch if needed to continue with long term management of lymphedema at home    Time 8    Period Weeks    Status On-going      PT LONG TERM GOAL #2   Title Pt will independent in a gradually progressive resisitve exercise program that she can continue in the community    Time 8    Period Weeks    Status On-going      PT LONG TERM GOAL #3   Title Pt will have reduction in circumference of left arm adn 10 cm proximal to olecranon by 3 cm    Baseline 35 cm on 10/11/2020. 35.5 on 12/06/2020    Time 8    Period Weeks    Status On-going  PT LONG TERM GOAL #4   Title Pt will decrease QDASH to < 10    Baseline 13.64 on 10/11/2020, 31.82 on 12/07/2020    Time 8    Period Weeks    Status On-going                   Plan - 01/03/21 1204     Clinical Impression Statement [facility] will only cover sleeve so emailed a special place to see about coverage there.  Pt has no more appts scheduled but is very ind with wrapping and flexitouch use so I will keep in contact about where to get the sleeve.    Consulted and Agree with Plan of Care Patient             Patient will benefit from skilled therapeutic intervention in order to improve the following deficits and impairments:     Visit Diagnosis: Postmastectomy lymphedema  Aftercare following surgery for neoplasm  Stiffness of left shoulder, not elsewhere classified     Problem List Patient Active Problem List   Diagnosis Date Noted   Colon cancer screening 11/12/2020   Family history of kidney cancer 10/11/2020   Recurrent breast cancer, left ([place]) 07/30/2020   Osteoporosis 04/16/2020   Closed fracture of right proximal humerus 01/23/2019   Port-A-Cath in place 05/17/2018   Genetic testing 05/10/2018   Family history of melanoma    Malignant neoplasm of upper-outer quadrant of left breast in female, estrogen receptor negative ([place]) 04/11/2018   Chest pain 05/13/2015   Shortness of breath 05/13/2015    [name], PT 01/03/2021, 12:07 PM  [facility] @ [place], [place], [address] Phone: [phone]   Fax:  [phone]  Name: TIMEKA GOETTE MRN: [id number] Date of Birth: 1960/10/30   PHYSICAL THERAPY DISCHARGE SUMMARY  Visits from Start of Care: 13  Current functional level related to goals / functional outcomes: Pt is ind with self lymphedema care including bandaging and MLD, getting day and night garments   Remaining deficits: See above   Education /  Equipment: See above  Plan: Patient agrees to discharge.  Patient goals were met.

## 2021-01-10 ENCOUNTER — Encounter: Payer: Self-pay | Admitting: Rehabilitation

## 2021-01-10 DIAGNOSIS — Z9011 Acquired absence of right breast and nipple: Secondary | ICD-10-CM | POA: Diagnosis not present

## 2021-01-10 DIAGNOSIS — C50912 Malignant neoplasm of unspecified site of left female breast: Secondary | ICD-10-CM | POA: Diagnosis not present

## 2021-01-14 DIAGNOSIS — Z9011 Acquired absence of right breast and nipple: Secondary | ICD-10-CM | POA: Diagnosis not present

## 2021-01-14 DIAGNOSIS — C50912 Malignant neoplasm of unspecified site of left female breast: Secondary | ICD-10-CM | POA: Diagnosis not present

## 2021-01-20 DIAGNOSIS — I972 Postmastectomy lymphedema syndrome: Secondary | ICD-10-CM | POA: Diagnosis not present

## 2021-01-20 DIAGNOSIS — I890 Lymphedema, not elsewhere classified: Secondary | ICD-10-CM | POA: Diagnosis not present

## 2021-01-24 DIAGNOSIS — I972 Postmastectomy lymphedema syndrome: Secondary | ICD-10-CM | POA: Diagnosis not present

## 2021-02-19 DIAGNOSIS — I890 Lymphedema, not elsewhere classified: Secondary | ICD-10-CM | POA: Diagnosis not present

## 2021-02-20 ENCOUNTER — Encounter: Payer: Self-pay | Admitting: Physician Assistant

## 2021-02-20 ENCOUNTER — Other Ambulatory Visit: Payer: Self-pay

## 2021-02-20 ENCOUNTER — Ambulatory Visit (INDEPENDENT_AMBULATORY_CARE_PROVIDER_SITE_OTHER): Payer: BC Managed Care – PPO | Admitting: Physician Assistant

## 2021-02-20 VITALS — BP 128/70 | HR 96 | Temp 97.8°F | Ht 64.0 in | Wt 156.6 lb

## 2021-02-20 DIAGNOSIS — Z23 Encounter for immunization: Secondary | ICD-10-CM | POA: Diagnosis not present

## 2021-02-20 DIAGNOSIS — Z0000 Encounter for general adult medical examination without abnormal findings: Secondary | ICD-10-CM

## 2021-02-20 DIAGNOSIS — Z1211 Encounter for screening for malignant neoplasm of colon: Secondary | ICD-10-CM

## 2021-02-20 NOTE — Progress Notes (Signed)
Subjective:  Patient ID: Kathryn [name], female    DOB: 09-18-1960  Age: 60 y.o. MRN: [id number]  Chief Complaint  Patient presents with   Annual Exam    HPI @HPI @ [facility] Physical: Patient here for a comprehensive physical exam.The patient reports no problems Do you take any herbs or supplements that were not prescribed by a doctor? no Are you taking calcium supplements? no Are you taking aspirin daily? no  Physical ("At Risk" items are starred): Patient's last physical exam was 1 year ago .  Safety: reviewed ; Patient wears a seat belt, has smoke detectors, has carbon monoxide detectors, practices appropriate gun safety, and wears sunscreen with extended sun exposure. Dental Care: biannual cleanings, brushes and flosses daily. Ophthalmology/Optometry: Annual visit.  Hearing loss: none LMP more than 10 years ago Last pap 8 years ago  Does follow with oncology for history of breast cancer -- had double mastectomy 07/2020  No flowsheet data found.   Depression screen [facility] 2/9 02/20/2021 11/12/2020  Decreased Interest 0 0  Down, Depressed, Hopeless 0 0  PHQ - 2 Score 0 0  Some recent data might be hidden       Functional Status Survey:     Health Maintenance  Topic Date Due   Pap Smear  Never done   Colon Cancer Screening  Never done   Zoster (Shingles) Vaccine (1 of 2) 05/21/2021*   Pneumococcal Vaccination (1 - PCV) 02/20/2022*   Tetanus Vaccine  02/21/2031   Flu Shot  Completed   HPV Vaccine  Aged Out   Mammogram  Discontinued   COVID-19 Vaccine  Discontinued   Hepatitis C Screening: USPSTF Recommendation to screen - Ages 18-79 yo.  Discontinued   HIV Screening  Discontinued  *Topic was postponed. The date shown is not the original due date.     Social Hx   Social History   Socioeconomic History   Marital status: Married    Spouse name: Not on file   Number of children: Not on file   Years of education: Not on file   Highest education level: Not on file   Occupational History   Not on file  Tobacco Use   Smoking status: Never   Smokeless tobacco: Never  Vaping Use   Vaping Use: Never used  Substance and Sexual Activity   Alcohol use: Not Currently    Comment: rare   Drug use: Never   Sexual activity: Not Currently    Birth control/protection: Post-menopausal  Other Topics Concern   Not on file  Social History Narrative   Not on file   Social Determinants of Health   Financial Resource Strain: Not on file  Food Insecurity: Not on file  Transportation Needs: Not on file  Physical Activity: Not on file  Stress: Not on file  Social Connections: Not on file   Past Medical History:  Diagnosis Date   Anemia    during chemo   Breast cancer ([place])    stage 3 - left   Family history of kidney cancer    Family history of melanoma    GERD (gastroesophageal reflux disease)    resolved   Headache    hormone related, none since 1st child was born    Personal history of chemotherapy    Personal history of radiation therapy    Pleurisy    Family History  Problem Relation Age of Onset   Kidney cancer Sister 32       d.  49   Lung cancer Paternal Uncle    Melanoma Sister    Melanoma Sister     Review of Systems CONSTITUTIONAL: Negative for chills, fatigue, fever, unintentional weight gain and unintentional weight loss.  E/N/T: Negative for ear pain, nasal congestion and sore throat.  CARDIOVASCULAR: Negative for chest pain, dizziness, palpitations and pedal edema.  RESPIRATORY: Negative for recent cough and dyspnea.  GASTROINTESTINAL: Negative for abdominal pain, acid reflux symptoms, constipation, diarrhea, nausea and vomiting.  MSK: Negative for arthralgias and myalgias.  INTEGUMENTARY: Negative for rash.  NEUROLOGICAL: Negative for dizziness and headaches.  PSYCHIATRIC: Negative for sleep disturbance and to question depression screen.  Negative for depression, negative for anhedonia.       Objective:  PHYSICAL EXAM:    VS: BP 128/70 (BP Location: Right Arm, Patient Position: Sitting, Cuff Size: Normal)    Pulse 96    Temp 97.8 F (36.6 C) (Temporal)    Ht 5\' 4"  (1.626 m)    Wt 156 lb 9.6 oz (71 kg)    SpO2 97%    BMI 26.88 kg/m   GEN: Well nourished, well developed, in no acute distress  HEENT: normal external ears and nose - normal external auditory canals and TMS - hearing grossly normal   Lips, Teeth and Gums - normal  Oropharynx - normal mucosa, palate, and posterior pharynx Neck: no JVD or masses - no thyromegaly Cardiac: RRR; no murmurs, rubs, or gallops,no edema - no significant varicosities Respiratory:  normal respiratory rate and pattern with no distress - normal breath sounds with no rales, rhonchi, wheezes or rubs Breasts --- scars noted from double mastectomy GI: normal bowel sounds, no masses or tenderness MS: no deformity or atrophy  Skin: warm and dry, no rash  GU - external genitalia normal - cervix pink and closed - bimanual exam norma Neuro:  Alert and Oriented x 3, Strength and sensation are intact - CN II-Xii grossly intact Psych: euthymic mood, appropriate affect and demeanor   Lab Results  Component Value Date   WBC 4.7 12/05/2020   HGB 12.5 12/05/2020   HCT 38.6 12/05/2020   PLT 229 12/05/2020   GLUCOSE 105 (H) 12/05/2020   ALT 14 12/05/2020   AST 21 12/05/2020   NA 138 12/05/2020   K 4.1 12/05/2020   CL 103 12/05/2020   CREATININE 0.67 12/05/2020   BUN 13 12/05/2020   CO2 26 12/05/2020      Assessment & Plan:   Problem List Items Addressed This Visit       Other   Colon cancer screening   Relevant Orders   Ambulatory referral to Gastroenterology   Other Visit Diagnoses     Annual physical exam    -  Primary   Relevant Orders   CBC with Differential/Platelet   Comprehensive metabolic panel   TSH   Lipid panel   Flu Vaccine MDCK QUAD PF (Completed)   Tdap vaccine greater than or equal to 7yo IM (Completed)   IGP, Aptima HPV, rfx 16/18,45         No orders of the defined types were placed in this encounter.   These are the goals we discussed:  Goals   None      This is a list of the screening recommended for you and due dates:  Health Maintenance  Topic Date Due   Pap Smear  Never done   Colon Cancer Screening  Never done   Zoster (Shingles) Vaccine (1 of 2) 05/21/2021*  Pneumococcal Vaccination (1 - PCV) 02/20/2022*   Tetanus Vaccine  02/21/2031   Flu Shot  Completed   HPV Vaccine  Aged Out   Mammogram  Discontinued   COVID-19 Vaccine  Discontinued   Hepatitis C Screening: USPSTF Recommendation to screen - Ages 18-79 yo.  Discontinued   HIV Screening  Discontinued  *Topic was postponed. The date shown is not the original due date.     AN INDIVIDUALIZED CARE PLAN: was established or reinforced today.   SELF MANAGEMENT: The patient and I together assessed ways to personally work towards obtaining the recommended goals  Support needs The patient and/or family needs were assessed and services were offered and not necessary at this time.    Follow-up: Return in about 1 year (around 02/20/2022) for wellness.  Yetta Flock [facility] [phone]

## 2021-02-21 LAB — CBC WITH DIFFERENTIAL/PLATELET
Basophils Absolute: 0 10*3/uL (ref 0.0–0.2)
Basos: 1 %
EOS (ABSOLUTE): 0.1 10*3/uL (ref 0.0–0.4)
Eos: 1 %
Hematocrit: 39.6 % (ref 34.0–46.6)
Hemoglobin: 12.9 g/dL (ref 11.1–15.9)
Immature Grans (Abs): 0 10*3/uL (ref 0.0–0.1)
Immature Granulocytes: 0 %
Lymphocytes Absolute: 1.7 10*3/uL (ref 0.7–3.1)
Lymphs: 35 %
MCH: 30.2 pg (ref 26.6–33.0)
MCHC: 32.6 g/dL (ref 31.5–35.7)
MCV: 93 fL (ref 79–97)
Monocytes Absolute: 0.4 10*3/uL (ref 0.1–0.9)
Monocytes: 9 %
Neutrophils Absolute: 2.7 10*3/uL (ref 1.4–7.0)
Neutrophils: 54 %
Platelets: 246 10*3/uL (ref 150–450)
RBC: 4.27 x10E6/uL (ref 3.77–5.28)
RDW: 12.8 % (ref 11.7–15.4)
WBC: 5 10*3/uL (ref 3.4–10.8)

## 2021-02-21 LAB — COMPREHENSIVE METABOLIC PANEL WITH GFR
ALT: 21 IU/L (ref 0–32)
AST: 24 IU/L (ref 0–40)
Albumin/Globulin Ratio: 2.1 (ref 1.2–2.2)
Albumin: 4.6 g/dL (ref 3.8–4.9)
Alkaline Phosphatase: 91 IU/L (ref 44–121)
BUN/Creatinine Ratio: 20 (ref 9–23)
BUN: 14 mg/dL (ref 6–24)
Bilirubin Total: 0.4 mg/dL (ref 0.0–1.2)
CO2: 25 mmol/L (ref 20–29)
Calcium: 9.3 mg/dL (ref 8.7–10.2)
Chloride: 102 mmol/L (ref 96–106)
Creatinine, Ser: 0.69 mg/dL (ref 0.57–1.00)
Globulin, Total: 2.2 g/dL (ref 1.5–4.5)
Glucose: 86 mg/dL (ref 70–99)
Potassium: 4.3 mmol/L (ref 3.5–5.2)
Sodium: 138 mmol/L (ref 134–144)
Total Protein: 6.8 g/dL (ref 6.0–8.5)
eGFR: 101 mL/min/1.73

## 2021-02-21 LAB — TSH: TSH: 1.31 u[IU]/mL (ref 0.450–4.500)

## 2021-02-21 LAB — LIPID PANEL
Chol/HDL Ratio: 2.9 ratio (ref 0.0–4.4)
Cholesterol, Total: 219 mg/dL — ABNORMAL HIGH (ref 100–199)
HDL: 76 mg/dL (ref >39)
LDL Chol Calc (NIH): 133 mg/dL — ABNORMAL HIGH (ref 0–99)
Triglycerides: 60 mg/dL (ref 0–149)
VLDL Cholesterol Cal: 10 mg/dL (ref 5–40)

## 2021-02-21 LAB — CARDIOVASCULAR RISK ASSESSMENT

## 2021-02-22 LAB — IGP, APTIMA HPV, RFX 16/18,45
HPV Aptima: NEGATIVE
PAP Smear Comment: 0

## 2021-04-30 ENCOUNTER — Encounter: Payer: Self-pay | Admitting: *Deleted

## 2021-05-14 ENCOUNTER — Telehealth: Payer: Self-pay | Admitting: Hematology and Oncology

## 2021-05-14 NOTE — Telephone Encounter (Signed)
Rescheduled appointment per providers template. Left message.  ? ?

## 2021-05-23 ENCOUNTER — Encounter (HOSPITAL_COMMUNITY): Payer: Self-pay | Admitting: Surgery

## 2021-05-29 NOTE — Progress Notes (Signed)
? ?Patient Care Team: ?Eliot Ford as PCP - General (Physician Assistant) ?Rockwell Germany, RN as Oncology Nurse Navigator ?Mauro Kaufmann, RN as Oncology Nurse Navigator ?Erroll Luna, MD as Consulting Physician (General Surgery) ?Magrinat, Virgie Dad, MD (Inactive) as Consulting Physician (Oncology) ?Kyung Rudd, MD as Consulting Physician (Radiation Oncology) ?Cindra Presume, MD as Consulting Physician (Plastic Surgery) ? ?DIAGNOSIS:  ?Encounter Diagnosis  ?Name Primary?  ? Malignant neoplasm of upper-outer quadrant of left breast in female, estrogen receptor negative ([place]) Yes  ? ? ?SUMMARY OF ONCOLOGIC HISTORY: ?Oncology History  ?Malignant neoplasm of upper-outer quadrant of left breast in female, estrogen receptor negative ([place])  ?04/11/2018 Initial Diagnosis  ? Malignant neoplasm of upper-outer quadrant of left breast in female, estrogen receptor negative ([place]) ? ?  ?05/03/2018 - 08/09/2018 Chemotherapy  ? The patient had dexamethasone (DECADRON) 4 MG tablet, 1 of 1 cycle, Start date: 06/29/2018, End date: 07/26/2018 ?DOXOrubicin (ADRIAMYCIN) chemo injection 108 mg, 60 mg/m2 = 108 mg, Intravenous,  Once, 4 of 4 cycles ?Dose modification: 50 mg/m2 (original dose 60 mg/m2, Cycle 3, Reason: Provider Judgment), 50 mg/m2 (original dose 60 mg/m2, Cycle 4, Reason: Provider Judgment) ?Administration: 108 mg (05/03/2018), 108 mg (05/17/2018), 90 mg (06/07/2018), 90 mg (06/28/2018) ?palonosetron (ALOXI) injection 0.25 mg, 0.25 mg, Intravenous,  Once, 1 of 1 cycle ?Administration: 0.25 mg (05/03/2018) ?pegfilgrastim (NEULASTA ONPRO KIT) injection 6 mg, 6 mg, Subcutaneous, Once, 1 of 1 cycle ?Administration: 6 mg (06/28/2018) ?pegfilgrastim-cbqv (UDENYCA) injection 6 mg, 6 mg, Subcutaneous, Once, 3 of 3 cycles ?Administration: 6 mg (05/05/2018), 6 mg (05/19/2018), 6 mg (06/09/2018) ?CARBOplatin (PARAPLATIN) 230 mg in sodium chloride 0.9 % 250 mL chemo infusion, 230 mg (11500 % of original dose 2 mg), Intravenous,  Once, 2 of 4  cycles ?Dose modification: 2 mg (original dose 2 mg, Cycle 5),   (original dose 2 mg, Cycle 5, Reason: Provider Judgment),   (original dose 2 mg, Cycle 5),   (original dose 230 mg, Cycle 5) ?Administration: 230 mg (07/20/2018), 230 mg (07/26/2018), 230 mg (08/02/2018), 230 mg (08/09/2018) ?cyclophosphamide (CYTOXAN) 1,080 mg in sodium chloride 0.9 % 250 mL chemo infusion, 600 mg/m2 = 1,080 mg, Intravenous,  Once, 4 of 4 cycles ?Dose modification: 500 mg/m2 (original dose 600 mg/m2, Cycle 3, Reason: Provider Judgment), 500 mg/m2 (original dose 600 mg/m2, Cycle 4, Reason: Provider Judgment) ?Administration: 1,080 mg (05/03/2018), 1,080 mg (05/17/2018), 900 mg (06/07/2018), 900 mg (06/28/2018) ?PACLitaxel (TAXOL) 144 mg in sodium chloride 0.9 % 250 mL chemo infusion (</= 18m/m2), 80 mg/m2 = 144 mg, Intravenous,  Once, 2 of 4 cycles ?Administration: 144 mg (07/20/2018), 144 mg (07/26/2018), 144 mg (08/09/2018), 144 mg (08/02/2018) ?fosaprepitant (EMEND) 150 mg, dexamethasone (DECADRON) 12 mg in sodium chloride 0.9 % 145 mL IVPB, , Intravenous,  Once, 6 of 8 cycles ?Administration:  (05/03/2018),  (07/20/2018),  (05/17/2018),  (06/07/2018),  (06/28/2018),  (08/09/2018),  (07/26/2018),  (08/02/2018) ? ? for chemotherapy treatment.  ? ?  ?04/09/2020 Cancer Staging  ? Staging form: Breast, AJCC 8th Edition ?- Clinical stage from 04/09/2020: Stage IB (rcT1c, cN0, cM0, G3, ER-, PR-, HER2-) - Signed by CGardenia Phlegm NP on 04/17/2020 ?Stage prefix: Recurrence ? ?  ?04/30/2020 - 06/13/2020 Chemotherapy  ?  ? ?  ? ?  ?07/02/2020 - 07/02/2020 Chemotherapy  ? Patient is on Treatment Plan : BREAST Adjuvant CMF IV q21d  ? ?  ?  ? ? ?CHIEF COMPLIANT: Surveillance and follow-up on breast cancer ? ?INTERVAL HISTORY: Kathryn LEVENTHALis a  61 y.o with above mentioned history of Recurrent triple negative breast cancer (s/p bilateral mastectomies). She presents to the clinic for surveillance and follow up. She complains of a pain in her side every once in a while.   Pain is nonspecific and it is mild to moderate in intensity.  Denies any constipation or nausea or vomiting. ? ? ?ALLERGIES:  has No Known Allergies. ? ?MEDICATIONS:  ?Current Outpatient Medications  ?Medication Sig Dispense Refill  ? cholecalciferol (VITAMIN D3) 25 MCG (1000 UNIT) tablet Take 1,000 Units by mouth in the morning.    ? gabapentin (NEURONTIN) 100 MG capsule Take 1 capsule (100 mg total) by mouth at bedtime. 90 capsule 4  ? loratadine (CLARITIN) 10 MG tablet Take 1 tablet (10 mg total) by mouth in the morning. 90 tablet 0  ? ?No current facility-administered medications for this visit.  ? ? ?PHYSICAL EXAMINATION: ?ECOG PERFORMANCE STATUS: 1 - Symptomatic but completely ambulatory ? ?Vitals:  ? 06/12/21 1035  ?BP: 138/85  ?Pulse: 88  ?Resp: 18  ?Temp: 97.8 ?F (36.6 ?C)  ?SpO2: 100%  ? ?Filed Weights  ? 06/12/21 1035  ?Weight: 151 lb 8 oz (68.7 kg)  ? ? ?BREAST: Bilateral mastectomies.  No palpable lumps or nodules (exam performed in the presence of a chaperone) ? ?LABORATORY DATA:  ?I have reviewed the data as listed ? ?  Latest Ref Rng & Units 02/20/2021  ? 10:46 AM 12/05/2020  ? 12:13 PM 10/10/2020  ?  3:19 PM  ?CMP  ?Glucose 70 - 99 mg/dL 86   105   93    ?BUN 6 - 24 mg/dL _0 ?Creatinine 0.57 - 1.00 mg/dL 0.69   0.67   0.69    ?Sodium 134 - 144 mmol/L 138   138   138    ?Potassium 3.5 - 5.2 mmol/L 4.3   4.1   4.0    ?Chloride 96 - 106 mmol/L 102   103   106    ?CO2 20 - 29 mmol/L _1 ?Calcium 8.7 - 10.2 mg/dL 9.3   9.3   9.1    ?Total Protein 6.0 - 8.5 g/dL 6.8   7.7   6.9    ?Total Bilirubin 0.0 - 1.2 mg/dL 0.4   0.4   0.4    ?Alkaline Phos 44 - 121 IU/L 91   77   77    ?AST 0 - 40 IU/L _2 ?ALT 0 - 32 IU/L _3 ? ? ?Lab Results  ?Component Value Date  ? WBC 5.1 06/12/2021  ? HGB 13.0 06/12/2021  ? HCT 40.4 06/12/2021  ? MCV 93.3 06/12/2021  ? PLT 189 06/12/2021  ? NEUTROABS 2.9 06/12/2021  ? ? ?ASSESSMENT & PLAN:  ?Malignant neoplasm of upper-outer  quadrant of left breast in female, estrogen receptor negative ([place]) ?04/06/2018: T2N2 stage IIIc grade 3 IDC triple negative Ki-67 70% (5 cm satellite nodules, 3 abnormal lymph nodes) ?05/03/2018-08/09/2018: Neoadjuvant Adriamycin and Cytoxan followed by Taxol and carboplatin x4 (stopped because of neuropathy) ?09/23/2018: Left lumpectomy: Residual grade 2 IDC triple negative, 1/1 lymph node positive, axillary dissection 10/11/2018 showed 7 negative lymph nodes ?12/14/2018-01/31/2019: Adjuvant radiation with capecitabine, followed by capecitabine maintenance completed 08/04/2019 ?Genetics: Negative for mutations ?04/09/2020: Breast cancer recurrence: Multifocal grade 3 IDC 1.6 cm and 0.4  cm Ki-67 60% (bone scan and CT CAP: No distant mets) ?04/30/2020-07/02/2020: TC x4 cycles ?07/30/2020: Bilateral mastectomies: Right: Benign, left: Residual T1 microscopic IDC grade 2, margins negative, no reconstruction ? ?Current treatment: Surveillance ?Abdominal pain: Intermittent in nature: I would like to obtain CT chest abdomen pelvis because of her history of recurrent breast cancer. ?We will call her with the results of the scans after the scan gets done. ? ?Return to clinic in 6 months with labs and follow-up. ?After that we can see her annually. ? ? ?Orders Placed This Encounter  ?Procedures  ? CT CHEST ABDOMEN PELVIS W CONTRAST  ?  Standing Status:   Future  ?  Standing Expiration Date:   06/13/2022  ?  Order Specific Question:   Is patient pregnant?  ?  Answer:   No  ?  Order Specific Question:   Preferred imaging location?  ?  Answer:   [facility]  ?  Order Specific Question:   Release to patient  ?  Answer:   Immediate  ?  Order Specific Question:   Is Oral Contrast requested for this exam?  ?  Answer:   Yes, Per Radiology protocol  ? CBC with Differential ([place] Only)  ?  Standing Status:   Future  ?  Standing Expiration Date:   06/13/2022  ? CMP ([place] only)  ?  Standing Status:   Future  ?  Standing  Expiration Date:   06/13/2022  ? ?The patient has a good understanding of the overall plan. she agrees with it. she will call with any problems that may develop before the next visit here. ?Total time spent

## 2021-06-05 ENCOUNTER — Ambulatory Visit: Payer: BC Managed Care – PPO | Admitting: Hematology and Oncology

## 2021-06-05 ENCOUNTER — Ambulatory Visit: Payer: BC Managed Care – PPO

## 2021-06-05 ENCOUNTER — Other Ambulatory Visit: Payer: BC Managed Care – PPO

## 2021-06-10 ENCOUNTER — Other Ambulatory Visit: Payer: Self-pay | Admitting: *Deleted

## 2021-06-10 DIAGNOSIS — Z171 Estrogen receptor negative status [ER-]: Secondary | ICD-10-CM

## 2021-06-12 ENCOUNTER — Other Ambulatory Visit: Payer: Self-pay | Admitting: Hematology and Oncology

## 2021-06-12 ENCOUNTER — Other Ambulatory Visit: Payer: Self-pay

## 2021-06-12 ENCOUNTER — Inpatient Hospital Stay: Payer: BC Managed Care – PPO

## 2021-06-12 ENCOUNTER — Inpatient Hospital Stay: Payer: BC Managed Care – PPO | Admitting: Hematology and Oncology

## 2021-06-12 ENCOUNTER — Inpatient Hospital Stay: Payer: BC Managed Care – PPO | Attending: Hematology and Oncology

## 2021-06-12 VITALS — BP 138/85 | HR 88 | Temp 97.8°F | Resp 18 | Ht 64.0 in | Wt 151.5 lb

## 2021-06-12 DIAGNOSIS — C50412 Malignant neoplasm of upper-outer quadrant of left female breast: Secondary | ICD-10-CM

## 2021-06-12 DIAGNOSIS — C50912 Malignant neoplasm of unspecified site of left female breast: Secondary | ICD-10-CM

## 2021-06-12 DIAGNOSIS — Z171 Estrogen receptor negative status [ER-]: Secondary | ICD-10-CM | POA: Insufficient documentation

## 2021-06-12 DIAGNOSIS — Z95828 Presence of other vascular implants and grafts: Secondary | ICD-10-CM

## 2021-06-12 LAB — CMP (CANCER CENTER ONLY)
ALT: 13 U/L (ref 0–44)
AST: 16 U/L (ref 15–41)
Albumin: 4.3 g/dL (ref 3.5–5.0)
Alkaline Phosphatase: 72 U/L (ref 38–126)
Anion gap: 5 (ref 5–15)
BUN: 16 mg/dL (ref 6–20)
CO2: 27 mmol/L (ref 22–32)
Calcium: 8.9 mg/dL (ref 8.9–10.3)
Chloride: 105 mmol/L (ref 98–111)
Creatinine: 0.67 mg/dL (ref 0.44–1.00)
GFR, Estimated: >60 mL/min (ref >60)
Glucose, Bld: 90 mg/dL (ref 70–99)
Potassium: 4.3 mmol/L (ref 3.5–5.1)
Sodium: 137 mmol/L (ref 135–145)
Total Bilirubin: 0.6 mg/dL (ref 0.3–1.2)
Total Protein: 7.4 g/dL (ref 6.5–8.1)

## 2021-06-12 LAB — CBC WITH DIFFERENTIAL (CANCER CENTER ONLY)
Abs Immature Granulocytes: 0.01 10*3/uL (ref 0.00–0.07)
Basophils Absolute: 0 10*3/uL (ref 0.0–0.1)
Basophils Relative: 1 %
Eosinophils Absolute: 0.1 10*3/uL (ref 0.0–0.5)
Eosinophils Relative: 2 %
HCT: 40.4 % (ref 36.0–46.0)
Hemoglobin: 13 g/dL (ref 12.0–15.0)
Immature Granulocytes: 0 %
Lymphocytes Relative: 32 %
Lymphs Abs: 1.6 10*3/uL (ref 0.7–4.0)
MCH: 30 pg (ref 26.0–34.0)
MCHC: 32.2 g/dL (ref 30.0–36.0)
MCV: 93.3 fL (ref 80.0–100.0)
Monocytes Absolute: 0.4 10*3/uL (ref 0.1–1.0)
Monocytes Relative: 8 %
Neutro Abs: 2.9 10*3/uL (ref 1.7–7.7)
Neutrophils Relative %: 57 %
Platelet Count: 189 10*3/uL (ref 150–400)
RBC: 4.33 MIL/uL (ref 3.87–5.11)
RDW: 13.5 % (ref 11.5–15.5)
WBC Count: 5.1 10*3/uL (ref 4.0–10.5)
nRBC: 0 % (ref 0.0–0.2)

## 2021-06-12 MED ORDER — ZOLEDRONIC ACID 4 MG/100ML IV SOLN
4.0000 mg | Freq: Once | INTRAVENOUS | Status: AC
  Administered 2021-06-12: 4 mg via INTRAVENOUS
  Filled 2021-06-12: qty 100

## 2021-06-12 MED ORDER — SODIUM CHLORIDE 0.9 % IV SOLN: INTRAVENOUS | Status: DC

## 2021-06-12 NOTE — Patient Instructions (Signed)

## 2021-06-12 NOTE — Assessment & Plan Note (Signed)
04/06/2018: T2N2 stage IIIc grade 3 IDC triple negative Ki-67 70% (5 cm satellite nodules, 3 abnormal lymph nodes) ?05/03/2018-08/09/2018: Neoadjuvant Adriamycin and Cytoxan followed by Taxol and carboplatin x4 (stopped because of neuropathy) ?09/23/2018: Left lumpectomy: Residual grade 2 IDC triple negative, 1/1 lymph node positive, axillary dissection 10/11/2018 showed 7 negative lymph nodes ?12/14/2018-01/31/2019: Adjuvant radiation with capecitabine, followed by capecitabine maintenance completed 08/04/2019 ?Genetics: Negative for mutations ?04/09/2020: Breast cancer recurrence: Multifocal grade 3 IDC 1.6 cm and 0.4 cm Ki-67 60% (bone scan and CT CAP: No distant mets) ?04/30/2020-07/02/2020: TC x4 cycles ?07/30/2020: Bilateral mastectomies: Right: Benign, left: Residual T1 microscopic IDC grade 2, margins negative, no reconstruction ? ?Current treatment: Surveillance ? ?

## 2021-06-19 ENCOUNTER — Ambulatory Visit (HOSPITAL_COMMUNITY): Payer: BC Managed Care – PPO

## 2021-06-19 ENCOUNTER — Ambulatory Visit (HOSPITAL_COMMUNITY)
Admission: RE | Admit: 2021-06-19 | Discharge: 2021-06-19 | Disposition: A | Discharge status: Home / Self Care | Payer: BC Managed Care – PPO | Source: Ambulatory Visit | Attending: Hematology and Oncology | Admitting: Hematology and Oncology

## 2021-06-19 DIAGNOSIS — Z171 Estrogen receptor negative status [ER-]: Secondary | ICD-10-CM | POA: Diagnosis not present

## 2021-06-19 DIAGNOSIS — Z9012 Acquired absence of left breast and nipple: Secondary | ICD-10-CM | POA: Diagnosis not present

## 2021-06-19 DIAGNOSIS — C50412 Malignant neoplasm of upper-outer quadrant of left female breast: Secondary | ICD-10-CM | POA: Insufficient documentation

## 2021-06-19 DIAGNOSIS — K449 Diaphragmatic hernia without obstruction or gangrene: Secondary | ICD-10-CM | POA: Diagnosis not present

## 2021-06-19 MED ORDER — SODIUM CHLORIDE (PF) 0.9 % IJ SOLN
INTRAMUSCULAR | Status: AC
  Filled 2021-06-19: qty 50

## 2021-06-19 MED ORDER — IOHEXOL 300 MG/ML  SOLN
100.0000 mL | Freq: Once | INTRAMUSCULAR | Status: AC | PRN
  Administered 2021-06-19: 100 mL via INTRAVENOUS

## 2021-06-20 ENCOUNTER — Inpatient Hospital Stay (HOSPITAL_BASED_OUTPATIENT_CLINIC_OR_DEPARTMENT_OTHER): Payer: BC Managed Care – PPO | Admitting: Hematology and Oncology

## 2021-06-20 DIAGNOSIS — C50412 Malignant neoplasm of upper-outer quadrant of left female breast: Secondary | ICD-10-CM | POA: Diagnosis not present

## 2021-06-20 DIAGNOSIS — Z171 Estrogen receptor negative status [ER-]: Secondary | ICD-10-CM | POA: Diagnosis not present

## 2021-06-20 MED ORDER — LORATADINE 10 MG PO TABS: 10.0000 mg | ORAL_TABLET | Freq: Every morning | ORAL | 3 refills | Status: DC

## 2021-06-20 NOTE — Assessment & Plan Note (Addendum)
04/06/2018: T2N2 stage IIIc grade 3 IDC triple negative Ki-67 70% (5 cm satellite nodules, 3 abnormal lymph nodes) ?05/03/2018-08/09/2018: Neoadjuvant Adriamycin and Cytoxan followed by Taxol and carboplatin x4 (stopped because of neuropathy) ?09/23/2018: Left lumpectomy: Residual grade 2 IDC triple negative, 1/1 lymph node positive, axillary dissection 10/11/2018 showed 7 negative lymph nodes ?12/14/2018-01/31/2019: Adjuvant radiation with capecitabine, followed by capecitabine maintenance completed 08/04/2019 ?Genetics: Negative for mutations ?04/09/2020: Breast cancer recurrence: Multifocal grade 3 IDC 1.6 cm and 0.4 cm Ki-67 60% (bone scan and CT CAP: No distant mets) ?04/30/2020-07/02/2020: TC x4 cycles ?07/30/2020: Bilateral mastectomies: Right: Benign, left: Residual T1 microscopic IDC grade 2, margins negative, no reconstruction ?? ?Current treatment: Surveillance ?Abdominal pain: CT chest abdomen pelvis 06/19/2021: Benign ?Return to clinic in 1 year for follow-up ?

## 2021-06-20 NOTE — Progress Notes (Signed)
? ?Patient Care Team: ?Eliot Ford as PCP - General (Physician Assistant) ?Rockwell Germany, RN as Oncology Nurse Navigator ?Mauro Kaufmann, RN as Oncology Nurse Navigator ?Erroll Luna, MD as Consulting Physician (General Surgery) ?Magrinat, Virgie Dad, MD (Inactive) as Consulting Physician (Oncology) ?Kyung Rudd, MD as Consulting Physician (Radiation Oncology) ?Cindra Presume, MD as Consulting Physician (Plastic Surgery) ? ?DIAGNOSIS:  ?Encounter Diagnosis  ?Name Primary?  ? Malignant neoplasm of upper-outer quadrant of left breast in female, estrogen receptor negative ([place])   ? ? ?SUMMARY OF ONCOLOGIC HISTORY: ?Oncology History  ?Malignant neoplasm of upper-outer quadrant of left breast in female, estrogen receptor negative ([place])  ?04/11/2018 Initial Diagnosis  ? Malignant neoplasm of upper-outer quadrant of left breast in female, estrogen receptor negative ([place]) ? ?  ?05/03/2018 - 08/09/2018 Chemotherapy  ? The patient had dexamethasone (DECADRON) 4 MG tablet, 1 of 1 cycle, Start date: 06/29/2018, End date: 07/26/2018 ?DOXOrubicin (ADRIAMYCIN) chemo injection 108 mg, 60 mg/m2 = 108 mg, Intravenous,  Once, 4 of 4 cycles ?Dose modification: 50 mg/m2 (original dose 60 mg/m2, Cycle 3, Reason: Provider Judgment), 50 mg/m2 (original dose 60 mg/m2, Cycle 4, Reason: Provider Judgment) ?Administration: 108 mg (05/03/2018), 108 mg (05/17/2018), 90 mg (06/07/2018), 90 mg (06/28/2018) ?palonosetron (ALOXI) injection 0.25 mg, 0.25 mg, Intravenous,  Once, 1 of 1 cycle ?Administration: 0.25 mg (05/03/2018) ?pegfilgrastim (NEULASTA ONPRO KIT) injection 6 mg, 6 mg, Subcutaneous, Once, 1 of 1 cycle ?Administration: 6 mg (06/28/2018) ?pegfilgrastim-cbqv (UDENYCA) injection 6 mg, 6 mg, Subcutaneous, Once, 3 of 3 cycles ?Administration: 6 mg (05/05/2018), 6 mg (05/19/2018), 6 mg (06/09/2018) ?CARBOplatin (PARAPLATIN) 230 mg in sodium chloride 0.9 % 250 mL chemo infusion, 230 mg (11500 % of original dose 2 mg), Intravenous,  Once, 2 of 4  cycles ?Dose modification: 2 mg (original dose 2 mg, Cycle 5),   (original dose 2 mg, Cycle 5, Reason: Provider Judgment),   (original dose 2 mg, Cycle 5),   (original dose 230 mg, Cycle 5) ?Administration: 230 mg (07/20/2018), 230 mg (07/26/2018), 230 mg (08/02/2018), 230 mg (08/09/2018) ?cyclophosphamide (CYTOXAN) 1,080 mg in sodium chloride 0.9 % 250 mL chemo infusion, 600 mg/m2 = 1,080 mg, Intravenous,  Once, 4 of 4 cycles ?Dose modification: 500 mg/m2 (original dose 600 mg/m2, Cycle 3, Reason: Provider Judgment), 500 mg/m2 (original dose 600 mg/m2, Cycle 4, Reason: Provider Judgment) ?Administration: 1,080 mg (05/03/2018), 1,080 mg (05/17/2018), 900 mg (06/07/2018), 900 mg (06/28/2018) ?PACLitaxel (TAXOL) 144 mg in sodium chloride 0.9 % 250 mL chemo infusion (</= 36m/m2), 80 mg/m2 = 144 mg, Intravenous,  Once, 2 of 4 cycles ?Administration: 144 mg (07/20/2018), 144 mg (07/26/2018), 144 mg (08/09/2018), 144 mg (08/02/2018) ?fosaprepitant (EMEND) 150 mg, dexamethasone (DECADRON) 12 mg in sodium chloride 0.9 % 145 mL IVPB, , Intravenous,  Once, 6 of 8 cycles ?Administration:  (05/03/2018),  (07/20/2018),  (05/17/2018),  (06/07/2018),  (06/28/2018),  (08/09/2018),  (07/26/2018),  (08/02/2018) ? ? for chemotherapy treatment.  ? ?  ?04/09/2020 Cancer Staging  ? Staging form: Breast, AJCC 8th Edition ?- Clinical stage from 04/09/2020: Stage IB (rcT1c, cN0, cM0, G3, ER-, PR-, HER2-) - Signed by CGardenia Phlegm NP on 04/17/2020 ?Stage prefix: Recurrence ? ?  ?04/30/2020 - 06/13/2020 Chemotherapy  ?  ? ?  ? ?  ?07/02/2020 - 07/02/2020 Chemotherapy  ? Patient is on Treatment Plan : BREAST Adjuvant CMF IV q21d  ? ?  ?  ? ? ?CHIEF COMPLIANT: Follow-up to discuss her recent CT scans ? ?INTERVAL HISTORY: RGRACLYN LAWTHERis  a 61 year old with above-mentioned history of breast cancer was treated by Dr. Jana Hakim with surgery followed by adjuvant chemotherapy and I saw her for follow-up and recommended doing a CT chest abdomen pelvis for staging and to  evaluate her complaints of bone pain as well as abdominal pain.  The CT scans did not reveal any evidence of metastatic disease.  Other than allergies she has been doing quite well.  She does have some intermittent right hip pain that is ongoing. ? ? ?ALLERGIES:  has No Known Allergies. ? ?MEDICATIONS:  ?Current Outpatient Medications  ?Medication Sig Dispense Refill  ? cholecalciferol (VITAMIN D3) 25 MCG (1000 UNIT) tablet Take 1,000 Units by mouth in the morning.    ? gabapentin (NEURONTIN) 100 MG capsule Take 1 capsule (100 mg total) by mouth at bedtime. 90 capsule 4  ? loratadine (CLARITIN) 10 MG tablet Take 1 tablet (10 mg total) by mouth in the morning. 90 tablet 3  ? ?No current facility-administered medications for this visit.  ? ? ?PHYSICAL EXAMINATION: ?ECOG PERFORMANCE STATUS: 1 - Symptomatic but completely ambulatory ? ?There were no vitals filed for this visit. ?There were no vitals filed for this visit. ?  ? ?LABORATORY DATA:  ?I have reviewed the data as listed ? ?  Latest Ref Rng & Units 06/12/2021  ? 10:29 AM 02/20/2021  ? 10:46 AM 12/05/2020  ? 12:13 PM  ?CMP  ?Glucose 70 - 99 mg/dL 90   86   105    ?BUN 6 - 20 mg/dL '16   14   13    ' ?Creatinine 0.44 - 1.00 mg/dL 0.67   0.69   0.67    ?Sodium 135 - 145 mmol/L 137   138   138    ?Potassium 3.5 - 5.1 mmol/L 4.3   4.3   4.1    ?Chloride 98 - 111 mmol/L 105   102   103    ?CO2 22 - 32 mmol/L '27   25   26    ' ?Calcium 8.9 - 10.3 mg/dL 8.9   9.3   9.3    ?Total Protein 6.5 - 8.1 g/dL 7.4   6.8   7.7    ?Total Bilirubin 0.3 - 1.2 mg/dL 0.6   0.4   0.4    ?Alkaline Phos 38 - 126 U/L 72   91   77    ?AST 15 - 41 U/L '16   24   21    ' ?ALT 0 - 44 U/L '13   21   14    ' ? ? ?Lab Results  ?Component Value Date  ? WBC 5.1 06/12/2021  ? HGB 13.0 06/12/2021  ? HCT 40.4 06/12/2021  ? MCV 93.3 06/12/2021  ? PLT 189 06/12/2021  ? NEUTROABS 2.9 06/12/2021  ? ? ?ASSESSMENT & PLAN:  ?Malignant neoplasm of upper-outer quadrant of left breast in female, estrogen receptor  negative ([place]) ?04/06/2018: T2N2 stage IIIc grade 3 IDC triple negative Ki-67 70% (5 cm satellite nodules, 3 abnormal lymph nodes) ?05/03/2018-08/09/2018: Neoadjuvant Adriamycin and Cytoxan followed by Taxol and carboplatin x4 (stopped because of neuropathy) ?09/23/2018: Left lumpectomy: Residual grade 2 IDC triple negative, 1/1 lymph node positive, axillary dissection 10/11/2018 showed 7 negative lymph nodes ?12/14/2018-01/31/2019: Adjuvant radiation with capecitabine, followed by capecitabine maintenance completed 08/04/2019 ?Genetics: Negative for mutations ?04/09/2020: Breast cancer recurrence: Multifocal grade 3 IDC 1.6 cm and 0.4 cm Ki-67 60% (bone scan and CT CAP: No distant mets) ?04/30/2020-07/02/2020: TC x4 cycles ?07/30/2020: Bilateral mastectomies:  Right: Benign, left: Residual T1 microscopic IDC grade 2, margins negative, no reconstruction ?  ?Current treatment: Surveillance ?Abdominal pain: CT chest abdomen pelvis 06/19/2021: Benign ?Return to clinic in 1 year for follow-up ? ?No orders of the defined types were placed in this encounter. ? ?The patient has a good understanding of the overall plan. she agrees with it. she will call with any problems that may develop before the next visit here. ?Total time spent: 30 mins including face to face time and time spent for planning, charting and co-ordination of care ? ? Harriette Ohara, MD ?06/20/21 ? ? ? ?

## 2021-07-16 ENCOUNTER — Telehealth: Payer: BC Managed Care – PPO | Admitting: Physician Assistant

## 2021-07-16 ENCOUNTER — Encounter: Payer: Self-pay | Admitting: Nurse Practitioner

## 2021-07-16 ENCOUNTER — Ambulatory Visit: Payer: BC Managed Care – PPO | Admitting: Nurse Practitioner

## 2021-07-16 VITALS — BP 108/70 | HR 111 | Temp 97.6°F | Ht 64.0 in | Wt 146.0 lb

## 2021-07-16 DIAGNOSIS — J3089 Other allergic rhinitis: Secondary | ICD-10-CM | POA: Diagnosis not present

## 2021-07-16 DIAGNOSIS — J018 Other acute sinusitis: Secondary | ICD-10-CM | POA: Diagnosis not present

## 2021-07-16 DIAGNOSIS — Z91199 Patient's noncompliance with other medical treatment and regimen due to unspecified reason: Secondary | ICD-10-CM

## 2021-07-16 MED ORDER — FLUTICASONE PROPIONATE 50 MCG/ACT NA SUSP: 2.0000 | Freq: Every day | NASAL | 6 refills | Status: DC

## 2021-07-16 MED ORDER — AZITHROMYCIN 250 MG PO TABS: ORAL_TABLET | ORAL | 0 refills | Status: AC

## 2021-07-16 NOTE — Patient Instructions (Addendum)
Use Flonase nasal spray daily Take Z-pack as directed Rest and push fluids Follow-up as needed   Sinus Infection, Adult A sinus infection is soreness and swelling (inflammation) of your sinuses. Sinuses are hollow spaces in the bones around your face. They are located: Around your eyes. In the middle of your forehead. Behind your nose. In your cheekbones. Your sinuses and nasal passages are lined with a fluid called mucus. Mucus drains out of your sinuses. Swelling can trap mucus in your sinuses. This lets germs (bacteria, virus, or fungus) grow, which leads to infection. Most of the time, this condition is caused by a virus. What are the causes? Allergies. Asthma. Germs. Things that block your nose or sinuses. Growths in the nose (nasal polyps). Chemicals or irritants in the air. A fungus. This is rare. What increases the risk? Having a weak body defense system (immune system). Doing a lot of swimming or diving. Using nasal sprays too much. Smoking. What are the signs or symptoms? The main symptoms of this condition are pain and a feeling of pressure around the sinuses. Other symptoms include: Stuffy nose (congestion). This may make it hard to breathe through your nose. Runny nose (drainage). Soreness, swelling, and warmth in the sinuses. A cough that may get worse at night. Being unable to smell and taste. Mucus that collects in the throat or the back of the nose (postnasal drip). This may cause a sore throat or bad breath. Being very tired (fatigued). A fever. How is this diagnosed? Your symptoms. Your medical history. A physical exam. Tests to find out if your condition is short-term (acute) or long-term (chronic). Your doctor may: Check your nose for growths (polyps). Check your sinuses using a tool that has a light on one end (endoscope). Check for allergies or germs. Do imaging tests, such as an MRI or CT scan. How is this treated? Treatment for this condition  depends on the cause and whether it is short-term or long-term. If caused by a virus, your symptoms should go away on their own within 10 days. You may be given medicines to relieve symptoms. They include: Medicines that shrink swollen tissue in the nose. A spray that treats swelling of the nostrils. Rinses that help get rid of thick mucus in your nose (nasal saline washes). Medicines that treat allergies (antihistamines). Over-the-counter pain relievers. If caused by bacteria, your doctor may wait to see if you will get better without treatment. You may be given antibiotic medicine if you have: A very bad infection. A weak body defense system. If caused by growths in the nose, surgery may be needed. Follow these instructions at home: Medicines Take, use, or apply over-the-counter and prescription medicines only as told by your doctor. These may include nasal sprays. If you were prescribed an antibiotic medicine, take it as told by your doctor. Do not stop taking it even if you start to feel better. Hydrate and humidify  Drink enough water to keep your pee (urine) pale yellow. Use a cool mist humidifier to keep the humidity level in your home above 50%. Breathe in steam for 10-15 minutes, 3-4 times a day, or as told by your doctor. You can do this in the bathroom while a hot shower is running. Try not to spend time in cool or dry air. Rest Rest as much as you can. Sleep with your head raised (elevated). Make sure you get enough sleep each night. General instructions  Put a warm, moist washcloth on your face 3-4  times a day, or as often as told by your doctor. Use nasal saline washes as often as told by your doctor. Wash your hands often with soap and water. If you cannot use soap and water, use hand sanitizer. Do not smoke. Avoid being around people who are smoking (secondhand smoke). Keep all follow-up visits. Contact a doctor if: You have a fever. Your symptoms get worse. Your  symptoms do not get better within 10 days. Get help right away if: You have a very bad headache. You cannot stop vomiting. You have very bad pain or swelling around your face or eyes. You have trouble seeing. You feel confused. Your neck is stiff. You have trouble breathing. These symptoms may be an emergency. Get help right away. Call 911. Do not wait to see if the symptoms will go away. Do not drive yourself to the hospital. Summary A sinus infection is swelling of your sinuses. Sinuses are hollow spaces in the bones around your face. This condition is caused by tissues in your nose that become inflamed or swollen. This traps germs. These can lead to infection. If you were prescribed an antibiotic medicine, take it as told by your doctor. Do not stop taking it even if you start to feel better. Keep all follow-up visits. This information is not intended to replace advice given to you by your health care provider. Make sure you discuss any questions you have with your health care provider. Document Revised: 01/14/2021 Document Reviewed: 01/14/2021 Elsevier Patient Education  [place].

## 2021-07-16 NOTE — Progress Notes (Signed)
The patient no-showed for appointment despite this provider sending direct link, reaching out via phone with no response and waiting for at least 10 minutes from appointment time for patient to join. They will be marked as a NS for this appointment/time.  ? ?Tonjua Rossetti Cody Dia Jefferys, PA-C ? ? ? ?

## 2021-07-16 NOTE — Progress Notes (Signed)
Acute Office Visit  Subjective:    Patient ID: Kathryn [name], female    DOB: [birth year], 61 y.o.   MRN: [id number]  CC: URI  HPI: Patient is in today for Upper respiratory symptoms She complains of low-grade fever (100.48F), headache, post nasal drip, productive cough, and sore throat. Onset of symptoms was a few days ago. Treatment has included Tylenol and Claritin. Home COVID-19 test negative.  Past history is significant for breast cancer and chronic allergic rhinitis.    Past Medical History:  Diagnosis Date   Anemia    during chemo   Breast cancer ([place])    stage 3 - left   Family history of kidney cancer    Family history of melanoma    GERD (gastroesophageal reflux disease)    resolved   Headache    hormone related, none since 1st child was born    Personal history of chemotherapy    Personal history of radiation therapy    Pleurisy     Past Surgical History:  Procedure Laterality Date   AXILLARY LYMPH NODE DISSECTION Left 10/11/2018   Procedure: LEFT AXILLARY LYMPH NODE DISSECTION;  Surgeon: Erroll Luna, MD;  Location: [facility];  Service: General;  Laterality: Left;   BREAST LUMPECTOMY Left 08/2018   BREAST LUMPECTOMY WITH RADIOACTIVE SEED AND SENTINEL LYMPH NODE BIOPSY Left 09/23/2018   Procedure: LEFT BREAST LUMPECTOMY WITH RADIOACTIVE SEED AND LEFT AXILLARY TARGETED LYMPH NODE BIOPY AND  LEFT AXILLARY SENTINEL LYMPH NODE [place];  Surgeon: Erroll Luna, MD;  Location: [place];  Service: General;  Laterality: Left;   CESAREAN SECTION     x3   FRACTURE SURGERY  2020   humerus - w/ Metta Clines. Supple, MD   ORIF HUMERUS FRACTURE Right 01/31/2019   Procedure: OPEN REDUCTION INTERNAL FIXATION (ORIF) Right 3 part proximal humerus fracture;  Surgeon: Justice Britain, MD;  Location: [facility];  Service: Orthopedics;  Laterality: Right;  138mn   PORT-A-CATH REMOVAL Right 07/18/2019   Procedure: REMOVAL PORT-A-CATH;  Surgeon: CErroll Luna MD;  [place]  [facility]  Service: General;  Laterality: Right;   PORTACATH PLACEMENT N/A 04/28/2018   Procedure: INSERTION PORT-A-CATH WITH ULTRASOUND;  Surgeon: CErroll Luna MD;  [place] [facility]  Service: General;  Laterality: N/A;   PORTACATH PLACEMENT N/A 04/25/2020   Procedure: INSERTION PORT-A-CATH;  Surgeon: CErroll Luna MD;  Location[facility];  Service: General;  Laterality: N/A;  613MIN IN LDOW PLEASE   SENTINEL NODE BIOPSY Left 07/30/2020   Procedure: LEFT SENTINEL NODE BIOPSY;  Surgeon: CErroll Luna MD;  [place] [facility]  Service: General;  Laterality: Left;   SIMPLE MASTECTOMY WITH AXILLARY SENTINEL NODE BIOPSY Bilateral 07/30/2020   Procedure: BILATERAL SIMPLE MASTECTOMY;  Surgeon: CErroll Luna MD;  [place] [facility]  Service: General;  Laterality: Bilateral;   TUBAL LIGATION      Family History  Problem Relation Age of Onset   Kidney cancer Sister 330      d. 371  Lung cancer Paternal Uncle    Melanoma Sister    Melanoma Sister     Social History   Socioeconomic History   Marital status: Married    Spouse name: Not on file   Number of children: Not on file   Years of education: Not on file   Highest education level: Not on file  Occupational History   Not on file  Tobacco Use   Smoking status: Never   Smokeless tobacco:  Never  Vaping Use   Vaping Use: Never used  Substance and Sexual Activity   Alcohol use: Not Currently    Comment: rare   Drug use: Never   Sexual activity: Not Currently    Birth control/protection: Post-menopausal  Other Topics Concern   Not on file  Social History Narrative   Not on file   Social Determinants of Health   Financial Resource Strain: Not on file  Food Insecurity: Not on file  Transportation Needs: Not on file  Physical Activity: Not on file  Stress: Not on file  Social Connections: Not on file  Intimate Partner Violence: Not on file    Outpatient Medications Prior to Visit  Medication Sig Dispense  Refill   cholecalciferol (VITAMIN D3) 25 MCG (1000 UNIT) tablet Take 1,000 Units by mouth in the morning.     gabapentin (NEURONTIN) 100 MG capsule Take 1 capsule (100 mg total) by mouth at bedtime. 90 capsule 4   loratadine (CLARITIN) 10 MG tablet Take 1 tablet (10 mg total) by mouth in the morning. 90 tablet 3   No facility-administered medications prior to visit.    No Known Allergies  Review of Systems    See pertinent positives and negatives per HPI.  Objective:    Physical Exam Vitals reviewed.  HENT:     Right Ear: No tenderness.     Left Ear: No tenderness.     Nose: Congestion and rhinorrhea present.     Mouth/Throat:     Pharynx: Posterior oropharyngeal erythema present.  Cardiovascular:     Rate and Rhythm: Tachycardia present.  Pulmonary:     Effort: Pulmonary effort is normal.     Breath sounds: Normal breath sounds.  Abdominal:     General: Bowel sounds are normal.     Palpations: Abdomen is soft.  Skin:    Capillary Refill: Capillary refill takes less than 2 seconds.  Neurological:     General: No focal deficit present.     Mental Status: She is alert and oriented to person, place, and time.    BP 108/70   Pulse (!) 111   Temp 97.6 F (36.4 C)   Ht _0  (1.626 m)   Wt 146 lb (66.2 kg)   SpO2 95%   BMI 25.06 kg/m   Wt Readings from Last 3 Encounters:  06/12/21 151 lb 8 oz (68.7 kg)  02/20/21 156 lb 9.6 oz (71 kg)  12/05/20 157 lb 3.2 oz (71.3 kg)    Health Maintenance Due  Topic Date Due   Zoster Vaccines- Shingrix (1 of 2) Never done   COLONOSCOPY (Pts 45-3yr Insurance coverage will need to be confirmed)  Never done       Lab Results  Component Value Date   TSH 1.310 02/20/2021   Lab Results  Component Value Date   WBC 5.1 06/12/2021   HGB 13.0 06/12/2021   HCT 40.4 06/12/2021   MCV 93.3 06/12/2021   PLT 189 06/12/2021   Lab Results  Component Value Date   NA 137 06/12/2021   K 4.3 06/12/2021   CO2 27 06/12/2021    GLUCOSE 90 06/12/2021   BUN 16 06/12/2021   CREATININE 0.67 06/12/2021   BILITOT 0.6 06/12/2021   ALKPHOS 72 06/12/2021   AST 16 06/12/2021   ALT 13 06/12/2021   PROT 7.4 06/12/2021   ALBUMIN 4.3 06/12/2021   CALCIUM 8.9 06/12/2021   ANIONGAP 5 06/12/2021   EGFR 101 02/20/2021   Lab Results  Component Value Date   CHOL 219 (H) 02/20/2021   Lab Results  Component Value Date   HDL 76 02/20/2021   Lab Results  Component Value Date   LDLCALC 133 (H) 02/20/2021   Lab Results  Component Value Date   TRIG 60 02/20/2021   Lab Results  Component Value Date   CHOLHDL 2.9 02/20/2021        Assessment & Plan:   1. Acute non-recurrent sinusitis of other sinus - azithromycin (ZITHROMAX) 250 MG tablet; Take 2 tablets on day 1, then 1 tablet daily on days 2 through 5  Dispense: 6 tablet; Refill: 0 - fluticasone (FLONASE) 50 MCG/ACT nasal spray; Place 2 sprays into both nostrils daily.  Dispense: 16 g; Refill: 6     Use Flonase nasal spray daily Take Z-pack as directed Rest and push fluids Follow-up as needed  Follow-up: PRN  An After Visit Summary was printed and given to the patient.  I, Rip Harbour, NP, have reviewed all documentation for this visit. The documentation on 07/16/21 for the exam, diagnosis, procedures, and orders are all accurate and complete.    Signed, Rip Harbour, NP [facility] [phone]

## 2021-07-24 ENCOUNTER — Ambulatory Visit: Payer: BC Managed Care – PPO | Admitting: Physician Assistant

## 2021-07-31 ENCOUNTER — Encounter: Payer: Self-pay | Admitting: Physician Assistant

## 2021-07-31 ENCOUNTER — Ambulatory Visit: Payer: BC Managed Care – PPO | Admitting: Physician Assistant

## 2021-07-31 VITALS — BP 114/66 | HR 82 | Temp 97.7°F | Resp 18 | Ht 64.0 in | Wt 150.8 lb

## 2021-07-31 DIAGNOSIS — R5383 Other fatigue: Secondary | ICD-10-CM

## 2021-07-31 DIAGNOSIS — R002 Palpitations: Secondary | ICD-10-CM

## 2021-07-31 DIAGNOSIS — C50912 Malignant neoplasm of unspecified site of left female breast: Secondary | ICD-10-CM

## 2021-07-31 DIAGNOSIS — E782 Mixed hyperlipidemia: Secondary | ICD-10-CM

## 2021-07-31 NOTE — Progress Notes (Signed)
Subjective:  Patient ID: Kathryn [name], female    DOB: October 12, 1960  Age: 61 y.o. MRN: [id number]  Chief Complaint  Patient presents with   Hyperlipidemia    HPI  Pt here for follow up of hyperlipidemia - has been trying to watch diet and is due to repeat lipid panel - she is currently not on medications  Pt does have history of recurrent breast cancer and following with Dr Lindi Adie - next appt in October 2023.  Pt states that she has felt fatigued recently but  attributes to fact she has been through chemotherapy and radiation.  However she also states she has been having some palpitations. Denies chest pain or pressure.  Denies dyspnea Current Outpatient Medications on File Prior to Visit  Medication Sig Dispense Refill   cholecalciferol (VITAMIN D3) 25 MCG (1000 UNIT) tablet Take 1,000 Units by mouth in the morning.     fluticasone (FLONASE) 50 MCG/ACT nasal spray Place 2 sprays into both nostrils daily. 16 g 6   gabapentin (NEURONTIN) 100 MG capsule Take 1 capsule (100 mg total) by mouth at bedtime. 90 capsule 4   loratadine (CLARITIN) 10 MG tablet Take 1 tablet (10 mg total) by mouth in the morning. 90 tablet 3   [DISCONTINUED] prochlorperazine (COMPAZINE) 10 MG tablet Take 1 tablet (10 mg total) by mouth every 6 (six) hours as needed (Nausea or vomiting). 30 tablet 1   No current facility-administered medications on file prior to visit.   Past Medical History:  Diagnosis Date   Anemia    during chemo   Breast cancer ([place])    stage 3 - left   Family history of kidney cancer    Family history of melanoma    GERD (gastroesophageal reflux disease)    resolved   Headache    hormone related, none since 1st child was born    Personal history of chemotherapy    Personal history of radiation therapy    Pleurisy    Past Surgical History:  Procedure Laterality Date   AXILLARY LYMPH NODE DISSECTION Left 10/11/2018   Procedure: LEFT AXILLARY LYMPH NODE DISSECTION;  Surgeon:  Erroll Luna, MD;  Location: [facility];  Service: General;  Laterality: Left;   BREAST LUMPECTOMY Left 08/2018   BREAST LUMPECTOMY WITH RADIOACTIVE SEED AND SENTINEL LYMPH NODE BIOPSY Left 09/23/2018   Procedure: LEFT BREAST LUMPECTOMY WITH RADIOACTIVE SEED AND LEFT AXILLARY TARGETED LYMPH NODE BIOPY AND  LEFT AXILLARY SENTINEL LYMPH NODE [place];  Surgeon: Erroll Luna, MD;  Location: [facility];  Service: General;  Laterality: Left;   CESAREAN SECTION     x3   FRACTURE SURGERY  2020   humerus - w/ Metta Clines. Supple, MD   ORIF HUMERUS FRACTURE Right 01/31/2019   Procedure: OPEN REDUCTION INTERNAL FIXATION (ORIF) Right 3 part proximal humerus fracture;  Surgeon: Justice Britain, MD;  [facility];  Service: Orthopedics;  Laterality: Right;  170mn   PORT-A-CATH REMOVAL Right 07/18/2019   Procedure: REMOVAL PORT-A-CATH;  Surgeon: CErroll Luna MD;  [place]  Service: General;  Laterality: Right;   PORTACATH PLACEMENT N/A 04/28/2018   Procedure: INSERTION PORT-A-CATH WITH ULTRASOUND;  Surgeon: CErroll Luna MD;  [place] [facility]  Service: General;  Laterality: N/A;   PORTACATH PLACEMENT N/A 04/25/2020   Procedure: INSERTION PORT-A-CATH;  Surgeon: CErroll Luna MD;  [facility];  Service: General;  Laterality: N/A;  60 MIN IN LDOW PLEASE   SENTINEL NODE BIOPSY Left 07/30/2020  Procedure: LEFT SENTINEL NODE BIOPSY;  Surgeon: Erroll Luna, MD;  Location: [facility];  Service: General;  Laterality: Left;   SIMPLE MASTECTOMY WITH AXILLARY SENTINEL NODE BIOPSY Bilateral 07/30/2020   Procedure: BILATERAL SIMPLE MASTECTOMY;  Surgeon: Erroll Luna, MD;  Location: [place];  Service: General;  Laterality: Bilateral;   TUBAL LIGATION      Family History  Problem Relation Age of Onset   Kidney cancer Sister 69       d. 66   Lung cancer Paternal Uncle    Melanoma Sister    Melanoma Sister    Social History   Socioeconomic History   Marital status:  Married    Spouse name: Not on file   Number of children: Not on file   Years of education: Not on file   Highest education level: Not on file  Occupational History   Not on file  Tobacco Use   Smoking status: Never   Smokeless tobacco: Never  Vaping Use   Vaping Use: Never used  Substance and Sexual Activity   Alcohol use: Not Currently    Comment: rare   Drug use: Never   Sexual activity: Not Currently    Birth control/protection: Post-menopausal  Other Topics Concern   Not on file  Social History Narrative   Not on file   Social Determinants of Health   Financial Resource Strain: Not on file  Food Insecurity: Not on file  Transportation Needs: Not on file  Physical Activity: Not on file  Stress: Not on file  Social Connections: Not on file    Review of Systems CONSTITUTIONAL: see HPI CARDIOVASCULAR: see HPI RESPIRATORY: Negative for recent cough and dyspnea.  GASTROINTESTINAL: Negative for abdominal pain, acid reflux symptoms, constipation, diarrhea, nausea and vomiting.  PSYCHIATRIC: Negative for sleep disturbance and to question depression screen.  Negative for depression, negative for anhedonia.       Objective:  PHYSICAL EXAM:   VS: BP 114/66   Pulse 82   Temp 97.7 F (36.5 C)   Resp 18   Ht '5\' 4"'$  (1.626 m)   Wt 150 lb 12.8 oz (68.4 kg)   SpO2 98%   BMI 25.88 kg/m   GEN: Well nourished, well developed, in no acute distress  Cardiac: RRR; no murmurs,  Respiratory:  normal respiratory rate and pattern with no distress - normal breath sounds with no rales, rhonchi, wheezes or rubs Skin: warm and dry, no rash  Psych: euthymic mood, appropriate affect and demeanor  EKG - no acute changes Lab Results  Component Value Date   WBC 5.1 06/12/2021   HGB 13.0 06/12/2021   HCT 40.4 06/12/2021   PLT 189 06/12/2021   GLUCOSE 90 06/12/2021   CHOL 219 (H) 02/20/2021   TRIG 60 02/20/2021   HDL 76 02/20/2021   LDLCALC 133 (H) 02/20/2021   ALT 13  06/12/2021   AST 16 06/12/2021   NA 137 06/12/2021   K 4.3 06/12/2021   CL 105 06/12/2021   CREATININE 0.67 06/12/2021   BUN 16 06/12/2021   CO2 27 06/12/2021   TSH 1.310 02/20/2021      Assessment & Plan:   Problem List Items Addressed This Visit       Other   Recurrent breast cancer, left ([place]) Continue follow up with oncology   Other Visit Diagnoses     Mixed hyperlipidemia    -  Primary   Relevant Orders   Lipid panel Continue to watch diet   Palpitations  Relevant Orders   TSH   EKG 12-Lead   Ambulatory referral to Cardiology   Other fatigue       Relevant Orders   CBC with Differential/Platelet   Comprehensive metabolic panel   TSH   VITAMIN D 25 Hydroxy (Vit-D Deficiency, Fractures)   Ambulatory referral to Cardiology     .  No orders of the defined types were placed in this encounter.   Orders Placed This Encounter  Procedures   CBC with Differential/Platelet   Comprehensive metabolic panel   TSH   Lipid panel   VITAMIN D 25 Hydroxy (Vit-D Deficiency, Fractures)   Ambulatory referral to Cardiology   EKG 12-Lead     Follow-up: Return in about 6 months (around 01/30/2022) for fasting physical.  An After Visit Summary was printed and given to the patient.  Yetta Flock [facility] [phone]

## 2021-08-01 LAB — CBC WITH DIFFERENTIAL/PLATELET
Basophils Absolute: 0 10*3/uL (ref 0.0–0.2)
Basos: 1 %
EOS (ABSOLUTE): 0 10*3/uL (ref 0.0–0.4)
Eos: 1 %
Hematocrit: 37.7 % (ref 34.0–46.6)
Hemoglobin: 12.5 g/dL (ref 11.1–15.9)
Immature Grans (Abs): 0 10*3/uL (ref 0.0–0.1)
Immature Granulocytes: 0 %
Lymphocytes Absolute: 1.8 10*3/uL (ref 0.7–3.1)
Lymphs: 38 %
MCH: 30.5 pg (ref 26.6–33.0)
MCHC: 33.2 g/dL (ref 31.5–35.7)
MCV: 92 fL (ref 79–97)
Monocytes Absolute: 0.4 10*3/uL (ref 0.1–0.9)
Monocytes: 8 %
Neutrophils Absolute: 2.4 10*3/uL (ref 1.4–7.0)
Neutrophils: 52 %
Platelets: 214 10*3/uL (ref 150–450)
RBC: 4.1 x10E6/uL (ref 3.77–5.28)
RDW: 12.8 % (ref 11.7–15.4)
WBC: 4.6 10*3/uL (ref 3.4–10.8)

## 2021-08-01 LAB — COMPREHENSIVE METABOLIC PANEL
ALT: 28 IU/L (ref 0–32)
AST: 25 IU/L (ref 0–40)
Albumin/Globulin Ratio: 1.7 (ref 1.2–2.2)
Albumin: 4.4 g/dL (ref 3.8–4.9)
Alkaline Phosphatase: 97 IU/L (ref 44–121)
BUN/Creatinine Ratio: 24 — ABNORMAL HIGH (ref 9–23)
BUN: 15 mg/dL (ref 6–24)
Bilirubin Total: 0.6 mg/dL (ref 0.0–1.2)
CO2: 23 mmol/L (ref 20–29)
Calcium: 8.9 mg/dL (ref 8.7–10.2)
Chloride: 104 mmol/L (ref 96–106)
Creatinine, Ser: 0.62 mg/dL (ref 0.57–1.00)
Globulin, Total: 2.6 g/dL (ref 1.5–4.5)
Glucose: 79 mg/dL (ref 70–99)
Potassium: 4.3 mmol/L (ref 3.5–5.2)
Sodium: 140 mmol/L (ref 134–144)
Total Protein: 7 g/dL (ref 6.0–8.5)
eGFR: 103 mL/min/{1.73_m2} (ref >59)

## 2021-08-01 LAB — LIPID PANEL
Chol/HDL Ratio: 3.1 ratio (ref 0.0–4.4)
Cholesterol, Total: 202 mg/dL — ABNORMAL HIGH (ref 100–199)
HDL: 66 mg/dL (ref >39)
LDL Chol Calc (NIH): 125 mg/dL — ABNORMAL HIGH (ref 0–99)
Triglycerides: 60 mg/dL (ref 0–149)
VLDL Cholesterol Cal: 11 mg/dL (ref 5–40)

## 2021-08-01 LAB — CARDIOVASCULAR RISK ASSESSMENT

## 2021-08-01 LAB — TSH: TSH: 1.32 u[IU]/mL (ref 0.450–4.500)

## 2021-08-01 LAB — VITAMIN D 25 HYDROXY (VIT D DEFICIENCY, FRACTURES): Vit D, 25-Hydroxy: 19.5 ng/mL — ABNORMAL LOW (ref 30.0–100.0)

## 2021-08-14 ENCOUNTER — Ambulatory Visit: Payer: BC Managed Care – PPO | Admitting: Internal Medicine

## 2021-08-14 ENCOUNTER — Encounter: Payer: Self-pay | Admitting: Internal Medicine

## 2021-08-14 ENCOUNTER — Ambulatory Visit (INDEPENDENT_AMBULATORY_CARE_PROVIDER_SITE_OTHER): Payer: BC Managed Care – PPO

## 2021-08-14 VITALS — BP 126/80 | HR 99 | Ht 64.0 in | Wt 151.2 lb

## 2021-08-14 DIAGNOSIS — R002 Palpitations: Secondary | ICD-10-CM

## 2021-08-14 NOTE — Progress Notes (Unsigned)
Enrolled for [employer] to mail a ZIO XT long term holter monitor to the patients address on file.  

## 2021-08-14 NOTE — Progress Notes (Signed)
Cardiology Office Note:    Date:  08/14/2021   ID:  [name], DOB 1960/09/07, MRN [id number]  PCP:  Marge Duncans, PA-C   [facility] Cardiologist:  Janina Mayo, MD     Referring MD: Marge Duncans, PA-C   No chief complaint on file. Fatigue/Palpitations  History of Present Illness:    Kathryn [name] is a 61 y.o. female with a hx of breast CA survivor who follows with Dr. Lindi Adie, GERD, referral for palpitations.   She sees Dr. Lindi Adie for RUQ left breast cancer ER -, s/p L breast lumpectomy 2020 and RT (she did breath holds), s/p BL simple mastectomy 07/30/2020, underwent doxorubicin therapy  for 3 months and paclitaxel. On cyclophosphamide and 5FU finished 06/2020. She is now done with therapy and a breast cancer survivor. No metastatic dx.  She has no hx of cardiac dx. Baseline echo in 04/25/2018 showed normal LV function and no valve dx.  She notes a stress test in the past that was presumable normal with Dr. Louisa Second.  She notes palpitations once in awhile. She has minimal caffeine. TSH is normal. EKG 07/31/2021 is normal. Qtc 440 ms. She notes palpations have improved. She started taking aspirin and notes palpitations. No syncopal events. No DOE with stairs. She has fatigue with a lot of family responsibility and work. No chest tightness. No orthopnea , or PND no LE edema  The 10-year ASCVD risk score (Arnett DK, et al., 2019) is: 2.5%   Values used to calculate the score:     Age: 7 years     Sex: Female     Is Non-Hispanic African American: No     Diabetic: No     Tobacco smoker: No     Systolic Blood Pressure: 102 mmHg     Is BP treated: No     HDL Cholesterol: 66 mg/dL     Total Cholesterol: 202 mg/dL   Past Medical History:  Diagnosis Date   Anemia    during chemo   Breast cancer ([place])    stage 3 - left   Family history of kidney cancer    Family history of melanoma    GERD (gastroesophageal reflux disease)    resolved   Headache    hormone  related, none since 1st child was born    Personal history of chemotherapy    Personal history of radiation therapy    Pleurisy     Past Surgical History:  Procedure Laterality Date   AXILLARY LYMPH NODE DISSECTION Left 10/11/2018   Procedure: LEFT AXILLARY LYMPH NODE DISSECTION;  Surgeon: Erroll Luna, MD;  Location: [facility];  Service: General;  Laterality: Left;   BREAST LUMPECTOMY Left 08/2018   BREAST LUMPECTOMY WITH RADIOACTIVE SEED AND SENTINEL LYMPH NODE BIOPSY Left 09/23/2018   Procedure: LEFT BREAST LUMPECTOMY WITH RADIOACTIVE SEED AND LEFT AXILLARY TARGETED LYMPH NODE BIOPY AND  LEFT AXILLARY SENTINEL LYMPH NODE [place];  Surgeon: Erroll Luna, MD;  Location: [place];  Service: General;  Laterality: Left;   CESAREAN SECTION     x3   FRACTURE SURGERY  2020   humerus - w/ Metta Clines. Supple, MD   ORIF HUMERUS FRACTURE Right 01/31/2019   Procedure: OPEN REDUCTION INTERNAL FIXATION (ORIF) Right 3 part proximal humerus fracture;  Surgeon: Justice Britain, MD;  Location: [facility];  Service: Orthopedics;  Laterality: Right;  117mn   PORT-A-CATH REMOVAL Right 07/18/2019   Procedure: REMOVAL PORT-A-CATH;  Surgeon: CErroll Luna MD;  Location: [facility];  Service: General;  Laterality: Right;   PORTACATH PLACEMENT N/A 04/28/2018   Procedure: INSERTION PORT-A-CATH WITH ULTRASOUND;  Surgeon: Erroll Luna, MD;  Location: [facility];  Service: General;  Laterality: N/A;   PORTACATH PLACEMENT N/A 04/25/2020   Procedure: INSERTION PORT-A-CATH;  Surgeon: Erroll Luna, MD;  Location: [facility];  Service: General;  Laterality: N/A;  60 MIN IN LDOW PLEASE   SENTINEL NODE BIOPSY Left 07/30/2020   Procedure: LEFT SENTINEL NODE BIOPSY;  Surgeon: Erroll Luna, MD;  Location: [place] [facility];  Service: General;  Laterality: Left;   SIMPLE MASTECTOMY WITH AXILLARY SENTINEL NODE BIOPSY Bilateral 07/30/2020   Procedure: BILATERAL SIMPLE MASTECTOMY;  Surgeon: Erroll Luna, MD;  Location: [place];  Service: General;  Laterality: Bilateral;   TUBAL LIGATION      Current Medications: Current Meds  Medication Sig   cholecalciferol (VITAMIN D3) 25 MCG (1000 UNIT) tablet Take 1,000 Units by mouth in the morning.   fluticasone (FLONASE) 50 MCG/ACT nasal spray Place 2 sprays into both nostrils daily.   gabapentin (NEURONTIN) 100 MG capsule Take 1 capsule (100 mg total) by mouth at bedtime.   loratadine (CLARITIN) 10 MG tablet Take 1 tablet (10 mg total) by mouth in the morning.     Allergies:   Patient has no known allergies.   Social History   Socioeconomic History   Marital status: Married    Spouse name: Not on file   Number of children: Not on file   Years of education: Not on file   Highest education level: Not on file  Occupational History   Not on file  Tobacco Use   Smoking status: Never   Smokeless tobacco: Never  Vaping Use   Vaping Use: Never used  Substance and Sexual Activity   Alcohol use: Not Currently    Comment: rare   Drug use: Never   Sexual activity: Not Currently    Birth control/protection: Post-menopausal  Other Topics Concern   Not on file  Social History Narrative   Not on file   Social Determinants of Health   Financial Resource Strain: Not on file  Food Insecurity: Not on file  Transportation Needs: Not on file  Physical Activity: Not on file  Stress: Not on file  Social Connections: Not on file     Family History: The patient's family history includes Kidney cancer (age of onset: 39) in her sister; Lung cancer in her paternal uncle; Melanoma in her sister and sister. Brother has HLD. GF ? MI.   ROS:   Please see the history of present illness.     All other systems reviewed and are negative.  EKGs/Labs/Other Studies Reviewed:    The following studies were reviewed today:   EKG: none today  Recent Labs: 07/31/2021: ALT 28; BUN 15; Creatinine, Ser 0.62; Hemoglobin 12.5; Platelets 214; Potassium 4.3; Sodium 140; TSH 1.320   Recent Lipid Panel    Component Value Date/Time   CHOL 202 (H) 07/31/2021 1153   TRIG 60 07/31/2021 1153   HDL 66 07/31/2021 1153   CHOLHDL 3.1 07/31/2021 1153   LDLCALC 125 (H) 07/31/2021 1153     Risk Assessment/Calculations:           Physical Exam:    VS:    Vitals:   08/14/21 1434  BP: 126/80  Pulse: 99  SpO2: 97%     Wt Readings from Last 3 Encounters:  08/14/21 151 lb 3.2 oz (68.6 kg)  07/31/21  150 lb 12.8 oz (68.4 kg)  07/16/21 146 lb (66.2 kg)     GEN:  Well nourished, well developed in no acute distress HEENT: Normal NECK: No JVD; No carotid bruits LYMPHATICS: No lymphadenopathy CARDIAC: RRR, no murmurs, rubs, gallops RESPIRATORY:  Clear to auscultation without rales, wheezing or rhonchi  ABDOMEN: Soft, non-tender, non-distended MUSCULOSKELETAL:  No edema; No deformity  SKIN: Warm and dry NEUROLOGIC:  Alert and oriented x 3 PSYCHIATRIC:  Normal affect   ASSESSMENT:    #Palpitations: will get a 7 day zio to assess for arrhythmia.   #HLD:  ASCVD 2.5%. If ASCVD increases to 5% can do CAC score then, to assess need for statin therapy. She is going to work on diet, we discussed foods to avoid  #Cardio Onc:  5-FU has up to 1% risk of afib and frequent risk of sinus bradycardia and PVCs; Paclitaxel can be associated with sinus tachycardia and bradycardia. Rarely is docetaxel associated with arrhythmia ( sinus tach and flutter). She is s/p therapy, risk therefore lower. Kathryn [name] et al. Scientific AHA Statement on arrhythmias and autonomic disorder in cardio-oncology. 2021)  Total AC dose 220 mg/m2.  Typically > 250 mg/m2 is there higher risk of cardiotoxicity.  She has no signs of CHF. She's had 5 FU therapy without hx of vasospasm. She notes breath holds with her RT, which lowers risk of accelerated atherosclerosis and valvular fibrosis.  PLAN:    In order of problems listed above:  7 day zio Follow up in 6 months           Medication  Adjustments/Labs and Tests Ordered: Current medicines are reviewed at length with the patient today.  Concerns regarding medicines are outlined above.  Orders Placed This Encounter  Procedures   LONG TERM MONITOR (3-14 DAYS)   No orders of the defined types were placed in this encounter.   Patient Instructions  Medication Instructions:  The current medical regimen is effective;  continue present plan and medications as directed. Please refer to the Current Medication list given to you today.   *If you need a refill on your cardiac medications before your next appointment, please call your pharmacy*  Lab Work:    NONE     If you have labs (blood work) drawn today and your tests are completely normal, you will receive your results only by: [place] (if you have MyChart) OR  A paper copy in the mail If you have any lab test that is abnormal or we need to change your treatment, we will call you to review the results.  Follow-Up: Your next appointment:  6 month(s) In Person with Krystal Teachey, Royetta Crochet, MD    At [facility], you and your health needs are our priority.  As part of our continuing mission to provide you with exceptional heart care, we have created designated Provider Care Teams.  These Care Teams include your primary Cardiologist (physician) and Advanced Practice Providers (APPs -  Physician Assistants and Nurse Practitioners) who all work together to provide you with the care you need, when you need it.  Important Information About Sugar       ZIO XT- Long Term Monitor Instructions  Your physician has requested you wear a ZIO patch monitor for 7 days.  This is a single patch monitor. [employer] supplies one patch monitor per enrollment. Additional stickers are not available. Please do not apply patch if you will be having a Nuclear Stress Test,  Echocardiogram, Cardiac CT, MRI, or  Chest Xray during the period you would be wearing the  monitor. The patch cannot be worn  during these tests. You cannot remove and re-apply the  ZIO XT patch monitor.  Your ZIO patch monitor will be mailed 3 day USPS to your address on file. It may take 3-5 days  to receive your monitor after you have been enrolled.  Once you have received your monitor, please review the enclosed instructions. Your monitor  has already been registered assigning a specific monitor serial # to you.  Billing and Patient Assistance Program Information  We have supplied [employer] with any of your insurance information on file for billing purposes. [employer] offers a sliding scale Patient Assistance Program for patients that do not have  insurance, or whose insurance does not completely cover the cost of the ZIO monitor.  You must apply for the Patient Assistance Program to qualify for this discounted rate.  To apply, please call [employer] at [phone], select option 4, select option 2, ask to apply for  Patient Assistance Program. Theodore Demark will ask your household income, and how many people  are in your household. They will quote your out-of-pocket cost based on that information.  [employer] will also be able to set up a 51-month interest-free payment plan if needed.  Applying the monitor   Shave hair from upper left chest.  Hold abrader disc by orange tab. Rub abrader in 40 strokes over the upper left chest as  indicated in your monitor instructions.  Clean area with 4 enclosed alcohol pads. Let dry.  Apply patch as indicated in monitor instructions. Patch will be placed under collarbone on left  side of chest with arrow pointing upward.  Rub patch adhesive wings for 2 minutes. Remove white label marked "1". Remove the white  label marked "2". Rub patch adhesive wings for 2 additional minutes.  While looking in a mirror, press and release button in center of patch. A small green light will  flash 3-4 times. This will be your only indicator that the monitor has been turned on.  Do not shower for the  first 24 hours. You may shower after the first 24 hours.  Press the button if you feel a symptom. You will hear a small click. Record Date, Time and  Symptom in the Patient Logbook.  When you are ready to remove the patch, follow instructions on the last 2 pages of Patient  Logbook. Stick patch monitor onto the last page of Patient Logbook.  Place Patient Logbook in the blue and white box. Use locking tab on box and tape box closed  securely. The blue and white box has prepaid postage on it. Please place it in the mailbox as  soon as possible. Your physician should have your test results approximately 7 days after the  monitor has been [facility]  Call [place]at [phone] you have questions regarding  your ZIO XT patch monitor. Call them immediately if you see an orange light blinking on your  monitor.  If your monitor falls off in less than 4 days, contact our Monitor department at [phone]  If your monitor becomes loose or falls off after 4 days call [employer] at [phone]  suggestions on securing your monitor       Signed, BJanina Mayo MD  08/14/2021 3:51 PM    [place]

## 2021-08-17 DIAGNOSIS — R002 Palpitations: Secondary | ICD-10-CM

## 2021-09-01 DIAGNOSIS — R002 Palpitations: Secondary | ICD-10-CM | POA: Diagnosis not present

## 2021-09-16 DIAGNOSIS — M7989 Other specified soft tissue disorders: Secondary | ICD-10-CM | POA: Diagnosis not present

## 2021-11-26 ENCOUNTER — Telehealth: Payer: Self-pay | Admitting: Hematology and Oncology

## 2021-11-26 NOTE — Telephone Encounter (Signed)
Scheduled appointment per provider on call. Left voicemail.  

## 2021-12-11 ENCOUNTER — Inpatient Hospital Stay: Payer: BC Managed Care – PPO

## 2021-12-11 ENCOUNTER — Inpatient Hospital Stay: Payer: BC Managed Care – PPO | Admitting: Hematology and Oncology

## 2021-12-11 NOTE — Progress Notes (Signed)
Patient Care Team: Marge Duncans, PA-C as PCP - General (Physician Assistant) Janina Mayo, MD as PCP - Cardiology (Cardiology) Rockwell Germany, RN as Oncology Nurse Navigator Mauro Kaufmann, RN as Oncology Nurse Navigator Erroll Luna, MD as Consulting Physician (General Surgery) Magrinat, Virgie Dad, MD (Inactive) as Consulting Physician (Oncology) Kyung Rudd, MD as Consulting Physician (Radiation Oncology) Cindra Presume, MD (Inactive) as Consulting Physician (Plastic Surgery)  DIAGNOSIS:  Encounter Diagnosis  Name Primary?   Malignant neoplasm of upper-outer quadrant of left breast in female, estrogen receptor negative ([place])     SUMMARY OF ONCOLOGIC HISTORY: Oncology History  Malignant neoplasm of upper-outer quadrant of left breast in female, estrogen receptor negative ([place])  04/11/2018 Initial Diagnosis   Malignant neoplasm of upper-outer quadrant of left breast in female, estrogen receptor negative ([place])   05/03/2018 - 08/09/2018 Chemotherapy   The patient had dexamethasone (DECADRON) 4 MG tablet, 1 of 1 cycle, Start date: 06/29/2018, End date: 07/26/2018 DOXOrubicin (ADRIAMYCIN) chemo injection 108 mg, 60 mg/m2 = 108 mg, Intravenous,  Once, 4 of 4 cycles Dose modification: 50 mg/m2 (original dose 60 mg/m2, Cycle 3, Reason: Provider Judgment), 50 mg/m2 (original dose 60 mg/m2, Cycle 4, Reason: Provider Judgment) Administration: 108 mg (05/03/2018), 108 mg (05/17/2018), 90 mg (06/07/2018), 90 mg (06/28/2018) palonosetron (ALOXI) injection 0.25 mg, 0.25 mg, Intravenous,  Once, 1 of 1 cycle Administration: 0.25 mg (05/03/2018) pegfilgrastim (NEULASTA ONPRO KIT) injection 6 mg, 6 mg, Subcutaneous, Once, 1 of 1 cycle Administration: 6 mg (06/28/2018) pegfilgrastim-cbqv (UDENYCA) injection 6 mg, 6 mg, Subcutaneous, Once, 3 of 3 cycles Administration: 6 mg (05/05/2018), 6 mg (05/19/2018), 6 mg (06/09/2018) CARBOplatin (PARAPLATIN) 230 mg in sodium chloride 0.9 % 250 mL chemo infusion, 230 mg  (11500 % of original dose 2 mg), Intravenous,  Once, 2 of 4 cycles Dose modification: 2 mg (original dose 2 mg, Cycle 5),   (original dose 2 mg, Cycle 5, Reason: Provider Judgment),   (original dose 2 mg, Cycle 5),   (original dose 230 mg, Cycle 5) Administration: 230 mg (07/20/2018), 230 mg (07/26/2018), 230 mg (08/02/2018), 230 mg (08/09/2018) cyclophosphamide (CYTOXAN) 1,080 mg in sodium chloride 0.9 % 250 mL chemo infusion, 600 mg/m2 = 1,080 mg, Intravenous,  Once, 4 of 4 cycles Dose modification: 500 mg/m2 (original dose 600 mg/m2, Cycle 3, Reason: Provider Judgment), 500 mg/m2 (original dose 600 mg/m2, Cycle 4, Reason: Provider Judgment) Administration: 1,080 mg (05/03/2018), 1,080 mg (05/17/2018), 900 mg (06/07/2018), 900 mg (06/28/2018) PACLitaxel (TAXOL) 144 mg in sodium chloride 0.9 % 250 mL chemo infusion (</= 37m/m2), 80 mg/m2 = 144 mg, Intravenous,  Once, 2 of 4 cycles Administration: 144 mg (07/20/2018), 144 mg (07/26/2018), 144 mg (08/09/2018), 144 mg (08/02/2018) fosaprepitant (EMEND) 150 mg, dexamethasone (DECADRON) 12 mg in sodium chloride 0.9 % 145 mL IVPB, , Intravenous,  Once, 6 of 8 cycles Administration:  (05/03/2018),  (07/20/2018),  (05/17/2018),  (06/07/2018),  (06/28/2018),  (08/09/2018),  (07/26/2018),  (08/02/2018)  for chemotherapy treatment.    04/09/2020 Cancer Staging   Staging form: Breast, AJCC 8th Edition - Clinical stage from 04/09/2020: Stage IB (rcT1c, cN0, cM0, G3, ER-, PR-, HER2-) - Signed by CGardenia Phlegm NP on 04/17/2020 Stage prefix: Recurrence   04/30/2020 - 06/13/2020 Chemotherapy         07/02/2020 - 07/02/2020 Chemotherapy   Patient is on Treatment Plan : BREAST Adjuvant CMF IV q21d       CHIEF COMPLIANT:  Surveillance and follow-up on breast cancer  INTERVAL HISTORY: Kathryn [name]  Kathryn [name] is a 61 y.o with above mentioned history of Recurrent triple negative breast cancer (s/p bilateral mastectomies). She presents to the clinic for surveillance and follow up. She states  that she had some pain under rib and some under left arm. She has been working and carrying her grand son on hip.     ALLERGIES:  has No Known Allergies.  MEDICATIONS:  Current Outpatient Medications  Medication Sig Dispense Refill   cholecalciferol (VITAMIN D3) 25 MCG (1000 UNIT) tablet Take 1,000 Units by mouth in the morning.     fluticasone (FLONASE) 50 MCG/ACT nasal spray Place 2 sprays into both nostrils daily. 16 g 6   gabapentin (NEURONTIN) 100 MG capsule Take 1 capsule (100 mg total) by mouth at bedtime. 90 capsule 4   loratadine (CLARITIN) 10 MG tablet Take 1 tablet (10 mg total) by mouth in the morning. 90 tablet 3   No current facility-administered medications for this visit.    PHYSICAL EXAMINATION: ECOG PERFORMANCE STATUS: 1 - Symptomatic but completely ambulatory  Vitals:   12/15/21 1527  BP: 136/83  Pulse: 97  Resp: 16  Temp: 97.7 F (36.5 C)  SpO2: 100%   Filed Weights   12/15/21 1527  Weight: 152 lb 11.2 oz (69.3 kg)     LABORATORY DATA:  I have reviewed the data as listed    Latest Ref Rng & Units 12/15/2021    2:23 PM 07/31/2021   11:53 AM 06/12/2021   10:29 AM  CMP  Glucose 70 - 99 mg/dL 85  79  90   BUN 6 - 20 mg/dL _0 Creatinine 0.44 - 1.00 mg/dL 0.61  0.62  0.67   Sodium 135 - 145 mmol/L 137  140  137   Potassium 3.5 - 5.1 mmol/L 4.0  4.3  4.3   Chloride 98 - 111 mmol/L 104  104  105   CO2 22 - 32 mmol/L _1 Calcium 8.9 - 10.3 mg/dL 9.4  8.9  8.9   Total Protein 6.5 - 8.1 g/dL 7.2  7.0  7.4   Total Bilirubin 0.3 - 1.2 mg/dL 0.5  0.6  0.6   Alkaline Phos 38 - 126 U/L 87  97  72   AST 15 - 41 U/L _2 ALT 0 - 44 U/L 36  28  13     Lab Results  Component Value Date   WBC 5.8 12/15/2021   HGB 12.7 12/15/2021   HCT 38.4 12/15/2021   MCV 94.6 12/15/2021   PLT 241 12/15/2021   NEUTROABS 3.4 12/15/2021    ASSESSMENT & PLAN:  Malignant neoplasm of upper-outer quadrant of left breast in female, estrogen receptor  negative ([place]) 04/06/2018: T2N2 stage IIIc grade 3 IDC triple negative Ki-67 70% (5 cm satellite nodules, 3 abnormal lymph nodes) 05/03/2018-08/09/2018: Neoadjuvant Adriamycin and Cytoxan followed by Taxol and carboplatin x4 (stopped because of neuropathy) 09/23/2018: Left lumpectomy: Residual grade 2 IDC triple negative, 1/1 lymph node positive, axillary dissection 10/11/2018 showed 7 negative lymph nodes 12/14/2018-01/31/2019: Adjuvant radiation with capecitabine, followed by capecitabine maintenance completed 08/04/2019 Genetics: Negative for mutations 04/09/2020: Breast cancer recurrence: Multifocal grade 3 IDC 1.6 cm and 0.4 cm Ki-67 60% (bone scan and CT CAP: No distant mets) 04/30/2020-07/02/2020: TC x4 cycles 07/30/2020: Bilateral mastectomies: Right: Benign, left: Residual T1 microscopic IDC grade 2, margins negative, no reconstruction   Current treatment: Surveillance  Chest wall examination  12/15/2021: Benign Abdominal pain: CT chest abdomen pelvis 06/19/2021: Benign  Return to clinic in 1 year for follow-up    Orders Placed This Encounter  Procedures   CBC with Differential ([place] Only)    Standing Status:   Future    Standing Expiration Date:   12/16/2022   CMP ([place] only)    Standing Status:   Future    Standing Expiration Date:   12/16/2022   The patient has a good understanding of the overall plan. she agrees with it. she will call with any problems that may develop before the next visit here. Total time spent: 30 mins including face to face time and time spent for planning, charting and co-ordination of care   Harriette Ohara, MD 12/15/21    I Gardiner Coins am scribing for Dr. Lindi Adie  I have reviewed the above documentation for accuracy and completeness, and I agree with the above.

## 2021-12-15 ENCOUNTER — Inpatient Hospital Stay: Payer: BC Managed Care – PPO | Attending: Hematology and Oncology

## 2021-12-15 ENCOUNTER — Inpatient Hospital Stay: Payer: BC Managed Care – PPO | Admitting: Hematology and Oncology

## 2021-12-15 DIAGNOSIS — Z923 Personal history of irradiation: Secondary | ICD-10-CM | POA: Insufficient documentation

## 2021-12-15 DIAGNOSIS — C50412 Malignant neoplasm of upper-outer quadrant of left female breast: Secondary | ICD-10-CM | POA: Diagnosis not present

## 2021-12-15 DIAGNOSIS — Z853 Personal history of malignant neoplasm of breast: Secondary | ICD-10-CM | POA: Diagnosis not present

## 2021-12-15 DIAGNOSIS — Z171 Estrogen receptor negative status [ER-]: Secondary | ICD-10-CM

## 2021-12-15 DIAGNOSIS — Z9013 Acquired absence of bilateral breasts and nipples: Secondary | ICD-10-CM | POA: Diagnosis not present

## 2021-12-15 LAB — CMP (CANCER CENTER ONLY)
ALT: 36 U/L (ref 0–44)
AST: 26 U/L (ref 15–41)
Albumin: 4.2 g/dL (ref 3.5–5.0)
Alkaline Phosphatase: 87 U/L (ref 38–126)
Anion gap: 4 — ABNORMAL LOW (ref 5–15)
BUN: 15 mg/dL (ref 6–20)
CO2: 29 mmol/L (ref 22–32)
Calcium: 9.4 mg/dL (ref 8.9–10.3)
Chloride: 104 mmol/L (ref 98–111)
Creatinine: 0.61 mg/dL (ref 0.44–1.00)
GFR, Estimated: >60 mL/min (ref >60)
Glucose, Bld: 85 mg/dL (ref 70–99)
Potassium: 4 mmol/L (ref 3.5–5.1)
Sodium: 137 mmol/L (ref 135–145)
Total Bilirubin: 0.5 mg/dL (ref 0.3–1.2)
Total Protein: 7.2 g/dL (ref 6.5–8.1)

## 2021-12-15 LAB — CBC WITH DIFFERENTIAL (CANCER CENTER ONLY)
Abs Immature Granulocytes: 0.02 10*3/uL (ref 0.00–0.07)
Basophils Absolute: 0 10*3/uL (ref 0.0–0.1)
Basophils Relative: 1 %
Eosinophils Absolute: 0.1 10*3/uL (ref 0.0–0.5)
Eosinophils Relative: 1 %
HCT: 38.4 % (ref 36.0–46.0)
Hemoglobin: 12.7 g/dL (ref 12.0–15.0)
Immature Granulocytes: 0 %
Lymphocytes Relative: 31 %
Lymphs Abs: 1.8 10*3/uL (ref 0.7–4.0)
MCH: 31.3 pg (ref 26.0–34.0)
MCHC: 33.1 g/dL (ref 30.0–36.0)
MCV: 94.6 fL (ref 80.0–100.0)
Monocytes Absolute: 0.5 10*3/uL (ref 0.1–1.0)
Monocytes Relative: 8 %
Neutro Abs: 3.4 10*3/uL (ref 1.7–7.7)
Neutrophils Relative %: 59 %
Platelet Count: 241 10*3/uL (ref 150–400)
RBC: 4.06 MIL/uL (ref 3.87–5.11)
RDW: 13.5 % (ref 11.5–15.5)
WBC Count: 5.8 10*3/uL (ref 4.0–10.5)
nRBC: 0 % (ref 0.0–0.2)

## 2021-12-15 NOTE — Assessment & Plan Note (Signed)
04/06/2018: T2N2 stage IIIc grade 3 IDC triple negative Ki-67 70% (5 cm satellite nodules, 3 abnormal lymph nodes) 05/03/2018-08/09/2018: Neoadjuvant Adriamycin and Cytoxan followed by Taxol and carboplatin x4 (stopped because of neuropathy) 09/23/2018: Left lumpectomy: Residual grade 2 IDC triple negative, 1/1 lymph node positive, axillary dissection 10/11/2018 showed 7 negative lymph nodes 12/14/2018-01/31/2019: Adjuvant radiation with capecitabine, followed by capecitabine maintenance completed 08/04/2019 Genetics: Negative for mutations 04/09/2020: Breast cancer recurrence: Multifocal grade 3 IDC 1.6 cm and 0.4 cm Ki-67 60% (bone scan and CT CAP: No distant mets) 04/30/2020-07/02/2020: TC x4 cycles 07/30/2020: Bilateral mastectomies: Right: Benign, left: Residual T1 microscopic IDC grade 2, margins negative, no reconstruction  Current treatment: Surveillance  Chest wall examination 12/15/2021: Benign Abdominal pain: CT chest abdomen pelvis 06/19/2021: Benign  Return to clinic in 1 year for follow-up

## 2021-12-16 ENCOUNTER — Telehealth: Payer: Self-pay | Admitting: Hematology and Oncology

## 2021-12-16 NOTE — Telephone Encounter (Signed)
Scheduled appointment per 10/23 los. Left voicemail.

## 2022-01-02 ENCOUNTER — Telehealth: Payer: BC Managed Care – PPO | Admitting: Physician Assistant

## 2022-01-02 DIAGNOSIS — R051 Acute cough: Secondary | ICD-10-CM | POA: Diagnosis not present

## 2022-01-02 DIAGNOSIS — Z20828 Contact with and (suspected) exposure to other viral communicable diseases: Secondary | ICD-10-CM | POA: Diagnosis not present

## 2022-01-02 MED ORDER — BENZONATATE 100 MG PO CAPS: 100.0000 mg | ORAL_CAPSULE | Freq: Three times a day (TID) | ORAL | 0 refills | Status: DC | PRN

## 2022-01-02 MED ORDER — PREDNISONE 20 MG PO TABS: 40.0000 mg | ORAL_TABLET | Freq: Every day | ORAL | 0 refills | Status: DC

## 2022-01-02 NOTE — Progress Notes (Signed)
We are sorry that you are not feeling well.  Here is how we plan to help!  Based on your presentation I believe you most likely have A cough due to a virus.  This is called viral bronchitis and is best treated by rest, plenty of fluids and control of the cough.  You may use Ibuprofen or Tylenol as directed to help your symptoms.     In addition you may use A non-prescription cough medication called Mucinex DM: take 2 tablets every 12 hours. and A prescription cough medication called Tessalon Perles '100mg'$ . You may take 1-2 capsules every 8 hours as needed for your cough.  Prednisone '20mg'$  Take 2 tablets ('40mg'$ ) daily for 5 days  From your responses in the eVisit questionnaire you describe inflammation in the upper respiratory tract which is causing a significant cough.  This is commonly called Bronchitis and has four common causes:   Allergies Viral Infections Acid Reflux Bacterial Infection Allergies, viruses and acid reflux are treated by controlling symptoms or eliminating the cause. An example might be a cough caused by taking certain blood pressure medications. You stop the cough by changing the medication. Another example might be a cough caused by acid reflux. Controlling the reflux helps control the cough.  USE OF BRONCHODILATOR ("RESCUE") INHALERS: There is a risk from using your bronchodilator too frequently.  The risk is that over-reliance on a medication which only relaxes the muscles surrounding the breathing tubes can reduce the effectiveness of medications prescribed to reduce swelling and congestion of the tubes themselves.  Although you feel brief relief from the bronchodilator inhaler, your asthma may actually be worsening with the tubes becoming more swollen and filled with mucus.  This can delay other crucial treatments, such as oral steroid medications. If you need to use a bronchodilator inhaler daily, several times per day, you should discuss this with your provider.  There are  probably better treatments that could be used to keep your asthma under control.     HOME CARE Only take medications as instructed by your medical team. Complete the entire course of an antibiotic. Drink plenty of fluids and get plenty of rest. Avoid close contacts especially the very young and the elderly Cover your mouth if you cough or cough into your sleeve. Always remember to wash your hands A steam or ultrasonic humidifier can help congestion.   GET HELP RIGHT AWAY IF: You develop worsening fever. You become short of breath You cough up blood. Your symptoms persist after you have completed your treatment plan MAKE SURE YOU  Understand these instructions. Will watch your condition. Will get help right away if you are not doing well or get worse.    Thank you for choosing an e-visit.  Your e-visit answers were reviewed by a board certified advanced clinical practitioner to complete your personal care plan. Depending upon the condition, your plan could have included both over the counter or prescription medications.  Please review your pharmacy choice. Make sure the pharmacy is open so you can pick up prescription now. If there is a problem, you may contact your provider through [employer] and have the prescription routed to another pharmacy.  Your safety is important to [place]. If you have drug allergies check your prescription carefully.   For the next 24 hours you can use MyChart to ask questions about today's visit, request a non-urgent call back, or ask for a work or school excuse. You will get an email in the next  two days asking about your experience. I hope that your e-visit has been valuable and will speed your recovery.  I have spent 5 minutes in review of e-visit questionnaire, review and updating patient chart, medical decision making and response to patient.   Mar Daring, PA-C

## 2022-01-07 ENCOUNTER — Encounter: Payer: Self-pay | Admitting: Legal Medicine

## 2022-01-07 ENCOUNTER — Ambulatory Visit: Payer: BC Managed Care – PPO | Admitting: Legal Medicine

## 2022-01-07 VITALS — BP 110/70 | HR 80 | Temp 97.7°F | Resp 14 | Ht 64.0 in | Wt 155.0 lb

## 2022-01-07 DIAGNOSIS — J180 Bronchopneumonia, unspecified organism: Secondary | ICD-10-CM | POA: Diagnosis not present

## 2022-01-07 DIAGNOSIS — Z171 Estrogen receptor negative status [ER-]: Secondary | ICD-10-CM

## 2022-01-07 DIAGNOSIS — C50412 Malignant neoplasm of upper-outer quadrant of left female breast: Secondary | ICD-10-CM | POA: Diagnosis not present

## 2022-01-07 MED ORDER — DOXYCYCLINE HYCLATE 100 MG PO TABS: 100.0000 mg | ORAL_TABLET | Freq: Two times a day (BID) | ORAL | 0 refills | Status: DC

## 2022-01-07 NOTE — Progress Notes (Signed)
Acute Office Visit  Subjective:    Patient ID: Kathryn [name], female    DOB: 04-Jul-1960, 61 y.o.   MRN: [id number]  Chief Complaint  Patient presents with   Cough   Sinusitis    HPI: Patient is in today for productive cough, chest congestion, sinus pressure and some headache since 10 days ago. She did e-chart 5 days ago and they diagnosed her with Bronchitis and they prescribed prednisone 40 mg at day x 5 days and Tessalon pearls for coughing ,Mucinex OTC. She feels better, but she still has the chest congestion.  She denies fever, chills, ear pain, SOB.  Past Medical History:  Diagnosis Date   Anemia    during chemo   Breast cancer ([place])    stage 3 - left   Family history of kidney cancer    Family history of melanoma    GERD (gastroesophageal reflux disease)    resolved   Headache    hormone related, none since 1st child was born    Personal history of chemotherapy    Personal history of radiation therapy    Pleurisy     Past Surgical History:  Procedure Laterality Date   AXILLARY LYMPH NODE DISSECTION Left 10/11/2018   Procedure: LEFT AXILLARY LYMPH NODE DISSECTION;  Surgeon: Erroll Luna, MD;  Location: [facility];  Service: General;  Laterality: Left;   BREAST LUMPECTOMY Left 08/2018   BREAST LUMPECTOMY WITH RADIOACTIVE SEED AND SENTINEL LYMPH NODE BIOPSY Left 09/23/2018   Procedure: LEFT BREAST LUMPECTOMY WITH RADIOACTIVE SEED AND LEFT AXILLARY TARGETED LYMPH NODE BIOPY AND  LEFT AXILLARY SENTINEL LYMPH NODE [place];  Surgeon: Erroll Luna, MD;  Location: [facility];  Service: General;  Laterality: Left;   CESAREAN SECTION     x3   FRACTURE SURGERY  2020   humerus - w/ Metta Clines. Supple, MD   ORIF HUMERUS FRACTURE Right 01/31/2019   Procedure: OPEN REDUCTION INTERNAL FIXATION (ORIF) Right 3 part proximal humerus fracture;  Surgeon: Justice Britain, MD;  Location: [facility];  Service: Orthopedics;  Laterality: Right;  19mn   PORT-A-CATH REMOVAL Right  07/18/2019   Procedure: REMOVAL PORT-A-CATH;  Surgeon: CErroll Luna MD;  [place] [facility]  Service: General;  Laterality: Right;   PORTACATH PLACEMENT N/A 04/28/2018   Procedure: INSERTION PORT-A-CATH WITH ULTRASOUND;  Surgeon: CErroll Luna MD;  [facility] [facility]  Service: General;  Laterality: N/A;   PORTACATH PLACEMENT N/A 04/25/2020   Procedure: INSERTION PORT-A-CATH;  Surgeon: CErroll Luna MD;  Location[facility];  Service: General;  Laterality: N/A;  60 MIN IN LDOW PLEASE   SENTINEL NODE BIOPSY Left 07/30/2020   Procedure: LEFT SENTINEL NODE BIOPSY;  Surgeon: CErroll Luna MD;  [place] [facility]  Service: General;  Laterality: Left;   SIMPLE MASTECTOMY WITH AXILLARY SENTINEL NODE BIOPSY Bilateral 07/30/2020   Procedure: BILATERAL SIMPLE MASTECTOMY;  Surgeon: CErroll Luna MD;  [facility]  Service: General;  Laterality: Bilateral;   TUBAL LIGATION      Family History  Problem Relation Age of Onset   Kidney cancer Sister 350      d. 341  Lung cancer Paternal Uncle    Melanoma Sister    Melanoma Sister     Social History   Socioeconomic History   Marital status: Married    Spouse name: Not on file   Number of children: Not on file   Years of education: Not on file   Highest education level:  Not on file  Occupational History   Not on file  Tobacco Use   Smoking status: Never   Smokeless tobacco: Never  Vaping Use   Vaping Use: Never used  Substance and Sexual Activity   Alcohol use: Not Currently    Comment: rare   Drug use: Never   Sexual activity: Not Currently    Birth control/protection: Post-menopausal  Other Topics Concern   Not on file  Social History Narrative   Not on file   Social Determinants of Health   Financial Resource Strain: Not on file  Food Insecurity: Not on file  Transportation Needs: Not on file  Physical Activity: Not on file  Stress: Not on file  Social Connections: Not on file  Intimate Partner Violence:  Not on file    Outpatient Medications Prior to Visit  Medication Sig Dispense Refill   benzonatate (TESSALON) 100 MG capsule Take 1 capsule (100 mg total) by mouth 3 (three) times daily as needed. 30 capsule 0   cholecalciferol (VITAMIN D3) 25 MCG (1000 UNIT) tablet Take 1,000 Units by mouth in the morning.     fluticasone (FLONASE) 50 MCG/ACT nasal spray Place 2 sprays into both nostrils daily. 16 g 6   loratadine (CLARITIN) 10 MG tablet Take 1 tablet (10 mg total) by mouth in the morning. 90 tablet 3   gabapentin (NEURONTIN) 100 MG capsule Take 1 capsule (100 mg total) by mouth at bedtime. 90 capsule 4   predniSONE (DELTASONE) 20 MG tablet Take 2 tablets (40 mg total) by mouth daily with breakfast. 10 tablet 0   No facility-administered medications prior to visit.    No Known Allergies  Review of Systems  Constitutional:  Negative for chills, fatigue and fever.  HENT:  Positive for congestion, sinus pressure and sinus pain. Negative for ear pain and sore throat.   Eyes:  Negative for visual disturbance.  Respiratory:  Positive for cough and chest tightness. Negative for shortness of breath.   Cardiovascular:  Negative for chest pain and palpitations.  Gastrointestinal:  Negative for abdominal pain, constipation, diarrhea, nausea and vomiting.  Endocrine: Negative for polydipsia, polyphagia and polyuria.  Genitourinary:  Negative for difficulty urinating and dysuria.  Musculoskeletal:  Negative for arthralgias, back pain and myalgias.  Skin:  Negative for rash.  Neurological:  Positive for headaches.  Psychiatric/Behavioral:  Negative for dysphoric mood. The patient is not nervous/anxious.        Objective:    Physical Exam Vitals reviewed.  Constitutional:      General: She is not in acute distress.    Appearance: Normal appearance.  HENT:     Head: Normocephalic.     Right Ear: Tympanic membrane normal.     Left Ear: Tympanic membrane normal.     Nose: Congestion  present.     Mouth/Throat:     Mouth: Mucous membranes are moist.     Pharynx: Oropharynx is clear.  Eyes:     Extraocular Movements: Extraocular movements intact.     Conjunctiva/sclera: Conjunctivae normal.     Pupils: Pupils are equal, round, and reactive to light.  Cardiovascular:     Rate and Rhythm: Normal rate and regular rhythm.     Pulses: Normal pulses.     Heart sounds: Normal heart sounds. No murmur heard.    No gallop.  Pulmonary:     Effort: Pulmonary effort is normal. No respiratory distress.     Breath sounds: Rhonchi present. No wheezing.  Abdominal:  General: Abdomen is flat. Bowel sounds are normal. There is no distension.     Palpations: Abdomen is soft.     Tenderness: There is no abdominal tenderness.  Musculoskeletal:        General: Normal range of motion.     Cervical back: Normal range of motion.  Skin:    General: Skin is warm.     Capillary Refill: Capillary refill takes less than 2 seconds.  Neurological:     General: No focal deficit present.     Mental Status: She is alert and oriented to person, place, and time. Mental status is at baseline.     Gait: Gait normal.     BP 110/70   Pulse 80   Temp 97.7 F (36.5 C)   Resp 14   Ht _0  (1.626 m)   Wt 155 lb (70.3 kg)   SpO2 98%   BMI 26.61 kg/m  Wt Readings from Last 3 Encounters:  01/07/22 155 lb (70.3 kg)  12/15/21 152 lb 11.2 oz (69.3 kg)  08/14/21 151 lb 3.2 oz (68.6 kg)    Health Maintenance Due  Topic Date Due   Zoster Vaccines- Shingrix (1 of 2) Never done   INFLUENZA VACCINE  09/23/2021    There are no preventive care reminders to display for this patient.   Lab Results  Component Value Date   TSH 1.320 07/31/2021   Lab Results  Component Value Date   WBC 5.8 12/15/2021   HGB 12.7 12/15/2021   HCT 38.4 12/15/2021   MCV 94.6 12/15/2021   PLT 241 12/15/2021   Lab Results  Component Value Date   NA 137 12/15/2021   K 4.0 12/15/2021   CO2 29 12/15/2021    GLUCOSE 85 12/15/2021   BUN 15 12/15/2021   CREATININE 0.61 12/15/2021   BILITOT 0.5 12/15/2021   ALKPHOS 87 12/15/2021   AST 26 12/15/2021   ALT 36 12/15/2021   PROT 7.2 12/15/2021   ALBUMIN 4.2 12/15/2021   CALCIUM 9.4 12/15/2021   ANIONGAP 4 (L) 12/15/2021   EGFR 103 07/31/2021   Lab Results  Component Value Date   CHOL 202 (H) 07/31/2021   Lab Results  Component Value Date   HDL 66 07/31/2021   Lab Results  Component Value Date   LDLCALC 125 (H) 07/31/2021   Lab Results  Component Value Date   TRIG 60 07/31/2021   Lab Results  Component Value Date   CHOLHDL 3.1 07/31/2021   No results found for: "HGBA1C"     Assessment & Plan:   Problem List Items Addressed This Visit       Respiratory   Bronchopneumonia   Relevant Medications   doxycycline (VIBRA-TABS) 100 MG tablet Bronchopneumonia treat with vibramycin     Other   Malignant neoplasm of upper-outer quadrant of left breast in female, estrogen receptor negative (HCC) - Primary   Relevant Medications   doxycycline (VIBRA-TABS) 100 MG tablet This is stable   Meds ordered this encounter  Medications   doxycycline (VIBRA-TABS) 100 MG tablet    Sig: Take 1 tablet (100 mg total) by mouth 2 (two) times daily.    Dispense:  20 tablet    Refill:  0       Follow-up: Return if symptoms worsen or fail to improve.  An After Visit Summary was printed and given to the patient.  Reinaldo Meeker, MD [facility] [phone]

## 2022-01-26 DIAGNOSIS — Z171 Estrogen receptor negative status [ER-]: Secondary | ICD-10-CM | POA: Diagnosis not present

## 2022-01-26 DIAGNOSIS — Z9013 Acquired absence of bilateral breasts and nipples: Secondary | ICD-10-CM | POA: Diagnosis not present

## 2022-01-26 DIAGNOSIS — C50412 Malignant neoplasm of upper-outer quadrant of left female breast: Secondary | ICD-10-CM | POA: Diagnosis not present

## 2022-02-02 ENCOUNTER — Encounter: Payer: BC Managed Care – PPO | Admitting: Physician Assistant

## 2022-02-10 ENCOUNTER — Ambulatory Visit: Payer: BC Managed Care – PPO | Admitting: Internal Medicine

## 2022-06-12 NOTE — Progress Notes (Signed)
Patient Care Team: Marianne Sofia, PA-C as PCP - General (Physician Assistant) Maisie Fus, MD as PCP - Cardiology (Cardiology) Donnelly Angelica, RN as Oncology Nurse Navigator Pershing Proud, RN as Oncology Nurse Navigator Harriette Bouillon, MD as Consulting Physician (General Surgery) Magrinat, Valentino Hue, MD (Inactive) as Consulting Physician (Oncology) Dorothy Puffer, MD as Consulting Physician (Radiation Oncology) Allena Napoleon, MD as Consulting Physician (Plastic Surgery)  DIAGNOSIS:  Encounter Diagnosis  Name Primary?   Malignant neoplasm of upper-outer quadrant of left breast in female, estrogen receptor negative Yes    SUMMARY OF ONCOLOGIC HISTORY: Oncology History  Malignant neoplasm of upper-outer quadrant of left breast in female, estrogen receptor negative  04/11/2018 Initial Diagnosis   Malignant neoplasm of upper-outer quadrant of left breast in female, estrogen receptor negative (HCC)   05/03/2018 - 08/09/2018 Chemotherapy   The patient had dexamethasone (DECADRON) 4 MG tablet, 1 of 1 cycle, Start date: 06/29/2018, End date: 07/26/2018 DOXOrubicin (ADRIAMYCIN) chemo injection 108 mg, 60 mg/m2 = 108 mg, Intravenous,  Once, 4 of 4 cycles Dose modification: 50 mg/m2 (original dose 60 mg/m2, Cycle 3, Reason: Provider Judgment), 50 mg/m2 (original dose 60 mg/m2, Cycle 4, Reason: Provider Judgment) Administration: 108 mg (05/03/2018), 108 mg (05/17/2018), 90 mg (06/07/2018), 90 mg (06/28/2018) palonosetron (ALOXI) injection 0.25 mg, 0.25 mg, Intravenous,  Once, 1 of 1 cycle Administration: 0.25 mg (05/03/2018) pegfilgrastim (NEULASTA ONPRO KIT) injection 6 mg, 6 mg, Subcutaneous, Once, 1 of 1 cycle Administration: 6 mg (06/28/2018) pegfilgrastim-cbqv (UDENYCA) injection 6 mg, 6 mg, Subcutaneous, Once, 3 of 3 cycles Administration: 6 mg (05/05/2018), 6 mg (05/19/2018), 6 mg (06/09/2018) CARBOplatin (PARAPLATIN) 230 mg in sodium chloride 0.9 % 250 mL chemo infusion, 230 mg (11500 % of original  dose 2 mg), Intravenous,  Once, 2 of 4 cycles Dose modification: 2 mg (original dose 2 mg, Cycle 5),   (original dose 2 mg, Cycle 5, Reason: Provider Judgment),   (original dose 2 mg, Cycle 5),   (original dose 230 mg, Cycle 5) Administration: 230 mg (07/20/2018), 230 mg (07/26/2018), 230 mg (08/02/2018), 230 mg (08/09/2018) cyclophosphamide (CYTOXAN) 1,080 mg in sodium chloride 0.9 % 250 mL chemo infusion, 600 mg/m2 = 1,080 mg, Intravenous,  Once, 4 of 4 cycles Dose modification: 500 mg/m2 (original dose 600 mg/m2, Cycle 3, Reason: Provider Judgment), 500 mg/m2 (original dose 600 mg/m2, Cycle 4, Reason: Provider Judgment) Administration: 1,080 mg (05/03/2018), 1,080 mg (05/17/2018), 900 mg (06/07/2018), 900 mg (06/28/2018) PACLitaxel (TAXOL) 144 mg in sodium chloride 0.9 % 250 mL chemo infusion (</= 80mg /m2), 80 mg/m2 = 144 mg, Intravenous,  Once, 2 of 4 cycles Administration: 144 mg (07/20/2018), 144 mg (07/26/2018), 144 mg (08/09/2018), 144 mg (08/02/2018) fosaprepitant (EMEND) 150 mg, dexamethasone (DECADRON) 12 mg in sodium chloride 0.9 % 145 mL IVPB, , Intravenous,  Once, 6 of 8 cycles Administration:  (05/03/2018),  (07/20/2018),  (05/17/2018),  (06/07/2018),  (06/28/2018),  (08/09/2018),  (07/26/2018),  (08/02/2018)  for chemotherapy treatment.    04/09/2020 Cancer Staging   Staging form: Breast, AJCC 8th Edition - Clinical stage from 04/09/2020: Stage IB (rcT1c, cN0, cM0, G3, ER-, PR-, HER2-) - Signed by Loa Socks, NP on 04/17/2020 Stage prefix: Recurrence   04/30/2020 - 06/13/2020 Chemotherapy         07/02/2020 - 07/02/2020 Chemotherapy   Patient is on Treatment Plan : BREAST Adjuvant CMF IV q21d       CHIEF COMPLIANT: Surveillance and follow-up on breast cancer   INTERVAL HISTORY: Kathryn [name] is  a 62 y.o with above mentioned history of Recurrent triple negative breast cancer (s/p bilateral mastectomies). She presents to the clinic for surveillance and follow up.  She denies any lumps or  nodules in the chest or axilla.  She has had intermittent pains in the belly that have traveled from 1 side to the other and they are not consistent or constant.   ALLERGIES:  has No Known Allergies.  MEDICATIONS:  Current Outpatient Medications  Medication Sig Dispense Refill   benzonatate (TESSALON) 100 MG capsule Take 1 capsule (100 mg total) by mouth 3 (three) times daily as needed. 30 capsule 0   cholecalciferol (VITAMIN D3) 25 MCG (1000 UNIT) tablet Take 1,000 Units by mouth in the morning.     doxycycline (VIBRA-TABS) 100 MG tablet Take 1 tablet (100 mg total) by mouth 2 (two) times daily. 20 tablet 0   fluticasone (FLONASE) 50 MCG/ACT nasal spray Place 2 sprays into both nostrils daily. 16 g 6   loratadine (CLARITIN) 10 MG tablet Take 1 tablet (10 mg total) by mouth in the morning. 90 tablet 3   No current facility-administered medications for this visit.    PHYSICAL EXAMINATION: ECOG PERFORMANCE STATUS: 1 - Symptomatic but completely ambulatory  Vitals:   06/17/22 1424  BP: 124/71  Pulse: (!) 104  Resp: 18  Temp: (!) 97.3 F (36.3 C)  SpO2: 99%   Filed Weights   06/17/22 1424  Weight: 154 lb 8 oz (70.1 kg)    BREAST: No palpable lumps or nodules in bilateral chest walls or axilla (exam performed in the presence of a chaperone)  LABORATORY DATA:  I have reviewed the data as listed    Latest Ref Rng & Units 06/17/2022    2:04 PM 12/15/2021    2:23 PM 07/31/2021   11:53 AM  CMP  Glucose 70 - 99 mg/dL 80  85  79   BUN 6 - 20 mg/dL 16  15  15    Creatinine 0.44 - 1.00 mg/dL 8.65  7.84  6.96   Sodium 135 - 145 mmol/L 140  137  140   Potassium 3.5 - 5.1 mmol/L 4.0  4.0  4.3   Chloride 98 - 111 mmol/L 104  104  104   CO2 22 - 32 mmol/L 30  29  23    Calcium 8.9 - 10.3 mg/dL 9.7  9.4  8.9   Total Protein 6.5 - 8.1 g/dL 7.4  7.2  7.0   Total Bilirubin 0.3 - 1.2 mg/dL 0.5  0.5  0.6   Alkaline Phos 38 - 126 U/L 73  87  97   AST 15 - 41 U/L 19  26  25    ALT 0 - 44 U/L  18  36  28     Lab Results  Component Value Date   WBC 6.6 06/17/2022   HGB 13.3 06/17/2022   HCT 40.2 06/17/2022   MCV 93.7 06/17/2022   PLT 241 06/17/2022   NEUTROABS 4.2 06/17/2022    ASSESSMENT & PLAN:  Malignant neoplasm of upper-outer quadrant of left breast in female, estrogen receptor negative (HCC) 04/06/2018: T2N2 stage IIIc grade 3 IDC triple negative Ki-67 70% (5 cm satellite nodules, 3 abnormal lymph nodes) 05/03/2018-08/09/2018: Neoadjuvant Adriamycin and Cytoxan followed by Taxol and carboplatin x4 (stopped because of neuropathy) 09/23/2018: Left lumpectomy: Residual grade 2 IDC triple negative, 1/1 lymph node positive, axillary dissection 10/11/2018 showed 7 negative lymph nodes 12/14/2018-01/31/2019: Adjuvant radiation with capecitabine, followed by capecitabine maintenance completed 08/04/2019  Genetics: Negative for mutations 04/09/2020: Breast cancer recurrence: Multifocal grade 3 IDC 1.6 cm and 0.4 cm Ki-67 60% (bone scan and CT CAP: No distant mets) 04/30/2020-07/02/2020: TC x4 cycles 07/30/2020: Bilateral mastectomies: Right: Benign, left: Residual T1 microscopic IDC grade 2, margins negative, no reconstruction   Current treatment: Surveillance  Chest wall examination 12/17/2022: Benign   Return to clinic in 1 year for follow-up    No orders of the defined types were placed in this encounter.  The patient has a good understanding of the overall plan. she agrees with it. she will call with any problems that may develop before the next visit here. Total time spent: 30 mins including face to face time and time spent for planning, charting and co-ordination of care   Tamsen Meek, MD 06/17/22    I Janan Ridge am acting as a Neurosurgeon for [employer]  I have reviewed the above documentation for accuracy and completeness, and I agree with the above.

## 2022-06-17 ENCOUNTER — Other Ambulatory Visit: Payer: Self-pay

## 2022-06-17 ENCOUNTER — Inpatient Hospital Stay: Payer: BC Managed Care – PPO | Attending: Hematology and Oncology

## 2022-06-17 ENCOUNTER — Inpatient Hospital Stay: Payer: BC Managed Care – PPO | Admitting: Hematology and Oncology

## 2022-06-17 ENCOUNTER — Encounter: Payer: Self-pay | Admitting: Oncology

## 2022-06-17 VITALS — BP 124/71 | HR 104 | Temp 97.3°F | Resp 18 | Ht 64.0 in | Wt 154.5 lb

## 2022-06-17 DIAGNOSIS — Z9013 Acquired absence of bilateral breasts and nipples: Secondary | ICD-10-CM | POA: Insufficient documentation

## 2022-06-17 DIAGNOSIS — Z923 Personal history of irradiation: Secondary | ICD-10-CM | POA: Diagnosis not present

## 2022-06-17 DIAGNOSIS — Z171 Estrogen receptor negative status [ER-]: Secondary | ICD-10-CM | POA: Insufficient documentation

## 2022-06-17 DIAGNOSIS — C50412 Malignant neoplasm of upper-outer quadrant of left female breast: Secondary | ICD-10-CM | POA: Diagnosis not present

## 2022-06-17 LAB — CBC WITH DIFFERENTIAL (CANCER CENTER ONLY)
Abs Immature Granulocytes: 0.01 10*3/uL (ref 0.00–0.07)
Basophils Absolute: 0 10*3/uL (ref 0.0–0.1)
Basophils Relative: 1 %
Eosinophils Absolute: 0.1 10*3/uL (ref 0.0–0.5)
Eosinophils Relative: 1 %
HCT: 40.2 % (ref 36.0–46.0)
Hemoglobin: 13.3 g/dL (ref 12.0–15.0)
Immature Granulocytes: 0 %
Lymphocytes Relative: 28 %
Lymphs Abs: 1.8 10*3/uL (ref 0.7–4.0)
MCH: 31 pg (ref 26.0–34.0)
MCHC: 33.1 g/dL (ref 30.0–36.0)
MCV: 93.7 fL (ref 80.0–100.0)
Monocytes Absolute: 0.5 10*3/uL (ref 0.1–1.0)
Monocytes Relative: 7 %
Neutro Abs: 4.2 10*3/uL (ref 1.7–7.7)
Neutrophils Relative %: 63 %
Platelet Count: 241 10*3/uL (ref 150–400)
RBC: 4.29 MIL/uL (ref 3.87–5.11)
RDW: 13.4 % (ref 11.5–15.5)
WBC Count: 6.6 10*3/uL (ref 4.0–10.5)
nRBC: 0 % (ref 0.0–0.2)

## 2022-06-17 LAB — CMP (CANCER CENTER ONLY)
ALT: 18 U/L (ref 0–44)
AST: 19 U/L (ref 15–41)
Albumin: 4.4 g/dL (ref 3.5–5.0)
Alkaline Phosphatase: 73 U/L (ref 38–126)
Anion gap: 6 (ref 5–15)
BUN: 16 mg/dL (ref 6–20)
CO2: 30 mmol/L (ref 22–32)
Calcium: 9.7 mg/dL (ref 8.9–10.3)
Chloride: 104 mmol/L (ref 98–111)
Creatinine: 0.77 mg/dL (ref 0.44–1.00)
GFR, Estimated: >60 mL/min (ref >60)
Glucose, Bld: 80 mg/dL (ref 70–99)
Potassium: 4 mmol/L (ref 3.5–5.1)
Sodium: 140 mmol/L (ref 135–145)
Total Bilirubin: 0.5 mg/dL (ref 0.3–1.2)
Total Protein: 7.4 g/dL (ref 6.5–8.1)

## 2022-06-17 NOTE — Assessment & Plan Note (Signed)
04/06/2018: T2N2 stage IIIc grade 3 IDC triple negative Ki-67 70% (5 cm satellite nodules, 3 abnormal lymph nodes) 05/03/2018-08/09/2018: Neoadjuvant Adriamycin and Cytoxan followed by Taxol and carboplatin x4 (stopped because of neuropathy) 09/23/2018: Left lumpectomy: Residual grade 2 IDC triple negative, 1/1 lymph node positive, axillary dissection 10/11/2018 showed 7 negative lymph nodes 12/14/2018-01/31/2019: Adjuvant radiation with capecitabine, followed by capecitabine maintenance completed 08/04/2019 Genetics: Negative for mutations 04/09/2020: Breast cancer recurrence: Multifocal grade 3 IDC 1.6 cm and 0.4 cm Ki-67 60% (bone scan and CT CAP: No distant mets) 04/30/2020-07/02/2020: TC x4 cycles 07/30/2020: Bilateral mastectomies: Right: Benign, left: Residual T1 microscopic IDC grade 2, margins negative, no reconstruction   Current treatment: Surveillance  Chest wall examination 12/17/2022: Benign   Return to clinic in 1 year for follow-up

## 2022-06-25 ENCOUNTER — Other Ambulatory Visit: Payer: Self-pay | Admitting: Hematology and Oncology

## 2022-06-25 ENCOUNTER — Other Ambulatory Visit: Payer: Self-pay | Admitting: *Deleted

## 2022-06-25 MED ORDER — LORATADINE 10 MG PO TABS: ORAL_TABLET | ORAL | 3 refills | Status: DC

## 2022-07-08 IMAGING — US US BREAST BX W LOC DEV 1ST LESION IMG BX SPEC US GUIDE*L*
1 series · 12 of 15 positions shown · non-contrast
Comparison: Previous exam(s).
COMPARISON: Previous exam(s).

Addendum:
CLINICAL DATA: 60-year-old female with 2 suspicious left breast
masses with inner lumpectomy bed.

EXAM:
ULTRASOUND GUIDED LEFT BREAST CORE NEEDLE BIOPSY

[Series 1: us breast bx w loc dev 1st lesion img bx spec us g · 0.07mm/px · 12 of 15 slices shown]
[im 1/15]
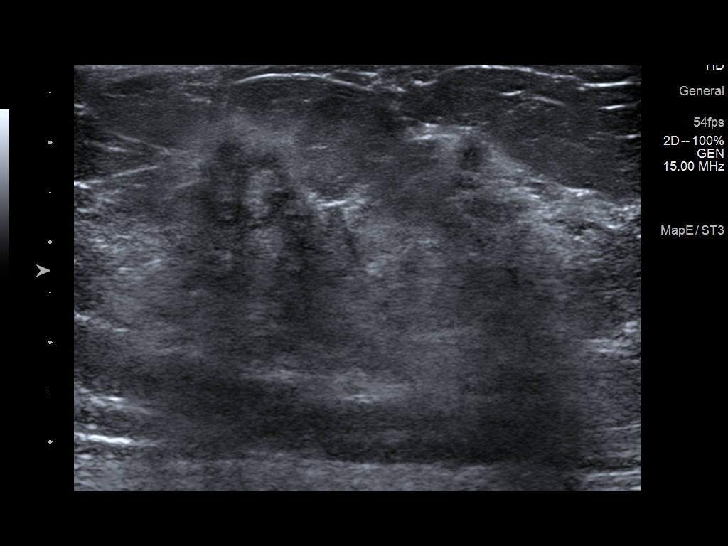
[im 2/15]
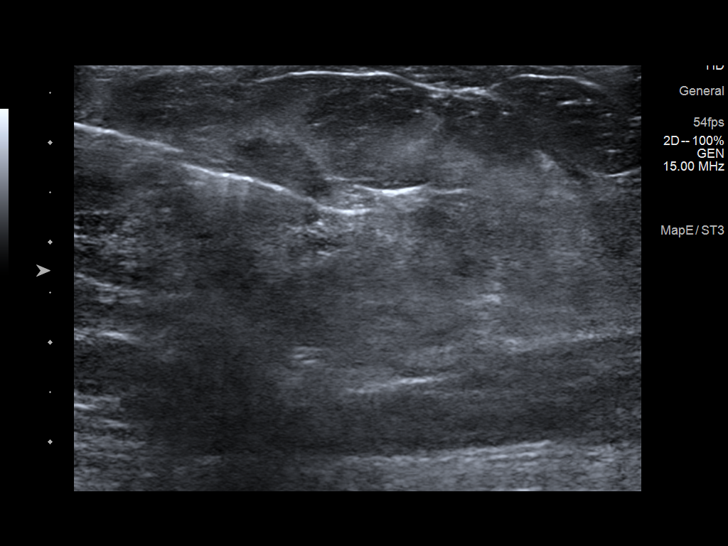
[im 4/15]
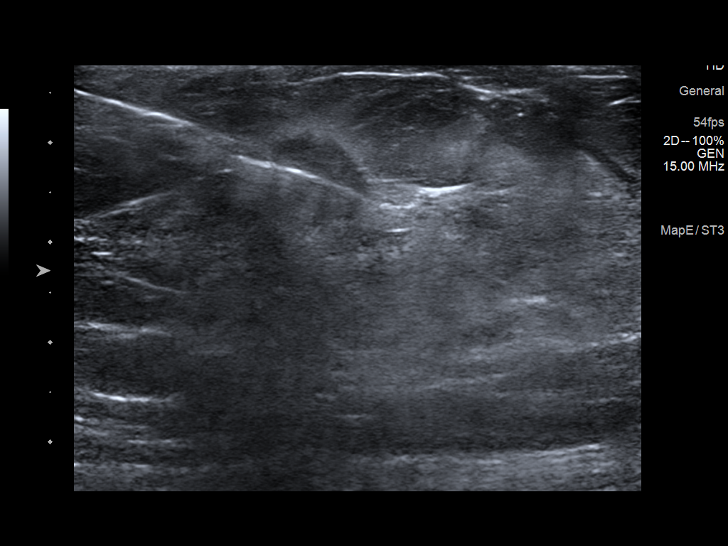
[im 5/15]
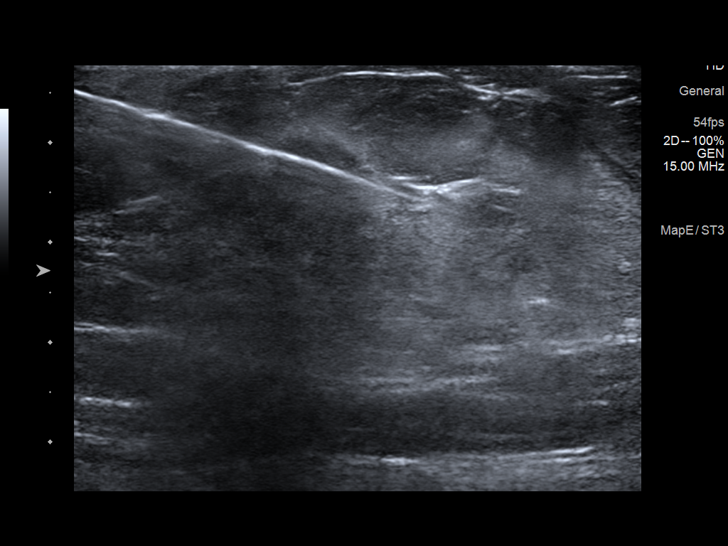
[im 6/15]
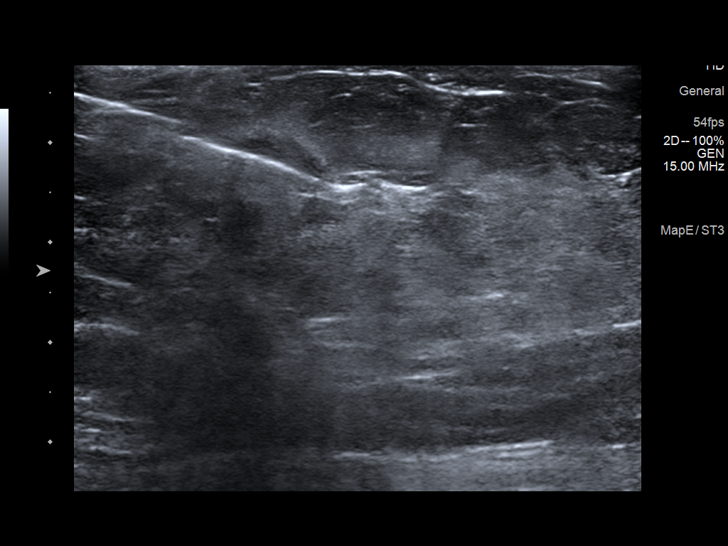
[im 7/15]
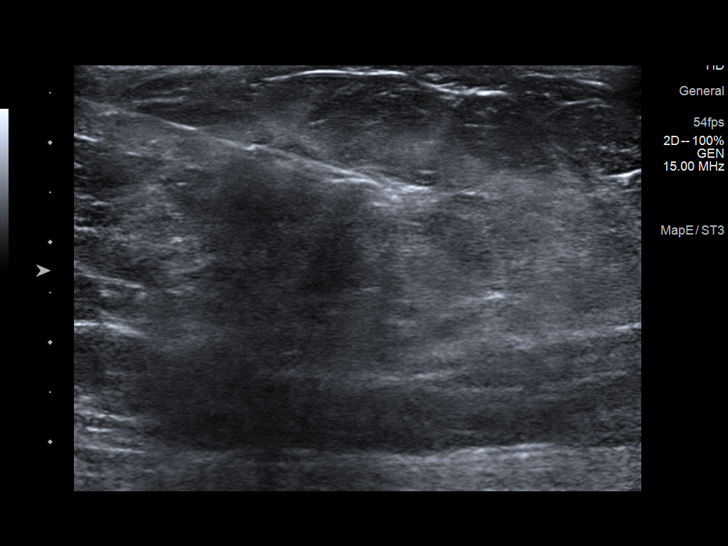
[im 9/15]
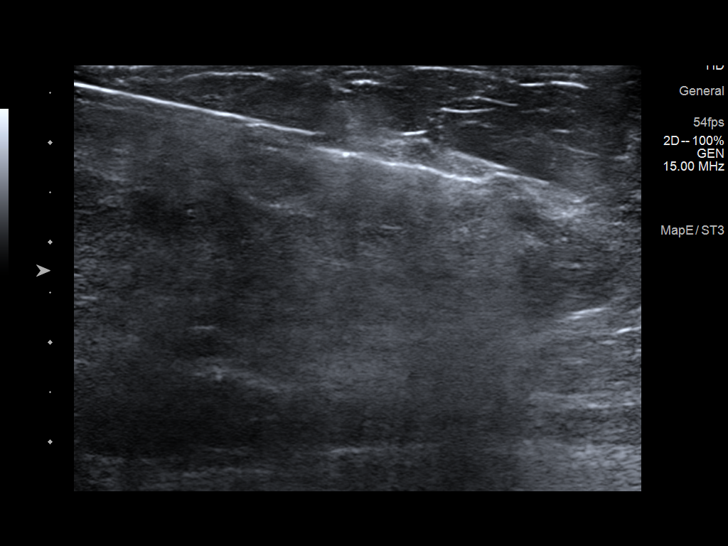
[im 10/15]
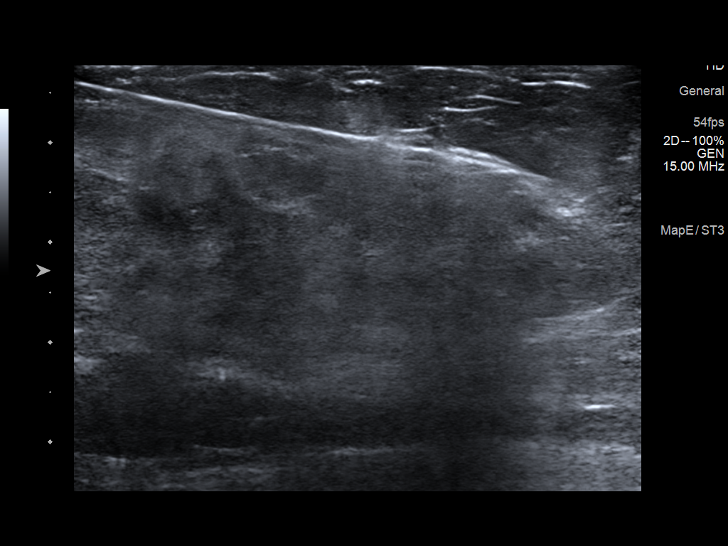
[im 11/15]
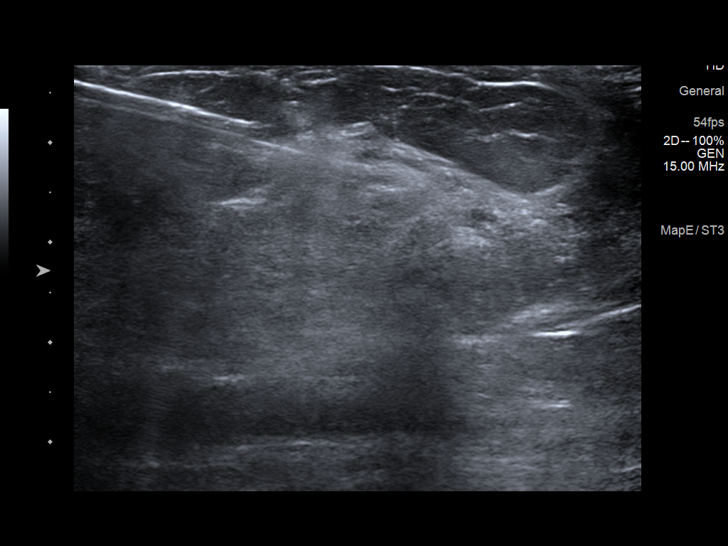
[im 12/15]
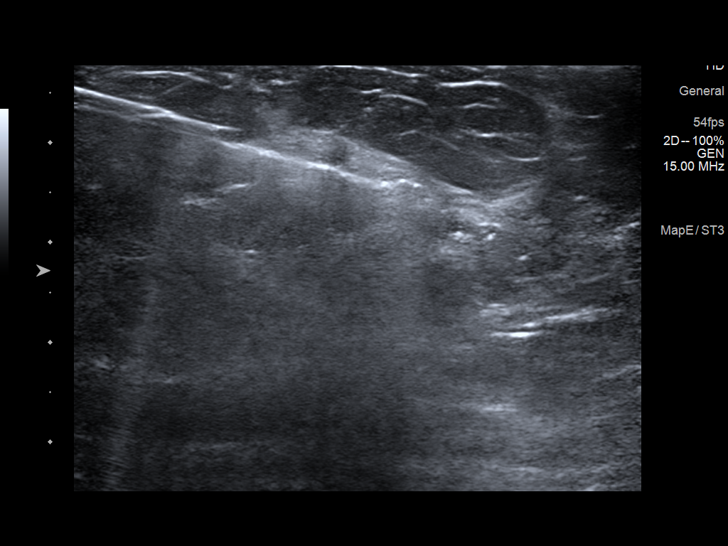
[im 14/15]
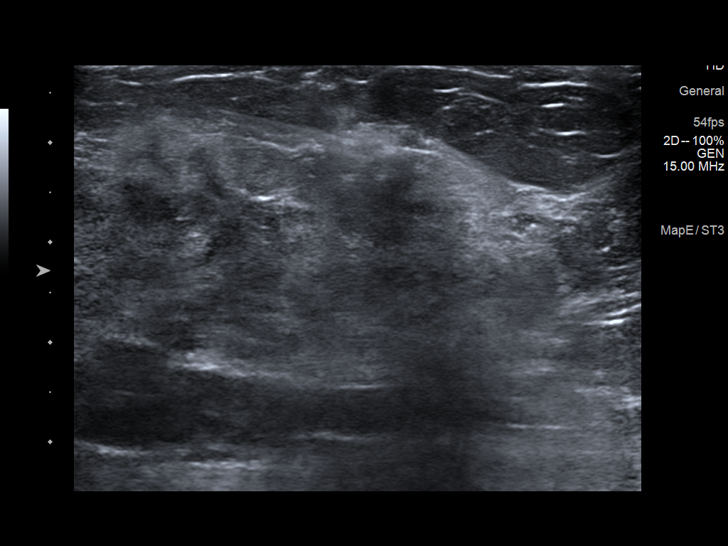
[im 15/15]
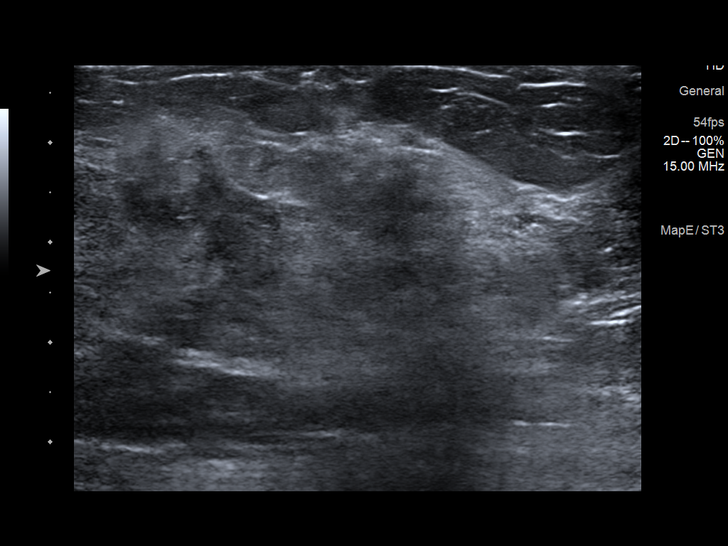

[12 of 15 positions shown; findings below may reference images not displayed]



Lesion quadrant: Upper outer quadrant

Using sterile technique and 1% Lidocaine as local anesthetic, under
direct ultrasound visualization, a 14 gauge Feld device was
used to perform biopsy of a large mass at the 3 o'clock position
using a inferior approach. At the conclusion of the procedure a
ribbon shaped tissue marker clip was deployed into the biopsy
cavity.

Lesion quadrant: Upper outer quadrant

Using sterile technique and 1% Lidocaine as local anesthetic, under
direct ultrasound visualization, a 14 gauge Feld device was
used to perform biopsy of the smaller mass at the 3 o'clock position
using a inferior approach. At the conclusion of the procedure a coil
shaped tissue marker clip was deployed into the biopsy cavity.

Follow up 2 view mammogram was performed and dictated separately.
IMPRESSION: Ultrasound guided biopsy of the left breast x2. No apparent
complications.

ADDENDUM:
Pathology revealed GRADE III INVASIVE DUCTAL CARCINOMA of the Left
breast, 3 o'clock, 9cmfn (ribbon clip). This was found to be
concordant by Dr. Alie Urbano.

Pathology revealed GRADE III INVASIVE DUCTAL CARCINOMA of the Left
breast, 3 o'clock, 9cmfn (coil clip). This was found to be
concordant by Dr. Alie Urbano.

Pathology results were discussed with the patient by telephone. The
patient reported doing well after the biopsies with tenderness at
the sites. Post biopsy instructions and care were reviewed and
questions were answered. The patient was encouraged to call The
direct phone number was provided.

Surgical consultation has been arranged with Dr. Madeth Naddeo at
[REDACTED] on April 19, 2020.

A medical oncology referral is being arrange with Dr. Muthu Gargiulo
at [HOSPITAL] [HOSPITAL].

Pathology results reported by Lorraine Jim, RN on 04/10/2020.



Lesion quadrant: Upper outer quadrant

Using sterile technique and 1% Lidocaine as local anesthetic, under
direct ultrasound visualization, a 14 gauge Feld device was
used to perform biopsy of a large mass at the 3 o'clock position
using a inferior approach. At the conclusion of the procedure a
ribbon shaped tissue marker clip was deployed into the biopsy
cavity.

Lesion quadrant: Upper outer quadrant

Using sterile technique and 1% Lidocaine as local anesthetic, under
direct ultrasound visualization, a 14 gauge Feld device was
used to perform biopsy of the smaller mass at the 3 o'clock position
using a inferior approach. At the conclusion of the procedure a coil
shaped tissue marker clip was deployed into the biopsy cavity.

Follow up 2 view mammogram was performed and dictated separately.
IMPRESSION: Ultrasound guided biopsy of the left breast x2. No apparent
complications.

## 2022-07-08 IMAGING — MG MM BREAST LOCALIZATION CLIP
4 series · 4 of 12 positions shown · non-contrast
Comparison: Previous exam(s).

CLINICAL DATA: 60-year-old female status post 2 area
ultrasound-guided biopsy of the left breast.

EXAM:
DIAGNOSTIC LEFT MAMMOGRAM POST ULTRASOUND BIOPSY

[L ML synth-2D]
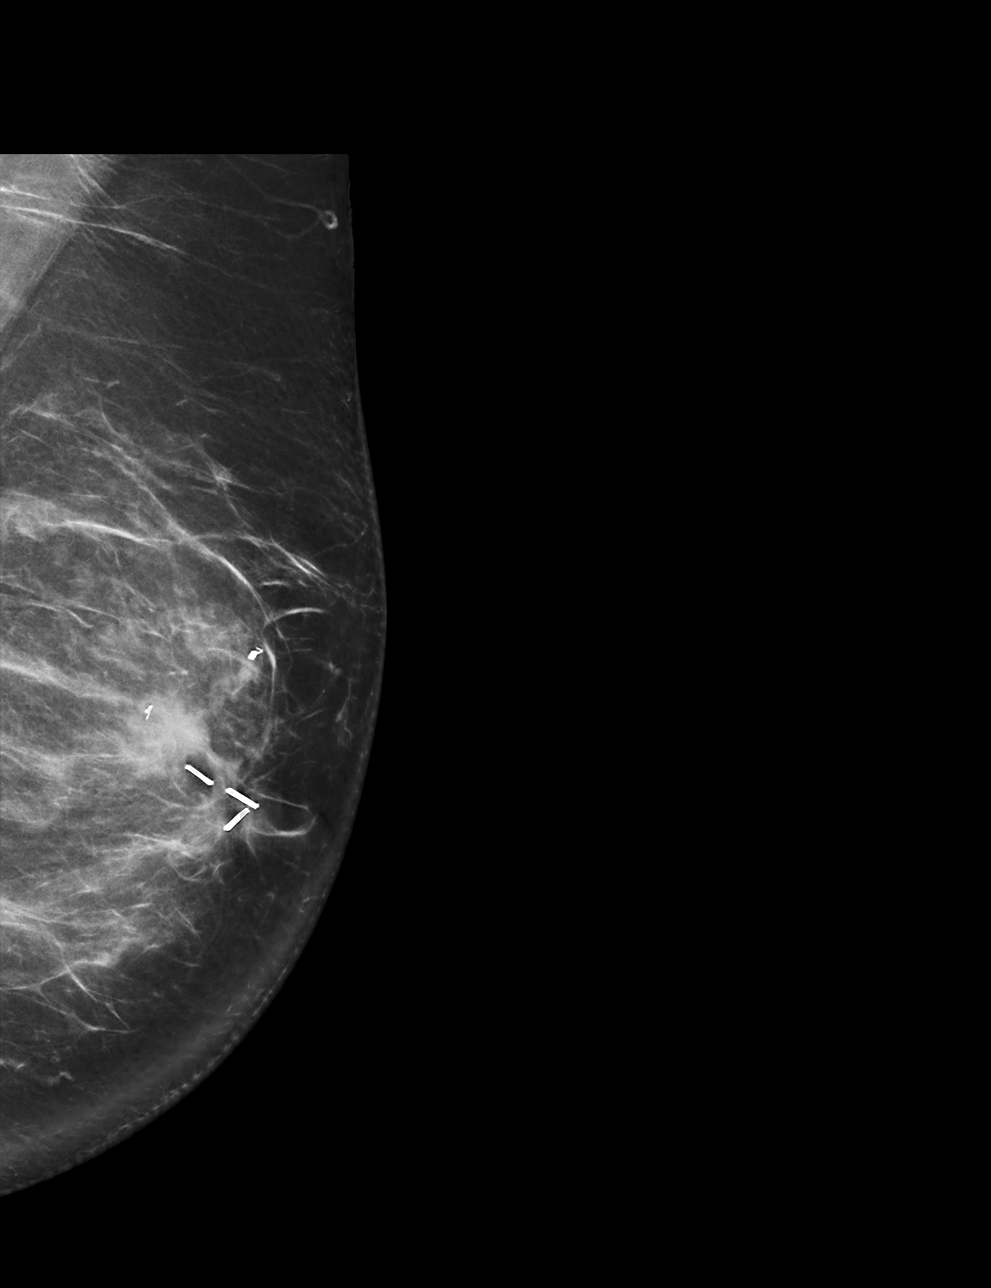

[L CC synth-2D]
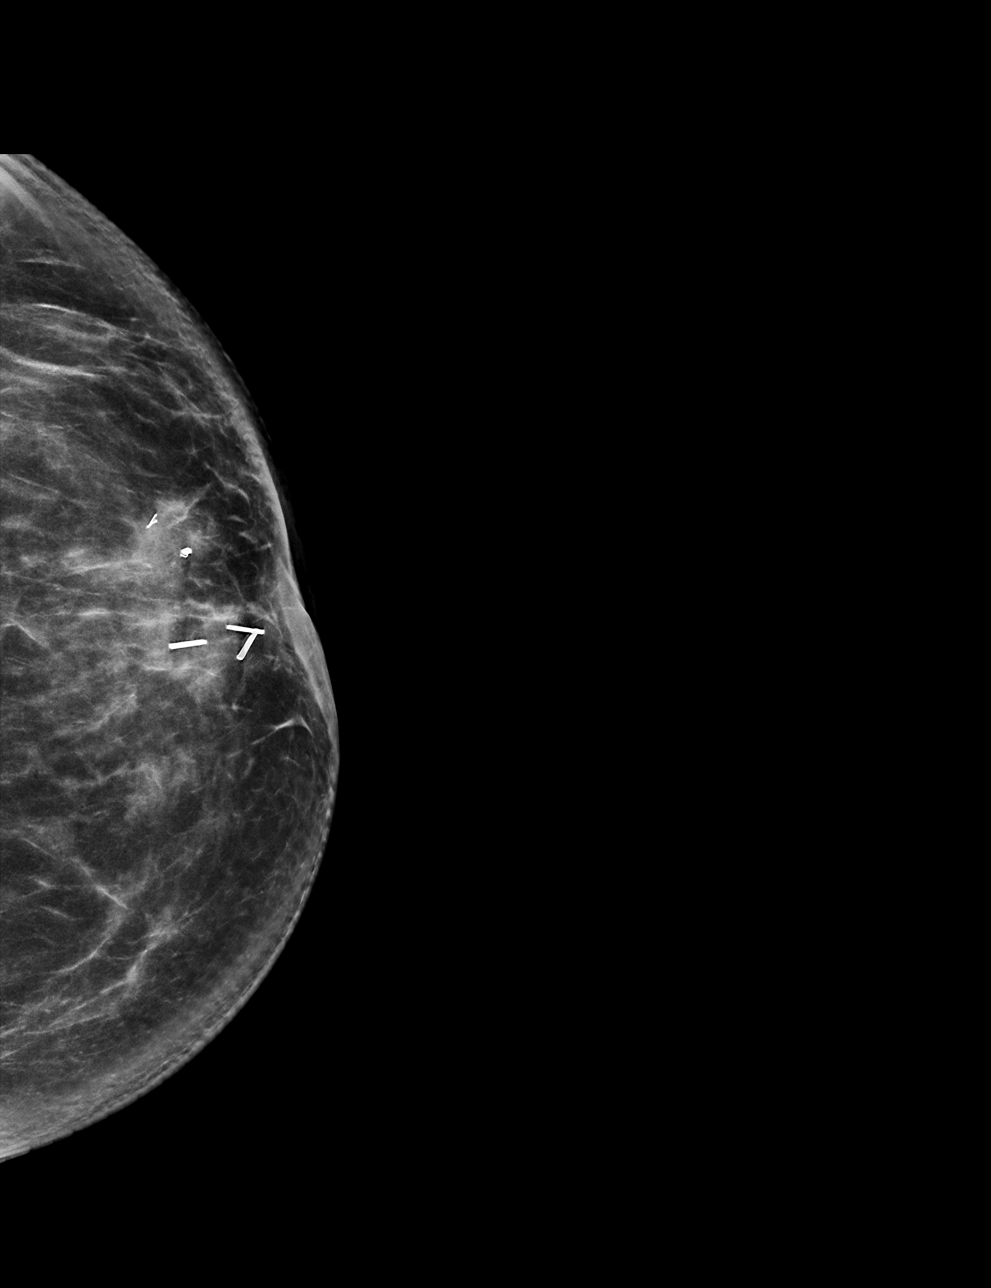

[L ML tomo · tomo slice 43/86.0]
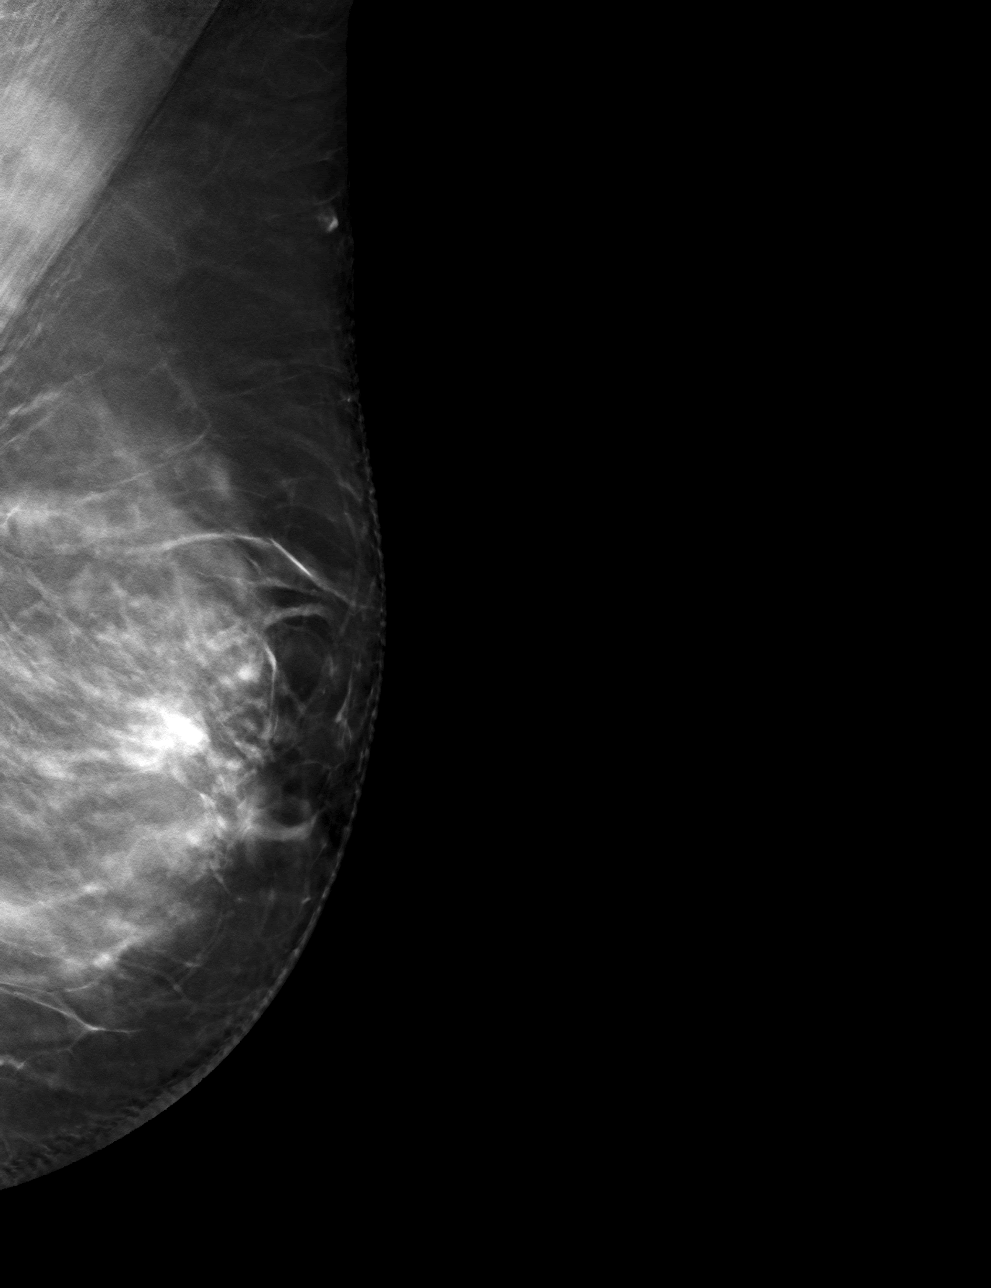

[L CC tomo · tomo slice 43/85.0]
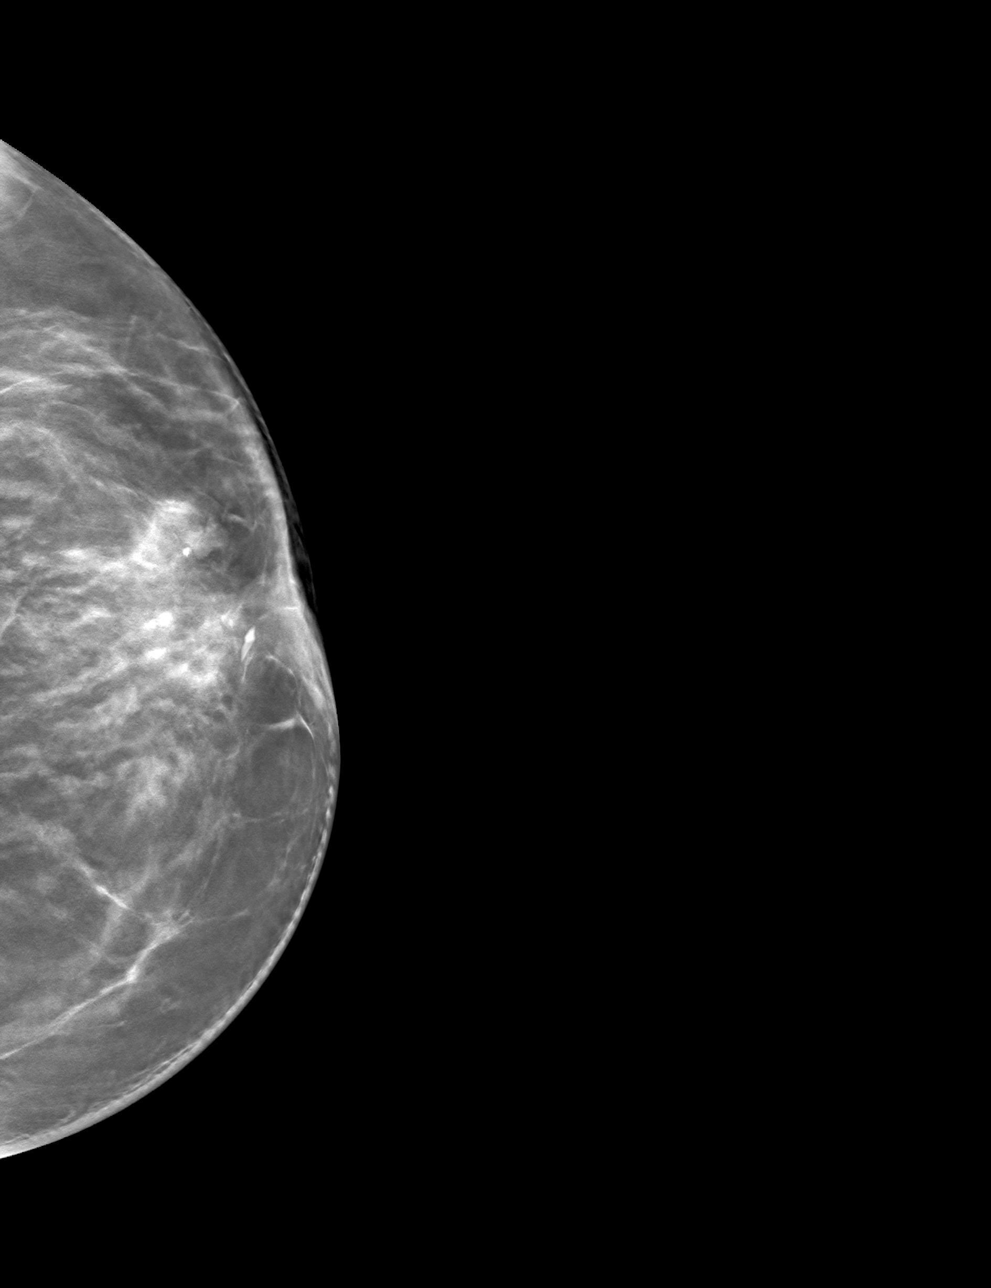

[4 of 12 positions shown; findings below may reference images not displayed]

FINDINGS: Mammographic images were obtained following ultrasound guided biopsy
of the left breast x2. Both biopsy marking clips are in the expected
position.
IMPRESSION: Appropriate positioning of both post biopsy marking clips in the
lateral left breast.

Final Assessment: Post Procedure Mammograms for Marker Placement

## 2022-07-27 DIAGNOSIS — Z9013 Acquired absence of bilateral breasts and nipples: Secondary | ICD-10-CM | POA: Diagnosis not present

## 2022-12-16 ENCOUNTER — Ambulatory Visit: Payer: BC Managed Care – PPO | Admitting: Physician Assistant

## 2022-12-16 ENCOUNTER — Encounter: Payer: Self-pay | Admitting: Physician Assistant

## 2022-12-16 VITALS — BP 108/70 | HR 83 | Temp 97.2°F | Ht 62.75 in | Wt 153.8 lb

## 2022-12-16 DIAGNOSIS — C50412 Malignant neoplasm of upper-outer quadrant of left female breast: Secondary | ICD-10-CM

## 2022-12-16 DIAGNOSIS — Z0000 Encounter for general adult medical examination without abnormal findings: Secondary | ICD-10-CM | POA: Diagnosis not present

## 2022-12-16 DIAGNOSIS — Z1211 Encounter for screening for malignant neoplasm of colon: Secondary | ICD-10-CM | POA: Diagnosis not present

## 2022-12-16 DIAGNOSIS — E559 Vitamin D deficiency, unspecified: Secondary | ICD-10-CM | POA: Diagnosis not present

## 2022-12-16 DIAGNOSIS — Z171 Estrogen receptor negative status [ER-]: Secondary | ICD-10-CM

## 2022-12-16 DIAGNOSIS — R3129 Other microscopic hematuria: Secondary | ICD-10-CM | POA: Diagnosis not present

## 2022-12-16 LAB — POCT URINALYSIS DIP (CLINITEK)
Bilirubin, UA: NEGATIVE
Glucose, UA: NEGATIVE mg/dL
Ketones, POC UA: NEGATIVE mg/dL
Leukocytes, UA: NEGATIVE
Nitrite, UA: NEGATIVE
POC PROTEIN,UA: NEGATIVE
Spec Grav, UA: 1.025 (ref 1.010–1.025)
Urobilinogen, UA: 0.2 U/dL
pH, UA: 5.5 (ref 5.0–8.0)

## 2022-12-16 NOTE — Progress Notes (Signed)
Subjective:  Patient ID: Kathryn [name], female    DOB: 02-06-1961  Age: 62 y.o. MRN: [id number]  Chief Complaint  Patient presents with   Annual Exam    HPI Well Adult Physical: Patient here for a comprehensive physical exam.The patient reports  has had some mild fatigue Do you take any herbs or supplements that were not prescribed by a doctor? no Are you taking calcium supplements? no Are you taking aspirin daily? no  Encounter for general adult medical examination without abnormal findings  Physical ("At Risk" items are starred): Patient's last physical exam was 1 year ago .  Patient is not afflicted from Stress Incontinence and Urge Incontinence  Patient wears a seat belts Patient has smoke detectors and has carbon monoxide detectors. Dental Care: brushes and flosses daily. Last dental visit: up to date Vision impairments: wears glasses Ophthalmology/Optometry: Annual visit.  Hearing loss: none  No LMP recorded. Patient is postmenopausal.  Safe at home: Yes Self breast exams: has had bilateral mastectomy Last pap: up to date Last mammogram: NA - bilateral mastectomy     12/16/2022   10:19 AM 02/20/2021   10:17 AM 11/12/2020    2:05 PM  Depression screen PHQ 2/9  Decreased Interest 0 0 0  Down, Depressed, Hopeless 0 0 0  PHQ - 2 Score 0 0 0  Altered sleeping 0    Tired, decreased energy 1    Change in appetite 1    Feeling bad or failure about yourself  0    Trouble concentrating 0    Moving slowly or fidgety/restless 0    Suicidal thoughts 0    PHQ-9 Score 2    Difficult doing work/chores Not difficult at all           07/30/2020    9:00 PM 12/05/2020    1:32 PM 06/12/2021   10:00 AM 12/15/2021    3:00 PM 12/16/2022   10:18 AM  Fall Risk  Falls in the past year?     0  Was there an injury with Fall?     0  Fall Risk Category Calculator     0  (RETIRED) Patient Fall Risk Level Moderate fall risk Low fall risk Low fall risk Low fall risk   Patient at Risk  for Falls Due to     No Fall Risks  Fall risk Follow up     Falls evaluation completed             Social Hx   Social History   Socioeconomic History   Marital status: Married    Spouse name: Not on file   Number of children: Not on file   Years of education: Not on file   Highest education level: Some college, no degree  Occupational History   Not on file  Tobacco Use   Smoking status: Never   Smokeless tobacco: Never  Vaping Use   Vaping status: Never Used  Substance and Sexual Activity   Alcohol use: Not Currently    Comment: I may have a drink every few months   Drug use: Never   Sexual activity: Yes    Birth control/protection: Surgical    Comment: Tubes tied  Other Topics Concern   Not on file  Social History Narrative   Not on file   Social Determinants of Health   Financial Resource Strain: Low Risk  (12/15/2022)   Overall Financial Resource Strain (CARDIA)    Difficulty of Paying Living Expenses:  Not very hard  Food Insecurity: No Food Insecurity (12/15/2022)   Hunger Vital Sign    Worried About Running Out of Food in the Last Year: Never true    Ran Out of Food in the Last Year: Never true  Transportation Needs: No Transportation Needs (12/15/2022)   PRAPARE - Administrator, Civil Service (Medical): No    Lack of Transportation (Non-Medical): No  Physical Activity: Insufficiently Active (12/15/2022)   Exercise Vital Sign    Days of Exercise per Week: 3 days    Minutes of Exercise per Session: 30 min  Stress: Stress Concern Present (12/15/2022)   [employer] of Occupational Health - Occupational Stress Questionnaire    Feeling of Stress : To some extent  Social Connections: Moderately Integrated (12/15/2022)   Social Connection and Isolation Panel [NHANES]    Frequency of Communication with Friends and Family: More than three times a week    Frequency of Social Gatherings with Friends and Family: Three times a week    Attends  Religious Services: More than 4 times per year    Active Member of Clubs or Organizations: No    Attends Banker Meetings: Never    Marital Status: Married   Past Medical History:  Diagnosis Date   Anemia    during chemo   Breast cancer (HCC)    stage 3 - left   Family history of kidney cancer    Family history of melanoma    GERD (gastroesophageal reflux disease)    resolved   Headache    hormone related, none since 1st child was born    Personal history of chemotherapy    Personal history of radiation therapy    Pleurisy    Past Surgical History:  Procedure Laterality Date   AXILLARY LYMPH NODE DISSECTION Left 10/11/2018   Procedure: LEFT AXILLARY LYMPH NODE DISSECTION;  Surgeon: Harriette Bouillon, MD;  Location: [facility];  Service: General;  Laterality: Left;   BREAST LUMPECTOMY Left 08/2018   BREAST LUMPECTOMY WITH RADIOACTIVE SEED AND SENTINEL LYMPH NODE BIOPSY Left 09/23/2018   Procedure: LEFT BREAST LUMPECTOMY WITH RADIOACTIVE SEED AND LEFT AXILLARY TARGETED LYMPH NODE BIOPY AND  LEFT AXILLARY SENTINEL LYMPH NODE MAPPING;  Surgeon: Harriette Bouillon, MD;  Location: [facility];  Service: General;  Laterality: Left;   CESAREAN SECTION     x3   FRACTURE SURGERY  2020   humerus - w/ Vania Rea. Supple, MD   ORIF HUMERUS FRACTURE Right 01/31/2019   Procedure: OPEN REDUCTION INTERNAL FIXATION (ORIF) Right 3 part proximal humerus fracture;  Surgeon: Francena Hanly, MD;  Location: [facility];  Service: Orthopedics;  Laterality: Right;    PORT-A-CATH REMOVAL Right 07/18/2019   Procedure: REMOVAL PORT-A-CATH;  Surgeon: Harriette Bouillon, MD;  Location: [facility];  Service: General;  Laterality: Right;   PORTACATH PLACEMENT N/A 04/28/2018   Procedure: INSERTION PORT-A-CATH WITH ULTRASOUND;  Surgeon: Harriette Bouillon, MD;  Location: [facility];  Service: General;  Laterality: N/A;   PORTACATH PLACEMENT N/A 04/25/2020   Procedure: INSERTION PORT-A-CATH;   Surgeon: Harriette Bouillon, MD;  Location: [facility];  Service: General;  Laterality: N/A;  60 MIN IN LDOW PLEASE   SENTINEL NODE BIOPSY Left 07/30/2020   Procedure: LEFT SENTINEL NODE BIOPSY;  Surgeon: Harriette Bouillon, MD;  Location: [facility];  Service: General;  Laterality: Left;   SIMPLE MASTECTOMY WITH AXILLARY SENTINEL NODE BIOPSY Bilateral 07/30/2020   Procedure: BILATERAL SIMPLE MASTECTOMY;  Surgeon: Luisa Hart,  Maisie Fus, MD;  Location: [facility];  Service: General;  Laterality: Bilateral;   TUBAL LIGATION      Family History  Problem Relation Age of Onset   Arthritis Mother    Vision loss Mother    COPD Father    Diabetes Father    Heart disease Father    Kidney cancer Sister 5       d. 25   Cancer Sister    Lung cancer Paternal Uncle    Melanoma Sister    Melanoma Sister     ROS CONSTITUTIONAL: see HPI  E/N/T: Negative for ear pain, nasal congestion and sore throat.  CARDIOVASCULAR: Negative for chest pain, dizziness, palpitations and pedal edema.  RESPIRATORY: Negative for recent cough and dyspnea.  GASTROINTESTINAL: Negative for abdominal pain, acid reflux symptoms, constipation, diarrhea, nausea and vomiting.  MSK: Negative for arthralgias and myalgias.  INTEGUMENTARY: Negative for rash.  NEUROLOGICAL: Negative for dizziness and headaches.  PSYCHIATRIC: Negative for sleep disturbance and to question depression screen.  Negative for depression, negative for anhedonia.   Objective:  PHYSICAL EXAM:   BP 108/70 (BP Location: Left Arm, Patient Position: Sitting, Cuff Size: Large)   Pulse 83   Temp (!) 97.2 F (36.2 C) (Temporal)   Ht 5' 2.75" (1.594 m)   Wt 153 lb 12.8 oz (69.8 kg)   SpO2 99%   BMI 27.46 kg/m   Vision Screening   Right eye Left eye Both eyes  Without correction     With correction 20/20 20/20 20/20     GEN: Well nourished, well developed, in no acute distress  HEENT: normal external ears and nose - normal external auditory canals and TMS - hearing grossly  normal - - Lips, Teeth and Gums - normal  Oropharynx - normal mucosa, palate, and posterior pharynx Neck: no JVD or masses - no thyromegaly Cardiac: RRR; no murmurs, rubs, or gallops,no edema - no significant varicosities Respiratory:  normal respiratory rate and pattern with no distress - normal breath sounds with no rales, rhonchi, wheezes or rubs GI: normal bowel sounds, no masses or tenderness MS: no deformity or atrophy  Skin: warm and dry, no rash  Neuro:  Alert and Oriented x 3,  - CN II-Xii grossly intact Psych: euthymic mood, appropriate affect and demeanor  Office Visit on 12/16/2022  Component Date Value Ref Range Status   Color, UA 12/16/2022 yellow  yellow Final   Clarity, UA 12/16/2022 clear  clear Final   Glucose, UA 12/16/2022 negative  negative mg/dL Final   Bilirubin, UA 21/30/8657 negative  negative Final   Ketones, POC UA 12/16/2022 negative  negative mg/dL Final   Spec Grav, UA 84/69/6295 1.025  1.010 - 1.025 Final   Blood, UA 12/16/2022 large (A)  negative Final   pH, UA 12/16/2022 5.5  5.0 - 8.0 Final   POC PROTEIN,UA 12/16/2022 negative  negative, trace Final   Urobilinogen, UA 12/16/2022 0.2  0.2 or 1.0 E.U./dL Final   Nitrite, UA 28/41/3244 Negative  Negative Final   Leukocytes, UA 12/16/2022 Negative  Negative Final     Assessment & Plan:  Annual physical exam -     CBC with Differential/Platelet -     Comprehensive metabolic panel -     TSH -     Lipid panel -     VITAMIN D 25 Hydroxy (Vit-D Deficiency, Fractures) -     POCT URINALYSIS DIP (CLINITEK)  Malignant neoplasm of upper-outer quadrant of left breast in female, estrogen receptor negative (  HCC) Follow up with oncology as directed Colon cancer screening -     Ambulatory referral to Gastroenterology  Vitamin D deficiency -     VITAMIN D 25 Hydroxy (Vit-D Deficiency, Fractures)  Asymptomatic hematuria Urine culture pending Repeat ua in 3 weeks  This is a list of the screening  recommended for you and due dates:  Health Maintenance  Topic Date Due   Colon Cancer Screening  Never done   Zoster (Shingles) Vaccine (1 of 2) 03/18/2023*   Flu Shot  05/24/2023*   Pap with HPV screening  02/20/2026   DTaP/Tdap/Td vaccine (2 - Td or Tdap) 02/21/2031   HPV Vaccine  Aged Out   Mammogram  Discontinued   COVID-19 Vaccine  Discontinued   Hepatitis C Screening  Discontinued   HIV Screening  Discontinued  *Topic was postponed. The date shown is not the original due date.     Follow-up: Return in about 1 year (around 12/16/2023) for fasting physical - 20 min.  An After Visit Summary was printed and given to the patient.  Jettie Pagan [facility] [phone]

## 2022-12-17 LAB — CBC WITH DIFFERENTIAL/PLATELET
Basophils Absolute: 0 10*3/uL (ref 0.0–0.2)
Basos: 1 %
EOS (ABSOLUTE): 0.1 10*3/uL (ref 0.0–0.4)
Eos: 2 %
Hematocrit: 39.3 % (ref 34.0–46.6)
Hemoglobin: 12.5 g/dL (ref 11.1–15.9)
Immature Grans (Abs): 0 10*3/uL (ref 0.0–0.1)
Immature Granulocytes: 0 %
Lymphocytes Absolute: 2.2 10*3/uL (ref 0.7–3.1)
Lymphs: 39 %
MCH: 30.2 pg (ref 26.6–33.0)
MCHC: 31.8 g/dL (ref 31.5–35.7)
MCV: 95 fL (ref 79–97)
Monocytes Absolute: 0.4 10*3/uL (ref 0.1–0.9)
Monocytes: 8 %
Neutrophils Absolute: 2.9 10*3/uL (ref 1.4–7.0)
Neutrophils: 50 %
Platelets: 247 10*3/uL (ref 150–450)
RBC: 4.14 x10E6/uL (ref 3.77–5.28)
RDW: 12.9 % (ref 11.7–15.4)
WBC: 5.6 10*3/uL (ref 3.4–10.8)

## 2022-12-17 LAB — LIPID PANEL
Chol/HDL Ratio: 2.8 ratio (ref 0.0–4.4)
Cholesterol, Total: 207 mg/dL — ABNORMAL HIGH (ref 100–199)
HDL: 75 mg/dL (ref >39)
LDL Chol Calc (NIH): 122 mg/dL — ABNORMAL HIGH (ref 0–99)
Triglycerides: 55 mg/dL (ref 0–149)
VLDL Cholesterol Cal: 10 mg/dL (ref 5–40)

## 2022-12-17 LAB — COMPREHENSIVE METABOLIC PANEL
ALT: 16 [IU]/L (ref 0–32)
AST: 20 [IU]/L (ref 0–40)
Albumin: 4.4 g/dL (ref 3.8–4.9)
Alkaline Phosphatase: 92 [IU]/L (ref 44–121)
BUN/Creatinine Ratio: 26 (ref 12–28)
BUN: 16 mg/dL (ref 8–27)
Bilirubin Total: 0.6 mg/dL (ref 0.0–1.2)
CO2: 22 mmol/L (ref 20–29)
Calcium: 9.1 mg/dL (ref 8.7–10.3)
Chloride: 101 mmol/L (ref 96–106)
Creatinine, Ser: 0.62 mg/dL (ref 0.57–1.00)
Globulin, Total: 2.4 g/dL (ref 1.5–4.5)
Glucose: 82 mg/dL (ref 70–99)
Potassium: 4.6 mmol/L (ref 3.5–5.2)
Sodium: 138 mmol/L (ref 134–144)
Total Protein: 6.8 g/dL (ref 6.0–8.5)
eGFR: 102 mL/min/{1.73_m2} (ref >59)

## 2022-12-17 LAB — URINE CULTURE

## 2022-12-17 LAB — VITAMIN D 25 HYDROXY (VIT D DEFICIENCY, FRACTURES): Vit D, 25-Hydroxy: 32.4 ng/mL (ref 30.0–100.0)

## 2022-12-17 LAB — TSH: TSH: 1.2 u[IU]/mL (ref 0.450–4.500)

## 2023-01-06 ENCOUNTER — Other Ambulatory Visit: Payer: Self-pay | Admitting: Physician Assistant

## 2023-01-06 ENCOUNTER — Ambulatory Visit: Payer: BC Managed Care – PPO

## 2023-01-06 ENCOUNTER — Encounter: Payer: Self-pay | Admitting: Physician Assistant

## 2023-01-06 DIAGNOSIS — R3129 Other microscopic hematuria: Secondary | ICD-10-CM | POA: Diagnosis not present

## 2023-01-06 DIAGNOSIS — R3121 Asymptomatic microscopic hematuria: Secondary | ICD-10-CM

## 2023-01-06 LAB — POCT URINALYSIS DIP (CLINITEK)
Bilirubin, UA: NEGATIVE
Glucose, UA: NEGATIVE mg/dL
Ketones, POC UA: NEGATIVE mg/dL
Leukocytes, UA: NEGATIVE
Nitrite, UA: NEGATIVE
POC PROTEIN,UA: NEGATIVE
Spec Grav, UA: 1.02 (ref 1.010–1.025)
Urobilinogen, UA: 0.2 U/dL
pH, UA: 6 (ref 5.0–8.0)

## 2023-01-06 NOTE — Addendum Note (Signed)
Addended by: Estell Harpin on: 01/06/2023 10:48 AM   Modules accepted: Orders

## 2023-01-14 ENCOUNTER — Encounter: Payer: Self-pay | Admitting: Physician Assistant

## 2023-02-18 ENCOUNTER — Encounter: Payer: Self-pay | Admitting: Physician Assistant

## 2023-02-18 DIAGNOSIS — R3121 Asymptomatic microscopic hematuria: Secondary | ICD-10-CM | POA: Diagnosis not present

## 2023-02-18 DIAGNOSIS — R8271 Bacteriuria: Secondary | ICD-10-CM | POA: Diagnosis not present

## 2023-03-09 DIAGNOSIS — R3121 Asymptomatic microscopic hematuria: Secondary | ICD-10-CM | POA: Diagnosis not present

## 2023-03-17 DIAGNOSIS — R3129 Other microscopic hematuria: Secondary | ICD-10-CM | POA: Diagnosis not present

## 2023-03-17 DIAGNOSIS — R3121 Asymptomatic microscopic hematuria: Secondary | ICD-10-CM | POA: Diagnosis not present

## 2023-03-17 DIAGNOSIS — K449 Diaphragmatic hernia without obstruction or gangrene: Secondary | ICD-10-CM | POA: Diagnosis not present

## 2023-04-07 DIAGNOSIS — R3121 Asymptomatic microscopic hematuria: Secondary | ICD-10-CM | POA: Diagnosis not present

## 2023-04-13 ENCOUNTER — Encounter: Payer: Self-pay | Admitting: Urology

## 2023-04-13 ENCOUNTER — Encounter: Payer: Self-pay | Admitting: Physician Assistant

## 2023-04-28 ENCOUNTER — Ambulatory Visit (AMBULATORY_SURGERY_CENTER): Payer: BC Managed Care – PPO | Admitting: *Deleted

## 2023-04-28 VITALS — Ht 63.0 in | Wt 145.0 lb

## 2023-04-28 DIAGNOSIS — Z1211 Encounter for screening for malignant neoplasm of colon: Secondary | ICD-10-CM

## 2023-04-28 MED ORDER — SUFLAVE 178.7 G PO SOLR: 1.0000 | Freq: Once | ORAL | 0 refills | Status: DC

## 2023-04-28 MED ORDER — SUFLAVE 178.7 G PO SOLR: 1.0000 | Freq: Once | ORAL | 0 refills | Status: AC

## 2023-04-28 NOTE — Progress Notes (Signed)
 Pt's name and DOB verified at the beginning of the pre-visit wit 2 identifiers   Pt denies any difficulty with ambulating,sitting, laying down or rolling side to side  Pt has no issues with ambulation   Pt has no issues moving head neck or swallowing  No egg or soy allergy known to patient   No issues known to pt with past sedation with any surgeries or procedures  Pt denies having issues being intubated  No FH of Malignant Hyperthermia  Pt is not on diet pills or shots  Pt is not on home 02   Pt is not on blood thinners   Pt denies issues with constipation   Pt is not on dialysis  Pt denise any abnormal heart rhythms   Pt denies any upcoming cardiac testing  Patient's chart reviewed by Cathlyn Parsons CNRA prior to pre-visit and patient appropriate for the [facility].  Pre-visit completed and red dot placed by patient's name on their procedure day (on provider's schedule).     Visit by phone  Pt states weight is 145 lb    IInstructions reviewed. Pt given [facility], [facility] main # and MD on call # prior to instructions.  Pt states understanding of instructions. Instructed to review again prior to procedure. Pt states they will.   Informed pt that they will receive a text or  call from [facility] regarding there prep med.

## 2023-05-12 ENCOUNTER — Encounter: Payer: Self-pay | Admitting: Gastroenterology

## 2023-05-17 ENCOUNTER — Telehealth: Payer: Self-pay | Admitting: Gastroenterology

## 2023-05-17 NOTE — Telephone Encounter (Signed)
 Returned pts call and advised her that she is ok to have procedure tomorrow.

## 2023-05-17 NOTE — Telephone Encounter (Signed)
 Patient called stated she has a procedure scheduled for tomorrow and she had a small amount of baked beans on Friday. Please advise.

## 2023-05-18 ENCOUNTER — Ambulatory Visit (AMBULATORY_SURGERY_CENTER): Payer: BC Managed Care – PPO | Admitting: Gastroenterology

## 2023-05-18 ENCOUNTER — Encounter: Payer: Self-pay | Admitting: Gastroenterology

## 2023-05-18 VITALS — BP 88/40 | HR 78 | Temp 97.9°F | Resp 10 | Ht 63.0 in | Wt 145.0 lb

## 2023-05-18 DIAGNOSIS — Z1211 Encounter for screening for malignant neoplasm of colon: Secondary | ICD-10-CM

## 2023-05-18 MED ORDER — SODIUM CHLORIDE 0.9 % IV SOLN: 500.0000 mL | Freq: Once | INTRAVENOUS | Status: DC

## 2023-05-18 NOTE — Patient Instructions (Addendum)
Repeat colonoscopy in 10 years.   YOU HAD AN ENDOSCOPIC PROCEDURE TODAY AT [facility]:   Refer to the procedure report that was given to you for any specific questions about what was found during the examination.  If the procedure report does not answer your questions, please call your gastroenterologist to clarify.  If you requested that your care partner not be given the details of your procedure findings, then the procedure report has been included in a sealed envelope for you to review at your convenience later.  YOU SHOULD EXPECT: Some feelings of bloating in the abdomen. Passage of more gas than usual.  Walking can help get rid of the air that was put into your GI tract during the procedure and reduce the bloating. If you had a lower endoscopy (such as a colonoscopy or flexible sigmoidoscopy) you may notice spotting of blood in your stool or on the toilet paper. If you underwent a bowel prep for your procedure, you may not have a normal bowel movement for a few days.  Please Note:  You might notice some irritation and congestion in your nose or some drainage.  This is from the oxygen used during your procedure.  There is no need for concern and it should clear up in a day or so.  SYMPTOMS TO REPORT IMMEDIATELY:  Following lower endoscopy (colonoscopy or flexible sigmoidoscopy):  Excessive amounts of blood in the stool  Significant tenderness or worsening of abdominal pains  Swelling of the abdomen that is new, acute  Fever of 100F or higher  For urgent or emergent issues, a gastroenterologist can be reached at any hour by calling [phone]. Do not use MyChart messaging for urgent concerns.    DIET:  We do recommend a small meal at first, but then you may proceed to your regular diet.  Drink plenty of fluids but you should avoid alcoholic beverages for 24 hours.  ACTIVITY:  You should plan to take it easy for the rest of today and you should NOT DRIVE or use heavy  machinery until tomorrow (because of the sedation medicines used during the test).    FOLLOW UP: Our staff will call the number listed on your records the next business day following your procedure.  We will call around 7:15- 8:00 am to check on you and address any questions or concerns that you may have regarding the information given to you following your procedure. If we do not reach you, we will leave a message.     If any biopsies were taken you will be contacted by phone or by letter within the next 1-3 weeks.  Please call us at [phone] if you have not heard about the biopsies in 3 weeks.    SIGNATURES/CONFIDENTIALITY: You and/or your care partner have signed paperwork which will be entered into your electronic medical record.  These signatures attest to the fact that that the information above on your After Visit Summary has been reviewed and is understood.  Full responsibility of the confidentiality of this discharge information lies with you and/or your care-partner.

## 2023-05-18 NOTE — Progress Notes (Signed)
 To pacu, VSS. Report to [email or web address]

## 2023-05-18 NOTE — Progress Notes (Signed)
 Pt's states no medical or surgical changes since previsit or office visit.

## 2023-05-18 NOTE — Op Note (Signed)
 [facility] Patient Name: Kathryn [name] Procedure Date: 05/18/2023 7:19 AM MRN: [id number] Endoscopist: Sherilyn Cooter L. Myrtie Neither , MD, [id number] Age: 63 Referring MD:  Date of Birth: March 17, 1960 Gender: Female Account #: [id number] Procedure:                Colonoscopy Indications:              Screening for colorectal malignant neoplasm, This                            is the patient's first colonoscopy Medicines:                Monitored Anesthesia Care Procedure:                Pre-Anesthesia Assessment:                           - Prior to the procedure, a History and Physical                            was performed, and patient medications and                            allergies were reviewed. The patient's tolerance of                            previous anesthesia was also reviewed. The risks                            and benefits of the procedure and the sedation                            options and risks were discussed with the patient.                            All questions were answered, and informed consent                            was obtained. Prior Anticoagulants: The patient has                            taken no anticoagulant or antiplatelet agents. ASA                            Grade Assessment: II - A patient with mild systemic                            disease. After reviewing the risks and benefits,                            the patient was deemed in satisfactory condition to                            undergo the procedure.  After obtaining informed consent, the colonoscope                            was passed under direct vision. Throughout the                            procedure, the patient's blood pressure, pulse, and                            oxygen saturations were monitored continuously. The                            Olympus Scope [id number] was introduced through the                            anus and advanced to  the the cecum, identified by                            appendiceal orifice and ileocecal valve. The                            colonoscopy was performed without difficulty. The                            patient tolerated the procedure well. The quality                            of the bowel preparation was excellent. The                            ileocecal valve, appendiceal orifice, and rectum                            were photographed. Scope In: 8:04:16 AM Scope Out: 8:18:43 AM Scope Withdrawal Time: 0 hours 10 minutes 0 seconds  Total Procedure Duration: 0 hours 14 minutes 27 seconds  Findings:                 The perianal and digital rectal examinations were                            normal.                           Repeat examination of right colon under NBI                            performed.                           The entire examined colon appeared normal on direct                            and retroflexion views. Complications:            No immediate complications. Estimated Blood Loss:  Estimated blood loss: none. Impression:               - The entire examined colon is normal on direct and                            retroflexion views.                           - No specimens collected. Recommendation:           - Patient has a contact number available for                            emergencies. The signs and symptoms of potential                            delayed complications were discussed with the                            patient. Return to normal activities tomorrow.                            Written discharge instructions were provided to the                            patient.                           - Resume previous diet.                           - Continue present medications.                           - Repeat colonoscopy in 10 years for screening                            purposes. Allexis Bordenave L. Myrtie Neither, MD 05/18/2023 8:21:55 AM This report  has been signed electronically.

## 2023-05-18 NOTE — Progress Notes (Signed)
 History and Physical:  This patient presents for endoscopic testing for: Encounter Diagnosis  Name Primary?   Special screening for malignant neoplasms, colon Yes    Average risk for colorectal cancer.  1st screening exam.  Patient denies chronic abdominal pain, rectal bleeding, constipation or diarrhea.   Patient is otherwise without complaints or active issues today.   Past Medical History: Past Medical History:  Diagnosis Date   Anemia    during chemo   Breast cancer (HCC)    stage 3 - left   Family history of kidney cancer    Family history of melanoma    GERD (gastroesophageal reflux disease)    resolved   Headache    hormone related, none since 1st child was born    Hyperlipidemia    Osteoporosis    Personal history of chemotherapy    Personal history of radiation therapy    Pleurisy      Past Surgical History: Past Surgical History:  Procedure Laterality Date   AXILLARY LYMPH NODE DISSECTION Left 10/11/2018   Procedure: LEFT AXILLARY LYMPH NODE DISSECTION;  Surgeon: Harriette Bouillon, MD;  [facility];  Service: General;  Laterality: Left;   BREAST LUMPECTOMY Left 08/2018   BREAST LUMPECTOMY WITH RADIOACTIVE SEED AND SENTINEL LYMPH NODE BIOPSY Left 09/23/2018   Procedure: LEFT BREAST LUMPECTOMY WITH RADIOACTIVE SEED AND LEFT AXILLARY TARGETED LYMPH NODE BIOPY AND  LEFT AXILLARY SENTINEL LYMPH NODE MAPPING;  Surgeon: Harriette Bouillon, MD;  Location: [facility];  Service: General;  Laterality: Left;   CESAREAN SECTION     x3   FRACTURE SURGERY  2020   humerus - w/ Vania Rea. Supple, MD   ORIF HUMERUS FRACTURE Right 01/31/2019   Procedure: OPEN REDUCTION INTERNAL FIXATION (ORIF) Right 3 part proximal humerus fracture;  Surgeon: Francena Hanly, MD;  Location: [facility];  Service: Orthopedics;  Laterality: Right;    PORT-A-CATH REMOVAL Right 07/18/2019   Procedure: REMOVAL PORT-A-CATH;  Surgeon: Harriette Bouillon, MD;  Location: [facility];  Service: General;  Laterality: Right;   PORTACATH PLACEMENT N/A 04/28/2018   Procedure: INSERTION PORT-A-CATH WITH ULTRASOUND;  Surgeon: Harriette Bouillon, MD;  [facility];  Service: General;  Laterality: N/A;   PORTACATH PLACEMENT N/A 04/25/2020   Procedure: INSERTION PORT-A-CATH;  Surgeon: Harriette Bouillon, MD;  Location: [facility];  Service: General;  Laterality: N/A;  60 MIN IN LDOW PLEASE   SENTINEL NODE BIOPSY Left 07/30/2020   Procedure: LEFT SENTINEL NODE BIOPSY;  Surgeon: Harriette Bouillon, MD;  [facility];  Service: General;  Laterality: Left;   SIMPLE MASTECTOMY WITH AXILLARY SENTINEL NODE BIOPSY Bilateral 07/30/2020   Procedure: BILATERAL SIMPLE MASTECTOMY;  Surgeon: Harriette Bouillon, MD;  [facility];  Service: General;  Laterality: Bilateral;   TUBAL LIGATION      Allergies: No Known Allergies  Outpatient Meds: Current Outpatient Medications  Medication Sig Dispense Refill   cholecalciferol (VITAMIN D3) 25 MCG (1000 UNIT) tablet Take 1,000 Units by mouth in the morning.     loratadine (ALLERGY RELIEF) 10 MG tablet Take 1 tablet (10 mg total) by mouth in the morning. (Patient taking differently: Take 1 tablet (10 mg total) by mouth in the morning. As needed) 90 tablet 3   Current Facility-Administered Medications  Medication Dose Route Frequency Provider Last Rate Last Admin   0.9 %  sodium chloride infusion  500 mL Intravenous Once Sherrilyn Rist, MD          ___________________________________________________________________  Objective   Exam:  BP 124/75   Pulse 78   Temp 97.9 F (36.6 C)   Ht 5\' 3"  (1.6 m)   Wt 145 lb (65.8 kg)   SpO2 99%   BMI 25.69 kg/m   CV: regular , S1/S2 Resp: clear to auscultation bilaterally, normal RR and effort noted GI: soft, no tenderness, with active bowel sounds.   Assessment: Encounter Diagnosis  Name Primary?   Special screening for malignant neoplasms, colon Yes     Plan: Colonoscopy   The  benefits and risks of the planned procedure(s) were described in detail with the patient or (when appropriate) their health care proxy.  Risks were outlined as including, but not limited to, bleeding, infection, perforation, adverse medication reaction leading to cardiac or pulmonary decompensation, pancreatitis (if ERCP).  The limitation of incomplete mucosal visualization was also discussed.  No guarantees or warranties were given.  The patient is appropriate for an endoscopic procedure in the ambulatory setting.   - Amada Jupiter, MD

## 2023-05-19 ENCOUNTER — Telehealth: Payer: Self-pay | Admitting: *Deleted

## 2023-05-19 NOTE — Telephone Encounter (Signed)
  Follow up Call-     05/18/2023    7:29 AM  Call back number  Post procedure Call Back phone  # [phone]  Permission to leave phone message Yes     Patient questions:  Do you have a fever, pain , or abdominal swelling? No. Pain Score  0 *  Have you tolerated food without any problems? Yes.    Have you been able to return to your normal activities? Yes.    Do you have any questions about your discharge instructions: Diet   No. Medications  No. Follow up visit  No.  Do you have questions or concerns about your Care? No.  Actions: * If pain score is 4 or above: No action needed, pain <4.

## 2023-06-17 ENCOUNTER — Encounter: Payer: Self-pay | Admitting: Hematology and Oncology

## 2023-06-17 ENCOUNTER — Inpatient Hospital Stay (HOSPITAL_BASED_OUTPATIENT_CLINIC_OR_DEPARTMENT_OTHER): Payer: BC Managed Care – PPO | Admitting: Hematology and Oncology

## 2023-06-17 ENCOUNTER — Inpatient Hospital Stay: Payer: BC Managed Care – PPO | Attending: Hematology and Oncology

## 2023-06-17 VITALS — BP 120/68 | HR 83 | Temp 97.9°F | Resp 18 | Ht 63.0 in | Wt 147.6 lb

## 2023-06-17 DIAGNOSIS — C50412 Malignant neoplasm of upper-outer quadrant of left female breast: Secondary | ICD-10-CM | POA: Diagnosis not present

## 2023-06-17 DIAGNOSIS — Z923 Personal history of irradiation: Secondary | ICD-10-CM | POA: Diagnosis not present

## 2023-06-17 DIAGNOSIS — R911 Solitary pulmonary nodule: Secondary | ICD-10-CM | POA: Diagnosis not present

## 2023-06-17 DIAGNOSIS — Z171 Estrogen receptor negative status [ER-]: Secondary | ICD-10-CM

## 2023-06-17 DIAGNOSIS — Z853 Personal history of malignant neoplasm of breast: Secondary | ICD-10-CM | POA: Insufficient documentation

## 2023-06-17 DIAGNOSIS — Z9221 Personal history of antineoplastic chemotherapy: Secondary | ICD-10-CM | POA: Diagnosis not present

## 2023-06-17 DIAGNOSIS — I890 Lymphedema, not elsewhere classified: Secondary | ICD-10-CM | POA: Insufficient documentation

## 2023-06-17 DIAGNOSIS — Z9013 Acquired absence of bilateral breasts and nipples: Secondary | ICD-10-CM | POA: Insufficient documentation

## 2023-06-17 LAB — CMP (CANCER CENTER ONLY)
ALT: 14 U/L (ref 0–44)
AST: 16 U/L (ref 15–41)
Albumin: 4.3 g/dL (ref 3.5–5.0)
Alkaline Phosphatase: 73 U/L (ref 38–126)
Anion gap: 4 — ABNORMAL LOW (ref 5–15)
BUN: 16 mg/dL (ref 8–23)
CO2: 30 mmol/L (ref 22–32)
Calcium: 9.2 mg/dL (ref 8.9–10.3)
Chloride: 105 mmol/L (ref 98–111)
Creatinine: 0.62 mg/dL (ref 0.44–1.00)
GFR, Estimated: >60 mL/min (ref >60)
Glucose, Bld: 87 mg/dL (ref 70–99)
Potassium: 4.4 mmol/L (ref 3.5–5.1)
Sodium: 139 mmol/L (ref 135–145)
Total Bilirubin: 0.7 mg/dL (ref 0.0–1.2)
Total Protein: 7.1 g/dL (ref 6.5–8.1)

## 2023-06-17 LAB — CBC WITH DIFFERENTIAL (CANCER CENTER ONLY)
Abs Immature Granulocytes: 0.01 10*3/uL (ref 0.00–0.07)
Basophils Absolute: 0 10*3/uL (ref 0.0–0.1)
Basophils Relative: 1 %
Eosinophils Absolute: 0.1 10*3/uL (ref 0.0–0.5)
Eosinophils Relative: 1 %
HCT: 39 % (ref 36.0–46.0)
Hemoglobin: 12.8 g/dL (ref 12.0–15.0)
Immature Granulocytes: 0 %
Lymphocytes Relative: 38 %
Lymphs Abs: 1.6 10*3/uL (ref 0.7–4.0)
MCH: 30.2 pg (ref 26.0–34.0)
MCHC: 32.8 g/dL (ref 30.0–36.0)
MCV: 92 fL (ref 80.0–100.0)
Monocytes Absolute: 0.4 10*3/uL (ref 0.1–1.0)
Monocytes Relative: 9 %
Neutro Abs: 2.2 10*3/uL (ref 1.7–7.7)
Neutrophils Relative %: 51 %
Platelet Count: 221 10*3/uL (ref 150–400)
RBC: 4.24 MIL/uL (ref 3.87–5.11)
RDW: 13.3 % (ref 11.5–15.5)
WBC Count: 4.3 10*3/uL (ref 4.0–10.5)
nRBC: 0 % (ref 0.0–0.2)

## 2023-06-17 MED ORDER — LORATADINE 10 MG PO TABS: ORAL_TABLET | ORAL | 3 refills | Status: DC

## 2023-06-17 NOTE — Assessment & Plan Note (Signed)
04/06/2018: T2N2 stage IIIc grade 3 IDC triple negative Ki-67 70% (5 cm satellite nodules, 3 abnormal lymph nodes) 05/03/2018-08/09/2018: Neoadjuvant Adriamycin and Cytoxan followed by Taxol and carboplatin x4 (stopped because of neuropathy) 09/23/2018: Left lumpectomy: Residual grade 2 IDC triple negative, 1/1 lymph node positive, axillary dissection 10/11/2018 showed 7 negative lymph nodes 12/14/2018-01/31/2019: Adjuvant radiation with capecitabine, followed by capecitabine maintenance completed 08/04/2019 Genetics: Negative for mutations 04/09/2020: Breast cancer recurrence: Multifocal grade 3 IDC 1.6 cm and 0.4 cm Ki-67 60% (bone scan and CT CAP: No distant mets) 04/30/2020-07/02/2020: TC x4 cycles 07/30/2020: Bilateral mastectomies: Right: Benign, left: Residual T1 microscopic IDC grade 2, margins negative, no reconstruction   Current treatment: Surveillance  Chest wall examination 12/17/2022: Benign   Return to clinic in 1 year for follow-up

## 2023-06-17 NOTE — Progress Notes (Signed)
 Patient Care Team: Cyndi Drain, PA-C as PCP - General (Physician Assistant) Bridgette Campus, MD as PCP - Cardiology (Cardiology) Alane Hsu, RN as Oncology Nurse Navigator Auther Bo, RN as Oncology Nurse Navigator Sim Dryer, MD as Consulting Physician (General Surgery) Magrinat, Rozella Cornfield, MD (Inactive) as Consulting Physician (Oncology) Johna Myers, MD as Consulting Physician (Radiation Oncology) Barb Bonito, MD as Consulting Physician (Plastic Surgery)  DIAGNOSIS:  Encounter Diagnosis  Name Primary?   Malignant neoplasm of upper-outer quadrant of left breast in female, estrogen receptor negative (HCC) Yes    SUMMARY OF ONCOLOGIC HISTORY: Oncology History  Malignant neoplasm of upper-outer quadrant of left breast in female, estrogen receptor negative (HCC)  04/11/2018 Initial Diagnosis   Malignant neoplasm of upper-outer quadrant of left breast in female, estrogen receptor negative (HCC)   05/03/2018 - 08/09/2018 Chemotherapy   The patient had dexamethasone  (DECADRON ) 4 MG tablet, 1 of 1 cycle, Start date: 06/29/2018, End date: 07/26/2018 DOXOrubicin  (ADRIAMYCIN ) chemo injection 108 mg, 60 mg/m2 = 108 mg, Intravenous,  Once, 4 of 4 cycles Dose modification: 50 mg/m2 (original dose 60 mg/m2, Cycle 3, Reason: Provider Judgment), 50 mg/m2 (original dose 60 mg/m2, Cycle 4, Reason: Provider Judgment) Administration: 108 mg (05/03/2018), 108 mg (05/17/2018), 90 mg (06/07/2018), 90 mg (06/28/2018) palonosetron  (ALOXI ) injection 0.25 mg, 0.25 mg, Intravenous,  Once, 1 of 1 cycle Administration: 0.25 mg (05/03/2018) pegfilgrastim  (NEULASTA  ONPRO KIT) injection 6 mg, 6 mg, Subcutaneous, Once, 1 of 1 cycle Administration: 6 mg (06/28/2018) pegfilgrastim -cbqv (UDENYCA ) injection 6 mg, 6 mg, Subcutaneous, Once, 3 of 3 cycles Administration: 6 mg (05/05/2018), 6 mg (05/19/2018), 6 mg (06/09/2018) CARBOplatin  (PARAPLATIN ) 230 mg in sodium chloride  0.9 % 250 mL chemo infusion, 230 mg (11500 %  of original dose 2 mg), Intravenous,  Once, 2 of 4 cycles Dose modification: 2 mg (original dose 2 mg, Cycle 5),   (original dose 2 mg, Cycle 5, Reason: Provider Judgment),   (original dose 2 mg, Cycle 5),   (original dose 230 mg, Cycle 5) Administration: 230 mg (07/20/2018), 230 mg (07/26/2018), 230 mg (08/02/2018), 230 mg (08/09/2018) cyclophosphamide  (CYTOXAN ) 1,080 mg in sodium chloride  0.9 % 250 mL chemo infusion, 600 mg/m2 = 1,080 mg, Intravenous,  Once, 4 of 4 cycles Dose modification: 500 mg/m2 (original dose 600 mg/m2, Cycle 3, Reason: Provider Judgment), 500 mg/m2 (original dose 600 mg/m2, Cycle 4, Reason: Provider Judgment) Administration: 1,080 mg (05/03/2018), 1,080 mg (05/17/2018), 900 mg (06/07/2018), 900 mg (06/28/2018) PACLitaxel  (TAXOL ) 144 mg in sodium chloride  0.9 % 250 mL chemo infusion (</= 80mg /m2), 80 mg/m2 = 144 mg, Intravenous,  Once, 2 of 4 cycles Administration: 144 mg (07/20/2018), 144 mg (07/26/2018), 144 mg (08/09/2018), 144 mg (08/02/2018) fosaprepitant  (EMEND ) 150 mg, dexamethasone  (DECADRON ) 12 mg in sodium chloride  0.9 % 145 mL IVPB, , Intravenous,  Once, 6 of 8 cycles Administration:  (05/03/2018),  (07/20/2018),  (05/17/2018),  (06/07/2018),  (06/28/2018),  (08/09/2018),  (07/26/2018),  (08/02/2018)  for chemotherapy treatment.    04/09/2020 Cancer Staging   Staging form: Breast, AJCC 8th Edition - Clinical stage from 04/09/2020: Stage IB (rcT1c, cN0, cM0, G3, ER-, PR-, HER2-) - Signed by Percival Brace, NP on 04/17/2020 Stage prefix: Recurrence   04/30/2020 - 06/13/2020 Chemotherapy         07/02/2020 - 07/02/2020 Chemotherapy   Patient is on Treatment Plan : BREAST Adjuvant CMF IV q21d       CHIEF COMPLIANT: Surveillance of breast cancer  HISTORY OF PRESENT ILLNESS:   History  of Present Illness The patient, with a history of breast cancer diagnosed in 2020, presents with fatigue and poor sleep. She reports being 'tired' and struggling with sleep, often only managing 'four  hours a night straight through.' The patient attributes this to a busy lifestyle, including work, caring for her elderly mother, and managing numerous medical appointments for family members. She also mentions a 'snore person' in her bed, which may suggest sleep disturbances due to a partner's snoring.  The patient underwent a physical in October and a CT scan in January, which revealed a 3mm nodule in the right lower lobe of the lung. She also had a cystoscopy and colonoscopy, both of which returned clear results. The patient does not smoke, which reduces the likelihood that the lung nodule is malignant.     ALLERGIES:  has no known allergies.  MEDICATIONS:  Current Outpatient Medications  Medication Sig Dispense Refill   cholecalciferol (VITAMIN D3) 25 MCG (1000 UNIT) tablet Take 1,000 Units by mouth in the morning.     loratadine  (ALLERGY RELIEF) 10 MG tablet Take 1 tablet (10 mg total) by mouth in the morning. (Patient not taking: Reported on 06/17/2023) 90 tablet 3   No current facility-administered medications for this visit.    PHYSICAL EXAMINATION: ECOG PERFORMANCE STATUS: 1 - Symptomatic but completely ambulatory  Vitals:   06/17/23 0945  BP: 120/68  Pulse: 83  Resp: 18  Temp: 97.9 F (36.6 C)  SpO2: (!) 10%   Filed Weights   06/17/23 0945  Weight: 147 lb 9.6 oz (67 kg)    Physical Exam   (exam performed in the presence of a chaperone)  LABORATORY DATA:  I have reviewed the data as listed    Latest Ref Rng & Units 12/16/2022   10:57 AM 06/17/2022    2:04 PM 12/15/2021    2:23 PM  CMP  Glucose 70 - 99 mg/dL 82  80  85   BUN 8 - 27 mg/dL 16  16  15    Creatinine 0.57 - 1.00 mg/dL 4.09  8.11  9.14   Sodium 134 - 144 mmol/L 138  140  137   Potassium 3.5 - 5.2 mmol/L 4.6  4.0  4.0   Chloride 96 - 106 mmol/L 101  104  104   CO2 20 - 29 mmol/L 22  30  29    Calcium 8.7 - 10.3 mg/dL 9.1  9.7  9.4   Total Protein 6.0 - 8.5 g/dL 6.8  7.4  7.2   Total Bilirubin 0.0 -  1.2 mg/dL 0.6  0.5  0.5   Alkaline Phos 44 - 121 IU/L 92  73  87   AST 0 - 40 IU/L 20  19  26    ALT 0 - 32 IU/L 16  18  36     Lab Results  Component Value Date   WBC 4.3 06/17/2023   HGB 12.8 06/17/2023   HCT 39.0 06/17/2023   MCV 92.0 06/17/2023   PLT 221 06/17/2023   NEUTROABS 2.2 06/17/2023    ASSESSMENT & PLAN:  Malignant neoplasm of upper-outer quadrant of left breast in female, estrogen receptor negative (HCC) 04/06/2018: T2N2 stage IIIc grade 3 IDC triple negative Ki-67 70% (5 cm satellite nodules, 3 abnormal lymph nodes) 05/03/2018-08/09/2018: Neoadjuvant Adriamycin  and Cytoxan  followed by Taxol  and carboplatin  x4 (stopped because of neuropathy) 09/23/2018: Left lumpectomy: Residual grade 2 IDC triple negative, 1/1 lymph node positive, axillary dissection 10/11/2018 showed 7 negative lymph nodes 12/14/2018-01/31/2019: Adjuvant radiation with  capecitabine , followed by capecitabine  maintenance completed 08/04/2019 Genetics: Negative for mutations 04/09/2020: Breast cancer recurrence: Multifocal grade 3 IDC 1.6 cm and 0.4 cm Ki-67 60% (bone scan and CT CAP: No distant mets) 04/30/2020-07/02/2020: TC x4 cycles 07/30/2020: Bilateral mastectomies: Right: Benign, left: Residual T1 microscopic IDC grade 2, margins negative, no reconstruction   Current treatment: Surveillance  Chest wall examination 12/17/2022: Benign   January 2025: CT abdomen: 3 mm right lower lobe lung nodule.  Since patient is not a smoker I do not recommend annual CT scans for her.  I recommend that we obtain guardant reveal for MRD testing Left arm lymphedema: I sent a new prescription for compression garment. Allergies: On Claritin . Return to clinic in 1 year for follow-up ------------------------------------- Assessment and Plan Assessment & Plan Malignant neoplasm of upper-outer quadrant of left breast Diagnosed with breast cancer in February 2020, currently asymptomatic with no evidence of recurrence. Discussed  Gardant blood test for early detection of recurrence, which can identify recurrence up to a year before clinical manifestation. - Order Gardant blood test for breast cancer recurrence every six months.  Lung nodule A 3 mm nodule identified in the right lower lobe on a January CT scan, likely a non-subpleural lymph node, new compared to 2023 imaging. Such nodules are common and often benign in non-smokers.      No orders of the defined types were placed in this encounter.  The patient has a good understanding of the overall plan. she agrees with it. she will call with any problems that may develop before the next visit here. Total time spent: 30 mins including face to face time and time spent for planning, charting and co-ordination of care   Viinay K Ryenn Howeth, MD 06/17/23

## 2023-06-18 ENCOUNTER — Encounter: Payer: Self-pay | Admitting: *Deleted

## 2023-06-18 NOTE — Progress Notes (Signed)
 Per MD request RN successfully faxed Guardant Reveal orders.

## 2023-06-23 ENCOUNTER — Other Ambulatory Visit: Payer: Self-pay | Admitting: *Deleted

## 2023-06-23 ENCOUNTER — Encounter: Payer: Self-pay | Admitting: Hematology and Oncology

## 2023-06-23 DIAGNOSIS — Z171 Estrogen receptor negative status [ER-]: Secondary | ICD-10-CM

## 2023-06-23 DIAGNOSIS — C50412 Malignant neoplasm of upper-outer quadrant of left female breast: Secondary | ICD-10-CM

## 2023-06-23 NOTE — Progress Notes (Signed)
 Pt requesting referral be placed to cancer rehab for eval and tx of left arm lymphedema and sleeve ordering.  Verbal orders received and placed.

## 2023-06-29 NOTE — Therapy (Incomplete)
 OUTPATIENT PHYSICAL THERAPY  UPPER EXTREMITY ONCOLOGY EVALUATION  Patient Name: Kathryn [name] MRN: [id number] DOB:04/05/1960, 63 y.o., female Today's Date: 06/30/2023  END OF SESSION:  PT End of Session - 06/30/23 1044     Visit Number 1    Number of Visits 1    PT Start Time 1006    PT Stop Time 1036    PT Time Calculation (min) 30 min    Activity Tolerance Patient tolerated treatment well    Behavior During Therapy WFL for tasks assessed/performed             Past Medical History:  Diagnosis Date   Anemia    during chemo   Breast cancer (HCC)    stage 3 - left   Family history of kidney cancer    Family history of melanoma    GERD (gastroesophageal reflux disease)    resolved   Headache    hormone related, none since 1st child was born    Hyperlipidemia    Osteoporosis    Personal history of chemotherapy    Personal history of radiation therapy    Pleurisy    Past Surgical History:  Procedure Laterality Date   AXILLARY LYMPH NODE DISSECTION Left 10/11/2018   Procedure: LEFT AXILLARY LYMPH NODE DISSECTION;  Surgeon: Sim Dryer, MD;  [facility];  Service: General;  Laterality: Left;   BREAST LUMPECTOMY Left 08/2018   BREAST LUMPECTOMY WITH RADIOACTIVE SEED AND SENTINEL LYMPH NODE BIOPSY Left 09/23/2018   Procedure: LEFT BREAST LUMPECTOMY WITH RADIOACTIVE SEED AND LEFT AXILLARY TARGETED LYMPH NODE BIOPY AND  LEFT AXILLARY SENTINEL LYMPH NODE MAPPING;  Surgeon: Sim Dryer, MD;  Location: [facility];  Service: General;  Laterality: Left;   CESAREAN SECTION     x3   FRACTURE SURGERY  2020   humerus - w/ Genevieve Kerry. Supple, MD   ORIF HUMERUS FRACTURE Right 01/31/2019   Procedure: OPEN REDUCTION INTERNAL FIXATION (ORIF) Right 3 part proximal humerus fracture;  Surgeon: Ellard Gunning, MD;  Location: [facility];  Service: Orthopedics;  Laterality: Right;    PORT-A-CATH REMOVAL Right 07/18/2019   Procedure: REMOVAL PORT-A-CATH;  Surgeon:  Sim Dryer, MD;  Location: [facility];  Service: General;  Laterality: Right;   PORTACATH PLACEMENT N/A 04/28/2018   Procedure: INSERTION PORT-A-CATH WITH ULTRASOUND;  Surgeon: Sim Dryer, MD;  [facility];  Service: General;  Laterality: N/A;   PORTACATH PLACEMENT N/A 04/25/2020   Procedure: INSERTION PORT-A-CATH;  Surgeon: Sim Dryer, MD;  Location: [facility];  Service: General;  Laterality: N/A;  60 MIN IN LDOW PLEASE   SENTINEL NODE BIOPSY Left 07/30/2020   Procedure: LEFT SENTINEL NODE BIOPSY;  Surgeon: Sim Dryer, MD;  [facility];  Service: General;  Laterality: Left;   SIMPLE MASTECTOMY WITH AXILLARY SENTINEL NODE BIOPSY Bilateral 07/30/2020   Procedure: BILATERAL SIMPLE MASTECTOMY;  Surgeon: Sim Dryer, MD;  [facility]: [facility];  Service: General;  Laterality: Bilateral;   TUBAL LIGATION     Patient Active Problem List   Diagnosis Date Noted   Vitamin D  deficiency 12/16/2022   Annual physical exam 12/16/2022   Microscopic hematuria 12/16/2022   Bronchopneumonia 01/07/2022   Colon cancer screening 11/12/2020   Family history of kidney cancer 10/11/2020   Recurrent breast cancer, left (HCC) 07/30/2020   Osteoporosis 04/16/2020   Closed fracture of right proximal humerus 01/23/2019   Port-A-Cath in place- removed 2022 05/17/2018   Genetic testing 05/10/2018   Family history of  melanoma    Malignant neoplasm of upper-outer quadrant of left breast in female, estrogen receptor negative (HCC) 04/11/2018   Chest pain 05/13/2015   Shortness of breath 05/13/2015    PCP: Cyndi Drain, PA-C  REFERRING PROVIDER: Dr. Cameron Cea    REFERRING DIAG:  Diagnosis  C50.412,Z17.1 (ICD-10-CM) - Malignant neoplasm of upper-outer quadrant of left breast in female, estrogen receptor negative (HCC)  I89.0 Lymphedema   THERAPY DIAG:  Malignant neoplasm of upper-outer quadrant of left breast in female, estrogen receptor negative (HCC) - Plan: PT plan of  care cert/re-cert  Postmastectomy lymphedema syndrome - Plan: PT plan of care cert/re-cert  ONSET DATE: 04/06/2018  Rationale for Evaluation and Treatment: Rehabilitation  SUBJECTIVE:                                                                                                                                                                                           SUBJECTIVE STATEMENT: I need to get a new sleeve.    PERTINENT HISTORY: Dx with left triple negative IDC in 2020 s/p lumpectomy on 09/23/18 with 1/1 lymph nodes positive followed by ALND on 10/11/18 with all 7 nodes negative. Completed radiation and chemotherapy.  Recurrence in 2022 with bilateral mastectomies 07/30/20. Other Hx: Pt had fracture of proximal humerus with ORIF on 01/31/2019.  PAIN:  Are you having pain? Yes NPRS scale: 2/10 Pain location: left arm  Pain orientation: Left  PAIN TYPE: aching Pain description: intermittent  Aggravating factors: using my arm too much.  Relieving factors: rest, compression, pump  PRECAUTIONS: Lt UE lymphedema   RED FLAGS: None   WEIGHT BEARING RESTRICTIONS: No  FALLS:  Has patient fallen in last 6 months? No  LIVING ENVIRONMENT: Lives with: lives with their family and lives with their spouse  OCCUPATION: still working full time   LEISURE: spending time with her grandson   HAND DOMINANCE: right   PRIOR LEVEL OF FUNCTION: Independent  PATIENT GOALS: get a new sleeve    OBJECTIVE: Note: Objective measures were completed at Evaluation unless otherwise noted.  COGNITION: Overall cognitive status: Within functional limits for tasks assessed   PALPATION: No pitting   OBSERVATIONS / OTHER ASSESSMENTS: wearing sleeve which looks a bit loose at the top   POSTURE: WNL  UPPER EXTREMITY AROM/PROM: WNL  CERVICAL AROM: All within normal limits:   UPPER EXTREMITY STRENGTH: WNL  LYMPHEDEMA ASSESSMENTS:   LANDMARK LEFT eval  At axilla  31  15 cm proximal to  olecranon process   10 cm proximal to olecranon process   Olecranon process 28.2  15 cm proximal to ulnar styloid process   10  cm proximal to ulnar styloid process   Just proximal to ulnar styloid process 16.3  Across hand at thumb web space   At base of 2nd digit   (Blank rows = not tested)                                                                                                                          TREATMENT DATE:  06/30/23 Brief eval performed - pt mainly wants a new sleeve Happy with current sleeve size and length just would like a new one of the same Entered into [facility]ico with education on self pay vs insurance. Will start off with insurance approval.    PATIENT EDUCATION:  Education details: ordering sleeve Person educated: Patient Education method: Explanation Education comprehension: verbalized understanding  HOME EXERCISE PROGRAM:   ASSESSMENT:  CLINICAL IMPRESSION: Patient is a 63 y.o. female who was seen today for physical therapy evaluation and treatment for her Lt UE chronic stage 2 lymphedema post mastectomy and ALND.  She has used her previous sleeve x 3 years now and is happy with how it is working but can tell it is starting to loosen up a bit.  She still fits in a size 2 long mondi medi espirit. She continues to use her Flexitouch.    OBJECTIVE IMPAIRMENTS: increased edema.   ACTIVITY LIMITATIONS: none  PARTICIPATION LIMITATIONS: none  PERSONAL FACTORS: 1-2 comorbidities: ALND and radiation  are also affecting patient's functional outcome.   REHAB POTENTIAL: Excellent  CLINICAL DECISION MAKING: Stable/uncomplicated  EVALUATION COMPLEXITY: Low  GOALS: Goals reviewed with patient? Yes  SHORT TERM GOALS: Target date: 06/30/23  Pt will get a new compression sleeve ordered to maintain her chronic Lt UE lymphedema  Baseline: Goal status: MET   PLAN:  PT FREQUENCY: one time visit  PT DURATION: 1 week  PLANNED INTERVENTIONS: 16109- OT  Re-Evaluation, [phone]- Orthotic Initial, [id number]- Orthotic/Prosthetic subsequent, and Patient/Family education  PLAN FOR NEXT SESSION: as needed.   Encarnacion Harris, PT 06/30/2023, 10:45 AM

## 2023-06-30 ENCOUNTER — Encounter: Payer: Self-pay | Admitting: Rehabilitation

## 2023-06-30 ENCOUNTER — Ambulatory Visit: Attending: Hematology and Oncology | Admitting: Rehabilitation

## 2023-06-30 DIAGNOSIS — I972 Postmastectomy lymphedema syndrome: Secondary | ICD-10-CM | POA: Diagnosis not present

## 2023-06-30 DIAGNOSIS — C50412 Malignant neoplasm of upper-outer quadrant of left female breast: Secondary | ICD-10-CM | POA: Insufficient documentation

## 2023-06-30 DIAGNOSIS — Z171 Estrogen receptor negative status [ER-]: Secondary | ICD-10-CM | POA: Insufficient documentation

## 2023-07-08 ENCOUNTER — Telehealth: Payer: Self-pay | Admitting: *Deleted

## 2023-07-08 DIAGNOSIS — C50412 Malignant neoplasm of upper-outer quadrant of left female breast: Secondary | ICD-10-CM | POA: Diagnosis not present

## 2023-07-08 NOTE — Telephone Encounter (Signed)
Per MD request RN placed call to pt with recent Guardant Reveal results being negative.  No answer, LVM for pt to return call to the office.

## 2023-07-13 ENCOUNTER — Encounter: Payer: Self-pay | Admitting: Hematology and Oncology

## 2023-08-02 DIAGNOSIS — Z9013 Acquired absence of bilateral breasts and nipples: Secondary | ICD-10-CM | POA: Diagnosis not present

## 2023-08-12 DIAGNOSIS — H2513 Age-related nuclear cataract, bilateral: Secondary | ICD-10-CM | POA: Diagnosis not present

## 2023-08-12 DIAGNOSIS — H40039 Anatomical narrow angle, unspecified eye: Secondary | ICD-10-CM | POA: Diagnosis not present

## 2023-08-13 ENCOUNTER — Encounter: Payer: Self-pay | Admitting: Hematology and Oncology

## 2023-09-03 DIAGNOSIS — H40039 Anatomical narrow angle, unspecified eye: Secondary | ICD-10-CM | POA: Diagnosis not present

## 2023-09-03 DIAGNOSIS — H40033 Anatomical narrow angle, bilateral: Secondary | ICD-10-CM | POA: Diagnosis not present

## 2023-10-01 DIAGNOSIS — M3501 Sicca syndrome with keratoconjunctivitis: Secondary | ICD-10-CM | POA: Diagnosis not present

## 2023-10-01 DIAGNOSIS — H2513 Age-related nuclear cataract, bilateral: Secondary | ICD-10-CM | POA: Diagnosis not present

## 2023-10-01 DIAGNOSIS — H40039 Anatomical narrow angle, unspecified eye: Secondary | ICD-10-CM | POA: Diagnosis not present

## 2023-12-02 ENCOUNTER — Encounter: Payer: Self-pay | Admitting: Rehabilitation

## 2023-12-06 ENCOUNTER — Encounter: Payer: Self-pay | Admitting: Oncology

## 2023-12-20 ENCOUNTER — Encounter: Payer: Self-pay | Admitting: Physician Assistant

## 2023-12-20 ENCOUNTER — Ambulatory Visit (INDEPENDENT_AMBULATORY_CARE_PROVIDER_SITE_OTHER): Payer: BC Managed Care – PPO | Admitting: Physician Assistant

## 2023-12-20 VITALS — BP 118/80 | HR 71 | Temp 97.8°F | Resp 18 | Ht 63.0 in | Wt 152.2 lb

## 2023-12-20 DIAGNOSIS — R3121 Asymptomatic microscopic hematuria: Secondary | ICD-10-CM

## 2023-12-20 DIAGNOSIS — N959 Unspecified menopausal and perimenopausal disorder: Secondary | ICD-10-CM

## 2023-12-20 DIAGNOSIS — Z0000 Encounter for general adult medical examination without abnormal findings: Secondary | ICD-10-CM

## 2023-12-20 NOTE — Progress Notes (Signed)
 Subjective:  Patient ID: Kathryn [name], female    DOB: 1960-05-18  Age: 63 y.o. MRN: [id number]  Chief Complaint  Patient presents with   Annual Exam    HPI Well Adult Physical: Patient here for a comprehensive physical exam.The patient reports no problems Do you take any herbs or supplements that were not prescribed by a doctor? no Are you taking calcium supplements? no Are you taking aspirin daily? no  Encounter for general adult medical examination without abnormal findings  Physical (At Risk items are starred): Patient's last physical exam was 1 year ago .  Patient is not afflicted from Stress Incontinence and Urge Incontinence  Patient wears a seat belts Patient has smoke detectors and has carbon monoxide detectors. Patient practices appropriate gun safety. Patient wears sunscreen with extended sun exposure. Dental Care: brushes and flosses daily. Last dental visit: up to date Vision impairments: wears glasses Ophthalmology/Optometry: Annual visit.  Hearing loss: none  No LMP recorded. Patient is postmenopausal. Safe at home: Yes Self breast exams: Yes Last pap: up to date Last mammogram: NA - bilateral mastectomy 2022     12/20/2023    8:28 AM 12/16/2022   10:19 AM 02/20/2021   10:17 AM 11/12/2020    2:05 PM  Depression screen PHQ 2/9  Decreased Interest 0 0 0 0  Down, Depressed, Hopeless 0 0 0 0  PHQ - 2 Score 0 0 0 0  Altered sleeping  0    Tired, decreased energy  1    Change in appetite  1    Feeling bad or failure about yourself   0    Trouble concentrating  0    Moving slowly or fidgety/restless  0    Suicidal thoughts  0    PHQ-9 Score  2    Difficult doing work/chores  Not difficult at all           12/05/2020    1:32 PM 06/12/2021   10:00 AM 12/15/2021    3:00 PM 12/16/2022   10:18 AM 12/20/2023    8:28 AM  Fall Risk  Falls in the past year?    0 0  Was there an injury with Fall?    0 0  Fall Risk Category Calculator    0 0  (RETIRED)  Patient Fall Risk Level Low fall risk  Low fall risk  Low fall risk     Patient at Risk for Falls Due to    No Fall Risks No Fall Risks  Fall risk Follow up    Falls evaluation completed Falls evaluation completed     Data saved with a previous flowsheet row definition             Social Hx   Social History   Socioeconomic History   Marital status: Married    Spouse name: Not on file   Number of children: Not on file   Years of education: Not on file   Highest education level: Some college, no degree  Occupational History   Not on file  Tobacco Use   Smoking status: Never   Smokeless tobacco: Never  Vaping Use   Vaping status: Never Used  Substance and Sexual Activity   Alcohol use: Yes    Comment: I may have a drink every few months   Drug use: Never   Sexual activity: Yes    Birth control/protection: Surgical    Comment: Tubes tied  Other Topics Concern   Not on file  Social History Narrative   Not on file   Social Drivers of Health   Financial Resource Strain: Low Risk  (12/20/2023)   Overall Financial Resource Strain (CARDIA)    Difficulty of Paying Living Expenses: Not hard at all  Food Insecurity: No Food Insecurity (12/20/2023)   Hunger Vital Sign    Worried About Running Out of Food in the Last Year: Never true    Ran Out of Food in the Last Year: Never true  Transportation Needs: No Transportation Needs (12/20/2023)   PRAPARE - Administrator, Civil Service (Medical): No    Lack of Transportation (Non-Medical): No  Physical Activity: Insufficiently Active (12/20/2023)   Exercise Vital Sign    Days of Exercise per Week: 3 days    Minutes of Exercise per Session: 30 min  Stress: No Stress Concern Present (12/20/2023)   [employer] of Occupational Health - Occupational Stress Questionnaire    Feeling of Stress: Not at all  Social Connections: Moderately Integrated (12/20/2023)   Social Connection and Isolation Panel    Frequency of  Communication with Friends and Family: More than three times a week    Frequency of Social Gatherings with Friends and Family: Three times a week    Attends Religious Services: More than 4 times per year    Active Member of Clubs or Organizations: No    Attends Banker Meetings: Never    Marital Status: Married   Past Medical History:  Diagnosis Date   Anemia    during chemo   Breast cancer (HCC)    stage 3 - left   Family history of kidney cancer    Family history of melanoma    GERD (gastroesophageal reflux disease)    resolved   Headache    hormone related, none since 1st child was born    Hyperlipidemia    Osteoporosis    Personal history of chemotherapy    Personal history of radiation therapy    Pleurisy    Past Surgical History:  Procedure Laterality Date   AXILLARY LYMPH NODE DISSECTION Left 10/11/2018   Procedure: LEFT AXILLARY LYMPH NODE DISSECTION;  Surgeon: Vanderbilt Ned, MD;  Location: [facility];  Service: General;  Laterality: Left;   BREAST LUMPECTOMY Left 08/2018   BREAST LUMPECTOMY WITH RADIOACTIVE SEED AND SENTINEL LYMPH NODE BIOPSY Left 09/23/2018   Procedure: LEFT BREAST LUMPECTOMY WITH RADIOACTIVE SEED AND LEFT AXILLARY TARGETED LYMPH NODE BIOPY AND  LEFT AXILLARY SENTINEL LYMPH NODE MAPPING;  Surgeon: Vanderbilt Ned, MD;  Location: [facility];  Service: General;  Laterality: Left;   CESAREAN SECTION     x3   FRACTURE SURGERY  2020   humerus - w/ Franky HERO. Supple, MD   ORIF HUMERUS FRACTURE Right 01/31/2019   Procedure: OPEN REDUCTION INTERNAL FIXATION (ORIF) Right 3 part proximal humerus fracture;  Surgeon: Melita Franky, MD;  Location: [facility];  Service: Orthopedics;  Laterality: Right;    PORT-A-CATH REMOVAL Right 07/18/2019   Procedure: REMOVAL PORT-A-CATH;  Surgeon: Vanderbilt Ned, MD;  Location: [facility];  Service: General;  Laterality: Right;   PORTACATH PLACEMENT N/A 04/28/2018   Procedure:  INSERTION PORT-A-CATH WITH ULTRASOUND;  Surgeon: Vanderbilt Ned, MD;  Location: [facility];  Service: General;  Laterality: N/A;   PORTACATH PLACEMENT N/A 04/25/2020   Procedure: INSERTION PORT-A-CATH;  Surgeon: Vanderbilt Ned, MD;  Location: [facility];  Service: General;  Laterality: N/A;  60 MIN IN LDOW PLEASE   SENTINEL  NODE BIOPSY Left 07/30/2020   Procedure: LEFT SENTINEL NODE BIOPSY;  Surgeon: Vanderbilt Ned, MD;  Location: [facility];  Service: General;  Laterality: Left;   SIMPLE MASTECTOMY WITH AXILLARY SENTINEL NODE BIOPSY Bilateral 07/30/2020   Procedure: BILATERAL SIMPLE MASTECTOMY;  Surgeon: Vanderbilt Ned, MD;  Location: [facility];  Service: General;  Laterality: Bilateral;   TUBAL LIGATION      Family History  Problem Relation Age of Onset   Arthritis Mother    Vision loss Mother    Colon polyps Father    COPD Father    Diabetes Father    Heart disease Father    Kidney cancer Sister 34       d. 19   Cancer Sister    Melanoma Sister    Melanoma Sister    Lung cancer Paternal Uncle    Colon cancer Neg Hx    Esophageal cancer Neg Hx    Stomach cancer Neg Hx    Rectal cancer Neg Hx     ROS CONSTITUTIONAL: Negative for chills, fatigue, fever, unintentional weight gain and unintentional weight loss.  E/N/T: Negative for ear pain, nasal congestion and sore throat.  CARDIOVASCULAR: Negative for chest pain, dizziness, palpitations and pedal edema.  RESPIRATORY: Negative for recent cough and dyspnea.  GASTROINTESTINAL: Negative for abdominal pain, acid reflux symptoms, constipation, diarrhea, nausea and vomiting.  MSK: Negative for arthralgias and myalgias.  INTEGUMENTARY: Negative for rash.  NEUROLOGICAL: Negative for dizziness and headaches.  PSYCHIATRIC: Negative for sleep disturbance and to question depression screen.  Negative for depression, negative for anhedonia.   Objective:  PHYSICAL EXAM:   BP 118/80   Pulse 71   Temp 97.8 F (36.6 C) (Temporal)   Resp 18   Ht 5' 3  (1.6 m)   Wt 152 lb 3.2 oz (69 kg)   SpO2 100%   BMI 26.96 kg/m   Vision Screening   Right eye Left eye Both eyes  Without correction     With correction 20/30 20/30 20/20     GEN: Well nourished, well developed, in no acute distress  HEENT: normal external ears and nose - normal external auditory canals and TMS - hearing grossly normal -  - Lips, Teeth and Gums - normal  Oropharynx - normal mucosa, palate, and posterior pharynx Neck: no JVD or masses - no thyromegaly Cardiac: RRR; no murmurs, rubs, or gallops,no edema - no significant varicosities Respiratory:  normal respiratory rate and pattern with no distress - normal breath sounds with no rales, rhonchi, wheezes or rubs GI: normal bowel sounds, no masses or tenderness MS: no deformity or atrophy  Skin: warm and dry, no rash  Neuro:  Alert and Oriented x 3, Strength and sensation are intact - CN II-Xii grossly intact Psych: euthymic mood, appropriate affect and demeanor Pt not able to get ua Assessment & Plan:  Annual physical exam -     CBC with Differential/Platelet -     Comprehensive metabolic panel with GFR -     TSH -     Lipid panel -     VITAMIN D  25 Hydroxy (Vit-D Deficiency, Fractures) Education given Recommend continue to work on eating healthy diet and exercise.  Asymptomatic microscopic hematuria Pt not able to get ua - will have follow up with urology in 1-2 months   This is a list of the screening recommended for you and due dates:  Health Maintenance  Topic Date Due   Zoster (Shingles) Vaccine (1 of 2) 03/21/2024*  Flu Shot  05/23/2024*   Pneumococcal Vaccine for age over 45 (1 of 1 - PCV) 12/19/2024*   Pap with HPV screening  02/20/2026   DTaP/Tdap/Td vaccine (2 - Td or Tdap) 02/21/2031   Colon Cancer Screening  05/17/2033   Hepatitis B Vaccine  Aged Out   HPV Vaccine  Aged Out   Meningitis B Vaccine  Aged Out   Breast Cancer Screening  Discontinued   COVID-19 Vaccine  Discontinued   Hepatitis  C Screening  Discontinued   HIV Screening  Discontinued  *Topic was postponed. The date shown is not the original due date.     Follow-up: Return in about 1 year (around 12/19/2024), or fasting physical.  An After Visit Summary was printed and given to the patient.  [name] [phone]

## 2023-12-21 ENCOUNTER — Ambulatory Visit: Payer: Self-pay | Admitting: Physician Assistant

## 2023-12-21 ENCOUNTER — Encounter: Payer: Self-pay | Admitting: *Deleted

## 2023-12-21 LAB — CBC WITH DIFFERENTIAL/PLATELET
Basophils Absolute: 0 x10E3/uL (ref 0.0–0.2)
Basos: 1 %
EOS (ABSOLUTE): 0.1 x10E3/uL (ref 0.0–0.4)
Eos: 1 %
Hematocrit: 39.8 % (ref 34.0–46.6)
Hemoglobin: 13 g/dL (ref 11.1–15.9)
Immature Grans (Abs): 0 x10E3/uL (ref 0.0–0.1)
Immature Granulocytes: 0 %
Lymphocytes Absolute: 1.8 x10E3/uL (ref 0.7–3.1)
Lymphs: 36 %
MCH: 30.9 pg (ref 26.6–33.0)
MCHC: 32.7 g/dL (ref 31.5–35.7)
MCV: 95 fL (ref 79–97)
Monocytes Absolute: 0.5 x10E3/uL (ref 0.1–0.9)
Monocytes: 10 %
Neutrophils Absolute: 2.7 x10E3/uL (ref 1.4–7.0)
Neutrophils: 51 %
Platelets: 235 x10E3/uL (ref 150–450)
RBC: 4.21 x10E6/uL (ref 3.77–5.28)
RDW: 12.8 % (ref 11.7–15.4)
WBC: 5.2 x10E3/uL (ref 3.4–10.8)

## 2023-12-21 LAB — COMPREHENSIVE METABOLIC PANEL WITH GFR
ALT: 15 IU/L (ref 0–32)
AST: 18 IU/L (ref 0–40)
Albumin: 4.5 g/dL (ref 3.9–4.9)
Alkaline Phosphatase: 90 IU/L (ref 49–135)
BUN/Creatinine Ratio: 23 (ref 12–28)
BUN: 15 mg/dL (ref 8–27)
Bilirubin Total: 0.6 mg/dL (ref 0.0–1.2)
CO2: 22 mmol/L (ref 20–29)
Calcium: 9.3 mg/dL (ref 8.7–10.3)
Chloride: 102 mmol/L (ref 96–106)
Creatinine, Ser: 0.66 mg/dL (ref 0.57–1.00)
Globulin, Total: 2.4 g/dL (ref 1.5–4.5)
Glucose: 85 mg/dL (ref 70–99)
Potassium: 4.4 mmol/L (ref 3.5–5.2)
Sodium: 138 mmol/L (ref 134–144)
Total Protein: 6.9 g/dL (ref 6.0–8.5)
eGFR: 100 mL/min/1.73 (ref >59)

## 2023-12-21 LAB — VITAMIN D 25 HYDROXY (VIT D DEFICIENCY, FRACTURES): Vit D, 25-Hydroxy: 29.2 ng/mL — ABNORMAL LOW (ref 30.0–100.0)

## 2023-12-21 LAB — LIPID PANEL
Chol/HDL Ratio: 3.2 ratio (ref 0.0–4.4)
Cholesterol, Total: 230 mg/dL — ABNORMAL HIGH (ref 100–199)
HDL: 71 mg/dL (ref >39)
LDL Chol Calc (NIH): 148 mg/dL — ABNORMAL HIGH (ref 0–99)
Triglycerides: 67 mg/dL (ref 0–149)
VLDL Cholesterol Cal: 11 mg/dL (ref 5–40)

## 2023-12-21 LAB — TSH: TSH: 1.36 u[IU]/mL (ref 0.450–4.500)

## 2023-12-21 NOTE — Progress Notes (Signed)
 [employer] [employer] [facility] orders placed via their portal.

## 2024-01-04 ENCOUNTER — Ambulatory Visit (INDEPENDENT_AMBULATORY_CARE_PROVIDER_SITE_OTHER)
Admission: RE | Admit: 2024-01-04 | Discharge: 2024-01-04 | Disposition: A | Discharge status: Home / Self Care | Source: Ambulatory Visit | Attending: Physician Assistant | Admitting: Physician Assistant

## 2024-01-04 DIAGNOSIS — N959 Unspecified menopausal and perimenopausal disorder: Secondary | ICD-10-CM

## 2024-01-04 DIAGNOSIS — Z13820 Encounter for screening for osteoporosis: Secondary | ICD-10-CM

## 2024-01-04 DIAGNOSIS — M810 Age-related osteoporosis without current pathological fracture: Secondary | ICD-10-CM | POA: Diagnosis not present

## 2024-01-04 DIAGNOSIS — Z780 Asymptomatic menopausal state: Secondary | ICD-10-CM | POA: Diagnosis not present

## 2024-01-12 ENCOUNTER — Encounter: Payer: Self-pay | Admitting: Rehabilitation

## 2024-01-14 ENCOUNTER — Encounter: Payer: Self-pay | Admitting: Hematology and Oncology

## 2024-02-23 ENCOUNTER — Encounter: Payer: Self-pay | Admitting: Physician Assistant

## 2024-02-28 ENCOUNTER — Encounter: Payer: Self-pay | Admitting: Physician Assistant

## 2024-03-08 ENCOUNTER — Encounter: Payer: Self-pay | Admitting: Oncology

## 2024-06-19 ENCOUNTER — Ambulatory Visit: Admitting: Hematology and Oncology
